# Patient Record
Sex: Female | Born: 1960
Health system: Southern US, Community
[De-identification: ages and names within clinical notes are randomized; demographics above are authoritative.]

## PROBLEM LIST (undated history)

## (undated) ENCOUNTER — Emergency Department (HOSPITAL_COMMUNITY): Admission: EM | Payer: No Typology Code available for payment source | Source: Home / Self Care

## (undated) DIAGNOSIS — I499 Cardiac arrhythmia, unspecified: Secondary | ICD-10-CM

## (undated) DIAGNOSIS — I480 Paroxysmal atrial fibrillation: Secondary | ICD-10-CM

## (undated) DIAGNOSIS — G4733 Obstructive sleep apnea (adult) (pediatric): Secondary | ICD-10-CM

## (undated) DIAGNOSIS — I4819 Other persistent atrial fibrillation: Secondary | ICD-10-CM

## (undated) DIAGNOSIS — Z720 Tobacco use: Secondary | ICD-10-CM

## (undated) DIAGNOSIS — K5792 Diverticulitis of intestine, part unspecified, without perforation or abscess without bleeding: Secondary | ICD-10-CM

## (undated) DIAGNOSIS — Z8616 Personal history of COVID-19: Secondary | ICD-10-CM

## (undated) HISTORY — DX: Other persistent atrial fibrillation: I48.19

## (undated) HISTORY — PX: ABDOMINAL HYSTERECTOMY: SHX81

## (undated) HISTORY — DX: Obstructive sleep apnea (adult) (pediatric): G47.33

## (undated) HISTORY — DX: Diverticulitis of intestine, part unspecified, without perforation or abscess without bleeding: K57.92

---

## 1999-06-07 ENCOUNTER — Emergency Department (HOSPITAL_COMMUNITY): Admission: EM | Admit: 1999-06-07 | Discharge: 1999-06-07 | Payer: Self-pay | Admitting: Emergency Medicine

## 2001-03-28 ENCOUNTER — Encounter: Payer: Self-pay | Admitting: Family Medicine

## 2001-03-28 ENCOUNTER — Ambulatory Visit (HOSPITAL_COMMUNITY): Admission: RE | Admit: 2001-03-28 | Discharge: 2001-03-28 | Payer: Self-pay | Admitting: Family Medicine

## 2004-01-15 ENCOUNTER — Emergency Department (HOSPITAL_COMMUNITY): Admission: EM | Admit: 2004-01-15 | Discharge: 2004-01-15 | Payer: Self-pay | Admitting: Emergency Medicine

## 2004-12-05 ENCOUNTER — Emergency Department (HOSPITAL_COMMUNITY): Admission: EM | Admit: 2004-12-05 | Discharge: 2004-12-05 | Payer: Self-pay | Admitting: Emergency Medicine

## 2007-10-22 ENCOUNTER — Emergency Department (HOSPITAL_COMMUNITY): Admission: EM | Admit: 2007-10-22 | Discharge: 2007-10-22 | Payer: Self-pay | Admitting: Emergency Medicine

## 2008-02-12 ENCOUNTER — Emergency Department (HOSPITAL_COMMUNITY): Admission: EM | Admit: 2008-02-12 | Discharge: 2008-02-12 | Payer: Self-pay | Admitting: Family Medicine

## 2011-02-16 LAB — CULTURE, ROUTINE-ABSCESS: Gram Stain: NONE SEEN

## 2011-06-12 ENCOUNTER — Encounter (HOSPITAL_COMMUNITY): Payer: Self-pay

## 2011-06-12 ENCOUNTER — Emergency Department (HOSPITAL_COMMUNITY)
Admission: EM | Admit: 2011-06-12 | Discharge: 2011-06-12 | Disposition: A | Discharge status: Home / Self Care | Payer: Self-pay | Attending: Emergency Medicine | Admitting: Emergency Medicine

## 2011-06-12 DIAGNOSIS — H9209 Otalgia, unspecified ear: Secondary | ICD-10-CM | POA: Insufficient documentation

## 2011-06-12 DIAGNOSIS — R599 Enlarged lymph nodes, unspecified: Secondary | ICD-10-CM | POA: Insufficient documentation

## 2011-06-12 DIAGNOSIS — H9190 Unspecified hearing loss, unspecified ear: Secondary | ICD-10-CM | POA: Insufficient documentation

## 2011-06-12 NOTE — ED Notes (Signed)
Pt reports right ear is stopped up after using a q-tip last night. Reports used OTC rinse to try to unclog ear, but had no relief. Reports headache and pain in right ear.

## 2011-06-12 NOTE — ED Provider Notes (Signed)
History     CSN: [id number]  Arrival date & time 06/12/11  1237   First MD Initiated Contact with Patient 06/12/11 1303      Chief Complaint  Patient presents with  . Ear Fullness    (Consider location/radiation/quality/duration/timing/severity/associated sxs/prior treatment) Patient is a 51 y.o. female presenting with plugged ear sensation. The history is provided by the patient. No language interpreter was used.  Ear Fullness This is a new problem. The current episode started yesterday. Associated symptoms include swollen glands. Pertinent negatives include no nausea, vertigo or vomiting.   S. report she was cleaning her ear with Q-tip last night and all of a sudden the right ear became clogged in her hearing is decreased. Denies pain fever or drainage or vertigo.  History reviewed. No pertinent past medical history.  Past Surgical History  Procedure Date  . Abdominal hysterectomy     Family History  Problem Relation Age of Onset  . Cancer Mother   . Heart failure Mother   . Cancer Father   . Heart failure Father     History  Substance Use Topics  . Smoking status: Current Everyday Smoker  . Smokeless tobacco: Not on file  . Alcohol Use: No    OB History    Grav Para Term Preterm Abortions TAB SAB Ect Mult Living                  Review of Systems  Gastrointestinal: Negative for nausea and vomiting.  Neurological: Negative for vertigo.  All other systems reviewed and are negative.    Allergies  Review of patient's allergies indicates no known allergies.  Home Medications  No current outpatient prescriptions on file.  BP 123/69  Pulse 69  Temp(Src) 97.8 F (36.6 C) (Oral)  Resp 14  SpO2 98%  Physical Exam  Nursing note and vitals reviewed. Constitutional: She is oriented to person, place, and time. She appears well-developed and well-nourished.  HENT:  Head: Normocephalic and atraumatic.  Right Ear: No drainage, swelling or tenderness. No  foreign bodies. No mastoid tenderness. Tympanic membrane is not perforated. No middle ear effusion. Decreased hearing is noted.  Left Ear: Tympanic membrane normal.  Eyes: Conjunctivae and EOM are normal. Pupils are equal, round, and reactive to light.  Neck: Normal range of motion. Neck supple.  Cardiovascular: Normal rate, regular rhythm, normal heart sounds and intact distal pulses.  Exam reveals no gallop and no friction rub.   No murmur heard. Pulmonary/Chest: Effort normal and breath sounds normal.  Abdominal: Soft. Bowel sounds are normal.  Musculoskeletal: Normal range of motion. She exhibits no edema and no tenderness.  Neurological: She is alert and oriented to person, place, and time. She has normal reflexes.  Skin: Skin is warm and dry.  Psychiatric: She has a normal mood and affect.    ED Course  Procedures (including critical care time)  Labs Reviewed - No data to display No results found.   No diagnosis found.    MDM  Cleaning her ear with q-tip last night and lost her hearing. NO drainage, fever, or tenderness.  Wax was removed from the ear in the ER and her hearing was restored.  Did not fully visualize the R TM but the fact that her hearing is restored I doubt there is a punctured TM.         Jethro Bastos, NP 06/13/11 [phone]

## 2011-06-12 NOTE — ED Notes (Signed)
Patient presents with right ear "clogged" since last night after inserting a q-tip.  Patient also reporting mild right ear pain.

## 2011-06-13 NOTE — ED Provider Notes (Signed)
Medical screening examination/treatment/procedure(s) were performed by non-physician practitioner and as supervising physician I was immediately available for consultation/collaboration.  Doug Sou, MD 06/13/11 [phone]

## 2012-03-21 ENCOUNTER — Emergency Department (HOSPITAL_COMMUNITY): Payer: PRIVATE HEALTH INSURANCE

## 2012-03-21 ENCOUNTER — Emergency Department (HOSPITAL_COMMUNITY)
Admission: EM | Admit: 2012-03-21 | Discharge: 2012-03-21 | Disposition: A | Discharge status: Home / Self Care | Payer: PRIVATE HEALTH INSURANCE | Attending: Emergency Medicine | Admitting: Emergency Medicine

## 2012-03-21 ENCOUNTER — Encounter (HOSPITAL_COMMUNITY): Payer: Self-pay | Admitting: Emergency Medicine

## 2012-03-21 DIAGNOSIS — F17200 Nicotine dependence, unspecified, uncomplicated: Secondary | ICD-10-CM | POA: Insufficient documentation

## 2012-03-21 DIAGNOSIS — M25559 Pain in unspecified hip: Secondary | ICD-10-CM | POA: Insufficient documentation

## 2012-03-21 MED ORDER — OXYCODONE-ACETAMINOPHEN 5-325 MG PO TABS: 2.0000 | ORAL_TABLET | ORAL | Status: DC | PRN

## 2012-03-21 MED ORDER — OXYCODONE-ACETAMINOPHEN 5-325 MG PO TABS
1.0000 | ORAL_TABLET | Freq: Once | ORAL | Status: AC
  Administered 2012-03-21: 1 via ORAL
  Filled 2012-03-21: qty 1

## 2012-03-21 MED ORDER — MELOXICAM 15 MG PO TABS: 15.0000 mg | ORAL_TABLET | Freq: Every day | ORAL | Status: DC

## 2012-03-21 NOTE — ED Provider Notes (Signed)
History     CSN: [id number]  Arrival date & time 03/21/12  1200   None     Chief Complaint  Patient presents with  . Hip Pain    (Consider location/radiation/quality/duration/timing/severity/associated sxs/prior treatment) Patient is a 51 y.o. female presenting with hip pain. The history is provided by the patient. No language interpreter was used.  Hip Pain This is a chronic problem. The current episode started more than 1 month ago. The problem occurs daily. The problem has been gradually worsening. Pertinent negatives include no nausea or vomiting. The symptoms are aggravated by walking. She has tried NSAIDs and acetaminophen for the symptoms. The treatment provided mild relief.   51 year old female coming in with right hip pain that radiates to her right thigh. Patient states that she has had this pain for over a month. States the pain is worse at the end of this day and she's been working and standing on all day. Face is sitting down makes the pain better. States she gets mild relief from Tylenol and Motrin. No acute distress noted. Patient walking without a limp. Assessment medical history listed below. States she has insurance now so she needs a PCP provider list.   History reviewed. No pertinent past medical history.  Past Surgical History  Procedure Date  . Abdominal hysterectomy     Family History  Problem Relation Age of Onset  . Cancer Mother   . Heart failure Mother   . Cancer Father   . Heart failure Father     History  Substance Use Topics  . Smoking status: Current Every Day Smoker    Types: Cigarettes  . Smokeless tobacco: Not on file  . Alcohol Use: No    OB History    Grav Para Term Preterm Abortions TAB SAB Ect Mult Living                  Review of Systems  Constitutional: Negative.   HENT: Negative.   Eyes: Negative.   Respiratory: Negative.   Cardiovascular: Negative.   Gastrointestinal: Negative.  Negative for nausea and vomiting.    Musculoskeletal:       R posterior hip pain that radiates to R thigh intermittant  Neurological: Negative.   Psychiatric/Behavioral: Negative.   All other systems reviewed and are negative.    Allergies  Review of patient's allergies indicates no known allergies.  Home Medications   Current Outpatient Rx  Name  Route  Sig  Dispense  Refill  . IBUPROFEN 800 MG PO TABS   Oral   Take 800 mg by mouth every 8 (eight) hours as needed. pain           BP 132/81  Pulse 65  Temp 98.5 F (36.9 C) (Oral)  Resp 16  Ht 5\' 8"  (1.727 m)  Wt 240 lb (108.863 kg)  BMI 36.49 kg/m2  SpO2 99%  Physical Exam  Nursing note and vitals reviewed. Constitutional: She is oriented to person, place, and time. She appears well-developed and well-nourished.  HENT:  Head: Normocephalic and atraumatic.  Eyes: Conjunctivae normal and EOM are normal. Pupils are equal, round, and reactive to light.  Neck: Normal range of motion. Neck supple.  Cardiovascular: Normal rate, regular rhythm, normal heart sounds and intact distal pulses.  Exam reveals no gallop and no friction rub.   No murmur heard. Pulmonary/Chest: Effort normal and breath sounds normal.  Abdominal: Soft. Bowel sounds are normal.  Musculoskeletal: Normal range of motion. She exhibits no edema  and no tenderness.       Pain with palpitation to the right posterior hip and right lateral hip. Causes CMS below the pain patient walking without a Limp  Neurological: She is alert and oriented to person, place, and time. She has normal reflexes.  Skin: Skin is warm and dry.  Psychiatric: She has a normal mood and affect.    ED Course  Procedures (including critical care time)  Labs Reviewed - No data to display Dg Hip Complete Right  03/21/2012  *RADIOLOGY REPORT*  Clinical Data: Right hip pain radiating to knee  RIGHT HIP - COMPLETE 2+ VIEW  Comparison: None  Findings: Osseous demineralization. Hip and SI joints symmetric and preserved. No  acute fracture, dislocation, or bone destruction.  IMPRESSION: Osseous demineralization. No acute osseous abnormalities.   Original Report Authenticated By: Ulyses Southward, M.D.    Dg Femur Right  03/21/2012  *RADIOLOGY REPORT*  Clinical Data: 51 year old female right hip pain radiating distally.  RIGHT FEMUR - 2 VIEW  Comparison: None.  Findings: Right femoral head normally located.  Right hip joint space preserved.  Right proximal femur intact.  Visualized right hemi pelvis intact.  Distal right femur intact.  Grossly normal alignment of the right knee.  IMPRESSION: No acute fracture or dislocation identified about the right femur.   Original Report Authenticated By: Erskine Speed, M.D.      No diagnosis found.    MDM  51 yo female with R posterior hip pain with palpatation and fatigue.  Will treat with mobic today and percocet.  Patient will get a pcp to follow up with.  No acute process on hip and femur film reviewed by myself.  Suspect osteoarthritis.           Remi Haggard, NP 03/21/12 1536

## 2012-03-21 NOTE — ED Notes (Signed)
Per [employer], pt was not c/o of RLE pain but is c/o R thigh pain.  Xray order changed.

## 2012-03-21 NOTE — ED Notes (Signed)
Pt presenting to ed with c/o right hip pain x 1 month pt denies injury at this time. Pt states she is having tingling and some numbness and burning in her right hip and leg

## 2012-03-21 NOTE — ED Provider Notes (Signed)
Medical screening examination/treatment/procedure(s) were performed by non-physician practitioner and as supervising physician I was immediately available for consultation/collaboration.   Blanchie Dessert, MD 03/21/12 2303

## 2012-03-21 NOTE — ED Notes (Signed)
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 Pt get radiating pain from right upper buttocks around to front of hip then down right thigh and becomes numb in a circular area.  Pain has been occuring for over a month but has become very severe when standing for more than 15 minutes since this past weekend. Pt has been taking OTC meds for pain and inflammation with no relief.

## 2015-03-31 ENCOUNTER — Emergency Department (HOSPITAL_COMMUNITY): Payer: Self-pay

## 2015-03-31 ENCOUNTER — Emergency Department (HOSPITAL_COMMUNITY)
Admission: EM | Admit: 2015-03-31 | Discharge: 2015-03-31 | Disposition: A | Discharge status: Home / Self Care | Payer: Self-pay | Attending: Physician Assistant | Admitting: Physician Assistant

## 2015-03-31 ENCOUNTER — Encounter (HOSPITAL_COMMUNITY): Payer: Self-pay | Admitting: Emergency Medicine

## 2015-03-31 DIAGNOSIS — J40 Bronchitis, not specified as acute or chronic: Secondary | ICD-10-CM

## 2015-03-31 DIAGNOSIS — F17210 Nicotine dependence, cigarettes, uncomplicated: Secondary | ICD-10-CM | POA: Insufficient documentation

## 2015-03-31 DIAGNOSIS — R5383 Other fatigue: Secondary | ICD-10-CM | POA: Insufficient documentation

## 2015-03-31 DIAGNOSIS — R079 Chest pain, unspecified: Secondary | ICD-10-CM | POA: Insufficient documentation

## 2015-03-31 DIAGNOSIS — J209 Acute bronchitis, unspecified: Secondary | ICD-10-CM | POA: Insufficient documentation

## 2015-03-31 MED ORDER — ALBUTEROL SULFATE HFA 108 (90 BASE) MCG/ACT IN AERS
2.0000 | INHALATION_SPRAY | Freq: Once | RESPIRATORY_TRACT | Status: AC
  Administered 2015-03-31: 2 via RESPIRATORY_TRACT
  Filled 2015-03-31: qty 6.7

## 2015-03-31 MED ORDER — AZITHROMYCIN 250 MG PO TABS: 250.0000 mg | ORAL_TABLET | Freq: Once | ORAL | Status: DC

## 2015-03-31 MED ORDER — AZITHROMYCIN 250 MG PO TABS
500.0000 mg | ORAL_TABLET | Freq: Once | ORAL | Status: AC
  Administered 2015-03-31: 500 mg via ORAL
  Filled 2015-03-31: qty 2

## 2015-03-31 MED ORDER — IPRATROPIUM-ALBUTEROL 0.5-2.5 (3) MG/3ML IN SOLN
3.0000 mL | Freq: Once | RESPIRATORY_TRACT | Status: AC
  Administered 2015-03-31: 3 mL via RESPIRATORY_TRACT
  Filled 2015-03-31: qty 3

## 2015-03-31 NOTE — ED Notes (Signed)
Pt to CXR.

## 2015-03-31 NOTE — ED Provider Notes (Signed)
CSN: [id number]     Arrival date & time 03/31/15  1005 History   First MD Initiated Contact with Patient 03/31/15 1103     Chief Complaint  Patient presents with  . Cough  . Shortness of Breath     (Consider location/radiation/quality/duration/timing/severity/associated sxs/prior Treatment) HPI   Patient is a 54 year old female who is in her usual state of health until 9 days ago when she started with cough. Patient reports no fever. Patient reports she's had cough, congestion. The cough has kept her up at night some. Patient denies any nausea vomiting or diarrhea.  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 She came here to get checked out to make sure that she does not require any antibiotics.  History reviewed. No pertinent past medical history. Past Surgical History  Procedure Laterality Date  . Abdominal hysterectomy     Family History  Problem Relation Age of Onset  . Cancer Mother   . Heart failure Mother   . Cancer Father   . Heart failure Father    Social History  Substance Use Topics  . Smoking status: Current Every Day Smoker    Types: Cigarettes  . Smokeless tobacco: None  . Alcohol Use: No   OB History    No data available     Review of Systems  Constitutional: Positive for fatigue. Negative for activity change.  HENT: Positive for congestion.   Eyes: Negative for discharge.  Respiratory: Positive for cough and chest tightness.   Cardiovascular: Negative for chest pain and palpitations.  Gastrointestinal: Negative for abdominal distention.  Genitourinary: Negative for dysuria.  Musculoskeletal: Negative for joint swelling.  Skin: Negative for rash.  Allergic/Immunologic: Negative for immunocompromised state.  Neurological: Negative for dizziness.  Psychiatric/Behavioral: Negative for agitation.      Allergies  Review of patient's allergies indicates no known allergies.  Home Medications   Prior to  Admission medications   Medication Sig Start Date End Date Taking? Authorizing Provider  acetaminophen (TYLENOL) 500 MG tablet Take 500 mg by mouth every 6 (six) hours as needed for mild pain.   Yes Historical Provider, MD  guaiFENesin (Q-TUSSIN) 100 MG/5ML liquid Take 200 mg by mouth 3 (three) times daily as needed for cough.   Yes Historical Provider, MD  ibuprofen (ADVIL,MOTRIN) 800 MG tablet Take 800 mg by mouth every 8 (eight) hours as needed. pain   Yes Historical Provider, MD  azithromycin (ZITHROMAX Z-PAK) 250 MG tablet Take 1 tablet (250 mg total) by mouth once. 03/31/15   Zeena Starkel Lyn Leah Skora, MD   BP 120/74 mmHg  Pulse 65  Temp(Src) 98.4 F (36.9 C) (Oral)  Resp 16  SpO2 96% Physical Exam  Constitutional: She is oriented to person, place, and time. She appears well-developed and well-nourished.  HENT:  Head: Normocephalic and atraumatic.  Eyes: Conjunctivae are normal. Right eye exhibits no discharge.  Neck: Neck supple.  Cardiovascular: Normal rate, regular rhythm and normal heart sounds.   No murmur heard. Pulmonary/Chest: Effort normal. She has wheezes. She has no rales.  Wheezing and rhonchi on exam.  Abdominal: Soft. She exhibits no distension. There is no tenderness.  Musculoskeletal: Normal range of motion. She exhibits no edema.  Neurological: She is oriented to person, place, and time. No cranial nerve deficit.  Skin: Skin is warm and dry. No rash noted. She is not diaphoretic.  Psychiatric: She has a normal mood and affect. Her behavior is normal.  Nursing note and vitals  reviewed.   ED Course  Procedures (including critical care time) Labs Review Labs Reviewed - No data to display  Imaging Review Dg Chest 2 View  03/31/2015  CLINICAL DATA:  Cough for 9 days with intermittent fever. Initial encounter. EXAM: CHEST  2 VIEW COMPARISON:  PA and lateral chest 01/15/2004. FINDINGS: There is some peribronchial thickening. No focal airspace disease, pneumothorax  or effusion is identified. Heart size is normal. No focal bony abnormality. IMPRESSION: Bronchitic change without focal process. Electronically Signed   By: Inge Rise M.D.   On: 03/31/2015 13:00   I have personally reviewed and evaluated these images and lab results as part of my medical decision-making.   EKG Interpretation None      MDM   Final diagnoses:  Bronchitis    Patient is a 54 year old female presenting with cough for the last 9 days. Patient has diffuse wheezing rhonchi. No shortness of breath, or tachypnea. Patient does not have history of asthma. Given the length of time that she has had this, I would treat bronchitis with antibiotics at this time. We will give her a nebulized treatment here. We'll give her albuterol go home every 4 hours until his acute illness is resolved. Additionally will get chest x-ray to make sure she doesn't have a consolidating pneumonia. Plan to treat with Azithro, albuterol, follow-up with primary care physician.    Marria Mathison Julio Alm, MD 03/31/15 1630

## 2015-03-31 NOTE — Discharge Instructions (Signed)
Upper Respiratory Infection, Adult Most upper respiratory infections (URIs) are a viral infection of the air passages leading to the lungs. A URI affects the nose, throat, and upper air passages. The most common type of URI is nasopharyngitis and is typically referred to as "the common cold." URIs run their course and usually go away on their own. Most of the time, a URI does not require medical attention, but sometimes a bacterial infection in the upper airways can follow a viral infection. This is called a secondary infection. Sinus and middle ear infections are common types of secondary upper respiratory infections. Bacterial pneumonia can also complicate a URI. A URI can worsen asthma and chronic obstructive pulmonary disease (COPD). Sometimes, these complications can require emergency medical care and may be life threatening.  CAUSES Almost all URIs are caused by viruses. A virus is a type of germ and can spread from one person to another.  RISKS FACTORS You may be at risk for a URI if:  1. You smoke.  2. You have chronic heart or lung disease. 3. You have a weakened defense (immune) system.  4. You are very young or very old.  5. You have nasal allergies or asthma. 6. You work in crowded or poorly ventilated areas. 7. You work in health care facilities or schools. SIGNS AND SYMPTOMS  Symptoms typically develop 2-3 days after you come in contact with a cold virus. Most viral URIs last 7-10 days. However, viral URIs from the influenza virus (flu virus) can last 14-18 days and are typically more severe. Symptoms may include:   Runny or stuffy (congested) nose.   Sneezing.   Cough.   Sore throat.   Headache.   Fatigue.   Fever.   Loss of appetite.   Pain in your forehead, behind your eyes, and over your cheekbones (sinus pain).  Muscle aches.  DIAGNOSIS  Your health care provider may diagnose a URI by:  Physical exam.  Tests to check that your symptoms are not  due to another condition such as:  Strep throat.  Sinusitis.  Pneumonia.  Asthma. TREATMENT  A URI goes away on its own with time. It cannot be cured with medicines, but medicines may be prescribed or recommended to relieve symptoms. Medicines may help:  Reduce your fever.  Reduce your cough.  Relieve nasal congestion. HOME CARE INSTRUCTIONS   Take medicines only as directed by your health care provider.   Gargle warm saltwater or take cough drops to comfort your throat as directed by your health care provider.  Use a warm mist humidifier or inhale steam from a shower to increase air moisture. This may make it easier to breathe.  Drink enough fluid to keep your urine clear or pale yellow.   Eat soups and other clear broths and maintain good nutrition.   Rest as needed.   Return to work when your temperature has returned to normal or as your health care provider advises. You may need to stay home longer to avoid infecting others. You can also use a face mask and careful hand washing to prevent spread of the virus.  Increase the usage of your inhaler if you have asthma.   Do not use any tobacco products, including cigarettes, chewing tobacco, or electronic cigarettes. If you need help quitting, ask your health care provider. PREVENTION  The best way to protect yourself from getting a cold is to practice good hygiene.   Avoid oral or hand contact with people with cold  symptoms.   Wash your hands often if contact occurs.  There is no clear evidence that vitamin C, vitamin E, echinacea, or exercise reduces the chance of developing a cold. However, it is always recommended to get plenty of rest, exercise, and practice good nutrition.  SEEK MEDICAL CARE IF:   You are getting worse rather than better.   Your symptoms are not controlled by medicine.   You have chills.  You have worsening shortness of breath.  You have brown or red mucus.  You have yellow or  brown nasal discharge.  You have pain in your face, especially when you bend forward.  You have a fever.  You have swollen neck glands.  You have pain while swallowing.  You have white areas in the back of your throat. SEEK IMMEDIATE MEDICAL CARE IF:   You have severe or persistent:  Headache.  Ear pain.  Sinus pain.  Chest pain.  You have chronic lung disease and any of the following:  Wheezing.  Prolonged cough.  Coughing up blood.  A change in your usual mucus.  You have a stiff neck.  You have changes in your:  Vision.  Hearing.  Thinking.  Mood. MAKE SURE YOU:   Understand these instructions.  Will watch your condition.  Will get help right away if you are not doing well or get worse.   This information is not intended to replace advice given to you by your health care provider. Make sure you discuss any questions you have with your health care provider.   Document Released: 10/28/2000 Document Revised: 09/18/2014 Document Reviewed: 08/09/2013 Elsevier Interactive Patient Education 2016 [place] Dose Inhaler With Spacer Inhaled medicines are the basis of treatment of asthma and other breathing problems. Inhaled medicine can only be effective if used properly. Good technique assures that the medicine reaches the lungs. Your health care provider has asked you to use a spacer with your inhaler to help you take the medicine more effectively. A spacer is a plastic tube with a mouthpiece on one end and an opening that connects to the inhaler on the other end. Metered dose inhalers (MDIs) are used to deliver a variety of inhaled medicines. These include quick relief or rescue medicines (such as bronchodilators) and controller medicines (such as corticosteroids). The medicine is delivered by pushing down on a metal canister to release a set amount of spray. If you are using different kinds of inhalers, use your quick relief medicine to open the  airways 10-15 minutes before using a steroid if instructed to do so by your health care provider. If you are unsure which inhalers to use and the order of using them, ask your health care provider, nurse, or respiratory therapist. HOW TO USE THE INHALER WITH A SPACER 8. Remove cap from inhaler. 9. If you are using the inhaler for the first time, you will need to prime it. Shake the inhaler for 5 seconds and release four puffs into the air, away from your face. Ask your health care provider or pharmacist if you have questions about priming your inhaler. 10. Shake inhaler for 5 seconds before each breath in (inhalation). 11. Place the open end of the spacer onto the mouthpiece of the inhaler. 12. Position the inhaler so that the top of the canister faces up and the spacer mouthpiece faces you. 13. Put your index finger on the top of the medicine canister. Your thumb supports the bottom of the inhaler and the spacer.  14. Breathe out (exhale) normally and as completely as possible. 15. Immediately after exhaling, place the spacer between your teeth and into your mouth. Close your mouth tightly around the spacer. 16. Press the canister down with the index finger to release the medicine. 17. At the same time as the canister is pressed, inhale deeply and slowly until the lungs are completely filled. This should take 4-6 seconds. Keep your tongue down and out of the way. 18. Hold the medicine in your lungs for 5-10 seconds (10 seconds is best). This helps the medicine get into the small airways of your lungs. Exhale. 19. Repeat inhaling deeply through the spacer mouthpiece. Again hold that breath for up to 10 seconds (10 seconds is best). Exhale slowly. If it is difficult to take this second deep breath through the spacer, breathe normally several times through the spacer. Remove the spacer from your mouth. 20. Wait at least 15-30 seconds between puffs. Continue with the above steps until you have taken the  number of puffs your health care provider has ordered. Do not use the inhaler more than your health care provider directs you to. 21. Remove spacer from the inhaler and place cap on inhaler. 22. Follow the directions from your health care provider or the inhaler insert for cleaning the inhaler and spacer. If you are using a steroid inhaler, rinse your mouth with water after your last puff, gargle, and spit out the water. Do not swallow the water. AVOID:  Inhaling before or after starting the spray of medicine. It takes practice to coordinate your breathing with triggering the spray.  Inhaling through the nose (rather than the mouth) when triggering the spray. HOW TO DETERMINE IF YOUR INHALER IS FULL OR NEARLY EMPTY You cannot know when an inhaler is empty by shaking it. A few inhalers are now being made with dose counters. Ask your health care provider for a prescription that has a dose counter if you feel you need that extra help. If your inhaler does not have a counter, ask your health care provider to help you determine the date you need to refill your inhaler. Write the refill date on a calendar or your inhaler canister. Refill your inhaler 7-10 days before it runs out. Be sure to keep an adequate supply of medicine. This includes making sure it is not expired, and you have a spare inhaler.  SEEK MEDICAL CARE IF:   Symptoms are only partially relieved with your inhaler.  You are having trouble using your inhaler.  You experience some increase in phlegm. SEEK IMMEDIATE MEDICAL CARE IF:   You feel little or no relief with your inhalers. You are still wheezing and are feeling shortness of breath or tightness in your chest or both.  You have dizziness, headaches, or fast heart rate.  You have chills, fever, or night sweats.  There is a noticeable increase in phlegm production, or there is blood in the phlegm.   This information is not intended to replace advice given to you by your health  care provider. Make sure you discuss any questions you have with your health care provider.   Document Released: 05/04/2005 Document Revised: 09/18/2014 Document Reviewed: 10/20/2012 Elsevier Interactive Patient Education [employer].

## 2015-03-31 NOTE — ED Notes (Signed)
Pt in from home reporting congestion/cough/SOB for past 9 days.

## 2015-03-31 NOTE — ED Notes (Signed)
EDP at bedside  

## 2016-08-15 ENCOUNTER — Inpatient Hospital Stay (HOSPITAL_COMMUNITY)
Admission: EM | Admit: 2016-08-15 | Discharge: 2016-08-20 | DRG: 417 | Disposition: A | Discharge status: Home / Self Care | Payer: Self-pay | Attending: Internal Medicine | Admitting: Internal Medicine

## 2016-08-15 ENCOUNTER — Emergency Department (HOSPITAL_COMMUNITY): Payer: Self-pay

## 2016-08-15 ENCOUNTER — Encounter (HOSPITAL_COMMUNITY): Payer: Self-pay | Admitting: Emergency Medicine

## 2016-08-15 ENCOUNTER — Inpatient Hospital Stay (HOSPITAL_COMMUNITY): Payer: Self-pay

## 2016-08-15 DIAGNOSIS — R160 Hepatomegaly, not elsewhere classified: Secondary | ICD-10-CM | POA: Diagnosis present

## 2016-08-15 DIAGNOSIS — Z9049 Acquired absence of other specified parts of digestive tract: Secondary | ICD-10-CM

## 2016-08-15 DIAGNOSIS — F17210 Nicotine dependence, cigarettes, uncomplicated: Secondary | ICD-10-CM | POA: Diagnosis present

## 2016-08-15 DIAGNOSIS — Z6835 Body mass index (BMI) 35.0-35.9, adult: Secondary | ICD-10-CM

## 2016-08-15 DIAGNOSIS — Z720 Tobacco use: Secondary | ICD-10-CM

## 2016-08-15 DIAGNOSIS — R52 Pain, unspecified: Secondary | ICD-10-CM

## 2016-08-15 DIAGNOSIS — I480 Paroxysmal atrial fibrillation: Secondary | ICD-10-CM

## 2016-08-15 DIAGNOSIS — K8010 Calculus of gallbladder with chronic cholecystitis without obstruction: Principal | ICD-10-CM | POA: Diagnosis present

## 2016-08-15 DIAGNOSIS — E669 Obesity, unspecified: Secondary | ICD-10-CM | POA: Diagnosis present

## 2016-08-15 DIAGNOSIS — K859 Acute pancreatitis without necrosis or infection, unspecified: Secondary | ICD-10-CM | POA: Diagnosis present

## 2016-08-15 DIAGNOSIS — Z90710 Acquired absence of both cervix and uterus: Secondary | ICD-10-CM

## 2016-08-15 DIAGNOSIS — K851 Biliary acute pancreatitis without necrosis or infection: Secondary | ICD-10-CM | POA: Diagnosis present

## 2016-08-15 LAB — CBC WITH DIFFERENTIAL/PLATELET
BASOS ABS: 0 10*3/uL (ref 0.0–0.1)
Basophils Relative: 0 %
EOS PCT: 1 %
Eosinophils Absolute: 0.1 10*3/uL (ref 0.0–0.7)
HCT: 44.9 % (ref 36.0–46.0)
HEMOGLOBIN: 14.5 g/dL (ref 12.0–15.0)
LYMPHS ABS: 1 10*3/uL (ref 0.7–4.0)
LYMPHS PCT: 9 %
MCH: 28.7 pg (ref 26.0–34.0)
MCHC: 32.3 g/dL (ref 30.0–36.0)
MCV: 88.9 fL (ref 78.0–100.0)
Monocytes Absolute: 0.4 10*3/uL (ref 0.1–1.0)
Monocytes Relative: 4 %
NEUTROS ABS: 8.7 10*3/uL — AB (ref 1.7–7.7)
NEUTROS PCT: 86 %
Platelets: 192 10*3/uL (ref 150–400)
RBC: 5.05 MIL/uL (ref 3.87–5.11)
RDW: 14.6 % (ref 11.5–15.5)
WBC: 10.1 10*3/uL (ref 4.0–10.5)

## 2016-08-15 LAB — COMPREHENSIVE METABOLIC PANEL
ALT: 71 U/L — ABNORMAL HIGH (ref 14–54)
ANION GAP: 7 (ref 5–15)
AST: 134 U/L — ABNORMAL HIGH (ref 15–41)
Albumin: 3.9 g/dL (ref 3.5–5.0)
Alkaline Phosphatase: 139 U/L — ABNORMAL HIGH (ref 38–126)
BILIRUBIN TOTAL: 0.9 mg/dL (ref 0.3–1.2)
BUN: 15 mg/dL (ref 6–20)
CHLORIDE: 109 mmol/L (ref 101–111)
CO2: 23 mmol/L (ref 22–32)
Calcium: 9.2 mg/dL (ref 8.9–10.3)
Creatinine, Ser: 0.78 mg/dL (ref 0.44–1.00)
Glucose, Bld: 115 mg/dL — ABNORMAL HIGH (ref 65–99)
POTASSIUM: 3.9 mmol/L (ref 3.5–5.1)
Sodium: 139 mmol/L (ref 135–145)
TOTAL PROTEIN: 7 g/dL (ref 6.5–8.1)

## 2016-08-15 LAB — URINALYSIS, ROUTINE W REFLEX MICROSCOPIC
BACTERIA UA: NONE SEEN
BILIRUBIN URINE: NEGATIVE
Glucose, UA: NEGATIVE mg/dL
KETONES UR: NEGATIVE mg/dL
LEUKOCYTES UA: NEGATIVE
NITRITE: NEGATIVE
PH: 5 (ref 5.0–8.0)
Protein, ur: NEGATIVE mg/dL
Specific Gravity, Urine: 1.021 (ref 1.005–1.030)

## 2016-08-15 LAB — TROPONIN I: Troponin I: <0.03 ng/mL (ref <0.03)

## 2016-08-15 LAB — MRSA PCR SCREENING: MRSA by PCR: NEGATIVE

## 2016-08-15 LAB — LIPASE, BLOOD: Lipase: 1012 U/L — ABNORMAL HIGH (ref 11–51)

## 2016-08-15 MED ORDER — HYDROMORPHONE HCL 1 MG/ML IJ SOLN
INTRAMUSCULAR | Status: AC
  Filled 2016-08-15: qty 1

## 2016-08-15 MED ORDER — ONDANSETRON HCL 4 MG/2ML IJ SOLN
4.0000 mg | Freq: Four times a day (QID) | INTRAMUSCULAR | Status: DC | PRN
  Administered 2016-08-16: 4 mg via INTRAVENOUS
  Filled 2016-08-15: qty 2

## 2016-08-15 MED ORDER — NICOTINE 14 MG/24HR TD PT24
14.0000 mg | MEDICATED_PATCH | Freq: Every day | TRANSDERMAL | Status: DC
  Administered 2016-08-15 – 2016-08-18 (×4): 14 mg via TRANSDERMAL
  Filled 2016-08-15 (×4): qty 1

## 2016-08-15 MED ORDER — LORAZEPAM 1 MG PO TABS: 1.0000 mg | ORAL_TABLET | Freq: Once | ORAL | Status: DC | PRN

## 2016-08-15 MED ORDER — SODIUM CHLORIDE 0.9 % IV SOLN
INTRAVENOUS | Status: DC
  Administered 2016-08-15: 05:00:00 via INTRAVENOUS

## 2016-08-15 MED ORDER — ONDANSETRON HCL 4 MG/2ML IJ SOLN
4.0000 mg | Freq: Once | INTRAMUSCULAR | Status: AC
  Administered 2016-08-15: 4 mg via INTRAVENOUS
  Filled 2016-08-15: qty 2

## 2016-08-15 MED ORDER — HYDROMORPHONE HCL 4 MG/ML IJ SOLN
1.0000 mg | INTRAMUSCULAR | Status: DC | PRN
  Administered 2016-08-15: 1 mg via INTRAVENOUS
  Filled 2016-08-15: qty 1

## 2016-08-15 MED ORDER — HYDROMORPHONE HCL 1 MG/ML IJ SOLN: 1.0000 mg | INTRAMUSCULAR | Status: DC | PRN

## 2016-08-15 MED ORDER — SODIUM CHLORIDE 0.9 % IV BOLUS (SEPSIS)
1000.0000 mL | Freq: Once | INTRAVENOUS | Status: AC
  Administered 2016-08-15: 1000 mL via INTRAVENOUS

## 2016-08-15 MED ORDER — MUPIROCIN 2 % EX OINT: 1.0000 "application " | TOPICAL_OINTMENT | Freq: Two times a day (BID) | CUTANEOUS | Status: DC

## 2016-08-15 MED ORDER — GADOBENATE DIMEGLUMINE 529 MG/ML IV SOLN
20.0000 mL | Freq: Once | INTRAVENOUS | Status: AC | PRN
  Administered 2016-08-15: 20 mL via INTRAVENOUS

## 2016-08-15 MED ORDER — HYDROMORPHONE HCL 1 MG/ML IJ SOLN
1.0000 mg | INTRAMUSCULAR | Status: DC | PRN
  Administered 2016-08-15 – 2016-08-18 (×7): 1 mg via INTRAVENOUS
  Filled 2016-08-15 (×7): qty 1

## 2016-08-15 MED ORDER — HYDROMORPHONE HCL 1 MG/ML IJ SOLN
1.0000 mg | INTRAMUSCULAR | Status: DC | PRN
  Administered 2016-08-15 (×2): 1 mg via INTRAVENOUS
  Filled 2016-08-15 (×2): qty 1

## 2016-08-15 MED ORDER — ENOXAPARIN SODIUM 40 MG/0.4ML ~~LOC~~ SOLN
40.0000 mg | SUBCUTANEOUS | Status: DC
  Administered 2016-08-15 – 2016-08-16 (×2): 40 mg via SUBCUTANEOUS
  Filled 2016-08-15 (×2): qty 0.4

## 2016-08-15 MED ORDER — FENTANYL CITRATE (PF) 100 MCG/2ML IJ SOLN
100.0000 ug | Freq: Once | INTRAMUSCULAR | Status: AC
  Administered 2016-08-15: 100 ug via INTRAVENOUS
  Filled 2016-08-15: qty 2

## 2016-08-15 MED ORDER — CHLORHEXIDINE GLUCONATE CLOTH 2 % EX PADS
6.0000 | MEDICATED_PAD | Freq: Every day | CUTANEOUS | Status: DC
Start: 2016-08-15 — End: 2016-08-15

## 2016-08-15 MED ORDER — FENTANYL CITRATE (PF) 100 MCG/2ML IJ SOLN
50.0000 ug | Freq: Once | INTRAMUSCULAR | Status: AC
  Administered 2016-08-15: 50 ug via INTRAVENOUS
  Filled 2016-08-15: qty 2

## 2016-08-15 MED ORDER — ONDANSETRON HCL 4 MG PO TABS: 4.0000 mg | ORAL_TABLET | Freq: Four times a day (QID) | ORAL | Status: DC | PRN

## 2016-08-15 MED ORDER — SODIUM CHLORIDE 0.9 % IV SOLN
INTRAVENOUS | Status: DC
  Administered 2016-08-15 – 2016-08-16 (×4): via INTRAVENOUS

## 2016-08-15 NOTE — ED Provider Notes (Signed)
[facility] DEPT Provider Note   CSN: [id number] Arrival date & time: 08/15/16  0014  By signing my name below, I, Tammy Davies, attest that this documentation has been prepared under the direction and in the presence of Tammy Fraise, MD . Electronically Signed: Dolores Davies, Scribe. 08/15/2016. 12:19 AM.  History   Chief Complaint Chief Complaint  Patient presents with  . Abdominal Pain  . Emesis   The history is provided by the patient. No language interpreter was used.  Abdominal Pain   This is a new problem. The current episode started 1 to 2 hours ago. The problem occurs constantly. The problem has not changed since onset.The pain is located in the LUQ and RUQ. The pain is moderate. Associated symptoms include vomiting. Pertinent negatives include fever and dysuria. Nothing aggravates the symptoms. Nothing relieves the symptoms.   HPI Comments:  Tammy Davies is an otherwise healthy 56 y.o. female who presents to the Emergency Department by ambulance complaining of sudden-onset, waxing/waning, upper abdominal pain onset 1-2 hours ago. She states that she was driving to work when her pain began. Pt reports associated diaphoresis and one episode of vomiting. She has tried Psychologist, prison and probation services with no relief. Pt denies any fever, dysuria, CP or back pain. Pshx of partial hysterectomy. Pt notes that her blood pressure has been high today.    PMH - none Soc hx - smoker Past Surgical History:  Procedure Laterality Date  . ABDOMINAL HYSTERECTOMY      OB History    No data available       Home Medications    Prior to Admission medications   Medication Sig Start Date End Date Taking? Authorizing Provider  acetaminophen (TYLENOL) 500 MG tablet Take 500 mg by mouth every 6 (six) hours as needed for mild pain.    Historical Provider, MD  azithromycin (ZITHROMAX Z-PAK) 250 MG tablet Take 1 tablet (250 mg total) by mouth once. 03/31/15   Tammy Lyn Mackuen, MD    guaiFENesin (Q-TUSSIN) 100 MG/5ML liquid Take 200 mg by mouth 3 (three) times daily as needed for cough.    Historical Provider, MD  ibuprofen (ADVIL,MOTRIN) 800 MG tablet Take 800 mg by mouth every 8 (eight) hours as needed. pain    Historical Provider, MD    Family History Family History  Problem Relation Age of Onset  . Cancer Mother   . Heart failure Mother   . Cancer Father   . Heart failure Father     Social History Social History  Substance Use Topics  . Smoking status: Current Every Day Smoker    Types: Cigarettes  . Smokeless tobacco: Not on file  . Alcohol use No     Allergies   Patient has no known allergies.   Review of Systems Review of Systems  Constitutional: Positive for diaphoresis. Negative for fever.  Cardiovascular: Negative for chest pain.  Gastrointestinal: Positive for abdominal pain and vomiting.  Genitourinary: Negative for dysuria.  Musculoskeletal: Negative for back pain.  All other systems reviewed and are negative.    Physical Exam Updated Vital Signs BP (!) 143/73 (BP Location: Left Arm)   Pulse 63   Temp 97.8 F (36.6 C) (Oral)   Resp 18   SpO2 95%   Physical Exam CONSTITUTIONAL: Well developed/well nourished, uncomfortable appearing HEAD: Normocephalic/atraumatic EYES: EOMI/PERRL, no icterus  ENMT: Mucous membranes moist, poor dentition NECK: supple no meningeal signs SPINE/BACK:entire spine nontender CV: S1/S2 noted, no murmurs/rubs/gallops noted LUNGS: Lungs are clear to  auscultation bilaterally, no apparent distress ABDOMEN: soft, moderate RUQ, epigastric and LUQ tenderness, no rebound or guarding, bowel sounds noted throughout abdomen GU:no cva tenderness NEURO: Pt is awake/alert/appropriate, moves all extremitiesx4.  No facial droop.   EXTREMITIES: pulses normal/equal, full ROM SKIN: warm, color normal PSYCH: no abnormalities of mood noted, alert and oriented to situation   ED Treatments / Results  DIAGNOSTIC  STUDIES:  Oxygen Saturation is 95% on RA, normal by my interpretation.    COORDINATION OF CARE:  12:26 AM Discussed treatment plan with pt at bedside which includes imaging and a blood test and pt agreed to plan.  Labs (all labs ordered are listed, but only abnormal results are displayed) Labs Reviewed  COMPREHENSIVE METABOLIC PANEL - Abnormal; Notable for the following:       Result Value   Glucose, Bld 115 (*)    AST 134 (*)    ALT 71 (*)    Alkaline Phosphatase 139 (*)    All other components within normal limits  CBC WITH DIFFERENTIAL/PLATELET - Abnormal; Notable for the following:    Neutro Abs 8.7 (*)    All other components within normal limits  URINALYSIS, ROUTINE W REFLEX MICROSCOPIC - Abnormal; Notable for the following:    Hgb urine dipstick SMALL (*)    Squamous Epithelial / LPF 0-5 (*)    All other components within normal limits  LIPASE, BLOOD - Abnormal; Notable for the following:    Lipase 1,012 (*)    All other components within normal limits  TROPONIN I    EKG  EKG Interpretation  Date/Time:  Saturday August 15 2016 00:38:51 EDT Ventricular Rate:  61 PR Interval:    QRS Duration: 114 QT Interval:  425 QTC Calculation: 429 R Axis:   15 Text Interpretation:  Sinus rhythm Borderline intraventricular conduction delay No previous ECGs available Confirmed by Christy Gentles  MD, Juandiego Kolenovic ([id number]) on 08/15/2016 12:47:17 AM       Radiology US Abdomen Limited Ruq  Result Date: 08/15/2016 CLINICAL DATA:  Acute onset of right upper quadrant abdominal pain and vomiting. Initial encounter. EXAM: US ABDOMEN LIMITED - RIGHT UPPER QUADRANT COMPARISON:  None. FINDINGS: Gallbladder: The gallbladder is filled with stones, with a wall echo shadow sign. No definite gallbladder wall thickening or pericholecystic fluid is seen. No ultrasonographic Murphy's sign is elicited. Common bile duct: Diameter: 0.3 cm, within normal limits in caliber. Liver: A large echogenic lesion is noted at  the right hepatic lobe, measuring 4.6 x 4.2 x 3.8 cm. Though this could reflect an hemangioma or adenoma, malignancy cannot be entirely excluded. Otherwise within normal limits in parenchymal echogenicity. IMPRESSION: 1. Gallbladder filled with stones, with a wall echo shadow sign. No evidence for obstruction or cholecystitis. 2. Large 4.6 cm echogenic lesion at the right hepatic lobe. Though this could reflect an hemangioma or adenoma, malignancy cannot be entirely excluded. Dynamic liver protocol MRI or CT would be helpful for further evaluation. Electronically Signed   By: Garald Balding M.D.   On: 08/15/2016 02:37    Procedures Procedures   Medications Ordered in ED Medications  0.9 %  sodium chloride infusion (not administered)  ondansetron (ZOFRAN) injection 4 mg (4 mg Intravenous Given 08/15/16 0058)  fentaNYL (SUBLIMAZE) injection 50 mcg (50 mcg Intravenous Given 08/15/16 0058)  sodium chloride 0.9 % bolus 1,000 mL (0 mLs Intravenous Stopped 08/15/16 0335)  fentaNYL (SUBLIMAZE) injection 100 mcg (100 mcg Intravenous Given 08/15/16 0226)  fentaNYL (SUBLIMAZE) injection 50 mcg (50 mcg Intravenous Given  08/15/16 0335)  ondansetron (ZOFRAN) injection 4 mg (4 mg Intravenous Given 08/15/16 0335)     Initial Impression / Assessment and Plan / ED Course  I have reviewed the triage vital signs and the nursing notes.  Pertinent labs & imaging results that were available during my care of the patient were reviewed by me and considered in my medical decision making (see chart for details).     Pt monitored in ED for several hrs Noted to have cholelithiasis Also noted to have pancreatitis, potentially gallstone pancreatitis D/w dr Marcello Moores with surgery - she recommends admit to medicine D/w dr danford for admission   Final Clinical Impressions(s) / ED Diagnoses   Final diagnoses:  Pain  Gallstone pancreatitis    New Prescriptions New Prescriptions   No medications on file  I personally  performed the services described in this documentation, which was scribed in my presence. The recorded information has been reviewed and is accurate.        Tammy Fraise, MD 08/15/16 [phone]

## 2016-08-15 NOTE — Progress Notes (Signed)
Patient seen and evaluated, chart reviewed, please see EMR for updated orders. Please see full H&P dictated by admitting physician for same date of service.    1. Pancreatitis: - Presumed gallstone., IV fluids, antiemetics, bowel rest ,  surgical consult noted, Hydromorphone for pain, ondansetron for nausea, GI consult requested, MRCP pending, abdominal ultrasound without acute cholecystitis, follow lipase and LFTs Discussed with Dr. Hassell Done on the floor   2. Liver mass: -Liver MRI/MRCP ordered, per tech MRI  can be completed at same time as MRCP       DVT prophylaxis: Lovenox  Code Status: FULL  Disposition Plan: Anticipate IV fluids, conservative mgmt of gallstone pancreatitis.  Further disposition pending imaging and GI consult.  Admission status: INPATIENT   Patient seen and evaluated, chart reviewed, please see EMR for updated orders. Please see full H&P dictated by admitting physician for same date of service.

## 2016-08-15 NOTE — H&P (Signed)
History and Physical  Patient Name: Tammy Davies     DVV:[id number]    DOB: May 19, 1960    DOA: 08/15/2016 PCP: No PCP Per Patient   Patient coming from: Home  Chief Complaint: Epigastric pain  HPI: Tammy Davies is a 56 y.o. female with no significant past medical history who presents with acute epigastric pain.  The patient was completely in her normal state of health until tonight around 11 PM, as she was driving to work, she developed sudden onset severe epigastric pain "all on the top of my abdomen" along with nausea. She took some Maalox without relief, vomited once, and came to the emergency room.  She does not use alcohol to excess.  She has had no jaundice, weightloss.  She has never had gallstones before.  ED course: -Afebrile, heart rate 63, respirations and pulse is normal, blood pressure 143/73 -Na 139, K 3.9, Cr 0.78, WBC 10.1K, Hgb 14.5 -AST/ALT 134/71 and alkphos 139, TBili normal -[place] RUQ showed gallstone, normal CBD, no evidence of cholecystitis but also a 4cm liver mass -She was given fentanyl with good relief and TRH were asked to evaluate for suspected gallstone pancreatitis     ROS: Review of Systems  Constitutional: Negative for chills and fever.  Gastrointestinal: Positive for abdominal pain, nausea and vomiting.  All other systems reviewed and are negative.         History reviewed. No pertinent past medical history.  Past Surgical History:  Procedure Laterality Date  . ABDOMINAL HYSTERECTOMY      Social History: Patient lives with her son.  The patient walks unassisted.  She is from [place].  She smokes.  She does not use alcohol.  She uses THC occasionally. She works as a Web designer at [employer].    No Known Allergies  Family history: family history includes Breast cancer in her mother; Diabetes in her father; Heart failure in her father and mother.  Prior to Admission medications   Not on File       Physical Exam: BP (!) 113/59    Pulse 74   Temp 97.8 F (36.6 C) (Oral)   Resp 16   SpO2 100%  General appearance: Well-developed, adult female, alert and in no acute distress.   Eyes: Anicteric, conjunctiva pink, lids and lashes normal. PERRL.    ENT: No nasal deformity, discharge, epistaxis.  Hearing normal. OP moist without lesions.  Mostly edentulous. Neck: No neck masses.  Trachea midline.  No thyromegaly/tenderness. Lymph: No cervical or supraclavicular lymphadenopathy. Skin: Warm and dry.  No jaundice.  No suspicious rashes or lesions. Cardiac: RRR, nl S1-S2, no murmurs appreciated.  Capillary refill is brisk.  JVP not visible.  No LE edema.  Radial and DP pulses 2+ and symmetric. Respiratory: Normal respiratory rate and rhythm.  CTAB without rales or wheezes. Abdomen: Abdomen soft.  Marked TTP in upper abdomen, with guarding. No ascites, distension, hepatosplenomegaly appreciated given tenderness guarding.   MSK: No deformities or effusions.  No cyanosis or clubbing. Neuro: Cranial nerves normal.  Sensation intact to light touch. Speech is fluent.  Muscle strength normal.    Psych: Sensorium intact and responding to questions, attention normal.  Behavior appropriate.  Affect normal.  Judgment and insight appear normal.     Labs on Admission:  I have personally reviewed following labs and imaging studies: CBC:  Recent Labs Lab 08/15/16 0101  WBC 10.1  NEUTROABS 8.7*  HGB 14.5  HCT 44.9  MCV 88.9  PLT  416   Basic Metabolic Panel:  Recent Labs Lab 08/15/16 0101  NA 139  K 3.9  CL 109  CO2 23  GLUCOSE 115*  BUN 15  CREATININE 0.78  CALCIUM 9.2   GFR: CrCl cannot be calculated (Unknown ideal weight.).  Liver Function Tests:  Recent Labs Lab 08/15/16 0101  AST 134*  ALT 71*  ALKPHOS 139*  BILITOT 0.9  PROT 7.0  ALBUMIN 3.9    Recent Labs Lab 08/15/16 0307  LIPASE 1,012*   No results for input(s): AMMONIA in the last 168 hours. Coagulation Profile: No results for input(s):  INR, PROTIME in the last 168 hours. Cardiac Enzymes:  Recent Labs Lab 08/15/16 0101  TROPONINI <0.03   BNP (last 3 results) No results for input(s): PROBNP in the last 8760 hours. HbA1C: No results for input(s): HGBA1C in the last 72 hours. CBG: No results for input(s): GLUCAP in the last 168 hours. Lipid Profile: No results for input(s): CHOL, HDL, LDLCALC, TRIG, CHOLHDL, LDLDIRECT in the last 72 hours. Thyroid Function Tests: No results for input(s): TSH, T4TOTAL, FREET4, T3FREE, THYROIDAB in the last 72 hours. Anemia Panel: No results for input(s): VITAMINB12, FOLATE, FERRITIN, TIBC, IRON, RETICCTPCT in the last 72 hours. Sepsis Labs: Invalid input(s): PROCALCITONIN, LACTICIDVEN No results found for this or any previous visit (from the past 240 hour(s)).       Radiological Exams on Admission: Personally reviewed [place] report: US Abdomen Limited Ruq  Result Date: 08/15/2016 CLINICAL DATA:  Acute onset of right upper quadrant abdominal pain and vomiting. Initial encounter. EXAM: US ABDOMEN LIMITED - RIGHT UPPER QUADRANT COMPARISON:  None. FINDINGS: Gallbladder: The gallbladder is filled with stones, with a wall echo shadow sign. No definite gallbladder wall thickening or pericholecystic fluid is seen. No ultrasonographic Murphy's sign is elicited. Common bile duct: Diameter: 0.3 cm, within normal limits in caliber. Liver: A large echogenic lesion is noted at the right hepatic lobe, measuring 4.6 x 4.2 x 3.8 cm. Though this could reflect an hemangioma or adenoma, malignancy cannot be entirely excluded. Otherwise within normal limits in parenchymal echogenicity. IMPRESSION: 1. Gallbladder filled with stones, with a wall echo shadow sign. No evidence for obstruction or cholecystitis. 2. Large 4.6 cm echogenic lesion at the right hepatic lobe. Though this could reflect an hemangioma or adenoma, malignancy cannot be entirely excluded. Dynamic liver protocol MRI or CT would be helpful for  further evaluation. Electronically Signed   By: Garald Balding M.D.   On: 08/15/2016 02:37    EKG: Independently reviewed. Rate 61, QTc 429, no ST changes.    Assessment/Plan  1. Pancreatitis:  Presumed gallstone.  No alcohol involved.   -NPO -MIVF -Hydromorphone for pain, ondansetron for nausea -Consult to GI -MRCP ordered   2. Liver mass:  -Liver MRI ordered, per tech, this can be completed at same time as MRCP       DVT prophylaxis: Lovenox  Code Status: FULL  Family Communication: None presetn  Disposition Plan: Anticipate IV fluids, conservative mgmt of gallstone pancreatitis.  Further disposition pending imaging and GI consult. Consults called: None overnight Admission status: INPATIENT        Medical decision making: Patient seen at 5:24 AM on 08/15/2016.  The patient was discussed with Dr. Christy Gentles.  What exists of the patient's chart was reviewed in depth and summarized above.  Clinical condition: stable.        Edwin Dada [facility] Pager [phone]

## 2016-08-15 NOTE — ED Notes (Signed)
Bed: GO77 Expected date:  Expected time:  Means of arrival:  Comments: 56 yr old abd pain

## 2016-08-15 NOTE — Consult Note (Signed)
Reason for Consult: Gallstone pancreatitis Referring Physician: Dr Lonia Blood is an 56 y.o. female.  HPI: 56 y.o. F with sudden onset of abdominal pain yesterday evening.  She has never had any pain like this before.  It was associated with nausea.  She denies any fevers.    History reviewed. No pertinent past medical history.  Past Surgical History:  Procedure Laterality Date  . ABDOMINAL HYSTERECTOMY      Family History  Problem Relation Age of Onset  . Heart failure Mother   . Breast cancer Mother   . Heart failure Father   . Diabetes Father     Social History:  reports that she has been smoking Cigarettes.  She has never used smokeless tobacco. She reports that she uses drugs, including Marijuana. She reports that she does not drink alcohol.  Allergies: No Known Allergies  Medications: I have reviewed the patient's current medications.  Results for orders placed or performed during the hospital encounter of 08/15/16 (from the past 48 hour(s))  Urinalysis, Routine w reflex microscopic     Status: Abnormal   Collection Time: 08/15/16 12:40 AM  Result Value Ref Range   Color, Urine YELLOW YELLOW   APPearance CLEAR CLEAR   Specific Gravity, Urine 1.021 1.005 - 1.030   pH 5.0 5.0 - 8.0   Glucose, UA NEGATIVE NEGATIVE mg/dL   Hgb urine dipstick SMALL (A) NEGATIVE   Bilirubin Urine NEGATIVE NEGATIVE   Ketones, ur NEGATIVE NEGATIVE mg/dL   Protein, ur NEGATIVE NEGATIVE mg/dL   Nitrite NEGATIVE NEGATIVE   Leukocytes, UA NEGATIVE NEGATIVE   RBC / HPF 0-5 0 - 5 RBC/hpf   WBC, UA 0-5 0 - 5 WBC/hpf   Bacteria, UA NONE SEEN NONE SEEN   Squamous Epithelial / LPF 0-5 (A) NONE SEEN   Mucous PRESENT   Comprehensive metabolic panel     Status: Abnormal   Collection Time: 08/15/16  1:01 AM  Result Value Ref Range   Sodium 139 135 - 145 mmol/L   Potassium 3.9 3.5 - 5.1 mmol/L   Chloride 109 101 - 111 mmol/L   CO2 23 22 - 32 mmol/L   Glucose, Bld 115 (H) 65 - 99  mg/dL   BUN 15 6 - 20 mg/dL   Creatinine, Ser 0.78 0.44 - 1.00 mg/dL   Calcium 9.2 8.9 - 10.3 mg/dL   Total Protein 7.0 6.5 - 8.1 g/dL   Albumin 3.9 3.5 - 5.0 g/dL   AST 134 (H) 15 - 41 U/L   ALT 71 (H) 14 - 54 U/L   Alkaline Phosphatase 139 (H) 38 - 126 U/L   Total Bilirubin 0.9 0.3 - 1.2 mg/dL   GFR calc non Af Amer >60 >60 mL/min   GFR calc Af Amer >60 >60 mL/min    Comment: (NOTE) The eGFR has been calculated using the CKD EPI equation. This calculation has not been validated in all clinical situations. eGFR's persistently <60 mL/min signify possible Chronic Kidney Disease.    Anion gap 7 5 - 15  CBC with Differential/Platelet     Status: Abnormal   Collection Time: 08/15/16  1:01 AM  Result Value Ref Range   WBC 10.1 4.0 - 10.5 K/uL   RBC 5.05 3.87 - 5.11 MIL/uL   Hemoglobin 14.5 12.0 - 15.0 g/dL   HCT 44.9 36.0 - 46.0 %   MCV 88.9 78.0 - 100.0 fL   MCH 28.7 26.0 - 34.0 pg   MCHC 32.3 30.0 -  36.0 g/dL   RDW 14.6 11.5 - 15.5 %   Platelets 192 150 - 400 K/uL   Neutrophils Relative % 86 %   Neutro Abs 8.7 (H) 1.7 - 7.7 K/uL   Lymphocytes Relative 9 %   Lymphs Abs 1.0 0.7 - 4.0 K/uL   Monocytes Relative 4 %   Monocytes Absolute 0.4 0.1 - 1.0 K/uL   Eosinophils Relative 1 %   Eosinophils Absolute 0.1 0.0 - 0.7 K/uL   Basophils Relative 0 %   Basophils Absolute 0.0 0.0 - 0.1 K/uL  Troponin I     Status: None   Collection Time: 08/15/16  1:01 AM  Result Value Ref Range   Troponin I <0.03 <0.03 ng/mL  Lipase, blood     Status: Abnormal   Collection Time: 08/15/16  3:07 AM  Result Value Ref Range   Lipase 1,012 (H) 11 - 51 U/L    Comment: RESULTS CONFIRMED BY MANUAL DILUTION    Mr 3d Recon At Scanner  Result Date: 08/15/2016 CLINICAL DATA:  4.6 cm echogenic mass in the liver on a recent limited abdomen ultrasound. EXAM: MRI ABDOMEN WITHOUT AND WITH CONTRAST (INCLUDING MRCP) TECHNIQUE: Multiplanar multisequence MR imaging of the abdomen was performed both before and  after the administration of intravenous contrast. Heavily T2-weighted images of the biliary and pancreatic ducts were obtained, and three-dimensional MRCP images were rendered by post processing. CONTRAST:  54m MULTIHANCE GADOBENATE DIMEGLUMINE 529 MG/ML IV SOLN COMPARISON:  Limited abdomen ultrasound dated 08/15/2016. FINDINGS: Lower chest: Minimal right basilar atelectasis. Hepatobiliary: Diffuse low signal intensity of the liver relative to the spleen on fat suppression images. Triangular-shaped mass in the right lobe of the liver measuring 7.6 x 6.8 x 6.5 cm in maximum dimensions. This has homogeneous increased signal intensity on T2 and weighted images and homogeneously decreased signal intensity on precontrast T1 weighted images. This demonstrates peripheral contrast puddling on the postcontrast images. This corresponds to the mass seen at ultrasound. Multiple large stones are filling the gallbladder. The largest stone measures 2.3 cm in maximum diameter. No gallbladder wall thickening or pericholecystic fluid. Pancreas:  Normal.  No mass demonstrated. Spleen:  Normal in size, shape and signal intensity. Adrenals/Urinary Tract: Normal appearing adrenal glands, kidneys and included portions of the ureters. Stomach/Bowel: The stomach and included portions of the small bowel and colon appear normal. Vascular/Lymphatic: Normal appearing aorta and its branches. No enlarged lymph nodes. Other:  None. Musculoskeletal: Unremarkable. IMPRESSION: 1. 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 2. Diffuse hepatic steatosis. 3. Multiple large gallstones filling the gallbladder without evidence of cholecystitis. The largest stone measures 2.3 cm. Electronically Signed   By: SClaudie ReveringM.D.   On: 08/15/2016 08:46   Mr Abdomen Mrcp WMoise BoringContast  Result Date: 08/15/2016 CLINICAL DATA:  4.6 cm echogenic mass in the liver on a recent limited abdomen ultrasound. EXAM: MRI ABDOMEN WITHOUT AND WITH CONTRAST (INCLUDING MRCP)  TECHNIQUE: Multiplanar multisequence MR imaging of the abdomen was performed both before and after the administration of intravenous contrast. Heavily T2-weighted images of the biliary and pancreatic ducts were obtained, and three-dimensional MRCP images were rendered by post processing. CONTRAST:  279mMULTIHANCE GADOBENATE DIMEGLUMINE 529 MG/ML IV SOLN COMPARISON:  Limited abdomen ultrasound dated 08/15/2016. FINDINGS: Lower chest: Minimal right basilar atelectasis. Hepatobiliary: Diffuse low signal intensity of the liver relative to the spleen on fat suppression images. Triangular-shaped mass in the right lobe of the liver measuring 7.6 x 6.8 x 6.5 cm in maximum dimensions. This  has homogeneous increased signal intensity on T2 and weighted images and homogeneously decreased signal intensity on precontrast T1 weighted images. This demonstrates peripheral contrast puddling on the postcontrast images. This corresponds to the mass seen at ultrasound. Multiple large stones are filling the gallbladder. The largest stone measures 2.3 cm in maximum diameter. No gallbladder wall thickening or pericholecystic fluid. Pancreas:  Normal.  No mass demonstrated. Spleen:  Normal in size, shape and signal intensity. Adrenals/Urinary Tract: Normal appearing adrenal glands, kidneys and included portions of the ureters. Stomach/Bowel: The stomach and included portions of the small bowel and colon appear normal. Vascular/Lymphatic: Normal appearing aorta and its branches. No enlarged lymph nodes. Other:  None. Musculoskeletal: Unremarkable. IMPRESSION: 1. 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 2. Diffuse hepatic steatosis. 3. Multiple large gallstones filling the gallbladder without evidence of cholecystitis. The largest stone measures 2.3 cm. Electronically Signed   By: Claudie Revering M.D.   On: 08/15/2016 08:46   US Abdomen Limited Ruq  Result Date: 08/15/2016 CLINICAL DATA:  Acute onset of right upper quadrant abdominal pain and  vomiting. Initial encounter. EXAM: US ABDOMEN LIMITED - RIGHT UPPER QUADRANT COMPARISON:  None. FINDINGS: Gallbladder: The gallbladder is filled with stones, with a wall echo shadow sign. No definite gallbladder wall thickening or pericholecystic fluid is seen. No ultrasonographic Murphy's sign is elicited. Common bile duct: Diameter: 0.3 cm, within normal limits in caliber. Liver: A large echogenic lesion is noted at the right hepatic lobe, measuring 4.6 x 4.2 x 3.8 cm. Though this could reflect an hemangioma or adenoma, malignancy cannot be entirely excluded. Otherwise within normal limits in parenchymal echogenicity. IMPRESSION: 1. Gallbladder filled with stones, with a wall echo shadow sign. No evidence for obstruction or cholecystitis. 2. Large 4.6 cm echogenic lesion at the right hepatic lobe. Though this could reflect an hemangioma or adenoma, malignancy cannot be entirely excluded. Dynamic liver protocol MRI or CT would be helpful for further evaluation. Electronically Signed   By: Garald Balding M.D.   On: 08/15/2016 02:37    Review of Systems  Constitutional: Negative for chills and fever.  Eyes: Negative for blurred vision and double vision.  Respiratory: Negative for cough and shortness of breath.   Cardiovascular: Negative for chest pain and palpitations.  Gastrointestinal: Positive for abdominal pain and nausea. Negative for blood in stool, constipation, diarrhea and vomiting.  Genitourinary: Negative for dysuria, frequency and urgency.  Musculoskeletal: Negative for joint pain and myalgias.  Skin: Negative for itching and rash.  Neurological: Negative for dizziness and headaches.   Blood pressure 132/72, pulse 63, temperature 98.1 F (36.7 C), temperature source Oral, resp. rate 17, height 5' 8"  (1.727 m), weight 106.6 kg (235 lb), SpO2 96 %. Physical Exam  Constitutional: She is oriented to person, place, and time. She appears well-developed and well-nourished. No distress.  HENT:   Head: Normocephalic and atraumatic.  Eyes: Conjunctivae and EOM are normal. Pupils are equal, round, and reactive to light.  Neck: Normal range of motion.  Cardiovascular: Normal rate and regular rhythm.   Respiratory: Effort normal. No respiratory distress.  GI: She exhibits no distension. There is tenderness (mid-epigastric).  Musculoskeletal: Normal range of motion.  Neurological: She is alert and oriented to person, place, and time.  Skin: Skin is warm and dry. She is not diaphoretic.    Assessment/Plan: Gallstone pancreatitis.  No sign of biliary obstruction currently.  US shows no signs of cholecystitis.  Cont NPO today.  Recheck lipase in AM.  Marche Hottenstein C.  08/15/2016, 9:09 AM

## 2016-08-15 NOTE — ED Triage Notes (Signed)
Pt from home via EMS complaining of sudden abd pain starting about 45 min 2330.  Tenderness to LLQ, hx hysterectomy.  Last ate 4 pm.  Dull & deep pain, 8/10, nausea w/vomiting x1.  Denies diarrhea or urinary changes. 4 mg zofran and 100 mcg fentanyl given EMS VS: EKG NSR, 116/89, 60 bpm, 94% RA, 16 RR

## 2016-08-16 LAB — CBC
HCT: 38.4 % (ref 36.0–46.0)
Hemoglobin: 12.3 g/dL (ref 12.0–15.0)
MCH: 29.1 pg (ref 26.0–34.0)
MCHC: 32 g/dL (ref 30.0–36.0)
MCV: 90.8 fL (ref 78.0–100.0)
PLATELETS: 173 10*3/uL (ref 150–400)
RBC: 4.23 MIL/uL (ref 3.87–5.11)
RDW: 14.8 % (ref 11.5–15.5)
WBC: 6.6 10*3/uL (ref 4.0–10.5)

## 2016-08-16 LAB — COMPREHENSIVE METABOLIC PANEL
ALT: 256 U/L — AB (ref 14–54)
AST: 161 U/L — AB (ref 15–41)
Albumin: 3.4 g/dL — ABNORMAL LOW (ref 3.5–5.0)
Alkaline Phosphatase: 144 U/L — ABNORMAL HIGH (ref 38–126)
Anion gap: 3 — ABNORMAL LOW (ref 5–15)
BUN: 10 mg/dL (ref 6–20)
CHLORIDE: 108 mmol/L (ref 101–111)
CO2: 26 mmol/L (ref 22–32)
CREATININE: 0.77 mg/dL (ref 0.44–1.00)
Calcium: 8.4 mg/dL — ABNORMAL LOW (ref 8.9–10.3)
GFR calc Af Amer: >60 mL/min (ref >60)
GLUCOSE: 85 mg/dL (ref 65–99)
Potassium: 4.8 mmol/L (ref 3.5–5.1)
SODIUM: 137 mmol/L (ref 135–145)
Total Bilirubin: 0.8 mg/dL (ref 0.3–1.2)
Total Protein: 6.3 g/dL — ABNORMAL LOW (ref 6.5–8.1)

## 2016-08-16 LAB — LIPASE, BLOOD: Lipase: 66 U/L — ABNORMAL HIGH (ref 11–51)

## 2016-08-16 LAB — HIV ANTIBODY (ROUTINE TESTING W REFLEX): HIV Screen 4th Generation wRfx: NONREACTIVE

## 2016-08-16 MED ORDER — DEXTROSE-NACL 5-0.45 % IV SOLN
INTRAVENOUS | Status: DC
  Administered 2016-08-16: 18:00:00 via INTRAVENOUS
  Administered 2016-08-17: 1000 mL via INTRAVENOUS
  Administered 2016-08-18: 09:00:00 via INTRAVENOUS

## 2016-08-16 MED ORDER — ACETAMINOPHEN 325 MG PO TABS: 325.0000 mg | ORAL_TABLET | Freq: Four times a day (QID) | ORAL | Status: DC | PRN

## 2016-08-16 NOTE — Progress Notes (Signed)
Patient Demographics:    Tammy Davies, is a 56 y.o. female, DOB - 1960/06/26, PVV:[id number]  Admit date - 08/15/2016   Admitting Physician Edwin Dada, MD  Outpatient Primary MD for the patient is No PCP Per Patient  LOS - 1   Chief Complaint  Patient presents with  . Abdominal Pain  . Emesis        Subjective:    Tammy Davies today has no fevers, no emesis,  No chest pain,  Tolerating clear liquid diet   Assessment  & Plan :    Principal Problem:   Pancreatitis Active Problems:   Liver mass, right lobe   1. Pancreatitis:- Presumed gallstone, Improved abdominal pain, no further emesis, c/n IV fluids, antiemetics,  surgical consult noted, Hydromorphone for pain, ondansetron for nausea, GI consult requested,   abdominal ultrasound without acute cholecystitis Discussed with Dr. Hassell Done, for lap chole on 08/17/16    2. 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 enoxaparin (LOVENOX) injection  40 mg Subcutaneous Q24H  . nicotine  14 mg Transdermal Daily   Continuous Infusions: . sodium chloride 75 mL/hr at 08/16/16 0844   PRN Meds:.acetaminophen, HYDROmorphone (DILAUDID) injection, LORazepam, ondansetron **OR** ondansetron (ZOFRAN) IV    Anti-infectives    None        Objective:   Vitals:   08/15/16 1438 08/15/16 2226 08/16/16 0433 08/16/16 1428  BP: (!) 98/48 123/67 140/70 130/65  Pulse: (!) 54 68 (!) 56 60  Resp: 14 16 16 16   Temp: 98.4 F (36.9 C) 98.5 F (36.9 C) 99 F (37.2 C) 98.8 F (37.1 C)  TempSrc: Oral Oral Oral Oral  SpO2: 96% 95% 92% 96%  Weight:      Height:        Wt  Readings from Last 3 Encounters:  08/15/16 106.6 kg (235 lb)  03/21/12 108.9 kg (240 lb)     Intake/Output Summary (Last 24 hours) at 08/16/16 1749 Last data filed at 08/16/16 1428  Gross per 24 hour  Intake          2576.67 ml  Output             1800 ml  Net           776.67 ml     Physical Exam  Gen:- Awake Alert,  In no apparent distress  HEENT:- [place].AT, No sclera icterus Neck-Supple Neck,No JVD,.  Lungs-  CTAB  CV- S1, S2 normal Abd-  +ve B.Sounds, Abd Soft, much less tenderness,    Extremity/Skin:- No  edema,       Data Review:   Micro Results Recent Results (from the past 240 hour(s))  MRSA PCR Screening     Status: None   Collection Time: 08/15/16  6:40 AM  Result Value Ref Range Status   MRSA by PCR NEGATIVE NEGATIVE Final    Comment:  The GeneXpert MRSA Assay (FDA approved for NASAL specimens only), is one component of a comprehensive MRSA colonization surveillance program. It is not intended to diagnose MRSA infection nor to guide or monitor treatment for MRSA infections.     Radiology Reports Mr 3d Recon At Scanner  Result Date: 08/15/2016 CLINICAL DATA:  4.6 cm echogenic mass in the liver on a recent limited abdomen ultrasound. EXAM: MRI ABDOMEN WITHOUT AND WITH CONTRAST (INCLUDING MRCP) TECHNIQUE: Multiplanar multisequence MR imaging of the abdomen was performed both before and after the administration of intravenous contrast. Heavily T2-weighted images of the biliary and pancreatic ducts were obtained, and three-dimensional MRCP images were rendered by post processing. CONTRAST:  62m MULTIHANCE GADOBENATE DIMEGLUMINE 529 MG/ML IV SOLN COMPARISON:  Limited abdomen ultrasound dated 08/15/2016. FINDINGS: Lower chest: Minimal right basilar atelectasis. Hepatobiliary: Diffuse low signal intensity of the liver relative to the spleen on fat suppression images. Triangular-shaped mass in the right lobe of the liver measuring 7.6 x 6.8 x 6.5 cm in maximum  dimensions. This has homogeneous increased signal intensity on T2 and weighted images and homogeneously decreased signal intensity on precontrast T1 weighted images. This demonstrates peripheral contrast puddling on the postcontrast images. This corresponds to the mass seen at ultrasound. Multiple large stones are filling the gallbladder. The largest stone measures 2.3 cm in maximum diameter. No gallbladder wall thickening or pericholecystic fluid. Pancreas:  Normal.  No mass demonstrated. Spleen:  Normal in size, shape and signal intensity. Adrenals/Urinary Tract: Normal appearing adrenal glands, kidneys and included portions of the ureters. Stomach/Bowel: The stomach and included portions of the small bowel and colon appear normal. Vascular/Lymphatic: Normal appearing aorta and its branches. No enlarged lymph nodes. Other:  None. Musculoskeletal: Unremarkable. IMPRESSION: 1. 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 2. Diffuse hepatic steatosis. 3. Multiple large gallstones filling the gallbladder without evidence of cholecystitis. The largest stone measures 2.3 cm. Electronically Signed   By: SClaudie ReveringM.D.   On: 08/15/2016 08:46   Mr Abdomen Mrcp WMoise BoringContast  Result Date: 08/15/2016 CLINICAL DATA:  4.6 cm echogenic mass in the liver on a recent limited abdomen ultrasound. EXAM: MRI ABDOMEN WITHOUT AND WITH CONTRAST (INCLUDING MRCP) TECHNIQUE: Multiplanar multisequence MR imaging of the abdomen was performed both before and after the administration of intravenous contrast. Heavily T2-weighted images of the biliary and pancreatic ducts were obtained, and three-dimensional MRCP images were rendered by post processing. CONTRAST:  236mMULTIHANCE GADOBENATE DIMEGLUMINE 529 MG/ML IV SOLN COMPARISON:  Limited abdomen ultrasound dated 08/15/2016. FINDINGS: Lower chest: Minimal right basilar atelectasis. Hepatobiliary: Diffuse low signal intensity of the liver relative to the spleen on fat suppression images.  Triangular-shaped mass in the right lobe of the liver measuring 7.6 x 6.8 x 6.5 cm in maximum dimensions. This has homogeneous increased signal intensity on T2 and weighted images and homogeneously decreased signal intensity on precontrast T1 weighted images. This demonstrates peripheral contrast puddling on the postcontrast images. This corresponds to the mass seen at ultrasound. Multiple large stones are filling the gallbladder. The largest stone measures 2.3 cm in maximum diameter. No gallbladder wall thickening or pericholecystic fluid. Pancreas:  Normal.  No mass demonstrated. Spleen:  Normal in size, shape and signal intensity. Adrenals/Urinary Tract: Normal appearing adrenal glands, kidneys and included portions of the ureters. Stomach/Bowel: The stomach and included portions of the small bowel and colon appear normal. Vascular/Lymphatic: Normal appearing aorta and its branches. No enlarged lymph nodes. Other:  None. Musculoskeletal: Unremarkable. IMPRESSION: 1. 7.6 cm right  lobe liver hemangioma. 2. Diffuse hepatic steatosis. 3. Multiple large gallstones filling the gallbladder without evidence of cholecystitis. The largest stone measures 2.3 cm. Electronically Signed   By: Claudie Revering M.D.   On: 08/15/2016 08:46   US Abdomen Limited Ruq  Result Date: 08/15/2016 CLINICAL DATA:  Acute onset of right upper quadrant abdominal pain and vomiting. Initial encounter. EXAM: US ABDOMEN LIMITED - RIGHT UPPER QUADRANT COMPARISON:  None. FINDINGS: Gallbladder: The gallbladder is filled with stones, with a wall echo shadow sign. No definite gallbladder wall thickening or pericholecystic fluid is seen. No ultrasonographic Murphy's sign is elicited. Common bile duct: Diameter: 0.3 cm, within normal limits in caliber. Liver: A large echogenic lesion is noted at the right hepatic lobe, measuring 4.6 x 4.2 x 3.8 cm. Though this could reflect an hemangioma or adenoma, malignancy cannot be entirely excluded. Otherwise  within normal limits in parenchymal echogenicity. IMPRESSION: 1. Gallbladder filled with stones, with a wall echo shadow sign. No evidence for obstruction or cholecystitis. 2. Large 4.6 cm echogenic lesion at the right hepatic lobe. Though this could reflect an hemangioma or adenoma, malignancy cannot be entirely excluded. Dynamic liver protocol MRI or CT would be helpful for further evaluation. Electronically Signed   By: Garald Balding M.D.   On: 08/15/2016 02:37     CBC  Recent Labs Lab 08/15/16 0101 08/16/16 0523  WBC 10.1 6.6  HGB 14.5 12.3  HCT 44.9 38.4  PLT 192 173  MCV 88.9 90.8  MCH 28.7 29.1  MCHC 32.3 32.0  RDW 14.6 14.8  LYMPHSABS 1.0  --   MONOABS 0.4  --   EOSABS 0.1  --   BASOSABS 0.0  --     Chemistries   Recent Labs Lab 08/15/16 0101 08/16/16 0523  NA 139 137  K 3.9 4.8  CL 109 108  CO2 23 26  GLUCOSE 115* 85  BUN 15 10  CREATININE 0.78 0.77  CALCIUM 9.2 8.4*  AST 134* 161*  ALT 71* 256*  ALKPHOS 139* 144*  BILITOT 0.9 0.8   ------------------------------------------------------------------------------------------------------------------ No results for input(s): CHOL, HDL, LDLCALC, TRIG, CHOLHDL, LDLDIRECT in the last 72 hours.  No results found for: HGBA1C ------------------------------------------------------------------------------------------------------------------ No results for input(s): TSH, T4TOTAL, T3FREE, THYROIDAB in the last 72 hours.  Invalid input(s): FREET3 ------------------------------------------------------------------------------------------------------------------ No results for input(s): VITAMINB12, FOLATE, FERRITIN, TIBC, IRON, RETICCTPCT in the last 72 hours.  Coagulation profile No results for input(s): INR, PROTIME in the last 168 hours.  No results for input(s): DDIMER in the last 72 hours.  Cardiac Enzymes  Recent Labs Lab 08/15/16 0101  TROPONINI <0.03    ------------------------------------------------------------------------------------------------------------------ No results found for: BNP   Addeline Calarco M.D on 08/16/2016 at 5:49 PM  Between 7am to 7pm - Pager - [phone]  After 7pm go to [email or web address] - password [email or web address]  [facility] -  Office  [phone]  Dragon dictation system was used to create this note, attempts have been made to correct errors, however presence of uncorrected errors is not a reflection quality of care provided

## 2016-08-16 NOTE — Progress Notes (Signed)
Pancreatitis  Subjective: Pt without further pain  Objective: Vital signs in last 24 hours: Temp:  [98.4 F (36.9 C)-99 F (37.2 C)] 99 F (37.2 C) (04/01 0433) Pulse Rate:  [54-68] 56 (04/01 0433) Resp:  [14-16] 16 (04/01 0433) BP: (98-140)/(48-70) 140/70 (04/01 0433) SpO2:  [92 %-98 %] 92 % (04/01 0433) Last BM Date: 08/14/16  Intake/Output from previous day: 03/31 0701 - 04/01 0700 In: 2381.3 [I.V.:2381.3] Out: 1200 [Urine:1200] Intake/Output this shift: No intake/output data recorded.  General appearance: alert and cooperative GI: min tenderness to palpation  Lab Results:  Results for orders placed or performed during the hospital encounter of 08/15/16 (from the past 24 hour(s))  CBC     Status: None   Collection Time: 08/16/16  5:23 AM  Result Value Ref Range   WBC 6.6 4.0 - 10.5 K/uL   RBC 4.23 3.87 - 5.11 MIL/uL   Hemoglobin 12.3 12.0 - 15.0 g/dL   HCT 38.4 36.0 - 46.0 %   MCV 90.8 78.0 - 100.0 fL   MCH 29.1 26.0 - 34.0 pg   MCHC 32.0 30.0 - 36.0 g/dL   RDW 14.8 11.5 - 15.5 %   Platelets 173 150 - 400 K/uL  Comprehensive metabolic panel     Status: Abnormal   Collection Time: 08/16/16  5:23 AM  Result Value Ref Range   Sodium 137 135 - 145 mmol/L   Potassium 4.8 3.5 - 5.1 mmol/L   Chloride 108 101 - 111 mmol/L   CO2 26 22 - 32 mmol/L   Glucose, Bld 85 65 - 99 mg/dL   BUN 10 6 - 20 mg/dL   Creatinine, Ser 0.77 0.44 - 1.00 mg/dL   Calcium 8.4 (L) 8.9 - 10.3 mg/dL   Total Protein 6.3 (L) 6.5 - 8.1 g/dL   Albumin 3.4 (L) 3.5 - 5.0 g/dL   AST 161 (H) 15 - 41 U/L   ALT 256 (H) 14 - 54 U/L   Alkaline Phosphatase 144 (H) 38 - 126 U/L   Total Bilirubin 0.8 0.3 - 1.2 mg/dL   GFR calc non Af Amer >60 >60 mL/min   GFR calc Af Amer >60 >60 mL/min   Anion gap 3 (L) 5 - 15  Lipase, blood     Status: Abnormal   Collection Time: 08/16/16  5:23 AM  Result Value Ref Range   Lipase 66 (H) 11 - 51 U/L     Studies/Results Radiology     MEDS, Scheduled .  enoxaparin (LOVENOX) injection  40 mg Subcutaneous Q24H  . nicotine  14 mg Transdermal Daily     Assessment: Pancreatitis Cholelithiasis  Plan:  No sign of biliary obstruction currently.  US shows no signs of cholecystitis.  Clear liquids today. OR tom for lap chole if time available   LOS: 1 day    Rosario Adie, MD [facility], [place] [phone]   08/16/2016 8:30 AM

## 2016-08-17 NOTE — Progress Notes (Signed)
[facility] Progress Note     Subjective: Abdominal pain improved - still with intermittent, mild, aching epigastric/RUQ pain. Denies fever/chills/nausea/vomiting. +BM.  Objective: Vital signs in last 24 hours: Temp:  [98.2 F (36.8 C)-98.8 F (37.1 C)] 98.4 F (36.9 C) (04/02 0448) Pulse Rate:  [53-65] 65 (04/02 0448) Resp:  [16] 16 (04/02 0448) BP: (130-148)/(61-65) 148/61 (04/02 0448) SpO2:  [96 %-98 %] 98 % (04/02 0448) Last BM Date: 08/14/16  Intake/Output from previous day: 04/01 0701 - 04/02 0700 In: 3005.4 [P.O.:600; I.V.:2405.4] Out: 5000 [Urine:5000] Intake/Output this shift: No intake/output data recorded.  PE: Gen:  Alert, NAD, pleasant and cooperative Pulm:  Non-labored, clear to auscultation bilaterally Abd: Soft, obese, bowel sounds present all 4 quadrants, mild tenderness to deep palpation epigastrium without guarding or peritonitis.   Lab Results:   Recent Labs  08/15/16 0101 08/16/16 0523  WBC 10.1 6.6  HGB 14.5 12.3  HCT 44.9 38.4  PLT 192 173   BMET  Recent Labs  08/15/16 0101 08/16/16 0523  NA 139 137  K 3.9 4.8  CL 109 108  CO2 23 26  GLUCOSE 115* 85  BUN 15 10  CREATININE 0.78 0.77  CALCIUM 9.2 8.4*   PT/INR No results for input(s): LABPROT, INR in the last 72 hours. CMP     Component Value Date/Time   NA 137 08/16/2016 0523   K 4.8 08/16/2016 0523   CL 108 08/16/2016 0523   CO2 26 08/16/2016 0523   GLUCOSE 85 08/16/2016 0523   BUN 10 08/16/2016 0523   CREATININE 0.77 08/16/2016 0523   CALCIUM 8.4 (L) 08/16/2016 0523   PROT 6.3 (L) 08/16/2016 0523   ALBUMIN 3.4 (L) 08/16/2016 0523   AST 161 (H) 08/16/2016 0523   ALT 256 (H) 08/16/2016 0523   ALKPHOS 144 (H) 08/16/2016 0523   BILITOT 0.8 08/16/2016 0523   GFRNONAA >60 08/16/2016 0523   GFRAA >60 08/16/2016 0523   Lipase     Component Value Date/Time   LIPASE 66 (H) 08/16/2016 0523   Studies/Results: Mr 3d Recon At Scanner  Result Date:  08/15/2016 CLINICAL DATA:  4.6 cm echogenic mass in the liver on a recent limited abdomen ultrasound. EXAM: MRI ABDOMEN WITHOUT AND WITH CONTRAST (INCLUDING MRCP) TECHNIQUE: Multiplanar multisequence MR imaging of the abdomen was performed both before and after the administration of intravenous contrast. Heavily T2-weighted images of the biliary and pancreatic ducts were obtained, and three-dimensional MRCP images were rendered by post processing. CONTRAST:  85m MULTIHANCE GADOBENATE DIMEGLUMINE 529 MG/ML IV SOLN COMPARISON:  Limited abdomen ultrasound dated 08/15/2016. FINDINGS: Lower chest: Minimal right basilar atelectasis. Hepatobiliary: Diffuse low signal intensity of the liver relative to the spleen on fat suppression images. Triangular-shaped mass in the right lobe of the liver measuring 7.6 x 6.8 x 6.5 cm in maximum dimensions. This has homogeneous increased signal intensity on T2 and weighted images and homogeneously decreased signal intensity on precontrast T1 weighted images. This demonstrates peripheral contrast puddling on the postcontrast images. This corresponds to the mass seen at ultrasound. Multiple large stones are filling the gallbladder. The largest stone measures 2.3 cm in maximum diameter. No gallbladder wall thickening or pericholecystic fluid. Pancreas:  Normal.  No mass demonstrated. Spleen:  Normal in size, shape and signal intensity. Adrenals/Urinary Tract: Normal appearing adrenal glands, kidneys and included portions of the ureters. Stomach/Bowel: The stomach and included portions of the small bowel and colon appear normal. Vascular/Lymphatic: Normal appearing aorta and its branches. No enlarged lymph  nodes. Other:  None. Musculoskeletal: Unremarkable. IMPRESSION: 1. 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 2. Diffuse hepatic steatosis. 3. Multiple large gallstones filling the gallbladder without evidence of cholecystitis. The largest stone measures 2.3 cm. Electronically Signed   By:  Claudie Revering M.D.   On: 08/15/2016 08:46   Mr Abdomen Mrcp Moise Boring Contast  Result Date: 08/15/2016 CLINICAL DATA:  4.6 cm echogenic mass in the liver on a recent limited abdomen ultrasound. EXAM: MRI ABDOMEN WITHOUT AND WITH CONTRAST (INCLUDING MRCP) TECHNIQUE: Multiplanar multisequence MR imaging of the abdomen was performed both before and after the administration of intravenous contrast. Heavily T2-weighted images of the biliary and pancreatic ducts were obtained, and three-dimensional MRCP images were rendered by post processing. CONTRAST:  2m MULTIHANCE GADOBENATE DIMEGLUMINE 529 MG/ML IV SOLN COMPARISON:  Limited abdomen ultrasound dated 08/15/2016. FINDINGS: Lower chest: Minimal right basilar atelectasis. Hepatobiliary: Diffuse low signal intensity of the liver relative to the spleen on fat suppression images. Triangular-shaped mass in the right lobe of the liver measuring 7.6 x 6.8 x 6.5 cm in maximum dimensions. This has homogeneous increased signal intensity on T2 and weighted images and homogeneously decreased signal intensity on precontrast T1 weighted images. This demonstrates peripheral contrast puddling on the postcontrast images. This corresponds to the mass seen at ultrasound. Multiple large stones are filling the gallbladder. The largest stone measures 2.3 cm in maximum diameter. No gallbladder wall thickening or pericholecystic fluid. Pancreas:  Normal.  No mass demonstrated. Spleen:  Normal in size, shape and signal intensity. Adrenals/Urinary Tract: Normal appearing adrenal glands, kidneys and included portions of the ureters. Stomach/Bowel: The stomach and included portions of the small bowel and colon appear normal. Vascular/Lymphatic: Normal appearing aorta and its branches. No enlarged lymph nodes. Other:  None. Musculoskeletal: Unremarkable. IMPRESSION: 1. 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 2. Diffuse hepatic steatosis. 3. Multiple large gallstones filling the gallbladder without  evidence of cholecystitis. The largest stone measures 2.3 cm. Electronically Signed   By: SClaudie ReveringM.D.   On: 08/15/2016 08:46   Anti-infectives: Anti-infectives    None     Assessment/Plan Gallstone pancreatitis - abdominal pain significantly improved - lipase 66 from 1,012  - continue clear liquids - NPO after MN for laparoscopic cholecystectomy tomorrow by Dr. MHassell Done  LOS: 2 days    EJill Alexanders, PTr[facility] HospitalSur[facility] 08/17/2016, 7:48 AM Pager: [phone]: [phone]-Fri 7:00 am-4:30 pm Sat-Sun 7:00 am-11:30 am

## 2016-08-17 NOTE — Progress Notes (Signed)
Patient Demographics:    Tammy Davies, is a 56 y.o. female, DOB - [birth year], JIR:[id number]  Admit date - 08/15/2016   Admitting Physician Edwin Dada, MD  Outpatient Primary MD for the patient is No PCP Per Patient  LOS - 2   Chief Complaint  Patient presents with  . Abdominal Pain  . Emesis        Subjective:    Tammy Davies today has no fevers, no emesis,  No chest pain,  Tolerating liquid diet well  Assessment  & Plan :    Principal Problem:   Pancreatitis Active Problems:   Liver mass, right lobe  1. Pancreatitis:- Presumed gallstone, Improved abdominal pain, no further emesis, c/n IV fluids, antiemetics, surgical consult noted, Hydromorphone for pain, ondansetron for nausea, GI consult requested,   abdominal ultrasound without acute cholecystitis Discussed with Dr. Hassell Done, for lap chole on 08/18/16    2. 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 nicotine  14 mg Transdermal Daily   Continuous Infusions: . dextrose 5 % and 0.45% NaCl 75 mL/hr at 08/16/16 1825   PRN Meds:.acetaminophen, HYDROmorphone (DILAUDID) injection, LORazepam, ondansetron **OR** ondansetron (ZOFRAN) IV    Anti-infectives    None        Objective:   Vitals:   08/16/16 1428 08/16/16 2106 08/17/16 0448 08/17/16 1400  BP: 130/65 (!) 146/64 (!) 148/61 133/74  Pulse: 60 (!) 53 65 (!) 52  Resp: 16 16 16 17   Temp: 98.8 F (37.1 C) 98.2 F (36.8 C) 98.4 F (36.9 C) 98 F (36.7 C)  TempSrc: Oral Oral Oral Oral  SpO2: 96% 97% 98% 96%  Weight:      Height:        Wt Readings from Last 3 Encounters:    08/15/16 106.6 kg (235 lb)  03/21/12 108.9 kg (240 lb)     Intake/Output Summary (Last 24 hours) at 08/17/16 1924 Last data filed at 08/17/16 1800  Gross per 24 hour  Intake          2668.75 ml  Output             4700 ml  Net         -2031.25 ml     Physical Exam  Gen:- Awake Alert,  In no apparent distress  HEENT:- [place].AT, No sclera icterus Neck-Supple Neck,No JVD,.  Lungs-  CTAB  CV- S1, S2 normal Abd-  +ve B.Sounds, Abd Soft, epigastric and  upper quadrant tenderness without rebound or guarding  Extremity/Skin:- No  edema,       Data Review:   Micro Results Recent Results (from the past 240 hour(s))  MRSA PCR Screening     Status: None   Collection Time: 08/15/16  6:40 AM  Result Value Ref Range Status   MRSA by PCR NEGATIVE NEGATIVE Final    Comment:  The GeneXpert MRSA Assay (FDA approved for NASAL specimens only), is one component of a comprehensive MRSA colonization surveillance program. It is not intended to diagnose MRSA infection nor to guide or monitor treatment for MRSA infections.     Radiology Reports Mr 3d Recon At Scanner  Result Date: 08/15/2016 CLINICAL DATA:  4.6 cm echogenic mass in the liver on a recent limited abdomen ultrasound. EXAM: MRI ABDOMEN WITHOUT AND WITH CONTRAST (INCLUDING MRCP) TECHNIQUE: Multiplanar multisequence MR imaging of the abdomen was performed both before and after the administration of intravenous contrast. Heavily T2-weighted images of the biliary and pancreatic ducts were obtained, and three-dimensional MRCP images were rendered by post processing. CONTRAST:  58m MULTIHANCE GADOBENATE DIMEGLUMINE 529 MG/ML IV SOLN COMPARISON:  Limited abdomen ultrasound dated 08/15/2016. FINDINGS: Lower chest: Minimal right basilar atelectasis. Hepatobiliary: Diffuse low signal intensity of the liver relative to the spleen on fat suppression images. Triangular-shaped mass in the right lobe of the liver measuring 7.6 x 6.8 x 6.5  cm in maximum dimensions. This has homogeneous increased signal intensity on T2 and weighted images and homogeneously decreased signal intensity on precontrast T1 weighted images. This demonstrates peripheral contrast puddling on the postcontrast images. This corresponds to the mass seen at ultrasound. Multiple large stones are filling the gallbladder. The largest stone measures 2.3 cm in maximum diameter. No gallbladder wall thickening or pericholecystic fluid. Pancreas:  Normal.  No mass demonstrated. Spleen:  Normal in size, shape and signal intensity. Adrenals/Urinary Tract: Normal appearing adrenal glands, kidneys and included portions of the ureters. Stomach/Bowel: The stomach and included portions of the small bowel and colon appear normal. Vascular/Lymphatic: Normal appearing aorta and its branches. No enlarged lymph nodes. Other:  None. Musculoskeletal: Unremarkable. IMPRESSION: 1. 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 2. Diffuse hepatic steatosis. 3. Multiple large gallstones filling the gallbladder without evidence of cholecystitis. The largest stone measures 2.3 cm. Electronically Signed   By: SClaudie ReveringM.D.   On: 08/15/2016 08:46   Mr Abdomen Mrcp WMoise BoringContast  Result Date: 08/15/2016 CLINICAL DATA:  4.6 cm echogenic mass in the liver on a recent limited abdomen ultrasound. EXAM: MRI ABDOMEN WITHOUT AND WITH CONTRAST (INCLUDING MRCP) TECHNIQUE: Multiplanar multisequence MR imaging of the abdomen was performed both before and after the administration of intravenous contrast. Heavily T2-weighted images of the biliary and pancreatic ducts were obtained, and three-dimensional MRCP images were rendered by post processing. CONTRAST:  265mMULTIHANCE GADOBENATE DIMEGLUMINE 529 MG/ML IV SOLN COMPARISON:  Limited abdomen ultrasound dated 08/15/2016. FINDINGS: Lower chest: Minimal right basilar atelectasis. Hepatobiliary: Diffuse low signal intensity of the liver relative to the spleen on fat suppression  images. Triangular-shaped mass in the right lobe of the liver measuring 7.6 x 6.8 x 6.5 cm in maximum dimensions. This has homogeneous increased signal intensity on T2 and weighted images and homogeneously decreased signal intensity on precontrast T1 weighted images. This demonstrates peripheral contrast puddling on the postcontrast images. This corresponds to the mass seen at ultrasound. Multiple large stones are filling the gallbladder. The largest stone measures 2.3 cm in maximum diameter. No gallbladder wall thickening or pericholecystic fluid. Pancreas:  Normal.  No mass demonstrated. Spleen:  Normal in size, shape and signal intensity. Adrenals/Urinary Tract: Normal appearing adrenal glands, kidneys and included portions of the ureters. Stomach/Bowel: The stomach and included portions of the small bowel and colon appear normal. Vascular/Lymphatic: Normal appearing aorta and its branches. No enlarged lymph nodes. Other:  None. Musculoskeletal: Unremarkable. IMPRESSION: 1. 7.6 cm right  lobe liver hemangioma. 2. Diffuse hepatic steatosis. 3. Multiple large gallstones filling the gallbladder without evidence of cholecystitis. The largest stone measures 2.3 cm. Electronically Signed   By: Claudie Revering M.D.   On: 08/15/2016 08:46   US Abdomen Limited Ruq  Result Date: 08/15/2016 CLINICAL DATA:  Acute onset of right upper quadrant abdominal pain and vomiting. Initial encounter. EXAM: US ABDOMEN LIMITED - RIGHT UPPER QUADRANT COMPARISON:  None. FINDINGS: Gallbladder: The gallbladder is filled with stones, with a wall echo shadow sign. No definite gallbladder wall thickening or pericholecystic fluid is seen. No ultrasonographic Murphy's sign is elicited. Common bile duct: Diameter: 0.3 cm, within normal limits in caliber. Liver: A large echogenic lesion is noted at the right hepatic lobe, measuring 4.6 x 4.2 x 3.8 cm. Though this could reflect an hemangioma or adenoma, malignancy cannot be entirely excluded.  Otherwise within normal limits in parenchymal echogenicity. IMPRESSION: 1. Gallbladder filled with stones, with a wall echo shadow sign. No evidence for obstruction or cholecystitis. 2. Large 4.6 cm echogenic lesion at the right hepatic lobe. Though this could reflect an hemangioma or adenoma, malignancy cannot be entirely excluded. Dynamic liver protocol MRI or CT would be helpful for further evaluation. Electronically Signed   By: Garald Balding M.D.   On: 08/15/2016 02:37     CBC  Recent Labs Lab 08/15/16 0101 08/16/16 0523  WBC 10.1 6.6  HGB 14.5 12.3  HCT 44.9 38.4  PLT 192 173  MCV 88.9 90.8  MCH 28.7 29.1  MCHC 32.3 32.0  RDW 14.6 14.8  LYMPHSABS 1.0  --   MONOABS 0.4  --   EOSABS 0.1  --   BASOSABS 0.0  --     Chemistries   Recent Labs Lab 08/15/16 0101 08/16/16 0523  NA 139 137  K 3.9 4.8  CL 109 108  CO2 23 26  GLUCOSE 115* 85  BUN 15 10  CREATININE 0.78 0.77  CALCIUM 9.2 8.4*  AST 134* 161*  ALT 71* 256*  ALKPHOS 139* 144*  BILITOT 0.9 0.8   ------------------------------------------------------------------------------------------------------------------ No results for input(s): CHOL, HDL, LDLCALC, TRIG, CHOLHDL, LDLDIRECT in the last 72 hours.  No results found for: HGBA1C ------------------------------------------------------------------------------------------------------------------ No results for input(s): TSH, T4TOTAL, T3FREE, THYROIDAB in the last 72 hours.  Invalid input(s): FREET3 ------------------------------------------------------------------------------------------------------------------ No results for input(s): VITAMINB12, FOLATE, FERRITIN, TIBC, IRON, RETICCTPCT in the last 72 hours.  Coagulation profile No results for input(s): INR, PROTIME in the last 168 hours.  No results for input(s): DDIMER in the last 72 hours.  Cardiac Enzymes  Recent Labs Lab 08/15/16 0101  TROPONINI <0.03    ------------------------------------------------------------------------------------------------------------------ No results found for: BNP   Takila Kronberg M.D on 08/17/2016 at 7:24 PM  Between 7am to 7pm - Pager - [phone]  After 7pm go to [email or web address] - password [email or web address]  [facility] -  Office  [phone]  Dragon dictation system was used to create this note, attempts have been made to correct errors, however presence of uncorrected errors is not a reflection quality of care provided

## 2016-08-18 ENCOUNTER — Inpatient Hospital Stay (HOSPITAL_COMMUNITY): Payer: Self-pay | Admitting: Anesthesiology

## 2016-08-18 ENCOUNTER — Encounter (HOSPITAL_COMMUNITY)
Admission: EM | Disposition: A | Discharge status: Home / Self Care | Payer: Self-pay | Source: Home / Self Care | Attending: Family Medicine

## 2016-08-18 ENCOUNTER — Inpatient Hospital Stay (HOSPITAL_COMMUNITY): Payer: Self-pay

## 2016-08-18 ENCOUNTER — Encounter (HOSPITAL_COMMUNITY): Payer: Self-pay

## 2016-08-18 DIAGNOSIS — Z720 Tobacco use: Secondary | ICD-10-CM

## 2016-08-18 DIAGNOSIS — E6609 Other obesity due to excess calories: Secondary | ICD-10-CM

## 2016-08-18 DIAGNOSIS — I480 Paroxysmal atrial fibrillation: Secondary | ICD-10-CM

## 2016-08-18 HISTORY — PX: CHOLECYSTECTOMY: SHX55

## 2016-08-18 LAB — CREATININE, SERUM: Creatinine, Ser: 0.68 mg/dL (ref 0.44–1.00)

## 2016-08-18 LAB — CBC
HEMATOCRIT: 41.6 % (ref 36.0–46.0)
HEMOGLOBIN: 13.6 g/dL (ref 12.0–15.0)
MCH: 27.8 pg (ref 26.0–34.0)
MCHC: 32.7 g/dL (ref 30.0–36.0)
MCV: 84.9 fL (ref 78.0–100.0)
Platelets: 200 10*3/uL (ref 150–400)
RBC: 4.9 MIL/uL (ref 3.87–5.11)
RDW: 13.9 % (ref 11.5–15.5)
WBC: 10.6 10*3/uL — ABNORMAL HIGH (ref 4.0–10.5)

## 2016-08-18 LAB — MRSA PCR SCREENING: MRSA by PCR: NEGATIVE

## 2016-08-18 SURGERY — LAPAROSCOPIC CHOLECYSTECTOMY WITH INTRAOPERATIVE CHOLANGIOGRAM
Anesthesia: General

## 2016-08-18 MED ORDER — ROCURONIUM BROMIDE 10 MG/ML (PF) SYRINGE
PREFILLED_SYRINGE | INTRAVENOUS | Status: DC | PRN
  Administered 2016-08-18: 50 mg via INTRAVENOUS

## 2016-08-18 MED ORDER — PROMETHAZINE HCL 25 MG/ML IJ SOLN: 6.2500 mg | INTRAMUSCULAR | Status: DC | PRN

## 2016-08-18 MED ORDER — MORPHINE SULFATE (PF) 4 MG/ML IV SOLN
1.0000 mg | INTRAVENOUS | Status: DC | PRN
  Administered 2016-08-18 – 2016-08-19 (×3): 1 mg via INTRAVENOUS
  Filled 2016-08-18 (×4): qty 1

## 2016-08-18 MED ORDER — LIDOCAINE 2% (20 MG/ML) 5 ML SYRINGE
INTRAMUSCULAR | Status: AC
  Filled 2016-08-18: qty 5

## 2016-08-18 MED ORDER — FENTANYL CITRATE (PF) 100 MCG/2ML IJ SOLN
INTRAMUSCULAR | Status: AC
  Filled 2016-08-18: qty 2

## 2016-08-18 MED ORDER — FENTANYL CITRATE (PF) 100 MCG/2ML IJ SOLN
INTRAMUSCULAR | Status: DC | PRN
  Administered 2016-08-18 (×3): 50 ug via INTRAVENOUS
  Administered 2016-08-18: 100 ug via INTRAVENOUS
  Administered 2016-08-18 (×2): 50 ug via INTRAVENOUS

## 2016-08-18 MED ORDER — PROPOFOL 10 MG/ML IV BOLUS
INTRAVENOUS | Status: DC | PRN
  Administered 2016-08-18: 200 mg via INTRAVENOUS

## 2016-08-18 MED ORDER — SUCCINYLCHOLINE CHLORIDE 200 MG/10ML IV SOSY
PREFILLED_SYRINGE | INTRAVENOUS | Status: DC | PRN
  Administered 2016-08-18: 120 mg via INTRAVENOUS

## 2016-08-18 MED ORDER — HYDROMORPHONE HCL 1 MG/ML IJ SOLN
0.2500 mg | INTRAMUSCULAR | Status: DC | PRN
  Administered 2016-08-18: 0.5 mg via INTRAVENOUS

## 2016-08-18 MED ORDER — MIDAZOLAM HCL 2 MG/2ML IJ SOLN
INTRAMUSCULAR | Status: AC
  Filled 2016-08-18: qty 2

## 2016-08-18 MED ORDER — BUPIVACAINE LIPOSOME 1.3 % IJ SUSP
20.0000 mL | Freq: Once | INTRAMUSCULAR | Status: AC
  Administered 2016-08-18: 20 mL
  Filled 2016-08-18: qty 20

## 2016-08-18 MED ORDER — SUGAMMADEX SODIUM 200 MG/2ML IV SOLN
INTRAVENOUS | Status: DC | PRN
  Administered 2016-08-18: 200 mg via INTRAVENOUS

## 2016-08-18 MED ORDER — LACTATED RINGERS IV SOLN
INTRAVENOUS | Status: DC
  Administered 2016-08-18: 1000 mL via INTRAVENOUS
  Administered 2016-08-18 (×2): via INTRAVENOUS

## 2016-08-18 MED ORDER — CEFAZOLIN SODIUM-DEXTROSE 2-4 GM/100ML-% IV SOLN
2.0000 g | Freq: Three times a day (TID) | INTRAVENOUS | Status: AC
  Administered 2016-08-18: 2 g via INTRAVENOUS
  Filled 2016-08-18: qty 100

## 2016-08-18 MED ORDER — DILTIAZEM LOAD VIA INFUSION
10.0000 mg | Freq: Once | INTRAVENOUS | Status: DC
  Filled 2016-08-18: qty 10

## 2016-08-18 MED ORDER — ONDANSETRON HCL 4 MG/2ML IJ SOLN
INTRAMUSCULAR | Status: DC | PRN
  Administered 2016-08-18: 4 mg via INTRAVENOUS

## 2016-08-18 MED ORDER — LIDOCAINE 2% (20 MG/ML) 5 ML SYRINGE
INTRAMUSCULAR | Status: DC | PRN
  Administered 2016-08-18: 100 mg via INTRAVENOUS

## 2016-08-18 MED ORDER — DILTIAZEM HCL 25 MG/5ML IV SOLN
INTRAVENOUS | Status: AC
  Administered 2016-08-18: 10 mg
  Filled 2016-08-18: qty 5

## 2016-08-18 MED ORDER — ROCURONIUM BROMIDE 50 MG/5ML IV SOSY
PREFILLED_SYRINGE | INTRAVENOUS | Status: AC
  Filled 2016-08-18: qty 5

## 2016-08-18 MED ORDER — PANTOPRAZOLE SODIUM 40 MG IV SOLR
40.0000 mg | Freq: Every day | INTRAVENOUS | Status: DC
  Administered 2016-08-18 – 2016-08-19 (×2): 40 mg via INTRAVENOUS
  Filled 2016-08-18 (×2): qty 40

## 2016-08-18 MED ORDER — HYDROMORPHONE HCL 1 MG/ML IJ SOLN
INTRAMUSCULAR | Status: AC
  Filled 2016-08-18: qty 1

## 2016-08-18 MED ORDER — ONDANSETRON HCL 4 MG/2ML IJ SOLN
INTRAMUSCULAR | Status: AC
  Filled 2016-08-18: qty 2

## 2016-08-18 MED ORDER — SUGAMMADEX SODIUM 200 MG/2ML IV SOLN
INTRAVENOUS | Status: AC
  Filled 2016-08-18: qty 2

## 2016-08-18 MED ORDER — DEXAMETHASONE SODIUM PHOSPHATE 10 MG/ML IJ SOLN
INTRAMUSCULAR | Status: DC | PRN
  Administered 2016-08-18: 10 mg via INTRAVENOUS

## 2016-08-18 MED ORDER — IOPAMIDOL (ISOVUE-300) INJECTION 61%
INTRAVENOUS | Status: DC | PRN
  Administered 2016-08-18: 15 mL

## 2016-08-18 MED ORDER — SUCCINYLCHOLINE CHLORIDE 200 MG/10ML IV SOSY
PREFILLED_SYRINGE | INTRAVENOUS | Status: AC
  Filled 2016-08-18: qty 10

## 2016-08-18 MED ORDER — HEPARIN SODIUM (PORCINE) 5000 UNIT/ML IJ SOLN
5000.0000 [IU] | Freq: Three times a day (TID) | INTRAMUSCULAR | Status: DC
  Administered 2016-08-19 – 2016-08-20 (×4): 5000 [IU] via SUBCUTANEOUS
  Filled 2016-08-18 (×4): qty 1

## 2016-08-18 MED ORDER — CEFAZOLIN SODIUM-DEXTROSE 2-4 GM/100ML-% IV SOLN
INTRAVENOUS | Status: AC
  Filled 2016-08-18: qty 100

## 2016-08-18 MED ORDER — CEFAZOLIN SODIUM-DEXTROSE 2-3 GM-% IV SOLR
INTRAVENOUS | Status: DC | PRN
  Administered 2016-08-18: 2 g via INTRAVENOUS

## 2016-08-18 MED ORDER — DEXAMETHASONE SODIUM PHOSPHATE 10 MG/ML IJ SOLN
INTRAMUSCULAR | Status: AC
  Filled 2016-08-18: qty 1

## 2016-08-18 MED ORDER — HYDROCODONE-ACETAMINOPHEN 5-325 MG PO TABS
1.0000 | ORAL_TABLET | ORAL | Status: DC | PRN
  Administered 2016-08-18: 1 via ORAL
  Filled 2016-08-18: qty 1

## 2016-08-18 MED ORDER — ONDANSETRON 4 MG PO TBDP: 4.0000 mg | ORAL_TABLET | Freq: Four times a day (QID) | ORAL | Status: DC | PRN

## 2016-08-18 MED ORDER — ONDANSETRON HCL 4 MG/2ML IJ SOLN
4.0000 mg | Freq: Four times a day (QID) | INTRAMUSCULAR | Status: DC | PRN
  Administered 2016-08-18: 4 mg via INTRAVENOUS
  Filled 2016-08-18: qty 2

## 2016-08-18 MED ORDER — MIDAZOLAM HCL 5 MG/5ML IJ SOLN
INTRAMUSCULAR | Status: DC | PRN
  Administered 2016-08-18: 2 mg via INTRAVENOUS

## 2016-08-18 MED ORDER — IOPAMIDOL (ISOVUE-300) INJECTION 61%
INTRAVENOUS | Status: AC
  Filled 2016-08-18: qty 50

## 2016-08-18 MED ORDER — LACTATED RINGERS IR SOLN
Status: DC | PRN
  Administered 2016-08-18: 3000 mL

## 2016-08-18 MED ORDER — ALBUMIN HUMAN 5 % IV SOLN
INTRAVENOUS | Status: DC | PRN
  Administered 2016-08-18: 16:00:00 via INTRAVENOUS

## 2016-08-18 MED ORDER — DILTIAZEM HCL 100 MG IV SOLR
5.0000 mg/h | INTRAVENOUS | Status: DC
  Administered 2016-08-18: 5 mg/h via INTRAVENOUS
  Filled 2016-08-18: qty 100

## 2016-08-18 MED ORDER — PROPOFOL 10 MG/ML IV BOLUS
INTRAVENOUS | Status: AC
  Filled 2016-08-18: qty 20

## 2016-08-18 MED ORDER — KCL IN DEXTROSE-NACL 20-5-0.45 MEQ/L-%-% IV SOLN
INTRAVENOUS | Status: DC
  Administered 2016-08-18 – 2016-08-19 (×3): via INTRAVENOUS
  Filled 2016-08-18 (×4): qty 1000

## 2016-08-18 SURGICAL SUPPLY — 39 items
APPLICATOR COTTON TIP 6IN STRL (MISCELLANEOUS) IMPLANT
APPLIER CLIP ROT 10 11.4 M/L (STAPLE) ×3
BENZOIN TINCTURE PRP APPL 2/3 (GAUZE/BANDAGES/DRESSINGS) IMPLANT
CABLE HIGH FREQUENCY MONO STRZ (ELECTRODE) ×3 IMPLANT
CATH REDDICK CHOLANGI 4FR 50CM (CATHETERS) ×3 IMPLANT
CLIP APPLIE ROT 10 11.4 M/L (STAPLE) ×1 IMPLANT
CLOSURE WOUND 1/2 X4 (GAUZE/BANDAGES/DRESSINGS)
COVER MAYO STAND STRL (DRAPES) ×3 IMPLANT
COVER SURGICAL LIGHT HANDLE (MISCELLANEOUS) ×3 IMPLANT
DECANTER SPIKE VIAL GLASS SM (MISCELLANEOUS) IMPLANT
DERMABOND ADVANCED (GAUZE/BANDAGES/DRESSINGS) ×2
DERMABOND ADVANCED .7 DNX12 (GAUZE/BANDAGES/DRESSINGS) ×1 IMPLANT
DRAPE C-ARM 42X120 X-RAY (DRAPES) ×3 IMPLANT
ELECT PENCIL ROCKER SW 15FT (MISCELLANEOUS) IMPLANT
ELECT REM PT RETURN 15FT ADLT (MISCELLANEOUS) ×3 IMPLANT
GLOVE BIOGEL M 8.0 STRL (GLOVE) ×3 IMPLANT
GOWN STRL REUS W/ TWL LRG LVL3 (GOWN DISPOSABLE) ×1 IMPLANT
GOWN STRL REUS W/TWL LRG LVL3 (GOWN DISPOSABLE) ×5 IMPLANT
GOWN STRL REUS W/TWL XL LVL3 (GOWN DISPOSABLE) ×3 IMPLANT
HEMOSTAT SURGICEL 4X8 (HEMOSTASIS) IMPLANT
IRRIG SUCT STRYKERFLOW 2 WTIP (MISCELLANEOUS) ×3
IRRIGATION SUCT STRKRFLW 2 WTP (MISCELLANEOUS) ×1 IMPLANT
IV CATH 14GX2 1/4 (CATHETERS) ×3 IMPLANT
KIT BASIN OR (CUSTOM PROCEDURE TRAY) ×3 IMPLANT
L-HOOK LAP DISP 36CM (ELECTROSURGICAL)
LHOOK LAP DISP 36CM (ELECTROSURGICAL) IMPLANT
POUCH RETRIEVAL ECOSAC 10 (ENDOMECHANICALS) ×1 IMPLANT
POUCH RETRIEVAL ECOSAC 10MM (ENDOMECHANICALS) ×2
SCISSORS LAP 5X45 EPIX DISP (ENDOMECHANICALS) ×3 IMPLANT
SLEEVE XCEL OPT CAN 5 100 (ENDOMECHANICALS) ×9 IMPLANT
STRIP CLOSURE SKIN 1/2X4 (GAUZE/BANDAGES/DRESSINGS) IMPLANT
SUT VIC AB 4-0 SH 18 (SUTURE) ×3 IMPLANT
SYR 20CC LL (SYRINGE) ×3 IMPLANT
TOWEL OR 17X26 10 PK STRL BLUE (TOWEL DISPOSABLE) ×3 IMPLANT
TRAY LAPAROSCOPIC (CUSTOM PROCEDURE TRAY) ×3 IMPLANT
TROCAR BLADELESS OPT 5 100 (ENDOMECHANICALS) ×3 IMPLANT
TROCAR XCEL BLUNT TIP 100MML (ENDOMECHANICALS) IMPLANT
TROCAR XCEL NON-BLD 11X100MML (ENDOMECHANICALS) ×3 IMPLANT
TUBING INSUF HEATED (TUBING) ×3 IMPLANT

## 2016-08-18 NOTE — Anesthesia Preprocedure Evaluation (Signed)
Anesthesia Evaluation  Patient identified by MRN, date of birth, ID band Patient awake    Reviewed: Allergy & Precautions, NPO status , Patient's Chart, lab work & pertinent test results  Airway Mallampati: II  TM Distance: >3 FB Neck ROM: Full    Dental  (+) Poor Dentition, Dental Advisory Given   Pulmonary Current Smoker,    Pulmonary exam normal        Cardiovascular negative cardio ROS Normal cardiovascular exam     Neuro/Psych negative neurological ROS  negative psych ROS   GI/Hepatic Neg liver ROS,   Endo/Other  Morbid obesity  Renal/GU negative Renal ROS  negative genitourinary   Musculoskeletal negative musculoskeletal ROS (+)   Abdominal   Peds negative pediatric ROS (+)  Hematology negative hematology ROS (+)   Anesthesia Other Findings   Reproductive/Obstetrics negative OB ROS                             Anesthesia Physical Anesthesia Plan  ASA: II  Anesthesia Plan: General   Post-op Pain Management:    Induction: Intravenous  Airway Management Planned: Oral ETT  Additional Equipment:   Intra-op Plan:   Post-operative Plan: Extubation in OR  Informed Consent: I have reviewed the patients History and Physical, chart, labs and discussed the procedure including the risks, benefits and alternatives for the proposed anesthesia with the patient or authorized representative who has indicated his/her understanding and acceptance.   Dental advisory given  Plan Discussed with: CRNA, Anesthesiologist and Surgeon  Anesthesia Plan Comments:         Anesthesia Quick Evaluation

## 2016-08-18 NOTE — H&P (View-Only) (Signed)
Pancreatitis  Subjective: Pt without further pain  Objective: Vital signs in last 24 hours: Temp:  [98.4 F (36.9 C)-99 F (37.2 C)] 99 F (37.2 C) (04/01 0433) Pulse Rate:  [54-68] 56 (04/01 0433) Resp:  [14-16] 16 (04/01 0433) BP: (98-140)/(48-70) 140/70 (04/01 0433) SpO2:  [92 %-98 %] 92 % (04/01 0433) Last BM Date: 08/14/16  Intake/Output from previous day: 03/31 0701 - 04/01 0700 In: 2381.3 [I.V.:2381.3] Out: 1200 [Urine:1200] Intake/Output this shift: No intake/output data recorded.  General appearance: alert and cooperative GI: min tenderness to palpation  Lab Results:  Results for orders placed or performed during the hospital encounter of 08/15/16 (from the past 24 hour(s))  CBC     Status: None   Collection Time: 08/16/16  5:23 AM  Result Value Ref Range   WBC 6.6 4.0 - 10.5 K/uL   RBC 4.23 3.87 - 5.11 MIL/uL   Hemoglobin 12.3 12.0 - 15.0 g/dL   HCT 38.4 36.0 - 46.0 %   MCV 90.8 78.0 - 100.0 fL   MCH 29.1 26.0 - 34.0 pg   MCHC 32.0 30.0 - 36.0 g/dL   RDW 14.8 11.5 - 15.5 %   Platelets 173 150 - 400 K/uL  Comprehensive metabolic panel     Status: Abnormal   Collection Time: 08/16/16  5:23 AM  Result Value Ref Range   Sodium 137 135 - 145 mmol/L   Potassium 4.8 3.5 - 5.1 mmol/L   Chloride 108 101 - 111 mmol/L   CO2 26 22 - 32 mmol/L   Glucose, Bld 85 65 - 99 mg/dL   BUN 10 6 - 20 mg/dL   Creatinine, Ser 0.77 0.44 - 1.00 mg/dL   Calcium 8.4 (L) 8.9 - 10.3 mg/dL   Total Protein 6.3 (L) 6.5 - 8.1 g/dL   Albumin 3.4 (L) 3.5 - 5.0 g/dL   AST 161 (H) 15 - 41 U/L   ALT 256 (H) 14 - 54 U/L   Alkaline Phosphatase 144 (H) 38 - 126 U/L   Total Bilirubin 0.8 0.3 - 1.2 mg/dL   GFR calc non Af Amer >60 >60 mL/min   GFR calc Af Amer >60 >60 mL/min   Anion gap 3 (L) 5 - 15  Lipase, blood     Status: Abnormal   Collection Time: 08/16/16  5:23 AM  Result Value Ref Range   Lipase 66 (H) 11 - 51 U/L     Studies/Results Radiology     MEDS, Scheduled .  enoxaparin (LOVENOX) injection  40 mg Subcutaneous Q24H  . nicotine  14 mg Transdermal Daily     Assessment: Pancreatitis Cholelithiasis  Plan:  No sign of biliary obstruction currently.  US shows no signs of cholecystitis.  Clear liquids today. OR tom for lap chole if time available   LOS: 1 day    Rosario Adie, MD [facility], [place] [phone]   08/16/2016 8:30 AM

## 2016-08-18 NOTE — Anesthesia Postprocedure Evaluation (Addendum)
Anesthesia Post Note  Patient: Tammy Davies  Procedure(s) Performed: Procedure(s) (LRB): LAPAROSCOPIC CHOLECYSTECTOMY WITH  INTRAOPERATIVE CHOLANGIOGRAM (N/A)  Patient location during evaluation: PACU Anesthesia Type: General Level of consciousness: sedated Pain management: pain level controlled Vital Signs Assessment: post-procedure vital signs reviewed and stable Respiratory status: spontaneous breathing and respiratory function stable Cardiovascular status: tachycardic and stable Anesthetic complications: yes Anesthetic complication details: anesthesia complicationsComments: Pt developed atrial fibrillation intraoperative.  Cardizem administered, cardiology consulted.  Cardiologist recommendations followed.         Last Vitals:  Vitals:   08/18/16 1715 08/18/16 1730  BP: 122/83 (!) 134/119  Pulse: (!) 119 (!) 108  Resp: 17 18  Temp:  36.7 C    Last Pain:  Vitals:   08/18/16 1730  TempSrc:   PainSc: 3                  Mozell Hardacre DANIEL

## 2016-08-18 NOTE — Anesthesia Procedure Notes (Signed)
Procedure Name: Intubation Date/Time: 08/18/2016 2:14 PM Performed by: Lind Covert Pre-anesthesia Checklist: Patient identified, Emergency Drugs available, Suction available, Patient being monitored and Timeout performed Patient Re-evaluated:Patient Re-evaluated prior to inductionOxygen Delivery Method: Circle system utilized Preoxygenation: Pre-oxygenation with 100% oxygen Intubation Type: IV induction Laryngoscope Size: Mac and 3 Grade View: Grade I Tube type: Oral Tube size: 7.0 mm Number of attempts: 1 Airway Equipment and Method: Stylet Placement Confirmation: ETT inserted through vocal cords under direct vision,  breath sounds checked- equal and bilateral and positive ETCO2 Secured at: 22 cm Tube secured with: Tape Dental Injury: Teeth and Oropharynx as per pre-operative assessment

## 2016-08-18 NOTE — Addendum Note (Signed)
Addendum  created 08/18/16 1755 by Duane Boston, MD   Sign clinical note

## 2016-08-18 NOTE — Progress Notes (Signed)
PROGRESS NOTE  BEA DUREN NTI:[id number] DOB: 28-Apr-1961 DOA: 08/15/2016 PCP: No PCP Per Patient   LOS: 3 days   Brief Narrative: 56 y.o. female with no significant past medical history who presents with acute epigastric pain, found to have acute pancreatitis  Assessment & Plan: Principal Problem:   Pancreatitis Active Problems:   Liver mass, right lobe   Acute gallstone pancreatitis -Improving, no further abdominal pain/emesis, -General surgery was consulted and plan to do laparoscopic cholecystectomy on 4/3 -MRI/MRCP shows multiple large gallstones within the gallbladder  ? 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   Objective: Vitals:   08/17/16 0448 08/17/16 1400 08/17/16 2045 08/18/16 0548  BP: (!) 148/61 133/74 131/64 (!) 146/90  Pulse: 65 (!) 52 (!) 56 (!) 52  Resp: 16 17 17 20   Temp: 98.4 F (36.9 C) 98 F (36.7 C) 98.8 F (37.1 C) 98 F (36.7 C)  TempSrc: Oral Oral Oral Oral  SpO2: 98% 96% 97% 100%  Weight:      Height:        Intake/Output Summary (Last 24 hours) at 08/18/16 1308 Last data filed at 08/18/16 1013  Gross per 24 hour  Intake             2280 ml  Output             3650 ml  Net            -1370 ml   Filed Weights   08/15/16 0612  Weight: 106.6 kg (235 lb)    Examination: Constitutional: NAD Vitals:   08/17/16 0448 08/17/16 1400 08/17/16 2045 08/18/16 0548  BP: (!) 148/61 133/74 131/64 (!) 146/90  Pulse: 65 (!) 52 (!) 56 (!)  52  Resp: 16 17 17 20   Temp: 98.4 F (36.9 C) 98 F (36.7 C) 98.8 F (37.1 C) 98 F (36.7 C)  TempSrc: Oral Oral Oral Oral  SpO2: 98% 96% 97% 100%  Weight:      Height:       Eyes: PERRL, lids and conjunctivae normal ENMT: Mucous membranes are moist. No oropharyngeal exudates Respiratory: clear to auscultation bilaterally, no wheezing, no crackles.  Cardiovascular: Regular rate and rhythm, no murmurs / rubs / gallops.  Abdomen: no tenderness. Bowel sounds positive.  Neurologic: non focal    Data Reviewed: I have personally reviewed following labs and imaging studies  CBC:  Recent Labs Lab 08/15/16 0101 08/16/16 0523  WBC 10.1 6.6  NEUTROABS 8.7*  --   HGB 14.5 12.3  HCT 44.9 38.4  MCV 88.9 90.8  PLT 192 867   Basic Metabolic Panel:  Recent Labs Lab 08/15/16 0101 08/16/16 0523  NA 139 137  K 3.9 4.8  CL 109 108  CO2 23 26  GLUCOSE 115* 85  BUN 15 10  CREATININE 0.78 0.77  CALCIUM 9.2 8.4*   GFR: Estimated Creatinine Clearance: 101.6 mL/min (by C-G formula based on SCr of 0.77 mg/dL). Liver Function Tests:  Recent Labs Lab 08/15/16 0101 08/16/16 0523  AST 134* 161*  ALT 71* 256*  ALKPHOS 139* 144*  BILITOT 0.9 0.8  PROT 7.0 6.3*  ALBUMIN 3.9 3.4*    Recent Labs Lab 08/15/16 0307 08/16/16 0523  LIPASE 1,012* 66*   No results for input(s): AMMONIA in the last 168 hours. Coagulation Profile: No results for input(s): INR, PROTIME in the last 168 hours. Cardiac Enzymes:  Recent Labs Lab 08/15/16 0101  TROPONINI <0.03   BNP (last 3 results) No results for input(s): PROBNP in the last 8760 hours. HbA1C: No results for input(s): HGBA1C in the last 72 hours. CBG: No results for input(s): GLUCAP in the last 168 hours. Lipid Profile: No results for input(s): CHOL, HDL, LDLCALC, TRIG, CHOLHDL, LDLDIRECT in the last 72 hours. Thyroid Function Tests: No results for input(s): TSH, T4TOTAL, FREET4, T3FREE, THYROIDAB in the last 72  hours. Anemia Panel: No results for input(s): VITAMINB12, FOLATE, FERRITIN, TIBC, IRON, RETICCTPCT in the last 72 hours. Urine analysis:    Component Value Date/Time   COLORURINE YELLOW 08/15/2016 0040   APPEARANCEUR CLEAR 08/15/2016 0040   LABSPEC 1.021 08/15/2016 0040   PHURINE 5.0 08/15/2016 0040   GLUCOSEU NEGATIVE 08/15/2016 0040   HGBUR SMALL (A) 08/15/2016 0040   BILIRUBINUR NEGATIVE 08/15/2016 0040   KETONESUR NEGATIVE 08/15/2016 0040   PROTEINUR NEGATIVE 08/15/2016 0040   NITRITE NEGATIVE 08/15/2016 0040   LEUKOCYTESUR NEGATIVE 08/15/2016 0040   Sepsis Labs: Invalid input(s): PROCALCITONIN, LACTICIDVEN  Recent Results (from the past 240 hour(s))  MRSA PCR Screening     Status: None   Collection Time: 08/15/16  6:40 AM  Result Value Ref Range Status   MRSA by PCR NEGATIVE NEGATIVE Final    Comment:        The GeneXpert MRSA Assay (FDA approved for NASAL specimens only), is one component of a comprehensive MRSA colonization surveillance program. It is not intended to diagnose MRSA infection nor to guide or monitor treatment for MRSA infections.     Radiology Studies: No results found.   Scheduled Meds: . bupivacaine liposome  20 mL Infiltration Once  . [MAR Hold] nicotine  14 mg Transdermal Daily   Continuous Infusions: . dextrose 5 % and 0.45% NaCl Stopped (08/18/16 1130)  . lactated ringers 1,000 mL (08/18/16 1225)    Marzetta Board, MD, PhD [facility] Pager [phone]  If 7PM-7AM, please contact night-coverage [email or web address] Password [email or web address] 08/18/2016, 1:08 PM

## 2016-08-18 NOTE — Consult Note (Signed)
Cardiology Consultation:   Patient ID: Tammy Davies; [id number]; Aug 26, 1960   Admit date: 08/15/2016 Date of Consult: 08/18/2016  Reason for Consultation: Evaluation of atrial fibrillation in the setting of cholecystectomy Referring Provider:  Dr. Hassell Done  Primary Care Provider: No PCP Per Patient Primary Cardiologist: New   Patient Profile:   Tammy Davies is a 56 y.o. female admitted with gallstone pancreatitis who developed perioperative atrial fibrillation post cholecystectomy.  History of Present Illness:   Tammy Davies is a 56 year old female with no significant prior past medical history, no prior heart history who was admitted with gallstone pancreatitis, lipase of 1000 who underwent cholecystectomy on 08/17/16 by Dr. Hassell Done. Toward the end of the procedure, her heart rate was noted to be irregular and atrial fibrillation was diagnosed. She was given IV Cardizem with good overall rate control and is currently between 101 110 bpm. She is on IV drip at 5 increased to 10 per hour.  At home, she states that she occasionally will feel palpitations but they are usually short-lived and not prolonged. She does not describe any exertional anginal symptoms at home. She does admit to smoking and once to quit. She says the patches are helping.  Currently she feels a mild heaviness across her abdomen and lower chest wall with no significant shortness of breath. She is able to complete full sentences. No respiratory distress.  History reviewed. No pertinent past medical history.  Past Surgical History:  Procedure Laterality Date  . ABDOMINAL HYSTERECTOMY       Inpatient Medications: Scheduled Meds: . ceFAZolin      . diltiazem  10 mg Intravenous Once  . [MAR Hold] nicotine  14 mg Transdermal Daily   Continuous Infusions: . dextrose 5 % and 0.45% NaCl Stopped (08/18/16 1130)  . diltiazem (CARDIZEM) infusion 10 mg/hr (08/18/16 1700)  . lactated ringers 1,000 mL (08/18/16  1225)   PRN Meds: [MAR Hold] acetaminophen, HYDROmorphone (DILAUDID) injection, [MAR Hold]  HYDROmorphone (DILAUDID) injection, [MAR Hold] LORazepam, [MAR Hold] ondansetron **OR** [MAR Hold] ondansetron (ZOFRAN) IV, promethazine  Allergies:   No Known Allergies  Social History:   Social History   Social History  . Marital status: Single    Spouse name: N/A  . Number of children: N/A  . Years of education: N/A   Social History Main Topics  . Smoking status: Current Every Day Smoker    Types: Cigarettes  . Smokeless tobacco: Never Used  . Alcohol use No  . Drug use: Yes    Types: Marijuana  . Sexual activity: Not Asked   Other Topics Concern  . None   Social History Narrative  . None    Family History:   Family History  Problem Relation Age of Onset  . Heart failure Mother   . Breast cancer Mother   . Heart failure Father   . Diabetes Father      ROS:  Please see the history of present illness. Denies any fevers, chills, orthopnea, PND, syncope, bleeding. All other ROS reviewed and negative.     Physical Exam/Data:   Vitals:   08/18/16 1615 08/18/16 1630 08/18/16 1645 08/18/16 1700  BP: (!) 143/83 126/87 (!) 124/93 (!) 117/92  Pulse: (!) 116 94 (!) 112 (!) 105  Resp: 15 13 18 16   Temp: 97.6 F (36.4 C)     TempSrc:      SpO2: 97% 99% 97% 96%  Weight:      Height:  Intake/Output Summary (Last 24 hours) at 08/18/16 1712 Last data filed at 08/18/16 1650  Gross per 24 hour  Intake             3870 ml  Output             3575 ml  Net              295 ml   Filed Weights   08/15/16 0612  Weight: 235 lb (106.6 kg)    General:  Well nourished, well developed, in no acute distress, resting comfortably in PACU postop cholecystectomy HEENT: normal Lymph: no adenopathy Neck: no JVD Endocrine:  No thryomegaly Vascular: No carotid bruits; FA pulses 2+ bilaterally without bruits  Cardiac:  normal S1, S2; irregularly irregular, mildly tachycardic; no  murmur  Lungs:  clear to auscultation bilaterally, no wheezing, rhonchi or rales  Abd: soft, nontender, no hepatomegaly , obese Ext: no edema Musculoskeletal:  No deformities, BUE and BLE strength normal and equal Skin: warm and dry  Neuro:  CNs 2-12 intact, no focal abnormalities noted Psych:  Normal affect    EKG:  The EKG was personally reviewed and demonstrates 08/18/16-atrial fibrillation with heart rate up proximally 100 bpm with no ischemic changes.  Relevant CV Studies:  Echocardiogram pending  Laboratory Data:  Chemistry Recent Labs Lab 08/15/16 0101 08/16/16 0523  NA 139 137  K 3.9 4.8  CL 109 108  CO2 23 26  GLUCOSE 115* 85  BUN 15 10  CREATININE 0.78 0.77  CALCIUM 9.2 8.4*  GFRNONAA >60 >60  GFRAA >60 >60  ANIONGAP 7 3*     Recent Labs Lab 08/15/16 0101 08/16/16 0523  PROT 7.0 6.3*  ALBUMIN 3.9 3.4*  AST 134* 161*  ALT 71* 256*  ALKPHOS 139* 144*  BILITOT 0.9 0.8   Hematology Recent Labs Lab 08/15/16 0101 08/16/16 0523  WBC 10.1 6.6  RBC 5.05 4.23  HGB 14.5 12.3  HCT 44.9 38.4  MCV 88.9 90.8  MCH 28.7 29.1  MCHC 32.3 32.0  RDW 14.6 14.8  PLT 192 173   Cardiac Enzymes Recent Labs Lab 08/15/16 0101  TROPONINI <0.03   No results for input(s): TROPIPOC in the last 168 hours.  BNPNo results for input(s): BNP, PROBNP in the last 168 hours.  DDimer No results for input(s): DDIMER in the last 168 hours.  Radiology/Studies:  Dg Cholangiogram Operative  Result Date: 08/18/2016 CLINICAL DATA:  56 year old female with a history of cholelithiasis EXAM: INTRAOPERATIVE CHOLANGIOGRAM TECHNIQUE: Cholangiographic images from the C-arm fluoroscopic device were submitted for interpretation post-operatively. Please see the procedural report for the amount of contrast and the fluoroscopy time utilized. COMPARISON:  MR 08/15/2016 FINDINGS: Surgical instruments project over the upper abdomen. There is cannulation of the cystic duct/gallbladder neck, with  antegrade infusion of contrast. Caliber of the extrahepatic ductal system within normal limits. No large filling defect identified. Contrast does not cross the ampulla, with meniscus at the distal common bile duct. IMPRESSION: Intraoperative cholangiogram demonstrates extrahepatic biliary ducts of unremarkable caliber, with no large filling defect identified. Contrast is not observed to cross the ampulla on the current study, with meniscus at the distal common bile duct. Choledocholithiasis not excluded. Please refer to the dictated operative report for full details of intraoperative findings and procedure Electronically Signed   By: Corrie Mckusick D.O.   On: 08/18/2016 16:49   Mr 3d Recon At Scanner  Result Date: 08/15/2016 CLINICAL DATA:  4.6 cm echogenic mass in the liver on  a recent limited abdomen ultrasound. EXAM: MRI ABDOMEN WITHOUT AND WITH CONTRAST (INCLUDING MRCP) TECHNIQUE: Multiplanar multisequence MR imaging of the abdomen was performed both before and after the administration of intravenous contrast. Heavily T2-weighted images of the biliary and pancreatic ducts were obtained, and three-dimensional MRCP images were rendered by post processing. CONTRAST:  11m MULTIHANCE GADOBENATE DIMEGLUMINE 529 MG/ML IV SOLN COMPARISON:  Limited abdomen ultrasound dated 08/15/2016. FINDINGS: Lower chest: Minimal right basilar atelectasis. Hepatobiliary: Diffuse low signal intensity of the liver relative to the spleen on fat suppression images. Triangular-shaped mass in the right lobe of the liver measuring 7.6 x 6.8 x 6.5 cm in maximum dimensions. This has homogeneous increased signal intensity on T2 and weighted images and homogeneously decreased signal intensity on precontrast T1 weighted images. This demonstrates peripheral contrast puddling on the postcontrast images. This corresponds to the mass seen at ultrasound. Multiple large stones are filling the gallbladder. The largest stone measures 2.3 cm in maximum  diameter. No gallbladder wall thickening or pericholecystic fluid. Pancreas:  Normal.  No mass demonstrated. Spleen:  Normal in size, shape and signal intensity. Adrenals/Urinary Tract: Normal appearing adrenal glands, kidneys and included portions of the ureters. Stomach/Bowel: The stomach and included portions of the small bowel and colon appear normal. Vascular/Lymphatic: Normal appearing aorta and its branches. No enlarged lymph nodes. Other:  None. Musculoskeletal: Unremarkable. IMPRESSION: 1. 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 2. Diffuse hepatic steatosis. 3. Multiple large gallstones filling the gallbladder without evidence of cholecystitis. The largest stone measures 2.3 cm. Electronically Signed   By: SClaudie ReveringM.D.   On: 08/15/2016 08:46   Mr Abdomen Mrcp WMoise BoringContast  Result Date: 08/15/2016 CLINICAL DATA:  4.6 cm echogenic mass in the liver on a recent limited abdomen ultrasound. EXAM: MRI ABDOMEN WITHOUT AND WITH CONTRAST (INCLUDING MRCP) TECHNIQUE: Multiplanar multisequence MR imaging of the abdomen was performed both before and after the administration of intravenous contrast. Heavily T2-weighted images of the biliary and pancreatic ducts were obtained, and three-dimensional MRCP images were rendered by post processing. CONTRAST:  256mMULTIHANCE GADOBENATE DIMEGLUMINE 529 MG/ML IV SOLN COMPARISON:  Limited abdomen ultrasound dated 08/15/2016. FINDINGS: Lower chest: Minimal right basilar atelectasis. Hepatobiliary: Diffuse low signal intensity of the liver relative to the spleen on fat suppression images. Triangular-shaped mass in the right lobe of the liver measuring 7.6 x 6.8 x 6.5 cm in maximum dimensions. This has homogeneous increased signal intensity on T2 and weighted images and homogeneously decreased signal intensity on precontrast T1 weighted images. This demonstrates peripheral contrast puddling on the postcontrast images. This corresponds to the mass seen at ultrasound. Multiple  large stones are filling the gallbladder. The largest stone measures 2.3 cm in maximum diameter. No gallbladder wall thickening or pericholecystic fluid. Pancreas:  Normal.  No mass demonstrated. Spleen:  Normal in size, shape and signal intensity. Adrenals/Urinary Tract: Normal appearing adrenal glands, kidneys and included portions of the ureters. Stomach/Bowel: The stomach and included portions of the small bowel and colon appear normal. Vascular/Lymphatic: Normal appearing aorta and its branches. No enlarged lymph nodes. Other:  None. Musculoskeletal: Unremarkable. IMPRESSION: 1. 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 2. Diffuse hepatic steatosis. 3. Multiple large gallstones filling the gallbladder without evidence of cholecystitis. The largest stone measures 2.3 cm. Electronically Signed   By: StClaudie Revering.D.   On: 08/15/2016 08:46   UsKoreabdomen Limited Ruq  Result Date: 08/15/2016 CLINICAL DATA:  Acute onset of right upper quadrant abdominal pain and vomiting. Initial encounter.  EXAM: US ABDOMEN LIMITED - RIGHT UPPER QUADRANT COMPARISON:  None. FINDINGS: Gallbladder: The gallbladder is filled with stones, with a wall echo shadow sign. No definite gallbladder wall thickening or pericholecystic fluid is seen. No ultrasonographic Murphy's sign is elicited. Common bile duct: Diameter: 0.3 cm, within normal limits in caliber. Liver: A large echogenic lesion is noted at the right hepatic lobe, measuring 4.6 x 4.2 x 3.8 cm. Though this could reflect an hemangioma or adenoma, malignancy cannot be entirely excluded. Otherwise within normal limits in parenchymal echogenicity. IMPRESSION: 1. Gallbladder filled with stones, with a wall echo shadow sign. No evidence for obstruction or cholecystitis. 2. Large 4.6 cm echogenic lesion at the right hepatic lobe. Though this could reflect an hemangioma or adenoma, malignancy cannot be entirely excluded. Dynamic liver protocol MRI or CT would be helpful for further  evaluation. Electronically Signed   By: Garald Balding M.D.   On: 08/15/2016 02:37    Assessment and Plan:   Postop atrial fibrillation  - Agree with IV Cardizem drip. Step down.  - Hopefully in the next 24-48 hours, she will convert  - Commonly seen secondary to the inflammatory process  - She has risk factors for atrial fibrillation such as obesity, age, tobacco use  - She notes occasional palpitations at home. Question if she potentially may be going in and out of atrial fibrillation or perhaps these are just PVCs or PACs.  - If atrial fibrillation continues for greater than 24 hours postoperatively, we will begin anticoagulation, would like to use novel oral anticoagulant such as Eliquis 5 mg twice a day if possible.   - If Cardizem does not adequately control heart rate in the evening, one could consider IV the Toprol, as beta blocker sometimes decrease the adrenergic effects/inflammatory effects.  - I will order an echocardiogram.  Obesity  - Encourage weight loss.  Gallstone pancreatitis  - Postcholecystectomy  - LFTs are decreasing.  Tobacco use  - Encourage cessation.   Signed, Candee Furbish, MD  08/18/2016 5:12 PM

## 2016-08-18 NOTE — Op Note (Signed)
Tammy Davies   08/18/2016  4:16 PM  Procedure: Laparoscopic Cholecystectomy with intraoperative cholangiogram  Surgeon: Catalina Antigua B. Hassell Done, MD, FACS Asst:  none  Anes:  General  Drains:  None  Findings: Subacute and chronic cholecystitis; IOC showed non emptying of the common bile duct  Description of Procedure: The patient was taken to OR 4 and given general anesthesia.  The patient was prepped with PCMX and draped sterilely. A time out was performed.  Access to the abdomen was achieved with a 5 mm Optiview in the left upper quadrant.  Port placement included four 5 mm trocars and one 11 mm trocar.    The gallbladder was visualized and the fundus was grasped and the gallbladder was elevated. Traction on the infundibulum allowed for successful demonstration of the critical view. Inflammatory changes were chronic.  The cystic duct was identified and clipped up on the gallbladder and an incision was made in the cystic duct and the Reddick catheter was inserted after milking the cystic duct of any debris. A dynamic cholangiogram was performed which demonstrated intrahepatic filling but no flow into the duodenum.    The cystic duct was then triple clipped and divided, the cystic artery was double clipped and divided and then the gallbladder was removed from the gallbladder bed. Removal of the gallbladder from the gallbladder bed was tedious because of the thickening and edema.  The gallbladder was then placed in a bag and brought out through one of the trocar sites.  The fascia of that port site was closed with a 0 vicryl.   The gallbladder bed was inspected and no bleeding or bile leaks were seen.   Laparoscopic visualization was used when closing fascial defects for trocar sites.   Incisions were injected with Exparel  and closed with 4-0 Monocryl and Dermabond on the skin.  Sponge and needle count were correct.    The patient was taken to the recovery room in satisfactory condition.

## 2016-08-18 NOTE — Transfer of Care (Signed)
Immediate Anesthesia Transfer of Care Note  Patient: Tammy Davies  Procedure(s) Performed: Procedure(s): LAPAROSCOPIC CHOLECYSTECTOMY WITH  INTRAOPERATIVE CHOLANGIOGRAM (N/A)  Patient Location: PACU  Anesthesia Type:General  Level of Consciousness: sedated and patient cooperative  Airway & Oxygen Therapy: Patient Spontanous Breathing and Patient connected to face mask oxygen  Post-op Assessment: Report given to RN and Post -op Vital signs reviewed and stable  Post vital signs: Reviewed and stable  Last Vitals:  Vitals:   08/17/16 2045 08/18/16 0548  BP: 131/64 (!) 146/90  Pulse: (!) 56 (!) 52  Resp: 17 20  Temp: 37.1 C 36.7 C    Last Pain:  Vitals:   08/18/16 1203  TempSrc:   PainSc: 0-No pain      Patients Stated Pain Goal: 2 (54/65/68 1275)  Complications: No apparent anesthesia complications

## 2016-08-18 NOTE — Interval H&P Note (Signed)
History and Physical Interval Note:  08/18/2016 1:46 PM  Tammy Davies  has presented today for surgery, with the diagnosis of gallstone pancreatitis  The various methods of treatment have been discussed with the patient and family. After consideration of risks, benefits and other options for treatment, the patient has consented to  Procedure(s): LAPAROSCOPIC CHOLECYSTECTOMY WITH POSSIBLE INTRAOPERATIVE CHOLANGIOGRAM (N/A) as a surgical intervention .  The patient's history has been reviewed, patient examined, no change in status, stable for surgery.  I have reviewed the patient's chart and labs.  Questions were answered to the patient's satisfaction.     Shoji Pertuit B

## 2016-08-19 ENCOUNTER — Encounter (HOSPITAL_COMMUNITY): Payer: Self-pay | Admitting: Surgery

## 2016-08-19 ENCOUNTER — Inpatient Hospital Stay (HOSPITAL_COMMUNITY): Payer: Self-pay

## 2016-08-19 DIAGNOSIS — I4891 Unspecified atrial fibrillation: Secondary | ICD-10-CM

## 2016-08-19 LAB — COMPREHENSIVE METABOLIC PANEL
ALK PHOS: 150 U/L — AB (ref 38–126)
ALT: 136 U/L — AB (ref 14–54)
ALT: 111 U/L — ABNORMAL HIGH (ref 14–54)
ANION GAP: 6 (ref 5–15)
AST: 111 U/L — AB (ref 15–41)
AST: 55 U/L — ABNORMAL HIGH (ref 15–41)
Albumin: 3.8 g/dL (ref 3.5–5.0)
Albumin: 3.8 g/dL (ref 3.5–5.0)
Alkaline Phosphatase: 141 U/L — ABNORMAL HIGH (ref 38–126)
Anion gap: 6 (ref 5–15)
BUN: 6 mg/dL (ref 6–20)
BUN: 9 mg/dL (ref 6–20)
CHLORIDE: 104 mmol/L (ref 101–111)
CHLORIDE: 105 mmol/L (ref 101–111)
CO2: 26 mmol/L (ref 22–32)
CO2: 27 mmol/L (ref 22–32)
CREATININE: 0.78 mg/dL (ref 0.44–1.00)
Calcium: 9 mg/dL (ref 8.9–10.3)
Calcium: 9.4 mg/dL (ref 8.9–10.3)
Creatinine, Ser: 0.71 mg/dL (ref 0.44–1.00)
Glucose, Bld: 160 mg/dL — ABNORMAL HIGH (ref 65–99)
Glucose, Bld: 105 mg/dL — ABNORMAL HIGH (ref 65–99)
Potassium: 4.3 mmol/L (ref 3.5–5.1)
Potassium: 4.6 mmol/L (ref 3.5–5.1)
SODIUM: 136 mmol/L (ref 135–145)
Sodium: 138 mmol/L (ref 135–145)
Total Bilirubin: 0.7 mg/dL (ref 0.3–1.2)
Total Bilirubin: 0.4 mg/dL (ref 0.3–1.2)
Total Protein: 6.9 g/dL (ref 6.5–8.1)
Total Protein: 6.8 g/dL (ref 6.5–8.1)

## 2016-08-19 LAB — ECHOCARDIOGRAM COMPLETE
Height: 68 in
Weight: 3760 oz

## 2016-08-19 LAB — CBC
HCT: 39.7 % (ref 36.0–46.0)
Hemoglobin: 13.1 g/dL (ref 12.0–15.0)
MCH: 28.7 pg (ref 26.0–34.0)
MCHC: 33 g/dL (ref 30.0–36.0)
MCV: 86.9 fL (ref 78.0–100.0)
PLATELETS: 199 10*3/uL (ref 150–400)
RBC: 4.57 MIL/uL (ref 3.87–5.11)
RDW: 14 % (ref 11.5–15.5)
WBC: 9.4 10*3/uL (ref 4.0–10.5)

## 2016-08-19 MED ORDER — HYDROMORPHONE HCL 1 MG/ML IJ SOLN
1.0000 mg | INTRAMUSCULAR | Status: DC | PRN
  Administered 2016-08-19 – 2016-08-20 (×5): 1 mg via INTRAVENOUS
  Filled 2016-08-19 (×5): qty 1

## 2016-08-19 MED ORDER — IBUPROFEN 200 MG PO TABS
600.0000 mg | ORAL_TABLET | Freq: Four times a day (QID) | ORAL | Status: DC | PRN
  Administered 2016-08-20: 600 mg via ORAL
  Filled 2016-08-19: qty 3

## 2016-08-19 MED ORDER — PERFLUTREN LIPID MICROSPHERE
1.0000 mL | INTRAVENOUS | Status: AC | PRN
  Administered 2016-08-19: 4 mL via INTRAVENOUS
  Filled 2016-08-19: qty 10

## 2016-08-19 MED ORDER — PERFLUTREN LIPID MICROSPHERE
INTRAVENOUS | Status: AC
  Filled 2016-08-19: qty 10

## 2016-08-19 NOTE — Care Management Note (Signed)
Case Management Note  Patient Details  Name: KYLANI WIRES MRN: [id number] Date of Birth: December 17, 1960  Subjective/Objective:         arrhthymias s/p surg           Action/Plan:Date:  August 19, 2016 Chart reviewed for concurrent status and case management needs. Will continue to follow patient progress. Discharge Planning: following for needs Expected discharge date: [id number] Velva Harman, BSN, [place], [place]   Expected Discharge Date:                  Expected Discharge Plan:  Home/Self Care  In-House Referral:     Discharge planning Services     Post Acute Care Choice:    Choice offered to:     DME Arranged:    DME Agency:     HH Arranged:    [place] Agency:     Status of Service:  In process, will continue to follow  If discussed at Long Length of Stay Meetings, dates discussed:    Additional Comments:  Leeroy Cha, RN 08/19/2016, 10:24 AM

## 2016-08-19 NOTE — Progress Notes (Signed)
PROGRESS NOTE  Tammy Davies ZDG:[id number] DOB: [birth year] DOA: 08/15/2016 PCP: No PCP Per Patient   LOS: 4 days   Brief Narrative: 56 y.o. female with no significant past medical history who presents with acute epigastric pain, found to have acute pancreatitis  Assessment & Plan: Principal Problem:   Pancreatitis Active Problems:   Liver mass, right lobe   Acute gallstone pancreatitis -Improving, no further abdominal pain/emesis, -General surgery was consulted and plan to do laparoscopic cholecystectomy on 4/3 -MRI/MRCP shows multiple large gallstones within the gallbladder  New onset A. fib with RVR -Patient was noted postop developed A. fib with RVR, requiring Cardizem infusion.  Cardiology was consulted by the surgical team at that time.  Patient was on diltiazem infusion, and converted back to sinus rhythm within a few hours after the surgery, however required transfer to stepdown -2D echo today with normal EF 66 5%, normal diastolic function -Given short duration and treatment by surgery, cardiology recommends no anticoagulation at this point, and will need to have at home 60 mg of short-acting Cardizem as needed for palpitations.  ? 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   Objective: Vitals:   08/19/16 0400 08/19/16 0500 08/19/16 0700 08/19/16 0900  BP: 124/65 (!) 146/67  (!) 126/56  Pulse: (!) 58 (!) 53  66  Resp: 17 16  19     Temp:   98 F (36.7 C)   TempSrc:   Oral   SpO2: 95% 91%  95%  Weight:      Height:        Intake/Output Summary (Last 24 hours) at 08/19/16 1100 Last data filed at 08/19/16 0800  Gross per 24 hour  Intake          3845.82 ml  Output             2821 ml  Net          1024.82 ml   Filed Weights   08/15/16 0612  Weight: 106.6 kg (235 lb)    Examination: Constitutional: NAD Vitals:   08/19/16 0400 08/19/16 0500 08/19/16 0700 08/19/16 0900  BP: 124/65 (!) 146/67  (!) 126/56  Pulse: (!) 58 (!) 53  66  Resp: 17 16  19   Temp:   98 F (36.7 C)   TempSrc:   Oral   SpO2: 95% 91%  95%  Weight:      Height:       Eyes: PERRL, lids and conjunctivae normal ENMT: Mucous membranes are moist. No oropharyngeal exudates Respiratory: clear to auscultation bilaterally, no wheezing, no crackles.  Cardiovascular: Regular rate and rhythm, no murmurs / rubs / gallops.  Abdomen: no tenderness. Bowel sounds positive.  Neurologic: non focal    Data Reviewed: I have personally reviewed following labs and imaging studies  CBC:  Recent Labs Lab 08/15/16 0101 08/16/16 0523 08/18/16 1830 08/19/16 0345  WBC 10.1 6.6 10.6* 9.4  NEUTROABS 8.7*  --   --   --  HGB 14.5 12.3 13.6 13.1  HCT 44.9 38.4 41.6 39.7  MCV 88.9 90.8 84.9 86.9  PLT 192 173 200 175   Basic Metabolic Panel:  Recent Labs Lab 08/15/16 0101 08/16/16 0523 08/18/16 1830 08/19/16 0345  NA 139 137  --  136  K 3.9 4.8  --  4.3  CL 109 108  --  104  CO2 23 26  --  26  GLUCOSE 115* 85  --  160*  BUN 15 10  --  6  CREATININE 0.78 0.77 0.68 0.78  CALCIUM 9.2 8.4*  --  9.0   GFR: Estimated Creatinine Clearance: 101.6 mL/min (by C-G formula based on SCr of 0.78 mg/dL). Liver Function Tests:  Recent Labs Lab 08/15/16 0101 08/16/16 0523 08/19/16 0345  AST 134* 161* 111*  ALT 71* 256* 136*  ALKPHOS 139* 144* 150*  BILITOT 0.9 0.8 0.7  PROT 7.0 6.3* 6.9  ALBUMIN 3.9 3.4* 3.8    Recent Labs Lab  08/15/16 0307 08/16/16 0523  LIPASE 1,012* 66*   No results for input(s): AMMONIA in the last 168 hours. Coagulation Profile: No results for input(s): INR, PROTIME in the last 168 hours. Cardiac Enzymes:  Recent Labs Lab 08/15/16 0101  TROPONINI <0.03   BNP (last 3 results) No results for input(s): PROBNP in the last 8760 hours. HbA1C: No results for input(s): HGBA1C in the last 72 hours. CBG: No results for input(s): GLUCAP in the last 168 hours. Lipid Profile: No results for input(s): CHOL, HDL, LDLCALC, TRIG, CHOLHDL, LDLDIRECT in the last 72 hours. Thyroid Function Tests: No results for input(s): TSH, T4TOTAL, FREET4, T3FREE, THYROIDAB in the last 72 hours. Anemia Panel: No results for input(s): VITAMINB12, FOLATE, FERRITIN, TIBC, IRON, RETICCTPCT in the last 72 hours. Urine analysis:    Component Value Date/Time   COLORURINE YELLOW 08/15/2016 0040   APPEARANCEUR CLEAR 08/15/2016 0040   LABSPEC 1.021 08/15/2016 0040   PHURINE 5.0 08/15/2016 0040   GLUCOSEU NEGATIVE 08/15/2016 0040   HGBUR SMALL (A) 08/15/2016 0040   BILIRUBINUR NEGATIVE 08/15/2016 0040   KETONESUR NEGATIVE 08/15/2016 0040   PROTEINUR NEGATIVE 08/15/2016 0040   NITRITE NEGATIVE 08/15/2016 0040   LEUKOCYTESUR NEGATIVE 08/15/2016 0040   Sepsis Labs: Invalid input(s): PROCALCITONIN, LACTICIDVEN  Recent Results (from the past 240 hour(s))  MRSA PCR Screening     Status: None   Collection Time: 08/15/16  6:40 AM  Result Value Ref Range Status   MRSA by PCR NEGATIVE NEGATIVE Final    Comment:        The GeneXpert MRSA Assay (FDA approved for NASAL specimens only), is one component of a comprehensive MRSA colonization surveillance program. It is not intended to diagnose MRSA infection nor to guide or monitor treatment for MRSA infections.   MRSA PCR Screening     Status: None   Collection Time: 08/18/16  6:08 PM  Result Value Ref Range Status   MRSA by PCR NEGATIVE NEGATIVE Final     Comment:        The GeneXpert MRSA Assay (FDA approved for NASAL specimens only), is one component of a comprehensive MRSA colonization surveillance program. It is not intended to diagnose MRSA infection nor to guide or monitor treatment for MRSA infections.     Radiology Studies: Dg Cholangiogram Operative  Result Date: 08/18/2016 CLINICAL DATA:  56 year old female with a history of cholelithiasis EXAM: INTRAOPERATIVE CHOLANGIOGRAM TECHNIQUE: Cholangiographic images from the C-arm fluoroscopic device were submitted for interpretation post-operatively. Please see the procedural report for  the amount of contrast and the fluoroscopy time utilized. COMPARISON:  MR 08/15/2016 FINDINGS: Surgical instruments project over the upper abdomen. There is cannulation of the cystic duct/gallbladder neck, with antegrade infusion of contrast. Caliber of the extrahepatic ductal system within normal limits. No large filling defect identified. Contrast does not cross the ampulla, with meniscus at the distal common bile duct. IMPRESSION: Intraoperative cholangiogram demonstrates extrahepatic biliary ducts of unremarkable caliber, with no large filling defect identified. Contrast is not observed to cross the ampulla on the current study, with meniscus at the distal common bile duct. Choledocholithiasis not excluded. Please refer to the dictated operative report for full details of intraoperative findings and procedure Electronically Signed   By: Corrie Mckusick D.O.   On: 08/18/2016 16:49     Scheduled Meds: . heparin subcutaneous  5,000 Units Subcutaneous Q8H  . pantoprazole (PROTONIX) IV  40 mg Intravenous QHS   Continuous Infusions: . dextrose 5 % and 0.45 % NaCl with KCl 20 mEq/L 100 mL/hr at 08/19/16 0800    Marzetta Board, MD, PhD [facility] Pager [phone]  If 7PM-7AM, please contact night-coverage [email or web address] Password [email or web address] 08/19/2016, 11:00 AM

## 2016-08-19 NOTE — Progress Notes (Signed)
Progress Note  Patient Name: Tammy Davies Date of Encounter: 08/19/2016  Primary Cardiologist: New Dr. Marlou Porch  Subjective   Pt feeling well this morning. Getting ready to walk in the hall. No further palpitations, no chest pain or shortness of breath.   Inpatient Medications    Scheduled Meds: . heparin subcutaneous  5,000 Units Subcutaneous Q8H  . pantoprazole (PROTONIX) IV  40 mg Intravenous QHS   Continuous Infusions: . dextrose 5 % and 0.45 % NaCl with KCl 20 mEq/L 100 mL/hr at 08/19/16 0800   PRN Meds: HYDROcodone-acetaminophen, HYDROmorphone (DILAUDID) injection, ondansetron **OR** ondansetron (ZOFRAN) IV, perflutren lipid microspheres (DEFINITY) IV suspension   Vital Signs    Vitals:   08/19/16 0400 08/19/16 0500 08/19/16 0700 08/19/16 0900  BP: 124/65 (!) 146/67  (!) 126/56  Pulse: (!) 58 (!) 53  66  Resp: 17 16  19   Temp:   98 F (36.7 C)   TempSrc:   Oral   SpO2: 95% 91%  95%  Weight:      Height:        Intake/Output Summary (Last 24 hours) at 08/19/16 0956 Last data filed at 08/19/16 0800  Gross per 24 hour  Intake          3845.82 ml  Output             3421 ml  Net           424.82 ml   Filed Weights   08/15/16 0612  Weight: 235 lb (106.6 kg)    Telemetry    Sinus rhythm in the 60's - Personally Reviewed  ECG    No new tracings  Physical Exam   GEN: No acute distress.   Neck: No JVD Cardiac: RRR, no murmurs, rubs, or gallops.  Respiratory: Clear to auscultation bilaterally. GI: Soft, nontender, non-distended  MS: No edema; No deformity. Neuro:  Nonfocal  Psych: Normal affect   Labs    Chemistry Recent Labs Lab 08/15/16 0101 08/16/16 0523 08/18/16 1830 08/19/16 0345  NA 139 137  --  136  K 3.9 4.8  --  4.3  CL 109 108  --  104  CO2 23 26  --  26  GLUCOSE 115* 85  --  160*  BUN 15 10  --  6  CREATININE 0.78 0.77 0.68 0.78  CALCIUM 9.2 8.4*  --  9.0  PROT 7.0 6.3*  --  6.9  ALBUMIN 3.9 3.4*  --  3.8  AST 134*  161*  --  111*  ALT 71* 256*  --  136*  ALKPHOS 139* 144*  --  150*  BILITOT 0.9 0.8  --  0.7  GFRNONAA >60 >60 >60 >60  GFRAA >60 >60 >60 >60  ANIONGAP 7 3*  --  6     Hematology Recent Labs Lab 08/16/16 0523 08/18/16 1830 08/19/16 0345  WBC 6.6 10.6* 9.4  RBC 4.23 4.90 4.57  HGB 12.3 13.6 13.1  HCT 38.4 41.6 39.7  MCV 90.8 84.9 86.9  MCH 29.1 27.8 28.7  MCHC 32.0 32.7 33.0  RDW 14.8 13.9 14.0  PLT 173 200 199    Cardiac Enzymes Recent Labs Lab 08/15/16 0101  TROPONINI <0.03   No results for input(s): TROPIPOC in the last 168 hours.   BNPNo results for input(s): BNP, PROBNP in the last 168 hours.   DDimer No results for input(s): DDIMER in the last 168 hours.   Radiology    Dg Cholangiogram Operative  Result Date: 08/18/2016 CLINICAL DATA:  56 year old female with a history of cholelithiasis EXAM: INTRAOPERATIVE CHOLANGIOGRAM TECHNIQUE: Cholangiographic images from the C-arm fluoroscopic device were submitted for interpretation post-operatively. Please see the procedural report for the amount of contrast and the fluoroscopy time utilized. COMPARISON:  MR 08/15/2016 FINDINGS: Surgical instruments project over the upper abdomen. There is cannulation of the cystic duct/gallbladder neck, with antegrade infusion of contrast. Caliber of the extrahepatic ductal system within normal limits. No large filling defect identified. Contrast does not cross the ampulla, with meniscus at the distal common bile duct. IMPRESSION: Intraoperative cholangiogram demonstrates extrahepatic biliary ducts of unremarkable caliber, with no large filling defect identified. Contrast is not observed to cross the ampulla on the current study, with meniscus at the distal common bile duct. Choledocholithiasis not excluded. Please refer to the dictated operative report for full details of intraoperative findings and procedure Electronically Signed   By: Corrie Mckusick D.O.   On: 08/18/2016 16:49    Cardiac  Studies   Echo pending.  Patient Profile     56 y.o. female with no significant prior past medical history, no prior heart history who was admitted with gallstone pancreatitis, lipase of 1000 who underwent cholecystectomy on 08/17/16 by Dr. Hassell Done. Toward the end of the procedure, her heart rate was noted to be irregular and atrial fibrillation was diagnosed. IV cardizem was initiated with conversion to sinus rhythm and the drip was stopped.   Assessment & Plan    Postop atrial fibrillation -Atrial fib noted post op lap chole. This is commonly seen with inflammatory processes. She converted to NSR with IV cardizem which was then stopped last night with maintenance of sinus rhythm through the night. The afib was likely in response to the stress of surgery and anesthesia. It converted easily and should remain in sinus rhythm without any intervention.  -There is no need for anti-coagulation given the short duration.  -She notes occasional palpitations at home. Question if she potentially may be going in and out of atrial fibrillation or perhaps these are just PVCs or PACs. She can try cardizem 60 mg short acting as needed for palpitaiton along with pushin fluids and rest if she develops irregular heart beat. She is advised to seek medical attention if her heart rate stays irregular.  -Preliminary read of echo shows normal EF. -Advised her to obtain a PCP and follow up.   Obesity -Encourage weight loss  Gallstone pancreatitis -S/P cholecystectomy -LFT's decreasing  Tobacco use -Encourage cessation. She states that she is ready and is liking the nicotine patch.   Signed, Daune Perch, NP  08/19/2016, 9:56 AM    Personally seen and examined. Agree with above.  Feels better. No shortness of breath, no chest pain. She is alert and oriented 3, regular rate and rhythm, actually slightly bradycardic regular, no murmurs, lungs are clear, no edema.  Postoperative atrial fibrillation  -  Converted to normal sinus rhythm several hours after surgery. Diltiazem is off.  - Could consider diltiazem 60 mg by mouth twice a day when necessary tachycardia arrhythmia at home. This is not 100% necessary though.  - Echocardiogram reassuring with normal EF, normal left atrial size.  - Palpitations at home may be PVCs or PACs, she describes them as fleeting.  - Continue to encourage weight loss, exercise and continued tobacco cessation.  We will sign off. Please let us know if we can be of further assistance.  Candee Furbish, MD

## 2016-08-19 NOTE — Progress Notes (Signed)
  Echocardiogram 2D Echocardiogram with definity has been performed.  Tammy Davies M 08/19/2016, 8:57 AM

## 2016-08-19 NOTE — Progress Notes (Signed)
1 Day Post-Op  Subjective: CC:  Gallstone Pancreatitis with Subacute and chronic cholecystitis She looks good and it appears she is going to telemetry.  Back in SR now.  Some pain but not that much.    Objective: Vital signs in last 24 hours: Temp:  [97.5 F (36.4 C)-98.2 F (36.8 C)] 98 F (36.7 C) (04/04 0700) Pulse Rate:  [53-119] 59 (04/04 1200) Resp:  [12-19] 17 (04/04 1200) BP: (100-146)/(53-93) 129/70 (04/04 1200) SpO2:  [91 %-99 %] 94 % (04/04 1200) Last BM Date: 08/18/16 NPO 2500 IV Urine 3395 Afebrile, VSS Labs OK LFT's, Improving IOC:  Intraoperative cholangiogram demonstrates extrahepatic biliary ducts of unremarkable caliber, with no large filling defect identified. Contrast is not observed to cross the ampulla on the current study, with meniscus at the distal common bile duct. Choledocholithiasis not excluded.     Intake/Output from previous day: 04/03 0701 - 04/04 0700 In: 2512.5 [I.V.:2262.5; IV Piggyback:250] Out: 1610 [Urine:3395; Blood:25] Intake/Output this shift: Total I/O In: 1733.3 [I.V.:1733.3] Out: 251 [Urine:251]  General appearance: alert, cooperative and no distress Resp: clear to auscultation bilaterally GI: soft, sore, sites all look fine.  + BS, large abdomen.  Lab Results:   Recent Labs  08/18/16 1830 08/19/16 0345  WBC 10.6* 9.4  HGB 13.6 13.1  HCT 41.6 39.7  PLT 200 199    BMET  Recent Labs  08/18/16 1830 08/19/16 0345  NA  --  136  K  --  4.3  CL  --  104  CO2  --  26  GLUCOSE  --  160*  BUN  --  6  CREATININE 0.68 0.78  CALCIUM  --  9.0   PT/INR No results for input(s): LABPROT, INR in the last 72 hours.   Recent Labs Lab 08/15/16 0101 08/16/16 0523 08/19/16 0345  AST 134* 161* 111*  ALT 71* 256* 136*  ALKPHOS 139* 144* 150*  BILITOT 0.9 0.8 0.7  PROT 7.0 6.3* 6.9  ALBUMIN 3.9 3.4* 3.8     Lipase     Component Value Date/Time   LIPASE 66 (H) 08/16/2016 0523     Studies/Results: Dg  Cholangiogram Operative  Result Date: 08/18/2016 CLINICAL DATA:  56 year old female with a history of cholelithiasis EXAM: INTRAOPERATIVE CHOLANGIOGRAM TECHNIQUE: Cholangiographic images from the C-arm fluoroscopic device were submitted for interpretation post-operatively. Please see the procedural report for the amount of contrast and the fluoroscopy time utilized. COMPARISON:  MR 08/15/2016 FINDINGS: Surgical instruments project over the upper abdomen. There is cannulation of the cystic duct/gallbladder neck, with antegrade infusion of contrast. Caliber of the extrahepatic ductal system within normal limits. No large filling defect identified. Contrast does not cross the ampulla, with meniscus at the distal common bile duct. IMPRESSION: Intraoperative cholangiogram demonstrates extrahepatic biliary ducts of unremarkable caliber, with no large filling defect identified. Contrast is not observed to cross the ampulla on the current study, with meniscus at the distal common bile duct. Choledocholithiasis not excluded. Please refer to the dictated operative report for full details of intraoperative findings and procedure Electronically Signed   By: Corrie Mckusick D.O.   On: 08/18/2016 16:49    Medications: . heparin subcutaneous  5,000 Units Subcutaneous Q8H  . pantoprazole (PROTONIX) IV  40 mg Intravenous QHS   . dextrose 5 % and 0.45 % NaCl with KCl 20 mEq/L 100 mL/hr at 08/19/16 1200   Assessment/Plan Gallstone pancreatitis with Subacute and chronic cholecystitis Laparoscopic cholecystectomy with IOC, 08/18/16, Dr. Johnathan Hausen  POD1 Post op  Atrial Fibrillation Tobacco use Body mass index is 35.7 FEN:  IV fluids/ Full liquids ID:  Preop Cefazolin  DVT:  heparin    Plan:  Advance diet, PO pain meds and see how she does.  Recheck LFT's in AM.   LOS: 4 days    Lambert Jeanty 08/19/2016 [phone]

## 2016-08-20 DIAGNOSIS — Z9049 Acquired absence of other specified parts of digestive tract: Secondary | ICD-10-CM

## 2016-08-20 DIAGNOSIS — Z720 Tobacco use: Secondary | ICD-10-CM

## 2016-08-20 DIAGNOSIS — I480 Paroxysmal atrial fibrillation: Secondary | ICD-10-CM

## 2016-08-20 LAB — COMPREHENSIVE METABOLIC PANEL
ALT: 87 U/L — ABNORMAL HIGH (ref 14–54)
ANION GAP: 7 (ref 5–15)
AST: 35 U/L (ref 15–41)
Albumin: 3.6 g/dL (ref 3.5–5.0)
Alkaline Phosphatase: 121 U/L (ref 38–126)
BUN: 15 mg/dL (ref 6–20)
CO2: 27 mmol/L (ref 22–32)
Calcium: 9 mg/dL (ref 8.9–10.3)
Chloride: 104 mmol/L (ref 101–111)
Creatinine, Ser: 0.78 mg/dL (ref 0.44–1.00)
GFR calc Af Amer: >60 mL/min (ref >60)
GFR calc non Af Amer: >60 mL/min (ref >60)
GLUCOSE: 89 mg/dL (ref 65–99)
POTASSIUM: 4.1 mmol/L (ref 3.5–5.1)
SODIUM: 138 mmol/L (ref 135–145)
TOTAL PROTEIN: 6.4 g/dL — AB (ref 6.5–8.1)
Total Bilirubin: 0.6 mg/dL (ref 0.3–1.2)

## 2016-08-20 LAB — CBC
HEMATOCRIT: 36.8 % (ref 36.0–46.0)
HEMOGLOBIN: 11.8 g/dL — AB (ref 12.0–15.0)
MCH: 28.6 pg (ref 26.0–34.0)
MCHC: 32.1 g/dL (ref 30.0–36.0)
MCV: 89.3 fL (ref 78.0–100.0)
Platelets: 190 10*3/uL (ref 150–400)
RBC: 4.12 MIL/uL (ref 3.87–5.11)
RDW: 14.5 % (ref 11.5–15.5)
WBC: 7.7 10*3/uL (ref 4.0–10.5)

## 2016-08-20 MED ORDER — OXYCODONE HCL 5 MG PO TABS
5.0000 mg | ORAL_TABLET | ORAL | Status: DC | PRN
  Administered 2016-08-20: 5 mg via ORAL
  Filled 2016-08-20: qty 1

## 2016-08-20 MED ORDER — PANTOPRAZOLE SODIUM 40 MG PO TBEC: 40.0000 mg | DELAYED_RELEASE_TABLET | Freq: Every day | ORAL | Status: DC

## 2016-08-20 MED ORDER — NICOTINE 21 MG/24HR TD PT24: 21.0000 mg | MEDICATED_PATCH | Freq: Every day | TRANSDERMAL | 0 refills | Status: DC

## 2016-08-20 MED ORDER — OXYCODONE HCL 5 MG PO TABS: 5.0000 mg | ORAL_TABLET | ORAL | 0 refills | Status: DC | PRN

## 2016-08-20 NOTE — Progress Notes (Signed)
[facility] Progress Note  2 Days Post-Op  Subjective: CC abdominal soreness with movement. Denies nausea/vomiting. Tolerating PO. Ambulating. +flatus. Denies BM.  Objective: Vital signs in last 24 hours: Temp:  [98 F (36.7 C)-98.4 F (36.9 C)] 98.4 F (36.9 C) (04/05 0447) Pulse Rate:  [51-64] 51 (04/05 0447) Resp:  [12-23] 18 (04/05 0447) BP: (99-141)/(53-79) 109/66 (04/05 0447) SpO2:  [91 %-97 %] 96 % (04/05 0447) Last BM Date: 08/18/16  Intake/Output from previous day: 04/04 0701 - 04/05 0700 In: 2522.5 [I.V.:2522.5] Out: 451 [Urine:451] Intake/Output this shift: No intake/output data recorded.  PE: Gen:  Alert, NAD, pleasant Card:  Regular rate and rhythm, pedal pulses palpable  Pulm:  Normal respiratory effort, clear to auscultation bilaterally Abd: Soft, obese, appropriately tender, bowel sounds present in all 4 quadrants, incisions C/D/I w/ mild ecchymosis at superior incision, no hernias.  Ext:  No erythema, edema, or tenderness  Lab Results:   Recent Labs  08/19/16 0345 08/20/16 0452  WBC 9.4 7.7  HGB 13.1 11.8*  HCT 39.7 36.8  PLT 199 190   BMET  Recent Labs  08/19/16 1433 08/20/16 0452  NA 138 138  K 4.6 4.1  CL 105 104  CO2 27 27  GLUCOSE 105* 89  BUN 9 15  CREATININE 0.71 0.78  CALCIUM 9.4 9.0   PT/INR No results for input(s): LABPROT, INR in the last 72 hours. CMP     Component Value Date/Time   NA 138 08/20/2016 0452   K 4.1 08/20/2016 0452   CL 104 08/20/2016 0452   CO2 27 08/20/2016 0452   GLUCOSE 89 08/20/2016 0452   BUN 15 08/20/2016 0452   CREATININE 0.78 08/20/2016 0452   CALCIUM 9.0 08/20/2016 0452   PROT 6.4 (L) 08/20/2016 0452   ALBUMIN 3.6 08/20/2016 0452   AST 35 08/20/2016 0452   ALT 87 (H) 08/20/2016 0452   ALKPHOS 121 08/20/2016 0452   BILITOT 0.6 08/20/2016 0452   GFRNONAA >60 08/20/2016 0452   GFRAA >60 08/20/2016 0452   Lipase     Component Value Date/Time   LIPASE 66 (H) 08/16/2016 0523        Studies/Results: Dg Cholangiogram Operative  Result Date: 08/18/2016 CLINICAL DATA:  56 year old female with a history of cholelithiasis EXAM: INTRAOPERATIVE CHOLANGIOGRAM TECHNIQUE: Cholangiographic images from the C-arm fluoroscopic device were submitted for interpretation post-operatively. Please see the procedural report for the amount of contrast and the fluoroscopy time utilized. COMPARISON:  MR 08/15/2016 FINDINGS: Surgical instruments project over the upper abdomen. There is cannulation of the cystic duct/gallbladder neck, with antegrade infusion of contrast. Caliber of the extrahepatic ductal system within normal limits. No large filling defect identified. Contrast does not cross the ampulla, with meniscus at the distal common bile duct. IMPRESSION: Intraoperative cholangiogram demonstrates extrahepatic biliary ducts of unremarkable caliber, with no large filling defect identified. Contrast is not observed to cross the ampulla on the current study, with meniscus at the distal common bile duct. Choledocholithiasis not excluded. Please refer to the dictated operative report for full details of intraoperative findings and procedure Electronically Signed   By: Corrie Mckusick D.O.   On: 08/18/2016 16:49    Anti-infectives: Anti-infectives    Start     Dose/Rate Route Frequency Ordered Stop   08/18/16 2200  ceFAZolin (ANCEF) IVPB 2g/100 mL premix     2 g 200 mL/hr over 30 Minutes Intravenous Every 8 hours 08/18/16 1807 08/18/16 2314   08/18/16 1400  ceFAZolin (ANCEF) 2-4 GM/100ML-% IVPB  Comments:  Harle Stanford   : cabinet override      08/18/16 1400 08/19/16 0214       Assessment/Plan Gallstone pancreatitis with Subacute and chronic cholecystitis Laparoscopic cholecystectomy with IOC, 08/18/16, Dr. Johnathan Hausen  POD2 - afebrile, VSS, LFT's normalizing, total bilirubin normal, no leukocytosis - pain controlled on PO meds - having signs of bowel function   Post op Atrial  Fibrillation - currently in sinus rhythm Tobacco use Body mass index is 35.7 FEN:  IV fluids/ Regular diet ID:  Preop Cefazolin  DVT:  heparin    Plan: stable for discharge from a surgical perspective  Follow up scheduled. Work note provided.   Of note - patient feels the vicodin does not improve her pain. I would recommend scheduled tylenol and ibuprofen as well as oxycodone 5-10 mg q 6h PRN. Typically we discharge patients home with 15-20 tabs for laparoscopic surgery.    LOS: 5 days    [facility] 08/20/2016, 9:41 AM Pager: [phone] Consults: [phone] Mon-Fri 7:00 am-4:30 pm Sat-Sun 7:00 am-11:30 am

## 2016-08-20 NOTE — Progress Notes (Signed)
Completed D/C teaching with patient. Discussed medications. Gave prescriptions. Gave work note. Patient will be D/C home in stable condition.

## 2016-08-20 NOTE — Discharge Summary (Signed)
Physician Discharge Summary  ARISBEL MAIONE ELT:[id number] DOB: 07-19-1960 DOA: 08/15/2016  PCP: No PCP Per Patient  Admit date: 08/15/2016 Discharge date: 08/20/2016  Admitted From: home Disposition:  home  Recommendations for Outpatient Follow-up:  1. Follow up with PCP in 1-2 weeks as arranges in [facility] 2. Follow up with surgery as scheduled  Discharge Condition: stable CODE STATUS: Full code Diet recommendation: regular  HPI: Per Dr. Loleta Books, CHARMAGNE BUHL is a 56 y.o. female with no significant past medical history who presents with acute epigastric pain. The patient was completely in her normal state of health until tonight around 11 PM, as she was driving to work, she developed sudden onset severe epigastric pain "all on the top of my abdomen" along with nausea. She took some Maalox without relief, vomited once, and came to the emergency room.  She does not use alcohol to excess.  She has had no jaundice, weightloss.  She has never had gallstones before.  Hospital Course: Discharge Diagnoses:  Principal Problem:   Pancreatitis Active Problems:   Liver mass, right lobe   S/P laparoscopic cholecystectomy   Tobacco abuse   AF (paroxysmal atrial fibrillation) (HCC)  Acute gallstone pancreatitis -patient was admitted to the hospital with acute pancreatitis thought to be due to gallstones.  General surgery was consulted.  Patient underwent an MRI/MRCP which showed multiple large gallstones within the gallbladder.  She was eventually taken to the operating room on 4/3 for laparoscopic cholecystectomy.  From surgical standpoint she improved, her pain was well-controlled, she her diet was advanced and she was able to tolerate a regular diet without nausea or vomiting.  She will follow-up with general surgery in couple weeks as an outpatient. New onset A. fib with RVR, resolved -Patient was noted postop developed A. fib with RVR, requiring Cardizem infusion and  transfer to SDU.  Cardiology was consulted by the surgical team at that time.  Patient was on diltiazem infusion, and converted back to sinus rhythm within a few hours after the surgery.  She underwent a 2D echo which showed normal EF of 56-86%, normal diastolic function.  Given very transient A. fib and that she maintain sinus rhythm until discharge, cardiology did not recommend anticoagulation.  To be further monitored as an outpatient.  ? Liver mass -Liver MRI/MRCP report noted , it suggest a 7.6 CM Rt Liver Mass presumed to be a Hemagioma Tobacco abuse -Discussed with patient, she would like to try to quit and would like some nicotine patches on discharge which were prescribed  Discharge Instructions   Allergies as of 08/20/2016   No Known Allergies     Medication List    TAKE these medications   nicotine 21 mg/24hr patch Commonly known as:  NICODERM CQ - dosed in mg/24 hours Place 1 patch (21 mg total) onto the skin daily.   oxyCODONE 5 MG immediate release tablet Commonly known as:  Oxy IR/ROXICODONE Take 1 tablet (5 mg total) by mouth every 4 (four) hours as needed for severe pain.      Follow-up [facility], [place]. Go on 09/01/2016.   Specialty:  General Surgery Why:  at 3:00 PM. please arrive 30 minutes early.  Contact information: [facility] [place] Follow up on 09/02/2016.   Specialty:  Internal Medicine Why:  appointment at 10:00 AM please take with you  discharge papers, and ID Contact information: [place] [phone]         No Known Allergies  Consultations:  Cardiology   General surgery  Procedures/Studies:  2D echo Study Conclusions - Left ventricle: The cavity size was normal. Systolic function was normal. The estimated ejection fraction was in the range of 60% to 65%. Wall motion  was normal; there were no regional wall motion abnormalities. Left ventricular diastolic function parameters were normal. - Left atrium: The atrium was normal in size. - Tricuspid valve: There was trivial regurgitation.  Dg Cholangiogram Operative  Result Date: 08/18/2016 CLINICAL DATA:  56 year old female with a history of cholelithiasis EXAM: INTRAOPERATIVE CHOLANGIOGRAM TECHNIQUE: Cholangiographic images from the C-arm fluoroscopic device were submitted for interpretation post-operatively. Please see the procedural report for the amount of contrast and the fluoroscopy time utilized. COMPARISON:  MR 08/15/2016 FINDINGS: Surgical instruments project over the upper abdomen. There is cannulation of the cystic duct/gallbladder neck, with antegrade infusion of contrast. Caliber of the extrahepatic ductal system within normal limits. No large filling defect identified. Contrast does not cross the ampulla, with meniscus at the distal common bile duct. IMPRESSION: Intraoperative cholangiogram demonstrates extrahepatic biliary ducts of unremarkable caliber, with no large filling defect identified. Contrast is not observed to cross the ampulla on the current study, with meniscus at the distal common bile duct. Choledocholithiasis not excluded. Please refer to the dictated operative report for full details of intraoperative findings and procedure Electronically Signed   By: Corrie Mckusick D.O.   On: 08/18/2016 16:49   Mr 3d Recon At Scanner  Result Date: 08/15/2016 CLINICAL DATA:  4.6 cm echogenic mass in the liver on a recent limited abdomen ultrasound. EXAM: MRI ABDOMEN WITHOUT AND WITH CONTRAST (INCLUDING MRCP) TECHNIQUE: Multiplanar multisequence MR imaging of the abdomen was performed both before and after the administration of intravenous contrast. Heavily T2-weighted images of the biliary and pancreatic ducts were obtained, and three-dimensional MRCP images were rendered by post processing. CONTRAST:  85m  MULTIHANCE GADOBENATE DIMEGLUMINE 529 MG/ML IV SOLN COMPARISON:  Limited abdomen ultrasound dated 08/15/2016. FINDINGS: Lower chest: Minimal right basilar atelectasis. Hepatobiliary: Diffuse low signal intensity of the liver relative to the spleen on fat suppression images. Triangular-shaped mass in the right lobe of the liver measuring 7.6 x 6.8 x 6.5 cm in maximum dimensions. This has homogeneous increased signal intensity on T2 and weighted images and homogeneously decreased signal intensity on precontrast T1 weighted images. This demonstrates peripheral contrast puddling on the postcontrast images. This corresponds to the mass seen at ultrasound. Multiple large stones are filling the gallbladder. The largest stone measures 2.3 cm in maximum diameter. No gallbladder wall thickening or pericholecystic fluid. Pancreas:  Normal.  No mass demonstrated. Spleen:  Normal in size, shape and signal intensity. Adrenals/Urinary Tract: Normal appearing adrenal glands, kidneys and included portions of the ureters. Stomach/Bowel: The stomach and included portions of the small bowel and colon appear normal. Vascular/Lymphatic: Normal appearing aorta and its branches. No enlarged lymph nodes. Other:  None. Musculoskeletal: Unremarkable. IMPRESSION: 1. 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 2. Diffuse hepatic steatosis. 3. Multiple large gallstones filling the gallbladder without evidence of cholecystitis. The largest stone measures 2.3 cm. Electronically Signed   By: SClaudie ReveringM.D.   On: 08/15/2016 08:46   Mr Abdomen Mrcp WMoise BoringContast  Result Date: 08/15/2016 CLINICAL DATA:  4.6 cm echogenic mass in the liver on a recent limited abdomen ultrasound. EXAM: MRI ABDOMEN  WITHOUT AND WITH CONTRAST (INCLUDING MRCP) TECHNIQUE: Multiplanar multisequence MR imaging of the abdomen was performed both before and after the administration of intravenous contrast. Heavily T2-weighted images of the biliary and pancreatic ducts were obtained,  and three-dimensional MRCP images were rendered by post processing. CONTRAST:  7m MULTIHANCE GADOBENATE DIMEGLUMINE 529 MG/ML IV SOLN COMPARISON:  Limited abdomen ultrasound dated 08/15/2016. FINDINGS: Lower chest: Minimal right basilar atelectasis. Hepatobiliary: Diffuse low signal intensity of the liver relative to the spleen on fat suppression images. Triangular-shaped mass in the right lobe of the liver measuring 7.6 x 6.8 x 6.5 cm in maximum dimensions. This has homogeneous increased signal intensity on T2 and weighted images and homogeneously decreased signal intensity on precontrast T1 weighted images. This demonstrates peripheral contrast puddling on the postcontrast images. This corresponds to the mass seen at ultrasound. Multiple large stones are filling the gallbladder. The largest stone measures 2.3 cm in maximum diameter. No gallbladder wall thickening or pericholecystic fluid. Pancreas:  Normal.  No mass demonstrated. Spleen:  Normal in size, shape and signal intensity. Adrenals/Urinary Tract: Normal appearing adrenal glands, kidneys and included portions of the ureters. Stomach/Bowel: The stomach and included portions of the small bowel and colon appear normal. Vascular/Lymphatic: Normal appearing aorta and its branches. No enlarged lymph nodes. Other:  None. Musculoskeletal: Unremarkable. IMPRESSION: 1. 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 2. Diffuse hepatic steatosis. 3. Multiple large gallstones filling the gallbladder without evidence of cholecystitis. The largest stone measures 2.3 cm. Electronically Signed   By: SClaudie ReveringM.D.   On: 08/15/2016 08:46   [place] Limited Ruq  Result Date: 08/15/2016 CLINICAL DATA:  Acute onset of right upper quadrant abdominal pain and vomiting. Initial encounter. EXAM: [place] LIMITED - RIGHT UPPER QUADRANT COMPARISON:  None. FINDINGS: Gallbladder: The gallbladder is filled with stones, with a wall echo shadow sign. No definite gallbladder wall  thickening or pericholecystic fluid is seen. No ultrasonographic Murphy's sign is elicited. Common bile duct: Diameter: 0.3 cm, within normal limits in caliber. Liver: A large echogenic lesion is noted at the right hepatic lobe, measuring 4.6 x 4.2 x 3.8 cm. Though this could reflect an hemangioma or adenoma, malignancy cannot be entirely excluded. Otherwise within normal limits in parenchymal echogenicity. IMPRESSION: 1. Gallbladder filled with stones, with a wall echo shadow sign. No evidence for obstruction or cholecystitis. 2. Large 4.6 cm echogenic lesion at the right hepatic lobe. Though this could reflect an hemangioma or adenoma, malignancy cannot be entirely excluded. Dynamic liver protocol MRI or CT would be helpful for further evaluation. Electronically Signed   By: JGarald BaldingM.D.   On: 08/15/2016 02:37     Subjective: - no chest pain, shortness of breath, no abdominal pain, nausea or vomiting.   Discharge Exam: Vitals:   08/19/16 2207 08/20/16 0447  BP: 104/72 109/66  Pulse: (!) 52 (!) 51  Resp: 18 18  Temp: 98.3 F (36.8 C) 98.4 F (36.9 C)   Vitals:   08/19/16 1700 08/19/16 1751 08/19/16 2207 08/20/16 0447  BP:  118/75 104/72 109/66  Pulse: (!) 51 (!) 53 (!) 52 (!) 51  Resp: 12 16 18 18   Temp:  98.1 F (36.7 C) 98.3 F (36.8 C) 98.4 F (36.9 C)  TempSrc:  Oral Oral Oral  SpO2: 96% 96% 97% 96%  Weight:      Height:        General: Pt is alert, awake, not in acute distress Cardiovascular: RRR, S1/S2 +, no rubs, no gallops  Respiratory: CTA bilaterally, no wheezing, no rhonchi Abdominal: Soft, NT, ND, bowel sounds + Extremities: no edema, no cyanosis    The results of significant diagnostics from this hospitalization (including imaging, microbiology, ancillary and laboratory) are listed below for reference.     Microbiology: Recent Results (from the past 240 hour(s))  MRSA PCR Screening     Status: None   Collection Time: 08/15/16  6:40 AM  Result Value  Ref Range Status   MRSA by PCR NEGATIVE NEGATIVE Final    Comment:        The GeneXpert MRSA Assay (FDA approved for NASAL specimens only), is one component of a comprehensive MRSA colonization surveillance program. It is not intended to diagnose MRSA infection nor to guide or monitor treatment for MRSA infections.   MRSA PCR Screening     Status: None   Collection Time: 08/18/16  6:08 PM  Result Value Ref Range Status   MRSA by PCR NEGATIVE NEGATIVE Final    Comment:        The GeneXpert MRSA Assay (FDA approved for NASAL specimens only), is one component of a comprehensive MRSA colonization surveillance program. It is not intended to diagnose MRSA infection nor to guide or monitor treatment for MRSA infections.      Labs: BNP (last 3 results) No results for input(s): BNP in the last 8760 hours. Basic Metabolic Panel:  Recent Labs Lab 08/15/16 0101 08/16/16 0523 08/18/16 1830 08/19/16 0345 08/19/16 1433 08/20/16 0452  NA 139 137  --  136 138 138  K 3.9 4.8  --  4.3 4.6 4.1  CL 109 108  --  104 105 104  CO2 23 26  --  26 27 27   GLUCOSE 115* 85  --  160* 105* 89  BUN 15 10  --  6 9 15   CREATININE 0.78 0.77 0.68 0.78 0.71 0.78  CALCIUM 9.2 8.4*  --  9.0 9.4 9.0   Liver Function Tests:  Recent Labs Lab 08/15/16 0101 08/16/16 0523 08/19/16 0345 08/19/16 1433 08/20/16 0452  AST 134* 161* 111* 55* 35  ALT 71* 256* 136* 111* 87*  ALKPHOS 139* 144* 150* 141* 121  BILITOT 0.9 0.8 0.7 0.4 0.6  PROT 7.0 6.3* 6.9 6.8 6.4*  ALBUMIN 3.9 3.4* 3.8 3.8 3.6    Recent Labs Lab 08/15/16 0307 08/16/16 0523  LIPASE 1,012* 66*   No results for input(s): AMMONIA in the last 168 hours. CBC:  Recent Labs Lab 08/15/16 0101 08/16/16 0523 08/18/16 1830 08/19/16 0345 08/20/16 0452  WBC 10.1 6.6 10.6* 9.4 7.7  NEUTROABS 8.7*  --   --   --   --   HGB 14.5 12.3 13.6 13.1 11.8*  HCT 44.9 38.4 41.6 39.7 36.8  MCV 88.9 90.8 84.9 86.9 89.3  PLT 192 173 200 199 190    Cardiac Enzymes:  Recent Labs Lab 08/15/16 0101  TROPONINI <0.03   BNP: Invalid input(s): POCBNP CBG: No results for input(s): GLUCAP in the last 168 hours. D-Dimer No results for input(s): DDIMER in the last 72 hours. Hgb A1c No results for input(s): HGBA1C in the last 72 hours. Lipid Profile No results for input(s): CHOL, HDL, LDLCALC, TRIG, CHOLHDL, LDLDIRECT in the last 72 hours. Thyroid function studies No results for input(s): TSH, T4TOTAL, T3FREE, THYROIDAB in the last 72 hours.  Invalid input(s): FREET3 Anemia work up No results for input(s): VITAMINB12, FOLATE, FERRITIN, TIBC, IRON, RETICCTPCT in the last 72 hours. Urinalysis    Component Value Date/Time  COLORURINE YELLOW 08/15/2016 0040   APPEARANCEUR CLEAR 08/15/2016 0040   LABSPEC 1.021 08/15/2016 0040   PHURINE 5.0 08/15/2016 0040   GLUCOSEU NEGATIVE 08/15/2016 0040   HGBUR SMALL (A) 08/15/2016 0040   BILIRUBINUR NEGATIVE 08/15/2016 0040   KETONESUR NEGATIVE 08/15/2016 0040   PROTEINUR NEGATIVE 08/15/2016 0040   NITRITE NEGATIVE 08/15/2016 0040   LEUKOCYTESUR NEGATIVE 08/15/2016 0040   Sepsis Labs Invalid input(s): PROCALCITONIN,  WBC,  LACTICIDVEN Microbiology Recent Results (from the past 240 hour(s))  MRSA PCR Screening     Status: None   Collection Time: 08/15/16  6:40 AM  Result Value Ref Range Status   MRSA by PCR NEGATIVE NEGATIVE Final    Comment:        The GeneXpert MRSA Assay (FDA approved for NASAL specimens only), is one component of a comprehensive MRSA colonization surveillance program. It is not intended to diagnose MRSA infection nor to guide or monitor treatment for MRSA infections.   MRSA PCR Screening     Status: None   Collection Time: 08/18/16  6:08 PM  Result Value Ref Range Status   MRSA by PCR NEGATIVE NEGATIVE Final    Comment:        The GeneXpert MRSA Assay (FDA approved for NASAL specimens only), is one component of a comprehensive MRSA  colonization surveillance program. It is not intended to diagnose MRSA infection nor to guide or monitor treatment for MRSA infections.      Time coordinating discharge: 40 minutes  SIGNED:  Marzetta Board, MD  [facility] 08/20/2016, 12:17 PM Pager [phone]  If 7PM-7AM, please contact night-coverage [email or web address] Password [email or web address]

## 2016-08-20 NOTE — Progress Notes (Signed)
Appointment at [place]. 09/02/16 at 10:00 AM.

## 2016-08-20 NOTE — Discharge Instructions (Signed)
YOUR FOLLOW UP APPOINTMENT IS AT 09/01/16 at 3:00 PM.  please arrive at least 30 min before your appointment to complete your check in paperwork.  If you are unable to arrive 30 min prior to your appointment time we may have to cancel or reschedule you.  LAPAROSCOPIC SURGERY: POST OP INSTRUCTIONS  1. DIET: Follow a light bland diet the first 24 hours after arrival home, such as soup, liquids, crackers, etc. Be sure to include lots of fluids daily. Avoid fast food or heavy meals as your are more likely to get nauseated. Eat a low fat the next few days after surgery.  2. Take your usually prescribed home medications unless otherwise directed. 3. PAIN CONTROL:  1. Pain is best controlled by a usual combination of three different methods TOGETHER:  1. Ice/Heat 2. Over the counter pain medication 3. Prescription pain medication 2. Most patients will experience some swelling and bruising around the incisions. Ice packs or heating pads (30-60 minutes up to 6 times a day) will help. Use ice for the first few days to help decrease swelling and bruising, then switch to heat to help relax tight/sore spots and speed recovery. Some people prefer to use ice alone, heat alone, alternating between ice & heat. Experiment to what works for you. Swelling and bruising can take several weeks to resolve.  3. It is helpful to take an over-the-counter pain medication regularly for the first few weeks. Choose one of the following that works best for you:  1. Naproxen (Aleve, etc) Two 212m tabs twice a day 2. Ibuprofen (Advil, etc) Three 2075mtabs four times a day (every meal & bedtime) 3. Acetaminophen (Tylenol, etc) 500-65039mour times a day (every meal & bedtime) 4. A prescription for pain medication (such as oxycodone, hydrocodone, etc) should be given to you upon discharge. Take your pain medication as prescribed.  1. If you are having problems/concerns with the prescription medicine (does not control pain, nausea,  vomiting, rash, itching, etc), please call us [phone] see if we need to switch you to a different pain medicine that will work better for you and/or control your side effect better. 2. If you need a refill on your pain medication, please contact your pharmacy. They will contact our office to request authorization. Prescriptions will not be filled after 5 pm or on week-ends. 4. Avoid getting constipated. Between the surgery and the pain medications, it is common to experience some constipation. Increasing fluid intake and taking a fiber supplement (such as Metamucil, Citrucel, FiberCon, MiraLax, etc) 1-2 times a day regularly will usually help prevent this problem from occurring. A mild laxative (prune juice, Milk of Magnesia, MiraLax, etc) should be taken according to package directions if there are no bowel movements after 48 hours.  5. Watch out for diarrhea. If you have many loose bowel movements, simplify your diet to bland foods & liquids for a few days. Stop any stool softeners and decrease your fiber supplement. Switching to mild anti-diarrheal medications (Kayopectate, Pepto Bismol) can help. If this worsens or does not improve, please call us.[email or web address]. Wash / shower every day. You may shower over the dressings as they are waterproof. Continue to shower over incision(s) after the dressing is off. 7. Remove your waterproof bandages 5 days after surgery. You may leave the incision open to air. You may replace a dressing/Band-Aid to cover the incision for comfort if you wish.  8. ACTIVITIES as tolerated:  1. You may resume regular (light) daily  activities beginning the next day--such as daily self-care, walking, climbing stairs--gradually increasing activities as tolerated. If you can walk 30 minutes without difficulty, it is safe to try more intense activity such as jogging, treadmill, bicycling, low-impact aerobics, swimming, etc. 2. Save the most intensive and strenuous activity for last such as  sit-ups, heavy lifting, contact sports, etc Refrain from any heavy lifting or straining until you are off narcotics for pain control.  3. DO NOT PUSH THROUGH PAIN. Let pain be your guide: If it hurts to do something, don't do it. Pain is your body warning you to avoid that activity for another week until the pain goes down. 4. You may drive when you are no longer taking prescription pain medication, you can comfortably wear a seatbelt, and you can safely maneuver your car and apply brakes. 5. You may have sexual intercourse when it is comfortable.  9. FOLLOW UP in our office  1. Please call [facility] at [phone] to set up an appointment to see your surgeon in the office for a follow-up appointment approximately 2-3 weeks after your surgery. 2. Make sure that you call for this appointment the day you arrive home to insure a convenient appointment time.      10. IF YOU HAVE DISABILITY OR FAMILY LEAVE FORMS, BRING THEM TO THE               OFFICE FOR PROCESSING.   WHEN TO CALL us [phone]:  1. Poor pain control 2. Reactions / problems with new medications (rash/itching, nausea, etc)  3. Fever over 101.5 F (38.5 C) 4. Inability to urinate 5. Nausea and/or vomiting 6. Worsening swelling or bruising 7. Continued bleeding from incision. 8. Increased pain, redness, or drainage from the incision  The clinic staff is available to answer your questions during regular business hours (8:30am-5pm). Please dont hesitate to call and ask to speak to one of our nurses for clinical concerns.  If you have a medical emergency, go to the nearest emergency room or call 911.  A surgeon from [facility] is always on call at the [facility], [place], [place], [place], [place] [address] ?  MAIN: [phone] ? TOLL FREE: [phone] ?  FAX [phone]  [email or web address]

## 2016-09-02 ENCOUNTER — Ambulatory Visit: Payer: Self-pay | Admitting: Family Medicine

## 2016-09-09 ENCOUNTER — Ambulatory Visit (INDEPENDENT_AMBULATORY_CARE_PROVIDER_SITE_OTHER): Payer: Self-pay | Admitting: Family Medicine

## 2016-09-09 ENCOUNTER — Encounter: Payer: Self-pay | Admitting: Family Medicine

## 2016-09-09 VITALS — BP 146/90 | HR 72 | Temp 98.3°F | Resp 16 | Ht 68.0 in | Wt 232.0 lb

## 2016-09-09 DIAGNOSIS — Z1239 Encounter for other screening for malignant neoplasm of breast: Secondary | ICD-10-CM

## 2016-09-09 DIAGNOSIS — Z0000 Encounter for general adult medical examination without abnormal findings: Secondary | ICD-10-CM

## 2016-09-09 DIAGNOSIS — K851 Biliary acute pancreatitis without necrosis or infection: Secondary | ICD-10-CM

## 2016-09-09 DIAGNOSIS — Z1231 Encounter for screening mammogram for malignant neoplasm of breast: Secondary | ICD-10-CM

## 2016-09-09 DIAGNOSIS — I480 Paroxysmal atrial fibrillation: Secondary | ICD-10-CM

## 2016-09-09 DIAGNOSIS — Z1321 Encounter for screening for nutritional disorder: Secondary | ICD-10-CM

## 2016-09-09 DIAGNOSIS — D1803 Hemangioma of intra-abdominal structures: Secondary | ICD-10-CM

## 2016-09-09 LAB — CBC WITH DIFFERENTIAL/PLATELET
BASOS ABS: 0 {cells}/uL (ref 0–200)
BASOS PCT: 0 %
EOS ABS: 284 {cells}/uL (ref 15–500)
Eosinophils Relative: 4 %
HEMATOCRIT: 41.3 % (ref 35.0–45.0)
HEMOGLOBIN: 13.4 g/dL (ref 11.7–15.5)
LYMPHS ABS: 1988 {cells}/uL (ref 850–3900)
Lymphocytes Relative: 28 %
MCH: 28.4 pg (ref 27.0–33.0)
MCHC: 32.4 g/dL (ref 32.0–36.0)
MCV: 87.5 fL (ref 80.0–100.0)
MPV: 9.5 fL (ref 7.5–12.5)
Monocytes Absolute: 497 cells/uL (ref 200–950)
Monocytes Relative: 7 %
Neutro Abs: 4331 cells/uL (ref 1500–7800)
Neutrophils Relative %: 61 %
Platelets: 299 10*3/uL (ref 140–400)
RBC: 4.72 MIL/uL (ref 3.80–5.10)
RDW: 14.5 % (ref 11.0–15.0)
WBC: 7.1 10*3/uL (ref 3.8–10.8)

## 2016-09-09 LAB — THYROID PANEL WITH TSH
Free Thyroxine Index: 1.7 (ref 1.4–3.8)
T3 Uptake: 26 % (ref 22–35)
T4, Total: 6.4 ug/dL (ref 4.5–12.0)
TSH: 4.4 mIU/L

## 2016-09-09 LAB — COMPLETE METABOLIC PANEL WITH GFR
AG Ratio: 1.7 Ratio (ref 1.0–2.5)
ALBUMIN: 4 g/dL (ref 3.6–5.1)
ALT: 19 U/L (ref 6–29)
AST: 16 U/L (ref 10–35)
Alkaline Phosphatase: 118 U/L (ref 33–130)
BUN / CREAT RATIO: 17 ratio (ref 6–22)
BUN: 15 mg/dL (ref 7–25)
CALCIUM: 9.5 mg/dL (ref 8.6–10.4)
CHLORIDE: 108 mmol/L (ref 98–110)
CO2: 22 mmol/L (ref 20–31)
CREATININE: 0.88 mg/dL (ref 0.50–1.05)
GFR, Est African American: 86 mL/min (ref >=60)
GFR, Est Non African American: 74 mL/min (ref >=60)
GLUCOSE: 91 mg/dL (ref 65–99)
Globulin: 2.4 g/dL (ref 1.9–3.7)
Potassium: 4.5 mmol/L (ref 3.5–5.3)
Sodium: 141 mmol/L (ref 135–146)
TOTAL PROTEIN: 6.4 g/dL (ref 6.1–8.1)
Total Bilirubin: 0.3 mg/dL (ref 0.2–1.2)

## 2016-09-09 LAB — LIPASE: Lipase: 47 U/L (ref 7–60)

## 2016-09-09 LAB — LIPID PANEL
CHOL/HDL RATIO: 4.6 ratio (ref <5.0)
Cholesterol: 196 mg/dL (ref <200)
HDL: 43 mg/dL — AB (ref >50)
LDL CALC: 97 mg/dL (ref <100)
TRIGLYCERIDES: 278 mg/dL — AB (ref <150)
VLDL: 56 mg/dL — AB (ref <30)

## 2016-09-09 LAB — AMYLASE: Amylase: 52 U/L (ref 0–105)

## 2016-09-09 LAB — POCT URINALYSIS DIP (DEVICE)
BILIRUBIN URINE: NEGATIVE
GLUCOSE, UA: NEGATIVE mg/dL
Hgb urine dipstick: NEGATIVE
Ketones, ur: NEGATIVE mg/dL
LEUKOCYTES UA: NEGATIVE
Nitrite: NEGATIVE
Protein, ur: NEGATIVE mg/dL
SPECIFIC GRAVITY, URINE: 1.015 (ref 1.005–1.030)
UROBILINOGEN UA: 0.2 mg/dL (ref 0.0–1.0)
pH: 8.5 — ABNORMAL HIGH (ref 5.0–8.0)

## 2016-09-09 NOTE — Progress Notes (Signed)
Patient ID: Tammy Davies, female    DOB: 10-04-1960, 56 y.o.   MRN: [id number]  PCP: Tammy Barrows, FNP  Chief Complaint  Patient presents with  . Establish Care    Subjective:  HPI Tammy Davies is a 56 y.o. female presents to establish care. Only significant medical history includes: tobacco use,  recent dx pancreatitis resulting from cholecystitis, s/p  laparoscopic cholecystomy, right liver lobe hemangioma, paroxysmal atrial fibrillation post surgery. Tammy Davies reports prior to recent hospitalization for pancreatitis 08/15/2016, she has not been medically evaluated in more than 20 years. Reports a significant family history of cardiovascular disease. Mother, father, brother, all have atrial fibrillation. Her son has a congenital electrical problem with his heart in which requires him to wear a pacemaker. Her father and sister (20 y.o) both died of massive heart attacks. Family history very significant for atrial fibrillation, mother, brother, son, father She donated plasma for years and noticed toward the later part of last year her blood pressure has been increasing. No hx of hypertension to her knowledge. No chest pain, dizziness, headaches, or chest pain.  She has hx of a partial hysterectomy due to a large cysts 30 years ago.  Her mother has been diagnosed with breast cancer twice. She hasn't had a prior mammogram and last PAP was about 20 years ago.  Social History   Social History  . Marital status: Single    Spouse name: N/A  . Number of children: N/A  . Years of education: N/A   Occupational History  . Not on file.   Social History Main Topics  . Smoking status: Current Every Day Smoker    Types: Cigarettes  . Smokeless tobacco: Never Used  . Alcohol use No  . Drug use: Yes    Types: Marijuana  . Sexual activity: Not on file   Other Topics Concern  . Not on file   Social History Narrative  . No narrative on file    Family History  Problem  Relation Age of Onset  . Heart failure Mother   . Breast cancer Mother   . Heart failure Father   . Diabetes Father     Review of Systems See HPI  Patient Active Problem List   Diagnosis Date Noted  . S/P laparoscopic cholecystectomy 08/20/2016  . Tobacco abuse 08/20/2016  . AF (paroxysmal atrial fibrillation) ([place]) 08/20/2016  . Pancreatitis 08/15/2016  . Liver mass, 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    No Known Allergies  Prior to Admission medications   Medication Sig Start Date End Date Taking? Authorizing Provider  nicotine (NICODERM CQ - DOSED IN MG/24 HOURS) 21 mg/24hr patch Place 1 patch (21 mg total) onto the skin daily. Patient not taking: Reported on 09/09/2016 08/20/16   Tammy Griffins, MD  oxyCODONE (OXY IR/ROXICODONE) 5 MG immediate release tablet Take 1 tablet (5 mg total) by mouth every 4 (four) hours as needed for severe pain. Patient not taking: Reported on 09/09/2016 08/20/16   Tammy Griffins, MD    Past Medical, Surgical Family and Social History reviewed and updated.    Objective:   Today's Vitals   09/09/16 1035  BP: (!) 146/90  Pulse: 72  Resp: 16  Temp: 98.3 F (36.8 C)  TempSrc: Oral  SpO2: 97%  Weight: 232 lb (105.2 kg)  Height: 5\' 8"  (1.727 m)    Wt Readings from Last 3 Encounters:  09/09/16 232 lb (105.2 kg)  08/15/16 235 lb (106.6 kg)  03/21/12 240 lb (  108.9 kg)   Physical Exam  Constitutional: She is oriented to person, place, and time. She appears well-developed and well-nourished.  HENT:  Head: Normocephalic and atraumatic.  Eyes: Conjunctivae are normal. Pupils are equal, round, and reactive to light.  Neck: Normal range of motion. Neck supple.  Cardiovascular: Normal rate, regular rhythm, normal heart sounds and intact distal pulses.   EKG -Sinus Rhythm-negative of ischemia   Pulmonary/Chest: Effort normal and breath sounds normal.  Abdominal: Soft. Bowel sounds are normal.  Lap sites completely healed   Musculoskeletal: Normal range  of motion.  Neurological: She is alert and oriented to person, place, and time.  Skin: Skin is warm and dry.  Psychiatric: She has a normal mood and affect. Her behavior is normal. Judgment and thought content normal.     Assessment & Plan:  1. Screening for breast cancer - MM Digital Screening; Future  2. Encounter for vitamin deficiency screening - VITAMIN D 25 Hydroxy (Vit-D Deficiency, Fractures)  3. Paroxysmal A-fib (HCC) - EKG 12-Lead  4. Acute biliary pancreatitis, unspecified complication status - Amylase - Lipase  5. Encounter for wellness examination - COMPLETE METABOLIC PANEL WITH GFR - CBC with Differential - Lipid panel - Thyroid Panel With TSH  6. Hemangioma of liver  Plan: RTC: 6 months for PAP -Once financial assistance is in place, I will refer you for your colonoscopy screening. -Liver mass verified my MRI to be 7.6 cm hemangioma. Will continue to monitor liver enzymes for any    abnormalities. If abnormal enzymes occur, will refer to GI for further evaluation. -Discussed signs and symptoms associated with dysrhythmias.     Tammy Sage. Kenton Kingfisher, MSN, [facility] [address] [place], [place] [address] [phone]

## 2016-09-09 NOTE — Patient Instructions (Signed)
Exercising to Stay Healthy Exercising regularly is important. It has many health benefits, such as:  Improving your overall fitness, flexibility, and endurance.  Increasing your bone density.  Helping with weight control.  Decreasing your body fat.  Increasing your muscle strength.  Reducing stress and tension.  Improving your overall health. In order to become healthy and stay healthy, it is recommended that you do moderate-intensity and vigorous-intensity exercise. You can tell that you are exercising at a moderate intensity if you have a higher heart rate and faster breathing, but you are still able to hold a conversation. You can tell that you are exercising at a vigorous intensity if you are breathing much harder and faster and cannot hold a conversation while exercising. How often should I exercise? Choose an activity that you enjoy and set realistic goals. Your health care provider can help you to make an activity plan that works for you. Exercise regularly as directed by your health care provider. This may include:  Doing resistance training twice each week, such as:  Push-ups.  Sit-ups.  Lifting weights.  Using resistance bands.  Doing a given intensity of exercise for a given amount of time. Choose from these options:  150 minutes of moderate-intensity exercise every week.  75 minutes of vigorous-intensity exercise every week.  A mix of moderate-intensity and vigorous-intensity exercise every week. Children, pregnant women, people who are out of shape, people who are overweight, and older adults may need to consult a health care provider for individual recommendations. If you have any sort of medical condition, be sure to consult your health care provider before starting a new exercise program. What are some exercise ideas? Some moderate-intensity exercise ideas include:  Walking at a rate of 1 mile in 15 minutes.  Biking.  Hiking.  Golfing.  Dancing. Some  vigorous-intensity exercise ideas include:  Walking at a rate of at least 4.5 miles per hour.  Jogging or running at a rate of 5 miles per hour.  Biking at a rate of at least 10 miles per hour.  Lap swimming.  Roller-skating or in-line skating.  Cross-country skiing.  Vigorous competitive sports, such as football, basketball, and soccer.  Jumping rope.  Aerobic dancing. What are some everyday activities that can help me to get exercise?  Yard work, such as:  Psychologist, educational.  Raking and bagging leaves.  Washing and waxing your car.  Pushing a stroller.  Shoveling snow.  Gardening.  Washing windows or floors. How can I be more active in my day-to-day activities?  Use the stairs instead of the elevator.  Take a walk during your lunch break.  If you drive, park your car farther away from work or school.  If you take public transportation, get off one stop early and walk the rest of the way.  Make all of your phone calls while standing up and walking around.  Get up, stretch, and walk around every 30 minutes throughout the day. What guidelines should I follow while exercising?  Do not exercise so much that you hurt yourself, feel dizzy, or get very short of breath.  Consult your health care provider before starting a new exercise program.  Wear comfortable clothes and shoes with good support.  Drink plenty of water while you exercise to prevent dehydration or heat stroke. Body water is lost during exercise and must be replaced.  Work out until you breathe faster and your heart beats faster. This information is not intended to replace advice  given to you by your health care provider. Make sure you discuss any questions you have with your health care provider. Document Released: 06/06/2010 Document Revised: 10/10/2015 Document Reviewed: 10/05/2013 Elsevier Interactive Patient Education  2017 [employer].

## 2016-09-10 LAB — VITAMIN D 25 HYDROXY (VIT D DEFICIENCY, FRACTURES): VIT D 25 HYDROXY: 10 ng/mL — AB (ref 30–100)

## 2016-09-12 MED ORDER — VITAMIN D (ERGOCALCIFEROL) 1.25 MG (50000 UNIT) PO CAPS: 50000.0000 [IU] | ORAL_CAPSULE | ORAL | 0 refills | Status: DC

## 2016-09-12 NOTE — Addendum Note (Signed)
Addended by: Scot Jun on: 09/12/2016 04:59 PM   Modules accepted: Orders

## 2016-10-17 NOTE — Addendum Note (Signed)
Addendum  created 10/17/16 0809 by Duane Boston, MD   Sign clinical note

## 2017-03-11 ENCOUNTER — Ambulatory Visit: Payer: Self-pay | Admitting: Family Medicine

## 2017-06-08 ENCOUNTER — Emergency Department (HOSPITAL_COMMUNITY): Payer: Self-pay

## 2017-06-08 ENCOUNTER — Other Ambulatory Visit: Payer: Self-pay

## 2017-06-08 ENCOUNTER — Observation Stay (HOSPITAL_COMMUNITY)
Admission: EM | Admit: 2017-06-08 | Discharge: 2017-06-09 | Disposition: A | Discharge status: Home / Self Care | Payer: Self-pay | Attending: Internal Medicine | Admitting: Internal Medicine

## 2017-06-08 ENCOUNTER — Encounter (HOSPITAL_COMMUNITY): Payer: Self-pay | Admitting: Emergency Medicine

## 2017-06-08 DIAGNOSIS — I10 Essential (primary) hypertension: Secondary | ICD-10-CM | POA: Insufficient documentation

## 2017-06-08 DIAGNOSIS — Z720 Tobacco use: Secondary | ICD-10-CM | POA: Diagnosis present

## 2017-06-08 DIAGNOSIS — R079 Chest pain, unspecified: Secondary | ICD-10-CM

## 2017-06-08 DIAGNOSIS — I480 Paroxysmal atrial fibrillation: Secondary | ICD-10-CM | POA: Diagnosis present

## 2017-06-08 DIAGNOSIS — I259 Chronic ischemic heart disease, unspecified: Secondary | ICD-10-CM

## 2017-06-08 DIAGNOSIS — R739 Hyperglycemia, unspecified: Secondary | ICD-10-CM

## 2017-06-08 DIAGNOSIS — R7309 Other abnormal glucose: Secondary | ICD-10-CM | POA: Insufficient documentation

## 2017-06-08 DIAGNOSIS — I4891 Unspecified atrial fibrillation: Principal | ICD-10-CM | POA: Diagnosis present

## 2017-06-08 HISTORY — DX: Tobacco use: Z72.0

## 2017-06-08 HISTORY — DX: Paroxysmal atrial fibrillation: I48.0

## 2017-06-08 HISTORY — DX: Essential (primary) hypertension: I10

## 2017-06-08 HISTORY — DX: Cardiac arrhythmia, unspecified: I49.9

## 2017-06-08 LAB — LIPID PANEL
CHOL/HDL RATIO: 4.3 ratio
Cholesterol: 173 mg/dL (ref 0–200)
HDL: 40 mg/dL — AB (ref >40)
LDL Cholesterol: 110 mg/dL — ABNORMAL HIGH (ref 0–99)
Triglycerides: 116 mg/dL (ref <150)
VLDL: 23 mg/dL (ref 0–40)

## 2017-06-08 LAB — CBC
HCT: 46.5 % — ABNORMAL HIGH (ref 36.0–46.0)
Hemoglobin: 15.2 g/dL — ABNORMAL HIGH (ref 12.0–15.0)
MCH: 29.2 pg (ref 26.0–34.0)
MCHC: 32.7 g/dL (ref 30.0–36.0)
MCV: 89.4 fL (ref 78.0–100.0)
PLATELETS: 220 10*3/uL (ref 150–400)
RBC: 5.2 MIL/uL — AB (ref 3.87–5.11)
RDW: 14.4 % (ref 11.5–15.5)
WBC: 8.2 10*3/uL (ref 4.0–10.5)

## 2017-06-08 LAB — T4, FREE: Free T4: 0.63 ng/dL (ref 0.61–1.12)

## 2017-06-08 LAB — TSH: TSH: 5.452 u[IU]/mL — AB (ref 0.350–4.500)

## 2017-06-08 LAB — TROPONIN I
Troponin I: <0.03 ng/mL (ref <0.03)
Troponin I: <0.03 ng/mL (ref <0.03)

## 2017-06-08 LAB — I-STAT BETA HCG BLOOD, ED (MC, WL, AP ONLY)

## 2017-06-08 LAB — BASIC METABOLIC PANEL
Anion gap: 10 (ref 5–15)
BUN: 14 mg/dL (ref 6–20)
CHLORIDE: 107 mmol/L (ref 101–111)
CO2: 23 mmol/L (ref 22–32)
CREATININE: 1 mg/dL (ref 0.44–1.00)
Calcium: 9.6 mg/dL (ref 8.9–10.3)
Glucose, Bld: 134 mg/dL — ABNORMAL HIGH (ref 65–99)
Potassium: 3.6 mmol/L (ref 3.5–5.1)
SODIUM: 140 mmol/L (ref 135–145)

## 2017-06-08 LAB — CBG MONITORING, ED: GLUCOSE-CAPILLARY: 86 mg/dL (ref 65–99)

## 2017-06-08 LAB — HEMOGLOBIN A1C
Hgb A1c MFr Bld: 5.5 % (ref 4.8–5.6)
Mean Plasma Glucose: 111.15 mg/dL

## 2017-06-08 LAB — MAGNESIUM: MAGNESIUM: 1.9 mg/dL (ref 1.7–2.4)

## 2017-06-08 MED ORDER — ACETAMINOPHEN 650 MG RE SUPP
650.0000 mg | Freq: Four times a day (QID) | RECTAL | Status: DC | PRN
Start: 2017-06-08 — End: 2017-06-09

## 2017-06-08 MED ORDER — DILTIAZEM HCL-DEXTROSE 100-5 MG/100ML-% IV SOLN (PREMIX)
5.0000 mg/h | Freq: Once | INTRAVENOUS | Status: AC
  Administered 2017-06-08: 5 mg/h via INTRAVENOUS
  Filled 2017-06-08: qty 100

## 2017-06-08 MED ORDER — BISACODYL 10 MG RE SUPP: 10.0000 mg | Freq: Every day | RECTAL | Status: DC | PRN

## 2017-06-08 MED ORDER — ONDANSETRON HCL 4 MG PO TABS: 4.0000 mg | ORAL_TABLET | Freq: Four times a day (QID) | ORAL | Status: DC | PRN

## 2017-06-08 MED ORDER — HYDROCODONE-ACETAMINOPHEN 5-325 MG PO TABS: 1.0000 | ORAL_TABLET | ORAL | Status: DC | PRN

## 2017-06-08 MED ORDER — NICOTINE 7 MG/24HR TD PT24
7.0000 mg | MEDICATED_PATCH | Freq: Every day | TRANSDERMAL | Status: DC
  Administered 2017-06-08 – 2017-06-09 (×2): 7 mg via TRANSDERMAL
  Filled 2017-06-08 (×2): qty 1

## 2017-06-08 MED ORDER — DILTIAZEM HCL 25 MG/5ML IV SOLN
10.0000 mg | Freq: Once | INTRAVENOUS | Status: AC
  Administered 2017-06-08: 10 mg via INTRAVENOUS
  Filled 2017-06-08: qty 5

## 2017-06-08 MED ORDER — DILTIAZEM HCL ER COATED BEADS 120 MG PO CP24
120.0000 mg | ORAL_CAPSULE | Freq: Every day | ORAL | Status: DC
  Administered 2017-06-08 – 2017-06-09 (×2): 120 mg via ORAL
  Filled 2017-06-08 (×2): qty 1

## 2017-06-08 MED ORDER — ONDANSETRON HCL 4 MG/2ML IJ SOLN
4.0000 mg | Freq: Four times a day (QID) | INTRAMUSCULAR | Status: DC | PRN
Start: 2017-06-08 — End: 2017-06-09

## 2017-06-08 MED ORDER — APIXABAN 5 MG PO TABS
5.0000 mg | ORAL_TABLET | Freq: Two times a day (BID) | ORAL | Status: DC
  Administered 2017-06-08 – 2017-06-09 (×2): 5 mg via ORAL
  Filled 2017-06-08 (×2): qty 1

## 2017-06-08 MED ORDER — DILTIAZEM HCL 30 MG PO TABS
30.0000 mg | ORAL_TABLET | Freq: Once | ORAL | Status: AC
  Administered 2017-06-08: 30 mg via ORAL
  Filled 2017-06-08: qty 1

## 2017-06-08 MED ORDER — ACETAMINOPHEN 325 MG PO TABS
650.0000 mg | ORAL_TABLET | Freq: Four times a day (QID) | ORAL | Status: DC | PRN
Start: 2017-06-08 — End: 2017-06-09

## 2017-06-08 MED ORDER — SODIUM CHLORIDE 0.9 % IV BOLUS (SEPSIS)
1000.0000 mL | Freq: Once | INTRAVENOUS | Status: AC
Start: 2017-06-08 — End: 2017-06-08
  Administered 2017-06-08: 1000 mL via INTRAVENOUS

## 2017-06-08 MED ORDER — SENNOSIDES-DOCUSATE SODIUM 8.6-50 MG PO TABS: 1.0000 | ORAL_TABLET | Freq: Every evening | ORAL | Status: DC | PRN

## 2017-06-08 MED ORDER — NICOTINE POLACRILEX 2 MG MT GUM
2.0000 mg | CHEWING_GUM | OROMUCOSAL | Status: DC | PRN
  Administered 2017-06-08: 2 mg via ORAL
  Filled 2017-06-08: qty 1

## 2017-06-08 NOTE — Consult Note (Addendum)
The patient has been seen in conjunction with [name], [facility]. All aspects of care have been considered and discussed. The patient has been personally interviewed, examined, and all clinical data has been reviewed.   Paroxysmal atrial fibrillation, new diagnosis. CHADS VASC = 3 (female, elevated blood sugar, hypertension).  Recommend indefinite anticoagulation therapy with a DOAC  Start diltiazem to better control rate when she has atrial fibrillation.  48-hour Holter monitor to assess for burden of A. Fib.  2D Doppler echocardiogram should be done to assess structural integrity of the heart.  High likelihood that sleep apnea is a trigger for the patient's atrial fibrillation.  Will need to have an outpatient study performed.  Will need chronic follow-up with cardiology.   Cardiology Consultation:   Patient ID: Tammy Davies; [id number]; [birth year]   Admit date: 06/08/2017 Date of Consult: 06/08/2017  Primary Care Provider: Scot Jun, FNP Primary Cardiologist: Tamala Julian- New  Patient Profile:   Tammy Davies is a 57 y.o. female with a hx of transient intra-operative atrial fibrillation with conversion to NSR, gallstone pancreatitis s/p cholecystectomy, and tobacco abuse with otherwise no other significant medical or cardiac history who is being seen today for the evaluation of atrial fibrillation at the request of Dr. Dina Rich.  History of Present Illness:   Ms. Tammy Davies is a pleasant 57 yo F who presented to the ED for palpitations. She admits that she has been intermittently having these episodes for that last several months, however they are typically brief and transient in duration and resolve on their own. Last night she was at work when she noticed once again that she was having palpitations, but this time the episode lasted for approximately 1.5hours. She continued to work during this time. She took her BP which was elevated at 180/100 and her HR was around 160,  and she proceeded to the ED.   n the ED on 06/08/17, she presented with Afib RVR with a HR in the 140-160 range. She was started on IV diltiazem. Before she was transferred to her hospital room, she converted to NSR. She states that she feels much better now that her HR is better controlled. In speaking further with the pat, she endorses a mother, father, and brother who have Afib. She also reports symptoms similar to sleep apnea, including snoring, fatigue, and trouble staying asleep. She reports no chest pain, shortness of breath or LE swelling.   She was initially dx with PAF post-operatively in 2018 s/p cholecystectomy. She was started in IV Cardizem during that time and converted to NSR on her own. She remained in NSR the entire duration of her hospital stay. Cardiology was consulted and opted to not anticoagulate her due to the transient nature of her episode. She followed with her PCP post discharge where and EKG was performed. She remained in NSR.    Past Medical History:  Diagnosis Date  . Dysrhythmia   . PAF (paroxysmal atrial fibrillation) ([place])   . Tobacco abuse     Past Surgical History:  Procedure Laterality Date  . ABDOMINAL HYSTERECTOMY    . CHOLECYSTECTOMY N/A 08/18/2016   Procedure: LAPAROSCOPIC CHOLECYSTECTOMY WITH  INTRAOPERATIVE CHOLANGIOGRAM;  Surgeon: Johnathan Hausen, MD;  Location: [facility] ORS;  Service: General;  Laterality: N/A;     Prior to Admission medications   Not on File    Inpatient Medications: Scheduled Meds:  Continuous Infusions:  PRN Meds: acetaminophen **OR** acetaminophen, bisacodyl, HYDROcodone-acetaminophen, nicotine polacrilex, ondansetron **OR** ondansetron (ZOFRAN) IV, senna-docusate  Allergies:   No Known Allergies  Social History:   Social History   Socioeconomic History  . Marital status: Single    Spouse name: Not on file  . Number of children: Not on file  . Years of education: Not on file  . Highest education level: Not on file    Social Needs  . Financial resource strain: Not on file  . Food insecurity - worry: Not on file  . Food insecurity - inability: Not on file  . Transportation needs - medical: Not on file  . Transportation needs - non-medical: Not on file  Occupational History  . Not on file  Tobacco Use  . Smoking status: Current Every Day Smoker    Types: Cigarettes  . Smokeless tobacco: Never Used  Substance and Sexual Activity  . Alcohol use: No  . Drug use: Yes    Types: Marijuana  . Sexual activity: No  Other Topics Concern  . Not on file  Social History Narrative  . Not on file    Family History:   Family History  Problem Relation Age of Onset  . Heart failure Mother   . Breast cancer Mother   . Heart failure Father   . Diabetes Father   . Microcephaly Father    Family Status:  Family Status  Relation Name Status  . Mother  Deceased  . Father  Deceased    ROS:  Please see the history of present illness.  All other ROS reviewed and negative.     Physical Exam/Data:   Vitals:   06/08/17 1100 06/08/17 1130 06/08/17 1235 06/08/17 1306  BP: 111/69 (!) 107/58 (!) 102/50 123/72  Pulse: 66 61 81 67  Resp: 17 18 18    Temp:    98.1 F (36.7 C)  TempSrc:    Oral  SpO2: 95% 95% 95% 97%  Weight:    249 lb 9.6 oz (113.2 kg)  Height:    5' 8"  (1.727 m)    Intake/Output Summary (Last 24 hours) at 06/08/2017 1334 Last data filed at 06/08/2017 0332 Gross per 24 hour  Intake 1000 ml  Output -  Net 1000 ml   Filed Weights   06/08/17 1306  Weight: 249 lb 9.6 oz (113.2 kg)   Body mass index is 37.95 kg/m.   General: Well developed, well nourished, NAD Skin: Warm, dry, intact  Head: Normocephalic, atraumatic, clear, moist mucus membranes. Neck: Negative for carotid bruits. No JVD Lungs:Clear to ausculation bilaterally. No wheezes, rales, or rhonchi. Breathing is unlabored. Cardiovascular: RRR with S1 S2. No murmurs, rubs, or gallops Abdomen: Soft, non-tender, non-distended  with normoactive bowel sounds. No obvious abdominal masses. MSK: Strength and tone appear normal for age. 5/5 in all extremities Extremities: No edema. No clubbing or cyanosis. DP/PT pulses 2+ bilaterally Neuro: Alert and oriented. No focal deficits. No facial asymmetry. MAE spontaneously. Psych: Responds to questions appropriately with normal affect.    EKG:  The EKG was personally reviewed and demonstrates: 06/07/17 Atrial fibrillation HR 131 Telemetry:  Telemetry was personally reviewed and demonstrates:  06/08/17 NSR HR 64  Relevant CV Studies:  ECHO:  08/19/16 Study Conclusions  - Left ventricle: The cavity size was normal. Systolic function was   normal. The estimated ejection fraction was in the range of 60%   to 65%. Wall motion was normal; there were no regional wall   motion abnormalities. Left ventricular diastolic function   parameters were normal. - Left atrium: The atrium was normal in size. -  Tricuspid valve: There was trivial regurgitation.  Echo 06/08/16: Pending   CATH: NA  Laboratory Data:  Chemistry Recent Labs  Lab 06/08/17 0213  NA 140  K 3.6  CL 107  CO2 23  GLUCOSE 134*  BUN 14  CREATININE 1.00  CALCIUM 9.6  GFRNONAA >60  GFRAA >60  ANIONGAP 10    Total Protein  Date Value Ref Range Status  09/09/2016 6.4 6.1 - 8.1 g/dL Final   Albumin  Date Value Ref Range Status  09/09/2016 4.0 3.6 - 5.1 g/dL Final   AST  Date Value Ref Range Status  09/09/2016 16 10 - 35 U/L Final   ALT  Date Value Ref Range Status  09/09/2016 19 6 - 29 U/L Final   Alkaline Phosphatase  Date Value Ref Range Status  09/09/2016 118 33 - 130 U/L Final   Total Bilirubin  Date Value Ref Range Status  09/09/2016 0.3 0.2 - 1.2 mg/dL Final   Hematology Recent Labs  Lab 06/08/17 0213  WBC 8.2  RBC 5.20*  HGB 15.2*  HCT 46.5*  MCV 89.4  MCH 29.2  MCHC 32.7  RDW 14.4  PLT 220   Cardiac Enzymes Recent Labs  Lab 06/08/17 0213 06/08/17 0819  06/08/17 1002  TROPONINI <0.03 <0.03 <0.03   No results for input(s): TROPIPOC in the last 168 hours.  BNPNo results for input(s): BNP, PROBNP in the last 168 hours.  DDimer No results for input(s): DDIMER in the last 168 hours. TSH:  Lab Results  Component Value Date   TSH 5.452 (H) 06/08/2017   Lipids: Lab Results  Component Value Date   CHOL 173 06/08/2017   HDL 40 (L) 06/08/2017   LDLCALC 110 (H) 06/08/2017   TRIG 116 06/08/2017   CHOLHDL 4.3 06/08/2017   HgbA1c: Lab Results  Component Value Date   HGBA1C 5.5 06/08/2017    Radiology/Studies:  Dg Chest Port 1 View  Result Date: 06/08/2017 CLINICAL DATA:  Sudden onset palpitations. EXAM: PORTABLE CHEST 1 VIEW COMPARISON:  03/31/2015 FINDINGS: Normal heart size and pulmonary vascularity. No focal airspace disease or consolidation in the lungs. No blunting of costophrenic angles. No pneumothorax. Mediastinal contours appear intact. Degenerative changes in the shoulders. IMPRESSION: No active disease. Electronically Signed   By: Lucienne Capers M.D.   On: 06/08/2017 02:32    Assessment and Plan:   1.Atrial fibrillation with conversion to NSR: -Now in NSR, was on IV diltiazem in the ED>>off now -Due to the recurrence of her Afib with RVR within the last year, will add  low dose PO cardizem and to start on Eliquis -Last echocardiogram 08/19/16, EF 60-65% -Echo this admission, pending -CHA2DS2VASc = 2 -Will schedule an outpatient sleep study for further evaluation -Will plan on a 48H cardiac monitor at discharge to evaluate the frequency of her episodes.  -Will need to monitor her BP given that it is on the softer side, 102/50>111/92>107/58  2. Tobacco abuse: -Encouraged smoking cessation -Pt admits to being ready and willing to consider quitting -Will add nicotine patch to help with this   For questions or updates, please contact [place] Please consult [email or web address] for contact info under Cardiology/STEMI.    SignedKathyrn Drown NP-C [facility] Pager: [phone] 06/08/2017 1:34 PM

## 2017-06-08 NOTE — ED Triage Notes (Signed)
Pt reports sudden onset palpitations while working, states started while she was working and would subside when resting. AFIB 140-160s with RVR.

## 2017-06-08 NOTE — ED Notes (Signed)
Patient requested nicotine patch. Nurse explained doctor ordered nicotine gum. Stated never tried but would. Notified pharmacy to send to [facility] via tube station. Family member arrived at bedside.

## 2017-06-08 NOTE — H&P (Signed)
History and Physical    Tammy Davies XNA:[id number] DOB: 1961-02-14 DOA: 06/08/2017   PCP: Scot Jun, FNP   Patient coming from:  Home    Chief Complaint: Palpitations  HPI: Tammy Davies is avery pleasant 57 y.o. female with medical history significant for paroxysmal atrial fibrillation with RVR in April 2018 postoperatively treated with Cardizem, resenting with palpitations while at work, then at midnight, for which she sought medical attention.  She also reported associated heaviness in her chest but not discrete pain, or radiation. She denies any shortness of breath or cough at this time, but last URI was in Nov 2018 and since then, her palpitations were more pronounced, and more frequent.  No other recent surgeries.  She is not on aspirin or any other anticoagulants.  She has never been seen by cardiology as outpatient, only in the hospital (Dr. Marlou Porch) at the time, consider unclear if the patient did have paroxysmal atrial fibrillation, versus just PVCs or PACs.  At the time, was recommended that the patient should try Cardizem 60 mg short acting as needed for palpitations and to follow-up with PCP, which she failed to do.  She denies a history of coronary artery disease, hypertension, hyperlipidemia or diabetes.  She reports drinking significant amount of caffeine in the evening.  She also has a history of tobacco abuse, and moderate obesity. Other decreased factor includes positive family history of cardiac disease, with both parents with a history of A. fib, pacemaker, and father having MI.   ED Course:  BP (!) 111/92   Pulse 73   Temp 98.7 F (37.1 C) (Oral)   Resp 13   SpO2 96%  CHADSVASC2 score is 2 Glucose 134, creatinine 1, white count 8.2, hemoglobin 15.2, platelets 220. Initial EKG showed A. fib with RVR, right bundle branch block, left anterior fascicular block, and new RBBB and Q waves anteriorly when compared to prior EKG Received Cardizem 10 mg x1, and  then was placed on drip for better control, and with good response He also received 1 L of IV fluids normal saline  Review of Systems:  As per HPI otherwise all other systems reviewed and are negative  Past Medical History:  Diagnosis Date  . Dysrhythmia   . PAF (paroxysmal atrial fibrillation) ([place])   . Tobacco abuse     Past Surgical History:  Procedure Laterality Date  . ABDOMINAL HYSTERECTOMY    . CHOLECYSTECTOMY N/A 08/18/2016   Procedure: LAPAROSCOPIC CHOLECYSTECTOMY WITH  INTRAOPERATIVE CHOLANGIOGRAM;  Surgeon: Johnathan Hausen, MD;  Location: [facility];  Service: General;  Laterality: N/A;    Social History Social History   Socioeconomic History  . Marital status: Single    Spouse name: Not on file  . Number of children: Not on file  . Years of education: Not on file  . Highest education level: Not on file  Social Needs  . Financial resource strain: Not on file  . Food insecurity - worry: Not on file  . Food insecurity - inability: Not on file  . Transportation needs - medical: Not on file  . Transportation needs - non-medical: Not on file  Occupational History  . Not on file  Tobacco Use  . Smoking status: Current Every Day Smoker    Types: Cigarettes  . Smokeless tobacco: Never Used  Substance and Sexual Activity  . Alcohol use: No  . Drug use: Yes    Types: Marijuana  . Sexual activity: No  Other Topics Concern  .  Not on file  Social History Narrative  . Not on file     No Known Allergies  Family History  Problem Relation Age of Onset  . Heart failure Mother   . Breast cancer Mother   . Heart failure Father   . Diabetes Father   . Microcephaly Father       Prior to Admission medications   Not on File    Physical Exam:  Vitals:   06/08/17 0515 06/08/17 0530 06/08/17 0545 06/08/17 0615  BP: (!) 126/96 107/72 107/76 (!) 111/92  Pulse: (!) 124 (!) 121 (!) 117 73  Resp: 17 17 18 13   Temp:      TempSrc:      SpO2: 94% 94% 96% 96%    Constitutional: NAD, calm, comfortable at rest, only developing palpitations when ambulating Eyes: PERRL, lids and conjunctivae normal ENMT: Mucous membranes are moist, without exudate or lesions. Poor dentition with several missing teeth Neck: normal, supple, no masses, no thyromegaly Respiratory: clear to auscultation bilaterally, no wheezing, no crackles. Normal respiratory effort  Cardiovascular:Irregularly irregular rate and rhythm,  murmur, rubs or gallops. No extremity edema. 2+ pedal pulses. No carotid bruits.  Abdomen: Soft, non tender, No hepatosplenomegaly. Bowel sounds positive.  Musculoskeletal: no clubbing / cyanosis. Moves all extremities Skin: no jaundice, No lesions.  Neurologic: Sensation intact  Strength equal in all extremities Psychiatric:   Alert and oriented x 3. Normal mood.     Labs on Admission: I have personally reviewed following labs and imaging studies  CBC: Recent Labs  Lab 06/08/17 0213  WBC 8.2  HGB 15.2*  HCT 46.5*  MCV 89.4  PLT 196    Basic Metabolic Panel: Recent Labs  Lab 06/08/17 0213 06/08/17 0247  NA 140  --   K 3.6  --   CL 107  --   CO2 23  --   GLUCOSE 134*  --   BUN 14  --   CREATININE 1.00  --   CALCIUM 9.6  --   MG  --  1.9    GFR: CrCl cannot be calculated (Unknown ideal weight.).  Liver Function Tests: No results for input(s): AST, ALT, ALKPHOS, BILITOT, PROT, ALBUMIN in the last 168 hours. No results for input(s): LIPASE, AMYLASE in the last 168 hours. No results for input(s): AMMONIA in the last 168 hours.  Coagulation Profile: No results for input(s): INR, PROTIME in the last 168 hours.  Cardiac Enzymes: Recent Labs  Lab 06/08/17 0213  TROPONINI <0.03    BNP (last 3 results) No results for input(s): PROBNP in the last 8760 hours.  HbA1C: No results for input(s): HGBA1C in the last 72 hours.  CBG: No results for input(s): GLUCAP in the last 168 hours.  Lipid Profile: No results for input(s):  CHOL, HDL, LDLCALC, TRIG, CHOLHDL, LDLDIRECT in the last 72 hours.  Thyroid Function Tests: No results for input(s): TSH, T4TOTAL, FREET4, T3FREE, THYROIDAB in the last 72 hours.  Anemia Panel: No results for input(s): VITAMINB12, FOLATE, FERRITIN, TIBC, IRON, RETICCTPCT in the last 72 hours.  Urine analysis:    Component Value Date/Time   COLORURINE YELLOW 08/15/2016 0040   APPEARANCEUR CLEAR 08/15/2016 0040   LABSPEC 1.015 09/09/2016 1206   PHURINE 8.5 (H) 09/09/2016 1206   GLUCOSEU NEGATIVE 09/09/2016 1206   HGBUR NEGATIVE 09/09/2016 1206   BILIRUBINUR NEGATIVE 09/09/2016 1206   KETONESUR NEGATIVE 09/09/2016 1206   PROTEINUR NEGATIVE 09/09/2016 1206   UROBILINOGEN 0.2 09/09/2016 1206   NITRITE NEGATIVE  09/09/2016 1206   LEUKOCYTESUR NEGATIVE 09/09/2016 1206    Sepsis Labs: @LABRCNTIP (procalcitonin:4,lacticidven:4) )No results found for this or any previous visit (from the past 240 hour(s)).   Radiological Exams on Admission: Dg Chest Port 1 View  Result Date: 06/08/2017 CLINICAL DATA:  Sudden onset palpitations. EXAM: PORTABLE CHEST 1 VIEW COMPARISON:  03/31/2015 FINDINGS: Normal heart size and pulmonary vascularity. No focal airspace disease or consolidation in the lungs. No blunting of costophrenic angles. No pneumothorax. Mediastinal contours appear intact. Degenerative changes in the shoulders. IMPRESSION: No active disease. Electronically Signed   By: Lucienne Capers M.D.   On: 06/08/2017 02:32    EKG: Independently reviewed.  Assessment/Plan Principal Problem:   Atrial fibrillation with RVR (HCC) Active Problems:   Tobacco abuse   Elevated glucose   Hypertension   Atrial Fibrillation with RVR, CHADSVASC 2  Initial EKG showed A. fib with RVR, right bundle branch block, left anterior fascicular block, and new RBBB and Q waves anteriorly when compared to prior EKG. Received Cardizem 10 mg x1, and then was placed on Cardizem  drip for better control, and with good  response.troponin less than 0.03 x 1 CXR NAD . Last 2 D echo in April 2018 showed EF 60-65%, normal wall motion, normal left ventricular diastolic function, and no regional wall abnormality.  Normal left atrium, trivial regurgitation of the tricuspid valve.  She was not on any meds prior to admission.  She was not on aspirin or anticoagulant risk factors include obesity, tobacco abuse, prior history of PAF, and strong family history of cardiac disease, unknown if the patient has a history of hyperlipidemia  Admit to stepdown observation Atrial Fibrillation order set Continue Cardizem drip for now 2 D echo Cycle Tn, EKG  Check TSH, lipid panel Cardiology to consult.  We will keep n.p.o., and on SCDs until patient is seen by cardiology, in case that cardiac catheterization is warranted.  Elevated Glucose Patient noted to have elevated Glucose in labs at 134 fasting. No prior documented history of DM Check A1C Check CBG bid    Hypertension, no prior documented history  BP  111/92   Pulse 73   Will defer to Cards on recommendations. Pulse currently controlled with Cardizem, but will consider BB    Tobacco abuse, patient reports smoking much less than prior, about 3 or 4 cigarettes a day, she was offered nicotine patch, but patient prefers gum as needed  Nicotine gum as needed Counseled cessation     DVT prophylaxis SCDs  code Status:    Full  Family Communication:  Discussed with patient Disposition Plan: Expect patient to be discharged to home after condition improves Consults called:    Cardiology  Admission status:  Obs Stepdown    Sharene Butters, PA-C [facility]   Amion text  [phone]   06/08/2017, 7:37 AM

## 2017-06-08 NOTE — ED Notes (Signed)
Spoke with patient continues to have no chest pain or shortness of breath.  States she is hungry. Spoke with admitting provider who will order patient able to eat and change bed assignment from Step Down to Telemetry. Gave [place] sandwich to patient.

## 2017-06-08 NOTE — Plan of Care (Signed)
Pt up ad lib. Ambulates independently with ease. Educated on use of call light system. Verbalizes understanding of need to call for assistance prior to ambulation when necessary. 

## 2017-06-08 NOTE — ED Provider Notes (Signed)
[facility] EMERGENCY DEPARTMENT Provider Note   CSN: [id number] Arrival date & time: 06/08/17  0204     History   Chief Complaint Chief Complaint  Patient presents with  . Atrial Fibrillation    HPI Tammy Davies is a 57 y.o. female.  HPI  This is a 57 year old female who presents with palpitations.  Patient reports that she was at work and at midnight she noted onset of palpitations.  She reports associated heaviness in the chest.  Denies radiation of any pain or heaviness.  Nothing seems to make it better or worse.  Denies any chest pain or shortness of breath.  In triage she was noted to be in atrial fibrillation with RVR.  She states that her only history of atrial fibrillation was during surgery.  This self resolved with diltiazem.  She does not take anticoagulation.  She has not seen cardiology.  She denies any history of coronary artery disease, hypertension, hyperlipidemia, diabetes.  She does report a strong family history.  Patient does report that she drank a new coffee this evening and is unsure of the caffeine content.  However, she does drink coffee daily.  History reviewed. No pertinent past medical history.  Patient Active Problem List   Diagnosis Date Noted  . S/P laparoscopic cholecystectomy 08/20/2016  . Tobacco abuse 08/20/2016  . AF (paroxysmal atrial fibrillation) ([place]) 08/20/2016  . Pancreatitis 08/15/2016  . Liver mass, 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    Past Surgical History:  Procedure Laterality Date  . ABDOMINAL HYSTERECTOMY    . CHOLECYSTECTOMY N/A 08/18/2016   Procedure: LAPAROSCOPIC CHOLECYSTECTOMY WITH  INTRAOPERATIVE CHOLANGIOGRAM;  Surgeon: Johnathan Hausen, MD;  Location: [facility];  Service: General;  Laterality: N/A;    OB History    No data available       Home Medications    Prior to Admission medications   Medication Sig Start Date End Date Taking? Authorizing Provider  nicotine (NICODERM CQ - DOSED IN MG/24 HOURS) 21  mg/24hr patch Place 1 patch (21 mg total) onto the skin daily. Patient not taking: Reported on 09/09/2016 08/20/16   Caren Griffins, MD  oxyCODONE (OXY IR/ROXICODONE) 5 MG immediate release tablet Take 1 tablet (5 mg total) by mouth every 4 (four) hours as needed for severe pain. Patient not taking: Reported on 09/09/2016 08/20/16   Caren Griffins, MD  Vitamin D, Ergocalciferol, (DRISDOL) 50000 units CAPS capsule Take 1 capsule (50,000 Units total) by mouth every 7 (seven) days. 09/12/16   Scot Jun, FNP    Family History Family History  Problem Relation Age of Onset  . Heart failure Mother   . Breast cancer Mother   . Heart failure Father   . Diabetes Father     Social History Social History   Tobacco Use  . Smoking status: Current Every Day Smoker    Types: Cigarettes  . Smokeless tobacco: Never Used  Substance Use Topics  . Alcohol use: No  . Drug use: Yes    Types: Marijuana     Allergies   Patient has no known allergies.   Review of Systems Review of Systems  Constitutional: Negative for fever.  Respiratory: Negative for shortness of breath.   Cardiovascular: Positive for palpitations. Negative for chest pain and leg swelling.  Gastrointestinal: Negative for abdominal pain, nausea and vomiting.  Neurological: Positive for dizziness.  All other systems reviewed and are negative.    Physical Exam Updated Vital Signs BP 106/90   Pulse  86   Temp 98.7 F (37.1 C) (Oral)   Resp (!) 21   SpO2 94%   Physical Exam  Constitutional: She is oriented to person, place, and time.  Overweight, no acute distress  HENT:  Head: Normocephalic and atraumatic.  Poor dentition  Eyes: Pupils are equal, round, and reactive to light.  Cardiovascular: Normal heart sounds.  Irregular irregular rhythm, tachycardia  Pulmonary/Chest: Effort normal. No respiratory distress. She has no wheezes.  Abdominal: Soft. Bowel sounds are normal. There is no tenderness.    Musculoskeletal: She exhibits no edema.  Neurological: She is alert and oriented to person, place, and time.  Skin: Skin is warm and dry.  Psychiatric: She has a normal mood and affect.  Nursing note and vitals reviewed.    ED Treatments / Results  Labs (all labs ordered are listed, but only abnormal results are displayed) Labs Reviewed  BASIC METABOLIC PANEL - Abnormal; Notable for the following components:      Result Value   Glucose, Bld 134 (*)    All other components within normal limits  CBC - Abnormal; Notable for the following components:   RBC 5.20 (*)    Hemoglobin 15.2 (*)    HCT 46.5 (*)    All other components within normal limits  MAGNESIUM  TROPONIN I  I-STAT BETA HCG BLOOD, ED ([facility], [facility], [facility] ONLY)    EKG  EKG Interpretation  Date/Time:  Tuesday June 08 2017 02:10:13 EST Ventricular Rate:  131 PR Interval:    QRS Duration: 114 QT Interval:  328 QTC Calculation: 484 R Axis:   -68 Text Interpretation:  Atrial fibrillation with rapid ventricular response Right bundle branch block Left anterior fascicular block  Bifascicular block  Minimal voltage criteria for LVH, may be normal variant Septal infarct , age undetermined Abnormal ECG RBBB new and and Q waves anteriorly new when compared to prior; may be rate related Confirmed by Thayer Jew ([id number]) on 06/08/2017 2:17:09 AM       Radiology Dg Chest Port 1 View  Result Date: 06/08/2017 CLINICAL DATA:  Sudden onset palpitations. EXAM: PORTABLE CHEST 1 VIEW COMPARISON:  03/31/2015 FINDINGS: Normal heart size and pulmonary vascularity. No focal airspace disease or consolidation in the lungs. No blunting of costophrenic angles. No pneumothorax. Mediastinal contours appear intact. Degenerative changes in the shoulders. IMPRESSION: No active disease. Electronically Signed   By: Lucienne Capers M.D.   On: 06/08/2017 02:32    Procedures Procedures (including critical care time)  CRITICAL CARE Performed by:  Merryl Hacker   Total critical care time: 35 minutes  Critical care time was exclusive of separately billable procedures and treating other patients.  Critical care was necessary to treat or prevent imminent or life-threatening deterioration.  Critical care was time spent personally by me on the following activities: development of treatment plan with patient and/or surrogate as well as nursing, discussions with consultants, evaluation of patient's response to treatment, examination of patient, obtaining history from patient or surrogate, ordering and performing treatments and interventions, ordering and review of laboratory studies, ordering and review of radiographic studies, pulse oximetry and re-evaluation of patient's condition.   Medications Ordered in ED Medications  diltiazem (CARDIZEM) 100 mg in dextrose 5% 191m (1 mg/mL) infusion (not administered)  diltiazem (CARDIZEM) injection 10 mg (10 mg Intravenous Given 06/08/17 0301)  sodium chloride 0.9 % bolus 1,000 mL (1,000 mLs Intravenous New Bag/Given 06/08/17 0302)     Initial Impression / Assessment and Plan / ED Course  I have reviewed the triage vital signs and the nursing notes.  Pertinent labs & imaging results that were available during my care of the patient were reviewed by me and considered in my medical decision making (see chart for details).  Clinical Course as of Jun 08 421  Tue Jun 08, 2017  0420 On recheck following bolus, heart rate in the 110s-120s.  Will start infusion.  Lab workup is largely reassuring.  Patient is resting comfortably.  Plan for admission for further workup and management.  [CH]    Clinical Course User Index [CH] Horton, Barbette Hair, MD   This patients CHA2DS2-VASc Score and unadjusted Ischemic Stroke Rate (% per year) is equal to 0.6 % stroke rate/year from a score of 1  Above score calculated as 1 point each if present [CHF, HTN, DM, Vascular=MI/PAD/Aortic Plaque, Age if 65-74, or  Female] Above score calculated as 2 points each if present [Age > 75, or Stroke/TIA/TE]  Patient presents with palpitations and chest heaviness.  She is in A. fib with RVR.  Only history during surgery.  She has a chads vas 2 score of 1.  Other risk factors include being a smoker.  She has never seen a cardiologist.  Patient was given double bolus and workup was initiated.  See clinical course above.  On multiple rechecks, heart rate is in the 110s-120s.  Do not infusion was initiated.  We will continue to monitor as blood pressure is 161 systolic.  Feel patient will need admission for further management, echocardiogram, and possible cardiology evaluation.  Final Clinical Impressions(s) / ED Diagnoses   Final diagnoses:  Chest pain  Atrial fibrillation with RVR [facility])    ED Discharge Orders        Ordered    Amb referral to AFIB Clinic     06/08/17 0239       Merryl Hacker, MD 06/08/17 [phone]

## 2017-06-09 ENCOUNTER — Other Ambulatory Visit: Payer: Self-pay | Admitting: Physician Assistant

## 2017-06-09 ENCOUNTER — Telehealth: Payer: Self-pay | Admitting: *Deleted

## 2017-06-09 ENCOUNTER — Telehealth: Payer: Self-pay | Admitting: Interventional Cardiology

## 2017-06-09 ENCOUNTER — Observation Stay (HOSPITAL_BASED_OUTPATIENT_CLINIC_OR_DEPARTMENT_OTHER): Payer: Self-pay

## 2017-06-09 DIAGNOSIS — I4891 Unspecified atrial fibrillation: Secondary | ICD-10-CM

## 2017-06-09 DIAGNOSIS — I10 Essential (primary) hypertension: Secondary | ICD-10-CM

## 2017-06-09 LAB — CBC
HCT: 45 % (ref 36.0–46.0)
Hemoglobin: 14.4 g/dL (ref 12.0–15.0)
MCH: 28.9 pg (ref 26.0–34.0)
MCHC: 32 g/dL (ref 30.0–36.0)
MCV: 90.4 fL (ref 78.0–100.0)
Platelets: 223 10*3/uL (ref 150–400)
RBC: 4.98 MIL/uL (ref 3.87–5.11)
RDW: 14.7 % (ref 11.5–15.5)
WBC: 6.2 10*3/uL (ref 4.0–10.5)

## 2017-06-09 LAB — BASIC METABOLIC PANEL
Anion gap: 7 (ref 5–15)
BUN: 12 mg/dL (ref 6–20)
CALCIUM: 9.1 mg/dL (ref 8.9–10.3)
CHLORIDE: 105 mmol/L (ref 101–111)
CO2: 27 mmol/L (ref 22–32)
CREATININE: 0.8 mg/dL (ref 0.44–1.00)
GFR calc non Af Amer: >60 mL/min (ref >60)
GLUCOSE: 88 mg/dL (ref 65–99)
Potassium: 4.3 mmol/L (ref 3.5–5.1)
Sodium: 139 mmol/L (ref 135–145)

## 2017-06-09 LAB — PROTIME-INR
INR: 1.03
Prothrombin Time: 13.4 seconds (ref 11.4–15.2)

## 2017-06-09 LAB — ECHOCARDIOGRAM COMPLETE
HEIGHTINCHES: 68 in
Weight: 3955.2 oz

## 2017-06-09 MED ORDER — NICOTINE 7 MG/24HR TD PT24: 7.0000 mg | MEDICATED_PATCH | Freq: Every day | TRANSDERMAL | 0 refills | Status: DC

## 2017-06-09 MED ORDER — NICOTINE POLACRILEX 2 MG MT GUM: 2.0000 mg | CHEWING_GUM | OROMUCOSAL | 0 refills | Status: DC | PRN

## 2017-06-09 MED ORDER — DILTIAZEM HCL ER COATED BEADS 120 MG PO CP24: 120.0000 mg | ORAL_CAPSULE | Freq: Every day | ORAL | 0 refills | Status: DC

## 2017-06-09 MED ORDER — APIXABAN 5 MG PO TABS: 5.0000 mg | ORAL_TABLET | Freq: Two times a day (BID) | ORAL | 0 refills | Status: DC

## 2017-06-09 NOTE — Care Management Note (Addendum)
Case Management Note  Patient Details  Name: Tammy Davies MRN: [id number] Date of Birth: 1960-10-28  Subjective/Objective:  Pt presented for Atrial Fib. Plan for d/c on Eliquis. Pt's insurance cards faxed to admitting.                    Action/Plan: Benefits Check in Process for Eliquis and CM will make pt aware of cost once completed.   Expected Discharge Date:                  Expected Discharge Plan:  Home/Self Care  In-House Referral:  NA  Discharge planning Services  CM Consult, Medication Assistance  Post Acute Care Choice:  NA Choice offered to:  NA  DME Arranged:  N/A DME Agency:  NA  HH Arranged:  NA HH Agency:  NA  Status of Service:  Completed, signed off  If discussed at [place] of Stay Meetings, dates discussed:    Additional Comments: 1434 06-09-17 Jacqlyn Krauss, RN, BSN [phone] S/W GW[facility] @ [facility] # [phone]    INS POLICY ONLY COVER GENERIC MEDICINE  TIER- 1 DRUG  CO-PAY - $ 5.00    1.ELIQUIS 50 MG BID  COVER- NOT COVER  PRIOR APPROVAL- NO    PREFERRED PHARMACY : [facility]  Bethena Roys, RN 06/09/2017, 10:05 AM

## 2017-06-09 NOTE — Progress Notes (Signed)
Progress Note  Patient Name: Tammy Davies Date of Encounter: 06/09/2017  Primary Cardiologist: Sinclair Grooms, MD   Subjective   Pt feels well, no chest pain or palpitations. Pt asks about her HR in the 40-50s at rest. She denies dizziness or feelings of pre-syncope. She was instructed to take her pressure daily and call our office if new symptoms.  Inpatient Medications    Scheduled Meds: . apixaban  5 mg Oral BID  . diltiazem  120 mg Oral Daily  . nicotine  7 mg Transdermal Daily   Continuous Infusions:  PRN Meds: acetaminophen **OR** acetaminophen, bisacodyl, HYDROcodone-acetaminophen, nicotine polacrilex, ondansetron **OR** ondansetron (ZOFRAN) IV, senna-docusate   Vital Signs    Vitals:   06/08/17 1306 06/08/17 1500 06/08/17 2009 06/09/17 0500  BP: 123/72 128/61 (!) 134/59 118/72  Pulse: 67 63 60 63  Resp:   18 18  Temp: 98.1 F (36.7 C) 98 F (36.7 C) 98.3 F (36.8 C) 98.4 F (36.9 C)  TempSrc: Oral Oral Oral Oral  SpO2: 97% 98% 96% 100%  Weight: 249 lb 9.6 oz (113.2 kg)   247 lb 3.2 oz (112.1 kg)  Height: 5' 8"  (1.727 m)       Intake/Output Summary (Last 24 hours) at 06/09/2017 1027 Last data filed at 06/09/2017 0500 Gross per 24 hour  Intake 1080 ml  Output -  Net 1080 ml   Filed Weights   06/08/17 1306 06/09/17 0500  Weight: 249 lb 9.6 oz (113.2 kg) 247 lb 3.2 oz (112.1 kg)    Telemetry    Sinus - sinus brady - Personally Reviewed  ECG    Sinus rhythm - Personally Reviewed  Physical Exam   GEN: No acute distress.   Neck: No JVD Cardiac: RRR, no murmurs, rubs, or gallops.  Respiratory: Clear to auscultation bilaterally. GI: Soft, nontender, non-distended  MS: No edema; No deformity. Neuro:  Nonfocal  Psych: Normal affect   Labs    Chemistry Recent Labs  Lab 06/08/17 0213 06/09/17 0438  NA 140 139  K 3.6 4.3  CL 107 105  CO2 23 27  GLUCOSE 134* 88  BUN 14 12  CREATININE 1.00 0.80  CALCIUM 9.6 9.1  GFRNONAA >60 >60   GFRAA >60 >60  ANIONGAP 10 7     Hematology Recent Labs  Lab 06/08/17 0213 06/09/17 0438  WBC 8.2 6.2  RBC 5.20* 4.98  HGB 15.2* 14.4  HCT 46.5* 45.0  MCV 89.4 90.4  MCH 29.2 28.9  MCHC 32.7 32.0  RDW 14.4 14.7  PLT 220 223    Cardiac Enzymes Recent Labs  Lab 06/08/17 0213 06/08/17 0819 06/08/17 1002 06/08/17 1355  TROPONINI <0.03 <0.03 <0.03 <0.03   No results for input(s): TROPIPOC in the last 168 hours.   BNPNo results for input(s): BNP, PROBNP in the last 168 hours.   DDimer No results for input(s): DDIMER in the last 168 hours.   Radiology    Dg Chest Port 1 View  Result Date: 06/08/2017 CLINICAL DATA:  Sudden onset palpitations. EXAM: PORTABLE CHEST 1 VIEW COMPARISON:  03/31/2015 FINDINGS: Normal heart size and pulmonary vascularity. No focal airspace disease or consolidation in the lungs. No blunting of costophrenic angles. No pneumothorax. Mediastinal contours appear intact. Degenerative changes in the shoulders. IMPRESSION: No active disease. Electronically Signed   By: Lucienne Capers M.D.   On: 06/08/2017 02:32    Cardiac Studies   Echo pending   Echo 08/19/16: Study Conclusions - Left ventricle:  The cavity size was normal. Systolic function was normal. The estimated ejection fraction was in the range of 60% to 65%. Wall motion was normal; there were no regional wall motion abnormalities. Left ventricular diastolic function parameters were normal. - Left atrium: The atrium was normal in size. - Tricuspid valve: There was trivial regurgitation.  Patient Profile     57 y.o. female with a hx of transient intra-operative atrial fibrillation with conversion to NSR, gallstone pancreatitis s/p cholecystectomy, and tobacco abuse with otherwise no other significant medical or cardiac history who is being seen for atrial fibrillation.  Assessment & Plan    1. Paroxysmal Atrial fibrillation - converted to NSR on diltiazem drip - cardizem transitioned to PO  120 mg - echo pending - this is a second occurrence of Afib in the last year - This patients CHA2DS2-VASc Score and unadjusted Ischemic Stroke Rate (% per year) is equal to 2.2 % stroke rate/year from a score of 2 (female, HTN) - continue eliquis - have ordered 48-hr holter monitor - have ordered sleep study to be done with our clinic for probable sleep apnea - if echo results normal, will likely discharge home today   2. HTN - pt was borderline hypertensive on arrival, on no home medications - pt states pressures run in the 130s normally - Pressures well-controlled now on PO cardizem   For questions or updates, please contact [place] Please consult [email or web address] for contact info under Cardiology/STEMI.      Signed, [place], PA  06/09/2017, 10:27 AM

## 2017-06-09 NOTE — Discharge Summary (Signed)
Physician Discharge Summary  Tammy Davies AVW:[id number] DOB: 03-23-1961 DOA: 06/08/2017  PCP: Scot Jun, FNP  Admit date: 06/08/2017 Discharge date: 06/09/2017   Recommendations for Outpatient Follow-Up:   Outpatient 48 hour event monitor Outpatient sleep study   Discharge Diagnosis:   Principal Problem:   Atrial fibrillation with RVR ([place]) Active Problems:   Tobacco abuse   Elevated glucose   Hypertension   PAF (paroxysmal atrial fibrillation) [facility])   Discharge disposition:  Home.   Discharge Condition: Improved.  Diet recommendation: Low sodium, heart healthy.  Wound care: None.   History of Present Illness:   Tammy Davies is avery pleasant 57 y.o. female with medical history significant for paroxysmal atrial fibrillation with RVR in April 2018 postoperatively treated with Cardizem, resenting with palpitations while at work, then at midnight, for which she sought medical attention.  She also reported associated heaviness in her chest but not discrete pain, or radiation. She denies any shortness of breath or cough at this time, but last URI was in Nov 2018 and since then, her palpitations were more pronounced, and more frequent.  No other recent surgeries.  She is not on aspirin or any other anticoagulants.  She has never been seen by cardiology as outpatient, only in the hospital (Dr. Marlou Porch) at the time, consider unclear if the patient did have paroxysmal atrial fibrillation, versus just PVCs or PACs.  At the time, was recommended that the patient should try Cardizem 60 mg short acting as needed for palpitations and to follow-up with PCP, which she failed to do.  She denies a history of coronary artery disease, hypertension, hyperlipidemia or diabetes.  She reports drinking significant amount of caffeine in the evening.  She also has a history of tobacco abuse, and moderate obesity. Other decreased factor includes positive family history of cardiac  disease, with both parents with a history of A. fib, pacemaker, and father having MI.     Hospital Course by Problem:    Paroxysmal Atrial fibrillation - converted to NSR on diltiazem drip - cardizem transitioned to PO 120 mg - echo pending - this is a second occurrence of Afib in the last year - This patients CHA2DS2-VASc Score and unadjusted Ischemic Stroke Rate (% per year) is equal to 2.2 % stroke rate/year from a score of 2 (female, HTN) - continue eliquis - 48-hr holter monitor -  sleep study to be done with our clinic for probable sleep apnea - echo:- Left ventricle: The cavity size was normal. There was mild   concentric hypertrophy. Systolic function was normal. The   estimated ejection fraction was in the range of 60% to 65%. Wall   motion was normal; there were no regional wall motion   abnormalities. There was no evidence of elevated ventricular   filling pressure by Doppler parameters. - Left atrium: The atrium was mildly dilated.    HTN - Pressures well-controlled now on PO cardizem       Medical Consultants:    cards   Discharge Exam:   Vitals:   06/09/17 0500 06/09/17 1408  BP: 118/72 122/67  Pulse: 63 (!) 57  Resp: 18   Temp: 98.4 F (36.9 C) 98.5 F (36.9 C)  SpO2: 100% 92%   Vitals:   06/08/17 1500 06/08/17 2009 06/09/17 0500 06/09/17 1408  BP: 128/61 (!) 134/59 118/72 122/67  Pulse: 63 60 63 (!) 57  Resp:  18 18   Temp: 98 F (36.7 C) 98.3 F (36.8 C)  98.4 F (36.9 C) 98.5 F (36.9 C)  TempSrc: Oral Oral Oral Oral  SpO2: 98% 96% 100% 92%  Weight:   112.1 kg (247 lb 3.2 oz)   Height:        Gen:  NAD    The results of significant diagnostics from this hospitalization (including imaging, microbiology, ancillary and laboratory) are listed below for reference.     Procedures and Diagnostic Studies:   Dg Chest Port 1 View  Result Date: 06/08/2017 CLINICAL DATA:  Sudden onset palpitations. EXAM: PORTABLE CHEST 1 VIEW  COMPARISON:  03/31/2015 FINDINGS: Normal heart size and pulmonary vascularity. No focal airspace disease or consolidation in the lungs. No blunting of costophrenic angles. No pneumothorax. Mediastinal contours appear intact. Degenerative changes in the shoulders. IMPRESSION: No active disease. Electronically Signed   By: Lucienne Capers M.D.   On: 06/08/2017 02:32     Labs:   Basic Metabolic Panel: Recent Labs  Lab 06/08/17 0213 06/08/17 0247 06/09/17 0438  NA 140  --  139  K 3.6  --  4.3  CL 107  --  105  CO2 23  --  27  GLUCOSE 134*  --  88  BUN 14  --  12  CREATININE 1.00  --  0.80  CALCIUM 9.6  --  9.1  MG  --  1.9  --    GFR Estimated Creatinine Clearance: 103.1 mL/min (by C-G formula based on SCr of 0.8 mg/dL). Liver Function Tests: No results for input(s): AST, ALT, ALKPHOS, BILITOT, PROT, ALBUMIN in the last 168 hours. No results for input(s): LIPASE, AMYLASE in the last 168 hours. No results for input(s): AMMONIA in the last 168 hours. Coagulation profile Recent Labs  Lab 06/09/17 0438  INR 1.03    CBC: Recent Labs  Lab 06/08/17 0213 06/09/17 0438  WBC 8.2 6.2  HGB 15.2* 14.4  HCT 46.5* 45.0  MCV 89.4 90.4  PLT 220 223   Cardiac Enzymes: Recent Labs  Lab 06/08/17 0213 06/08/17 0819 06/08/17 1002 06/08/17 1355  TROPONINI <0.03 <0.03 <0.03 <0.03   BNP: Invalid input(s): POCBNP CBG: Recent Labs  Lab 06/08/17 1044  GLUCAP 86   D-Dimer No results for input(s): DDIMER in the last 72 hours. Hgb A1c Recent Labs    06/08/17 0819  HGBA1C 5.5   Lipid Profile Recent Labs    06/08/17 0819  CHOL 173  HDL 40*  LDLCALC 110*  TRIG 116  CHOLHDL 4.3   Thyroid function studies Recent Labs    06/08/17 0819  TSH 5.452*   Anemia work up No results for input(s): VITAMINB12, FOLATE, FERRITIN, TIBC, IRON, RETICCTPCT in the last 72 hours. Microbiology No results found for this or any previous visit (from the past 240 hour(s)).   Discharge  Instructions:   Discharge Instructions    Amb referral to AFIB Clinic   Complete by:  As directed    Diet - low sodium heart healthy   Complete by:  As directed    Discharge instructions   Complete by:  As directed    Sleep study and 48 hour holter monitor to be arranged by cardiology   Increase activity slowly   Complete by:  As directed      Allergies as of 06/09/2017   No Known Allergies     Medication List    TAKE these medications   apixaban 5 MG Tabs tablet Commonly known as:  ELIQUIS Take 1 tablet (5 mg total) by mouth 2 (two) times  daily.   diltiazem 120 MG 24 hr capsule Commonly known as:  CARDIZEM CD Take 1 capsule (120 mg total) by mouth daily. Start taking on:  06/10/2017   nicotine 7 mg/24hr patch Commonly known as:  NICODERM CQ - dosed in mg/24 hr Place 1 patch (7 mg total) onto the skin daily. Start taking on:  06/10/2017   nicotine polacrilex 2 MG gum Commonly known as:  NICORETTE Take 1 each (2 mg total) by mouth as needed for smoking cessation.      Follow-up Information    Burtis Junes, NP Follow up on 06/21/2017.   Specialties:  Nurse Practitioner, Interventional Cardiology, Cardiology, Radiology Why:  10:00 AM TCM appt hospital follow up Contact information: [place]. [address] [place] [address] [phone]            Time coordinating discharge: 35 min  Signed:  Geradine Girt   [facility] 06/09/2017, 2:11 PM

## 2017-06-09 NOTE — Telephone Encounter (Signed)
Sent to sleep study pool

## 2017-06-09 NOTE — Telephone Encounter (Signed)
TOC  Phone Call . Appt is 06/21/17 w/ Truitt Merle at [facility]

## 2017-06-09 NOTE — Progress Notes (Signed)
  Echocardiogram 2D Echocardiogram has been performed.  Tammy Davies 06/09/2017, 1:41 PM

## 2017-06-09 NOTE — Discharge Instructions (Signed)

## 2017-06-09 NOTE — Telephone Encounter (Deleted)
-----   Message from Ledora Bottcher, [place] sent at 06/09/2017 10:17 AM EST ----- Regarding: sleep study Can you please help me schedule this patient for a sleep study? She was seen in consult in the hospital.   Many thanks Angie

## 2017-06-09 NOTE — Telephone Encounter (Signed)
-----   Message from Ledora Bottcher, [place] sent at 06/09/2017 10:17 AM EST ----- Regarding: sleep study Can you please help me schedule this patient for a sleep study? She was seen in consult in the hospital.   Many thanks Angie

## 2017-06-09 NOTE — Telephone Encounter (Signed)
**Note De-Identified  Obfuscation** The pt has not been released from the hospital yet. Will call later.

## 2017-06-09 NOTE — Progress Notes (Signed)
Await echo and then read before patient can be discharged. Tammy Davies

## 2017-06-14 NOTE — Telephone Encounter (Signed)
**Note De-Identified  Obfuscation** Patient contacted regarding discharge from [facility] on Wednesday 06/09/2017.  Patient understands to follow up with provider Truitt Merle, NP on 06/21/2017 at 10 am at [facility]. Patient understands discharge instructions? Yes Patient understands medications and regiment? Yes Patient understands to bring all medications to this visit? Yes  The pt states that she is doing fine at this time and is without complaints. She does have [facility]'s phone number to call if she has any questions or concerns.

## 2017-06-18 ENCOUNTER — Ambulatory Visit (INDEPENDENT_AMBULATORY_CARE_PROVIDER_SITE_OTHER): Payer: No Typology Code available for payment source

## 2017-06-18 DIAGNOSIS — I4891 Unspecified atrial fibrillation: Secondary | ICD-10-CM

## 2017-06-21 ENCOUNTER — Ambulatory Visit (INDEPENDENT_AMBULATORY_CARE_PROVIDER_SITE_OTHER): Payer: No Typology Code available for payment source | Admitting: Nurse Practitioner

## 2017-06-21 ENCOUNTER — Encounter: Payer: Self-pay | Admitting: Nurse Practitioner

## 2017-06-21 VITALS — BP 120/70 | HR 69 | Ht 68.0 in | Wt 248.1 lb

## 2017-06-21 DIAGNOSIS — Z720 Tobacco use: Secondary | ICD-10-CM

## 2017-06-21 DIAGNOSIS — I480 Paroxysmal atrial fibrillation: Secondary | ICD-10-CM

## 2017-06-21 MED ORDER — APIXABAN 5 MG PO TABS: 5.0000 mg | ORAL_TABLET | Freq: Two times a day (BID) | ORAL | 6 refills | Status: DC

## 2017-06-21 MED ORDER — DILTIAZEM HCL ER COATED BEADS 120 MG PO CP24: 120.0000 mg | ORAL_CAPSULE | Freq: Every day | ORAL | 11 refills | Status: DC

## 2017-06-21 NOTE — Patient Instructions (Addendum)
We will be checking the following labs today - BMET, CBC and TSH   Medication Instructions:    Continue with your current medicines.  I sent in your refill for Eliquis and Cardizem today     Testing/Procedures To Be Arranged:  N/A  Follow-Up:   See Dr. Tamala Julian in about 4 months.    Other Special Instructions:   Will arrange for sleep study  Keep working on stopping smoking.     If you need a refill on your cardiac medications before your next appointment, please call your pharmacy.   Call the [place] office at [phone] if you have any questions, problems or concerns.

## 2017-06-21 NOTE — Progress Notes (Signed)
CARDIOLOGY OFFICE NOTE  Date:  06/21/2017    Tammy Davies Date of Birth: Mar 20, 1961 Medical Record #[id number]  PCP:  Scot Jun, FNP  Cardiologist:  [facility])  Chief Complaint  Patient presents with  . Palpitations  . Atrial Fibrillation    Post hospital visit - seen for Dr. Raelyn Number    History of Present Illness: Tammy Davies is a 57 y.o. female who presents today for a post hospital visit. Seen for Dr. Tamala Julian.   She has a history of PAF with RVR in April 2018 postoperatively treated with Cardizem.  Presented last month with palpitations and associated heaviness in her chest. She had had a URI in Nov 2018and since then, her palpitations were more pronounced,and more frequent.  Unclear if the patient did have paroxysmal atrial fibrillation, versus just PVCs or PACs back in April of 2018.  She denies a history of coronary artery disease, hypertension, hyperlipidemia or diabetes. She reports drinking significant amount of caffeine.  She also has a history of tobacco abuse,and moderate obesity. Family history + for cardiac disease, with both parents with a history of A. fib, pacemaker, and father having MI.  Seen in consultation by Dr. Tamala Julian - he advised indefinite DOAC therapy, 48 hour Monitor to assess AF burdern, sleep study and echo along with chronic cardiology follow up.   Comes in today. Here alone. Monitor results are pending. Says she is doing better - less palpitations. Trying to stop smoking - using the patches. She just turned in her monitor. No sleep study arranged - she works at night and sleeps during the day. No chest pain. She is trying to restrict her caffeine. Overall, she feels like she is doing better.   Past Medical History:  Diagnosis Date  . Dysrhythmia   . PAF (paroxysmal atrial fibrillation) ([place])   . Tobacco abuse     Past Surgical History:  Procedure Laterality Date  . ABDOMINAL HYSTERECTOMY    .  CHOLECYSTECTOMY N/A 08/18/2016   Procedure: LAPAROSCOPIC CHOLECYSTECTOMY WITH  INTRAOPERATIVE CHOLANGIOGRAM;  Surgeon: Johnathan Hausen, MD;  Location: [facility] ORS;  Service: General;  Laterality: N/A;     Medications: Current Meds  Medication Sig  . apixaban (ELIQUIS) 5 MG TABS tablet Take 1 tablet (5 mg total) by mouth 2 (two) times daily.  Marland Kitchen diltiazem (CARDIZEM CD) 120 MG 24 hr capsule Take 1 capsule (120 mg total) by mouth daily.  . nicotine (NICODERM CQ - DOSED IN MG/24 HR) 7 mg/24hr patch Place 1 patch (7 mg total) onto the skin daily.  . nicotine polacrilex (NICORETTE) 2 MG gum Take 1 each (2 mg total) by mouth as needed for smoking cessation.  . [DISCONTINUED] apixaban (ELIQUIS) 5 MG TABS tablet Take 1 tablet (5 mg total) by mouth 2 (two) times daily.  . [DISCONTINUED] diltiazem (CARDIZEM CD) 120 MG 24 hr capsule Take 1 capsule (120 mg total) by mouth daily.    Allergies: No Known Allergies  Social History: The patient  reports that she has been smoking cigarettes.  she has never used smokeless tobacco. She reports that she uses drugs. Drug: Marijuana. She reports that she does not drink alcohol.   Family History: The patient's family history includes Breast cancer in her mother; Diabetes in her father; Heart failure in her father and mother; Microcephaly in her father.   Review of Systems: Please see the history of present illness.   Otherwise, the review of systems is positive for  none.   All other systems are reviewed and negative.   Physical Exam: VS:  BP 120/70 (BP Location: Left Arm, Patient Position: Sitting, Cuff Size: Large)   Pulse 69   Ht 5\' 8"  (1.727 m)   Wt 248 lb 1.9 oz (112.5 kg)   BMI 37.73 kg/m  .  BMI Body mass index is 37.73 kg/m.  Wt Readings from Last 3 Encounters:  06/21/17 248 lb 1.9 oz (112.5 kg)  06/09/17 247 lb 3.2 oz (112.1 kg)  09/09/16 232 lb (105.2 kg)    General: Alert. She is in no acute distress. Smells heavily of tobacco.   HEENT: Very poor  dentition.  Lots of missing teeth.  Neck: Supple, no JVD, carotid bruits, or masses noted.  Cardiac: Regular rate and rhythm. Heart tones are distant. No edema.  Respiratory:  Lungs coarse but with normal work of breathing.  GI: Soft and nontender.  MS: No deformity or atrophy. Gait and ROM intact.  Skin: Warm and dry. Color is normal.  Neuro:  Strength and sensation are intact and no gross focal deficits noted.  Psych: Alert, appropriate and with normal affect.   LABORATORY DATA:  EKG:  EKG is ordered today. This demonstrates sinus rhythm with LAFB.  Lab Results  Component Value Date   WBC 6.2 06/09/2017   HGB 14.4 06/09/2017   HCT 45.0 06/09/2017   PLT 223 06/09/2017   GLUCOSE 88 06/09/2017   CHOL 173 06/08/2017   TRIG 116 06/08/2017   HDL 40 (L) 06/08/2017   LDLCALC 110 (H) 06/08/2017   ALT 19 09/09/2016   AST 16 09/09/2016   NA 139 06/09/2017   K 4.3 06/09/2017   CL 105 06/09/2017   CREATININE 0.80 06/09/2017   BUN 12 06/09/2017   CO2 27 06/09/2017   TSH 5.452 (H) 06/08/2017   INR 1.03 06/09/2017   HGBA1C 5.5 06/08/2017     BNP (last 3 results) No results for input(s): BNP in the last 8760 hours.  ProBNP (last 3 results) No results for input(s): PROBNP in the last 8760 hours.   Other Studies Reviewed Today:  Echo Study Conclusions January 2019  - Left ventricle: The cavity size was normal. There was mild   concentric hypertrophy. Systolic function was normal. The   estimated ejection fraction was in the range of 60% to 65%. Wall   motion was normal; there were no regional wall motion   abnormalities. There was no evidence of elevated ventricular   filling pressure by Doppler parameters. - Left atrium: The atrium was mildly dilated.   Assessment/Plan:  1. PAF - has had 2 spells of AF over that past 8 months - CHADSVASC is 2 - now on DOAC - she is doing ok clinically. Her monitor is pending. Will arrange sleep study. BP is controlled.   2.  Obesity  3. Tobacco abuse - she is trying to stop.   4. Probable OSA - needs sleep study  5. HTN - BP looks good on current regimen.    Current medicines are reviewed with the patient today.  The patient does not have concerns regarding medicines other than what has been noted above.  The following changes have been made:  See above.  Labs/ tests ordered today include:    Orders Placed This Encounter  Procedures  . Basic metabolic panel  . CBC  . TSH  . EKG 12-Lead     Disposition:   FU with Dr. Tamala Julian in 4 months.  Patient is agreeable to this plan and will call if any problems develop in the interim.   SignedTruitt Merle, NP  06/21/2017 10:30 AM  [facility] [address] [place], [place]  [address] Phone: [phone] Fax: [phone]

## 2017-06-22 ENCOUNTER — Telehealth: Payer: Self-pay | Admitting: *Deleted

## 2017-06-22 LAB — CBC
Hematocrit: 42.7 % (ref 34.0–46.6)
Hemoglobin: 14 g/dL (ref 11.1–15.9)
MCH: 28.5 pg (ref 26.6–33.0)
MCHC: 32.8 g/dL (ref 31.5–35.7)
MCV: 87 fL (ref 79–97)
Platelets: 237 10*3/uL (ref 150–379)
RBC: 4.91 x10E6/uL (ref 3.77–5.28)
RDW: 14.9 % (ref 12.3–15.4)
WBC: 8.3 10*3/uL (ref 3.4–10.8)

## 2017-06-22 LAB — BASIC METABOLIC PANEL
BUN/Creatinine Ratio: 20 (ref 9–23)
BUN: 17 mg/dL (ref 6–24)
CO2: 23 mmol/L (ref 20–29)
Calcium: 9.5 mg/dL (ref 8.7–10.2)
Chloride: 106 mmol/L (ref 96–106)
Creatinine, Ser: 0.85 mg/dL (ref 0.57–1.00)
GFR calc Af Amer: 89 mL/min/{1.73_m2} (ref >59)
GFR calc non Af Amer: 77 mL/min/{1.73_m2} (ref >59)
Glucose: 112 mg/dL — ABNORMAL HIGH (ref 65–99)
Potassium: 4.2 mmol/L (ref 3.5–5.2)
Sodium: 143 mmol/L (ref 134–144)

## 2017-06-22 LAB — TSH: TSH: 4.09 u[IU]/mL (ref 0.450–4.500)

## 2017-06-22 NOTE — Telephone Encounter (Signed)
Sent to sleep pool. 

## 2017-06-22 NOTE — Telephone Encounter (Signed)
-----   Message from Burtis Junes, NP sent at 06/21/2017 10:29 AM EST ----- Needs sleep study  Thanks lori

## 2017-07-23 NOTE — Telephone Encounter (Signed)
RE: pre cert  Tammy Davies, CMA        Per pt's insurance, she has a limited plan and it does not cover sleep studies. She would have to self pay. Cost is around $3,000.      ----- Message -----  From: Freada Bergeron, CMA  Sent: 06/22/2017  4:42 PM  To: [facility] Sleep Studies  Subject: pre cert                     Thanks  ----- Message -----  From: Burtis Junes, NP  Sent: 06/21/2017 10:29 AM  To: Freada Bergeron, CMA   Needs sleep study   Thanks  lori

## 2017-08-23 ENCOUNTER — Telehealth: Payer: Self-pay

## 2017-08-23 ENCOUNTER — Other Ambulatory Visit: Payer: Self-pay

## 2017-08-23 MED ORDER — APIXABAN 5 MG PO TABS: 5.0000 mg | ORAL_TABLET | Freq: Two times a day (BID) | ORAL | 3 refills | Status: DC

## 2017-08-23 NOTE — Telephone Encounter (Signed)
**Note De-Identified  Obfuscation** The pt dropped of the provider part of her [employer] pt assistance application for Eliquis. She requests that we complete the form and have Truitt Merle, NP sign it and an Eliquis RX then call her when it is ready as she is going to mail to [employer].  I have completed the form and printed an Eliquis RX. Cecille Rubin has signed both the application and the Eliquis RX and I have left a detailed message on the pts VM stating that she may pick up the provider part of the application and the RX at her convenience between 8am-5pm M-F.

## 2017-09-10 NOTE — Telephone Encounter (Signed)
**Note De-Identified [name] Obfuscation** Eliquis approval received [name] fax from [name] assistance [employer]. Approval good until 05/17/2018. Application Case# [id number]

## 2017-10-11 ENCOUNTER — Encounter (HOSPITAL_COMMUNITY): Payer: Self-pay | Admitting: Emergency Medicine

## 2017-10-11 ENCOUNTER — Emergency Department (HOSPITAL_COMMUNITY): Payer: No Typology Code available for payment source

## 2017-10-11 ENCOUNTER — Emergency Department (HOSPITAL_COMMUNITY)
Admission: EM | Admit: 2017-10-11 | Discharge: 2017-10-11 | Disposition: A | Discharge status: Home / Self Care | Payer: No Typology Code available for payment source | Attending: Emergency Medicine | Admitting: Emergency Medicine

## 2017-10-11 DIAGNOSIS — Z79899 Other long term (current) drug therapy: Secondary | ICD-10-CM | POA: Diagnosis not present

## 2017-10-11 DIAGNOSIS — F17210 Nicotine dependence, cigarettes, uncomplicated: Secondary | ICD-10-CM | POA: Insufficient documentation

## 2017-10-11 DIAGNOSIS — R002 Palpitations: Secondary | ICD-10-CM | POA: Insufficient documentation

## 2017-10-11 DIAGNOSIS — Z7901 Long term (current) use of anticoagulants: Secondary | ICD-10-CM | POA: Diagnosis not present

## 2017-10-11 DIAGNOSIS — R0789 Other chest pain: Secondary | ICD-10-CM | POA: Diagnosis present

## 2017-10-11 DIAGNOSIS — I480 Paroxysmal atrial fibrillation: Secondary | ICD-10-CM | POA: Diagnosis not present

## 2017-10-11 DIAGNOSIS — I10 Essential (primary) hypertension: Secondary | ICD-10-CM | POA: Insufficient documentation

## 2017-10-11 LAB — CBC
HCT: 46.8 % — ABNORMAL HIGH (ref 36.0–46.0)
HEMOGLOBIN: 14.8 g/dL (ref 12.0–15.0)
MCH: 27.8 pg (ref 26.0–34.0)
MCHC: 31.6 g/dL (ref 30.0–36.0)
MCV: 88 fL (ref 78.0–100.0)
Platelets: 256 10*3/uL (ref 150–400)
RBC: 5.32 MIL/uL — AB (ref 3.87–5.11)
RDW: 14.6 % (ref 11.5–15.5)
WBC: 8.1 10*3/uL (ref 4.0–10.5)

## 2017-10-11 LAB — I-STAT TROPONIN, ED: TROPONIN I, POC: 0 ng/mL (ref 0.00–0.08)

## 2017-10-11 LAB — BASIC METABOLIC PANEL
Anion gap: 9 (ref 5–15)
BUN: 16 mg/dL (ref 6–20)
CALCIUM: 9.5 mg/dL (ref 8.9–10.3)
CHLORIDE: 105 mmol/L (ref 101–111)
CO2: 26 mmol/L (ref 22–32)
Creatinine, Ser: 1.04 mg/dL — ABNORMAL HIGH (ref 0.44–1.00)
GFR calc non Af Amer: 59 mL/min — ABNORMAL LOW (ref >60)
Glucose, Bld: 110 mg/dL — ABNORMAL HIGH (ref 65–99)
Potassium: 4 mmol/L (ref 3.5–5.1)
Sodium: 140 mmol/L (ref 135–145)

## 2017-10-11 NOTE — ED Triage Notes (Signed)
Pt c/o chest "heaviness" onset in last few hours while working. Afib dx Jan 19, controlled since that time, pt states pulse was 130 with some shob.  Pt states she did have a new coffee tonight while at work.  A & O, states heaviness not as bad at this time.

## 2017-10-11 NOTE — Discharge Instructions (Addendum)
Please follow up closely with your cardiologist for further care.  Decrease amount of caffeine intake may help with your condition. Continue to decrease tobacco use.

## 2017-10-11 NOTE — ED Provider Notes (Signed)
[facility] EMERGENCY DEPARTMENT Provider Note   CSN: [id number] Arrival date & time: 10/11/17  0516     History   Chief Complaint Chief Complaint  Patient presents with  . Atrial Fibrillation    HPI Tammy Davies is a 57 y.o. female.  HPI   57 year old female with history of atrial fibrillation currently on Cardizem presenting for evaluation of chest heaviness. Patient worked third shift.  She is a heavy coffee drinker.  She report during his shifts, she was experiencing intermittent episodes of chest heaviness, heart racing, and mild lightheadedness.  She had her heart rate checked and it was 130.  She was recommended to come to ER by her nurse for further evaluation.  Since she has been in the ER, she is back to her normal baseline.  Currently denies any active chest pain, heart palpitation, lightheadedness, dizziness, shortness of breath, nausea.  She did report drinking a different kind of coffee last night but states that her symptoms started before that.  Otherwise, she has been cutting down on her cigarette consumption to approximately a pack every 3 to 4 days.  She denies alcohol use.  She denies using energy drinks or any other change in medication.  She does have an appointment with her cardiologist next week.  Past Medical History:  Diagnosis Date  . Dysrhythmia   . PAF (paroxysmal atrial fibrillation) ([place])   . Tobacco abuse     Patient Active Problem List   Diagnosis Date Noted  . Atrial fibrillation with RVR ([place]) 06/08/2017  . Elevated glucose 06/08/2017  . Hypertension 06/08/2017  . PAF (paroxysmal atrial fibrillation) ([place]) 06/08/2017  . S/P laparoscopic cholecystectomy 08/20/2016  . Tobacco abuse 08/20/2016  . AF (paroxysmal atrial fibrillation) ([place]) 08/20/2016  . Pancreatitis 08/15/2016  . Liver mass, 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    Past Surgical History:  Procedure Laterality Date  . ABDOMINAL HYSTERECTOMY    . CHOLECYSTECTOMY N/A  08/18/2016   Procedure: LAPAROSCOPIC CHOLECYSTECTOMY WITH  INTRAOPERATIVE CHOLANGIOGRAM;  Surgeon: Johnathan Hausen, MD;  Location: [facility];  Service: General;  Laterality: N/A;     OB History   None      Home Medications    Prior to Admission medications   Medication Sig Start Date End Date Taking? Authorizing Provider  apixaban (ELIQUIS) 5 MG TABS tablet Take 1 tablet (5 mg total) by mouth 2 (two) times daily. 08/23/17   Burtis Junes, NP  diltiazem (CARDIZEM CD) 120 MG 24 hr capsule Take 1 capsule (120 mg total) by mouth daily. 06/21/17   Burtis Junes, NP  nicotine (NICODERM CQ - DOSED IN MG/24 HR) 7 mg/24hr patch Place 1 patch (7 mg total) onto the skin daily. 06/10/17   Geradine Girt, DO  nicotine polacrilex (NICORETTE) 2 MG gum Take 1 each (2 mg total) by mouth as needed for smoking cessation. 06/09/17   Geradine Girt, DO    Family History Family History  Problem Relation Age of Onset  . Heart failure Mother   . Breast cancer Mother   . Heart failure Father   . Diabetes Father   . Microcephaly Father     Social History Social History   Tobacco Use  . Smoking status: Current Every Day Smoker    Types: Cigarettes  . Smokeless tobacco: Never Used  Substance Use Topics  . Alcohol use: No  . Drug use: Yes    Types: Marijuana     Allergies   Patient has  no known allergies.   Review of Systems Review of Systems  All other systems reviewed and are negative.    Physical Exam Updated Vital Signs BP 114/73   Pulse 78   Temp 97.7 F (36.5 C) (Oral)   Resp (!) 21   Ht 5\' 8"  (1.727 m)   Wt 104.3 kg (230 lb)   SpO2 99%   BMI 34.97 kg/m   Physical Exam  Constitutional: She is oriented to person, place, and time. She appears well-developed and well-nourished. No distress.  Obese female resting in bed comfortably in no acute distress.  HENT:  Head: Atraumatic.  Poor dentition  Eyes: Conjunctivae are normal.  Neck: Neck supple.  Cardiovascular: Normal  rate and regular rhythm.  Pulmonary/Chest: Effort normal and breath sounds normal.  Abdominal: Soft. Bowel sounds are normal. She exhibits no distension. There is no tenderness.  Musculoskeletal: She exhibits no edema.  Neurological: She is alert and oriented to person, place, and time.  Skin: No rash noted.  Psychiatric: She has a normal mood and affect.  Nursing note and vitals reviewed.    ED Treatments / Results  Labs (all labs ordered are listed, but only abnormal results are displayed) Labs Reviewed  BASIC METABOLIC PANEL - Abnormal; Notable for the following components:      Result Value   Glucose, Bld 110 (*)    Creatinine, Ser 1.04 (*)    GFR calc non Af Amer 59 (*)    All other components within normal limits  CBC - Abnormal; Notable for the following components:   RBC 5.32 (*)    HCT 46.8 (*)    All other components within normal limits  I-STAT TROPONIN, ED    EKG EKG Interpretation  Date/Time:  Monday Oct 11 2017 05:23:30 EDT Ventricular Rate:  95 PR Interval:  152 QRS Duration: 106 QT Interval:  362 QTC Calculation: 062 R Axis:   -53 Text Interpretation:  Normal sinus rhythm with sinus arrhythmia Pulmonary disease pattern Incomplete right bundle branch block Left anterior fascicular block Abnormal ECG When compared with ECG of 06/08/2017, No significant change was found Confirmed by Delora Fuel ([id number]) on 10/11/2017 5:59:37 AM   Radiology Dg Chest 2 View  Result Date: 10/11/2017 CLINICAL DATA:  Chest heaviness for a few hours while working. Atrial fibrillation diagnosed January 2019. Shortness of breath. EXAM: CHEST - 2 VIEW COMPARISON:  06/08/2017 FINDINGS: The heart size and mediastinal contours are within normal limits. Both lungs are clear. The visualized skeletal structures are unremarkable. IMPRESSION: No active cardiopulmonary disease. Electronically Signed   By: Lucienne Capers M.D.   On: 10/11/2017 05:55    Procedures Procedures (including  critical care time)  Medications Ordered in ED Medications - No data to display   Initial Impression / Assessment and Plan / ED Course  I have reviewed the triage vital signs and the nursing notes.  Pertinent labs & imaging results that were available during my care of the patient were reviewed by me and considered in my medical decision making (see chart for details).     BP 114/73   Pulse 78   Temp 97.7 F (36.5 C) (Oral)   Resp (!) 21   Ht 5\' 8"  (1.727 m)   Wt 104.3 kg (230 lb)   SpO2 99%   BMI 34.97 kg/m    Final Clinical Impressions(s) / ED Diagnoses   Final diagnoses:  Heart palpitations    ED Discharge Orders    None  7:13 AM Patient here with heart palpitation.  Known history of atrial relations with RVR in the past.  She is currently on Cardizem.  She does have a coffee drinker.  Her symptom has since abated.  She is currently under on DOAC.  States she has been compliant with her medication.  EKG shows sinus rhythm without tachycardia and no ischemic changes.  Labs are reassuring.  Patient stable for discharge.  She will follow-up closely with cardiology for further management.  Return precautions given.   Domenic Moras, PA-C 10/11/17 [place], [place], DO 10/11/17 [phone]

## 2017-10-20 ENCOUNTER — Ambulatory Visit: Payer: No Typology Code available for payment source | Admitting: Interventional Cardiology

## 2018-02-08 NOTE — Progress Notes (Deleted)
Cardiology Office Note:    Date:  02/08/2018   ID:  Tammy Davies, DOB 1960-06-09, MRN [id number]  PCP:  Scot Jun, FNP  Cardiologist:  Sinclair Grooms, MD   Referring MD: Scot Jun, FNP   No chief complaint on file. ***  History of Present Illness:    Tammy Davies is a 57 y.o. female with a hx of paroxysmal atrial fibrillation, hypertension, tobacco abuse, possible sleep apnea and hyperlipidemia.  Past Medical History:  Diagnosis Date  . Dysrhythmia   . PAF (paroxysmal atrial fibrillation) ([place])   . Tobacco abuse     Past Surgical History:  Procedure Laterality Date  . ABDOMINAL HYSTERECTOMY    . CHOLECYSTECTOMY N/A 08/18/2016   Procedure: LAPAROSCOPIC CHOLECYSTECTOMY WITH  INTRAOPERATIVE CHOLANGIOGRAM;  Surgeon: Johnathan Hausen, MD;  Location: [facility];  Service: General;  Laterality: N/A;    Current Medications: No outpatient medications have been marked as taking for the 02/09/18 encounter (Appointment) with Belva Crome, MD.     Allergies:   Patient has no known allergies.   Social History   Socioeconomic History  . Marital status: Single    Spouse name: Not on file  . Number of children: Not on file  . Years of education: Not on file  . Highest education level: Not on file  Occupational History  . Not on file  Social Needs  . Financial resource strain: Not on file  . Food insecurity:    Worry: Not on file    Inability: Not on file  . Transportation needs:    Medical: Not on file    Non-medical: Not on file  Tobacco Use  . Smoking status: Current Every Day Smoker    Types: Cigarettes  . Smokeless tobacco: Never Used  Substance and Sexual Activity  . Alcohol use: No  . Drug use: Yes    Types: Marijuana  . Sexual activity: Never  Lifestyle  . Physical activity:    Days per week: Not on file    Minutes per session: Not on file  . Stress: Not on file  Relationships  . Social connections:    Talks on phone: Not on file     Gets together: Not on file    Attends religious service: Not on file    Active member of club or organization: Not on file    Attends meetings of clubs or organizations: Not on file    Relationship status: Not on file  Other Topics Concern  . Not on file  Social History Narrative  . Not on file     Family History: The patient's ***family history includes Breast cancer in her mother; Diabetes in her father; Heart failure in her father and mother; Microcephaly in her father.  ROS:   Please see the history of present illness.    *** All other systems reviewed and are negative.  EKGs/Labs/Other Studies Reviewed:    The following studies were reviewed today: ***  EKG:  EKG is *** ordered today.  The ekg ordered today demonstrates ***  Recent Labs: 06/08/2017: Magnesium 1.9 06/21/2017: TSH 4.090 10/11/2017: BUN 16; Creatinine, Ser 1.04; Hemoglobin 14.8; Platelets 256; Potassium 4.0; Sodium 140  Recent Lipid Panel    Component Value Date/Time   CHOL 173 06/08/2017 0819   TRIG 116 06/08/2017 0819   HDL 40 (L) 06/08/2017 0819   CHOLHDL 4.3 06/08/2017 0819   VLDL 23 06/08/2017 0819   LDLCALC 110 (H) 06/08/2017 5885  Physical Exam:    VS:  There were no vitals taken for this visit.    Wt Readings from Last 3 Encounters:  10/11/17 230 lb (104.3 kg)  06/21/17 248 lb 1.9 oz (112.5 kg)  06/09/17 247 lb 3.2 oz (112.1 kg)     GEN: *** Well nourished, well developed in no acute distress HEENT: Normal NECK: No JVD. LYMPHATICS: No lymphadenopathy CARDIAC: ***RRR, ***murmur, ***gallop, *** edema. VASCULAR: *** pulses. *** bruits. RESPIRATORY:  Clear to auscultation without rales, wheezing or rhonchi  ABDOMEN: Soft, non-tender, non-distended, No pulsatile mass, MUSCULOSKELETAL: No deformity  SKIN: Warm and dry NEUROLOGIC:  Alert and oriented x 3 PSYCHIATRIC:  Normal affect   ASSESSMENT:    1. AF (paroxysmal atrial fibrillation) ([facility])   2. Essential hypertension   3.  Tobacco abuse   4. Chronic anticoagulation    PLAN:    In order of problems listed above:  1. ***   Medication Adjustments/Labs and Tests Ordered: Current medicines are reviewed at length with the patient today.  Concerns regarding medicines are outlined above.  No orders of the defined types were placed in this encounter.  No orders of the defined types were placed in this encounter.   There are no Patient Instructions on file for this visit.   Signed, Sinclair Grooms, MD  02/08/2018 3:30 PM    [place]

## 2018-02-09 ENCOUNTER — Ambulatory Visit: Payer: No Typology Code available for payment source | Admitting: Interventional Cardiology

## 2018-02-10 ENCOUNTER — Encounter: Payer: Self-pay | Admitting: Interventional Cardiology

## 2018-04-07 IMAGING — US US ABDOMEN LIMITED
1 series · 14 of 25 positions shown · non-contrast
Comparison: None.

CLINICAL DATA: Acute onset of right upper quadrant abdominal pain
and vomiting. Initial encounter.

EXAM:
US ABDOMEN LIMITED - RIGHT UPPER QUADRANT

[Series 1: us abdomen limited · 0.25mm/px · 14 of 57 slices shown]
[im 1/57]
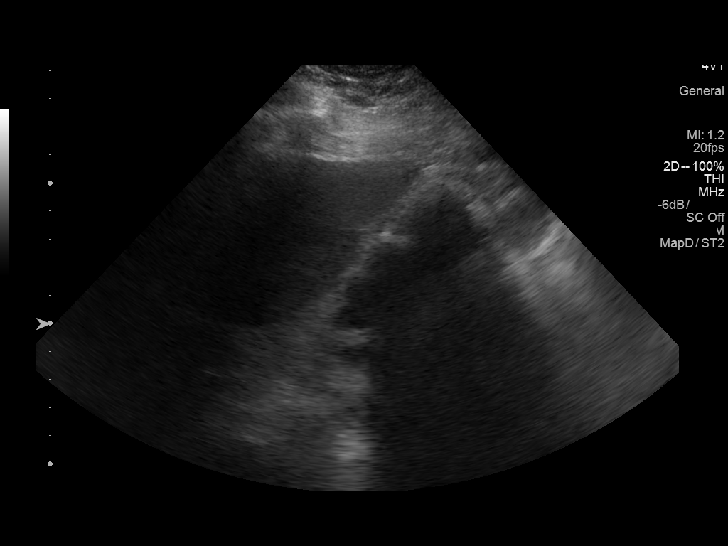
[im 5/57]
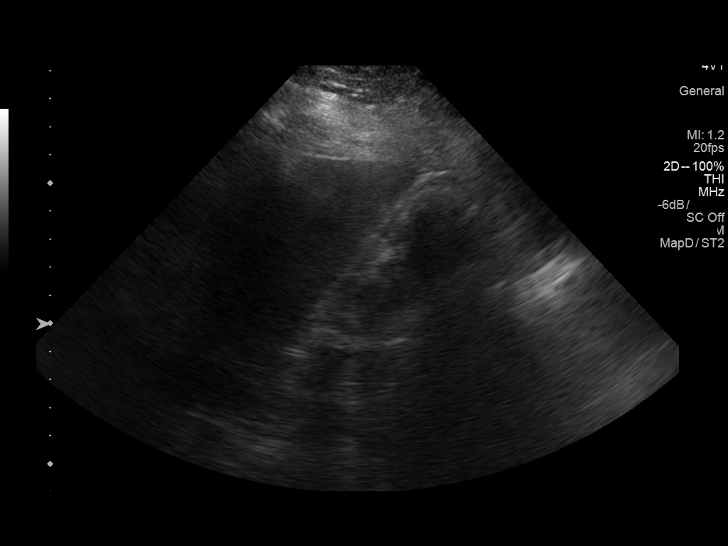
[im 10/57]
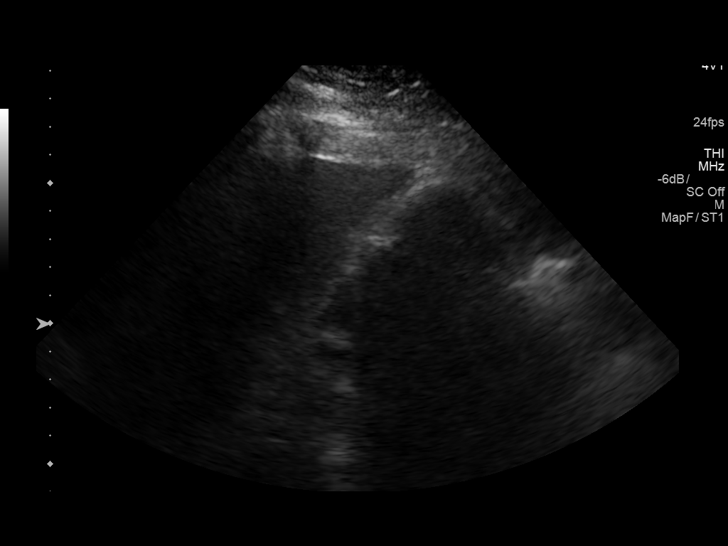
[im 15/57]
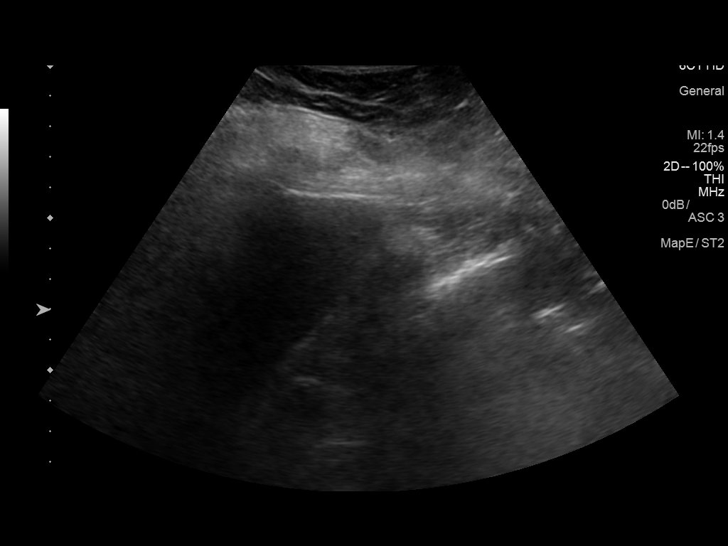
[im 19/57]
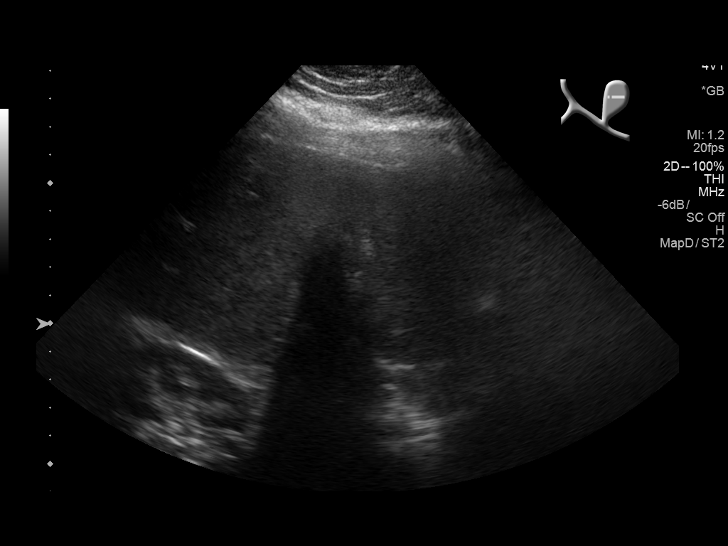
[im 22/57]
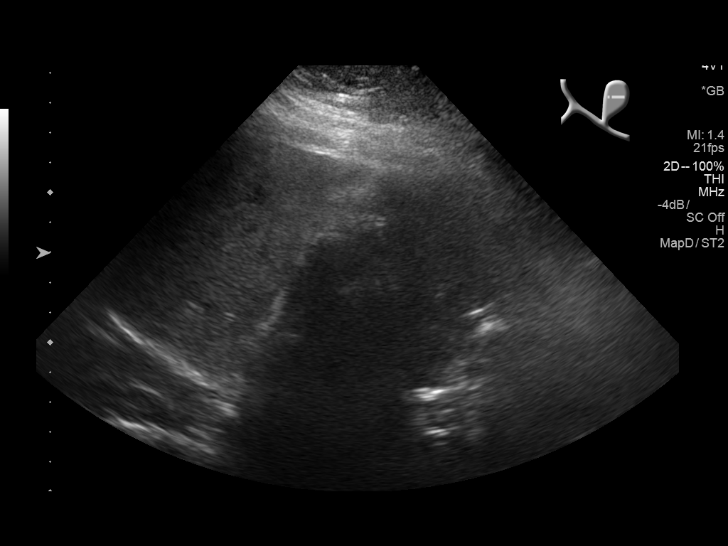
[im 26/57]
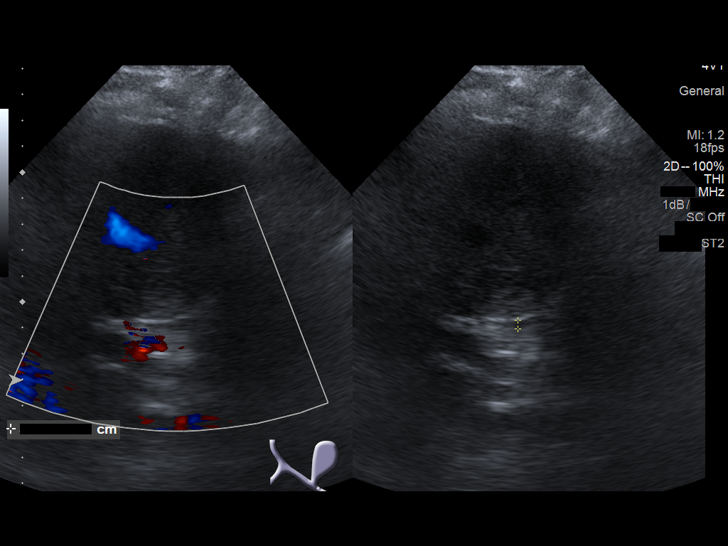
[im 31/57]
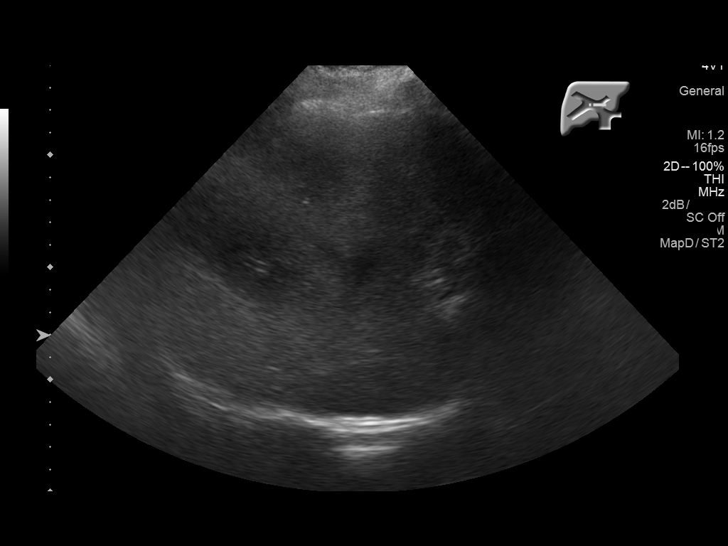
[im 36/57]
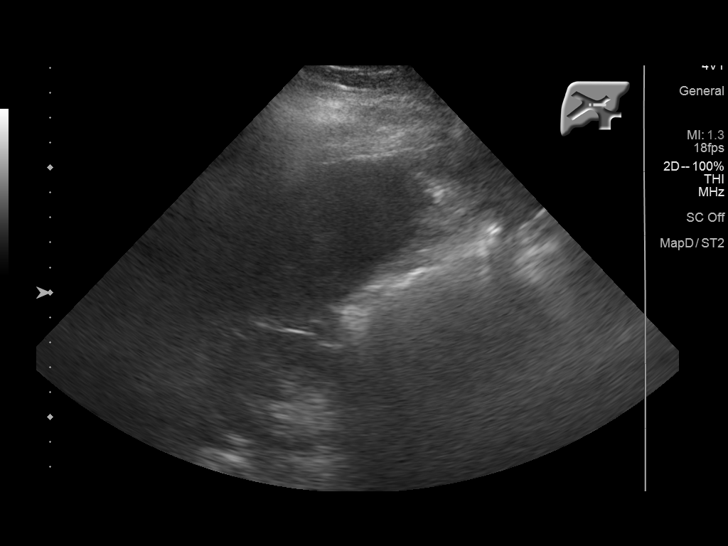
[im 38/57]
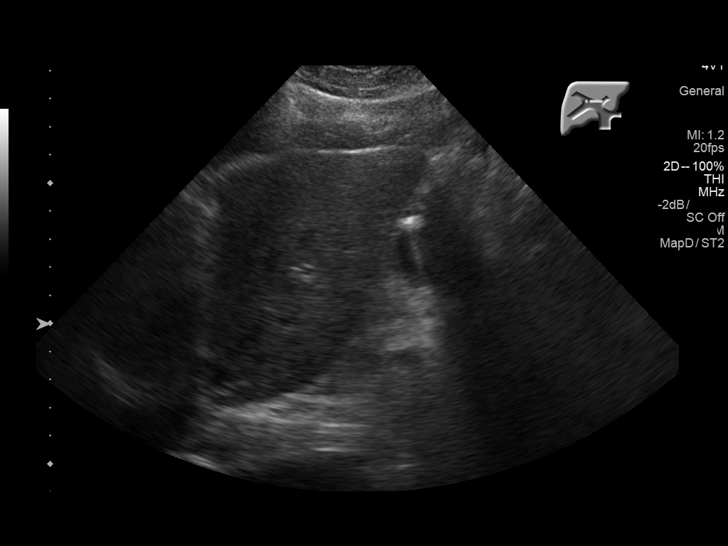
[im 43/57]
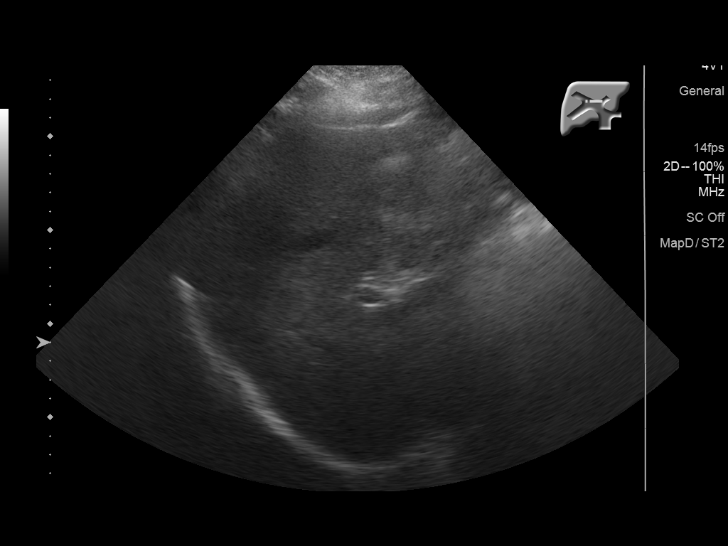
[im 47/57]
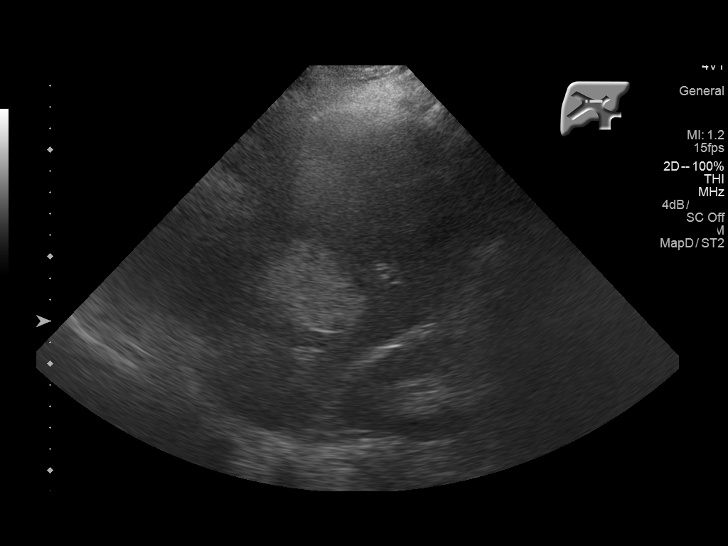
[im 52/57]
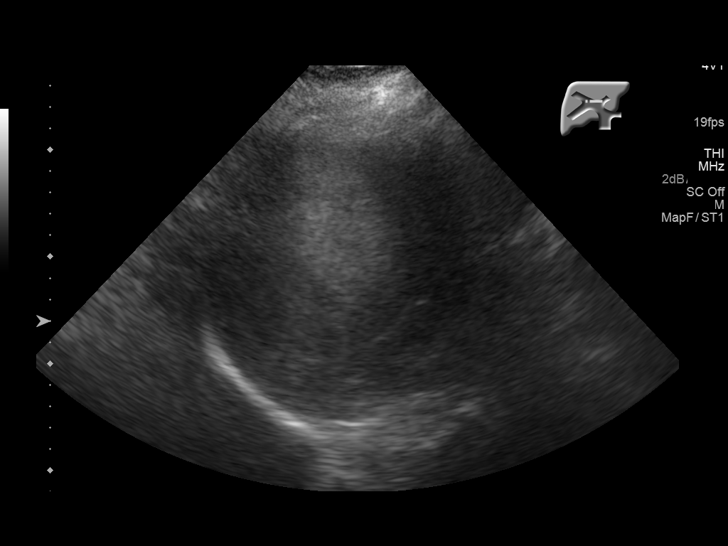
[im 57/57]
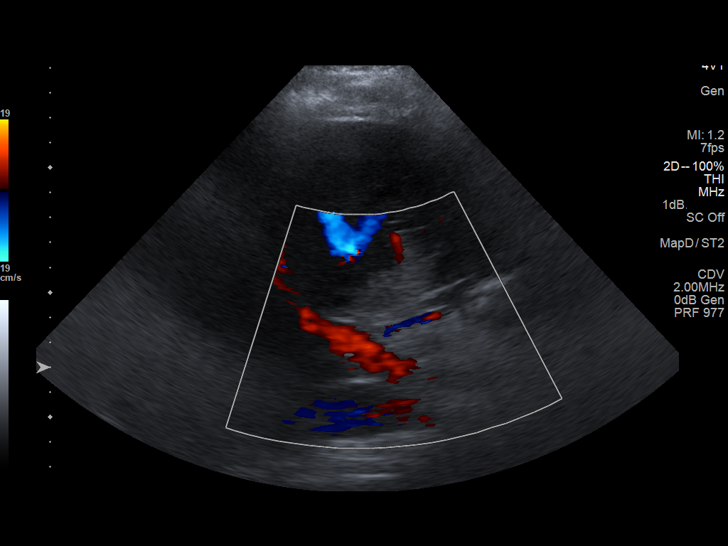

[14 of 25 positions shown; findings below may reference images not displayed]

FINDINGS: Gallbladder:

The gallbladder is filled with stones, with a wall echo shadow sign.
No definite gallbladder wall thickening or pericholecystic fluid is
seen. No ultrasonographic Murphy's sign is elicited.

Common bile duct:

Diameter: 0.3 cm, within normal limits in caliber.

Liver:

A large echogenic lesion is noted at the right hepatic lobe,
measuring 4.6 x 4.2 x 3.8 cm. Though this could reflect an
hemangioma or adenoma, malignancy cannot be entirely excluded.
Otherwise within normal limits in parenchymal echogenicity.
IMPRESSION: 1. Gallbladder filled with stones, with a wall echo shadow sign. No
evidence for obstruction or cholecystitis.
2. Large 4.6 cm echogenic lesion at the right hepatic lobe. Though
this could reflect an hemangioma or adenoma, malignancy cannot be
entirely excluded. Dynamic liver protocol MRI or CT would be helpful
for further evaluation.

## 2018-04-10 NOTE — Progress Notes (Signed)
Cardiology Office Note:    Date:  04/11/2018   ID:  [name], DOB [birth year], MRN [id number]  PCP:  Scot Jun, FNP  Cardiologist:  Sinclair Grooms, MD   Referring MD: Scot Jun, FNP   Chief Complaint  Patient presents with  . Atrial Fibrillation    History of Present Illness:    MARGERT EDSALL is a 57 y.o. female with a hx of  PAF, prediabetes, anticoagulation, CHADS VASC ~ 1,  and hypertension.  She does not have a primary care physician.  She continues to smoke cigarettes.  Her sister is a hospitalist and observed her sleeping.  She was left with concern that Mrs. Forse has sleep apnea.  She denies palpitations, fatigue, and dyspnea which are symptoms that were associated with episode of atrial fibrillation.  No bleeding complications on apixaban.  Past Medical History:  Diagnosis Date  . Dysrhythmia   . PAF (paroxysmal atrial fibrillation) ([place])   . Tobacco abuse     Past Surgical History:  Procedure Laterality Date  . ABDOMINAL HYSTERECTOMY    . CHOLECYSTECTOMY N/A 08/18/2016   Procedure: LAPAROSCOPIC CHOLECYSTECTOMY WITH  INTRAOPERATIVE CHOLANGIOGRAM;  Surgeon: Johnathan Hausen, MD;  Location: [facility];  Service: General;  Laterality: N/A;    Current Medications: Current Meds  Medication Sig  . apixaban (ELIQUIS) 5 MG TABS tablet Take 1 tablet (5 mg total) by mouth 2 (two) times daily.  Marland Kitchen diltiazem (CARDIZEM CD) 120 MG 24 hr capsule Take 1 capsule (120 mg total) by mouth daily.  . [DISCONTINUED] diltiazem (CARDIZEM CD) 120 MG 24 hr capsule Take 1 capsule (120 mg total) by mouth daily.     Allergies:   Patient has no known allergies.   Social History   Socioeconomic History  . Marital status: Single    Spouse name: Not on file  . Number of children: Not on file  . Years of education: Not on file  . Highest education level: Not on file  Occupational History  . Not on file  Social Needs  . Financial resource strain: Not on  file  . Food insecurity:    Worry: Not on file    Inability: Not on file  . Transportation needs:    Medical: Not on file    Non-medical: Not on file  Tobacco Use  . Smoking status: Current Every Day Smoker    Types: Cigarettes  . Smokeless tobacco: Never Used  Substance and Sexual Activity  . Alcohol use: No  . Drug use: Yes    Types: Marijuana  . Sexual activity: Never  Lifestyle  . Physical activity:    Days per week: Not on file    Minutes per session: Not on file  . Stress: Not on file  Relationships  . Social connections:    Talks on phone: Not on file    Gets together: Not on file    Attends religious service: Not on file    Active member of club or organization: Not on file    Attends meetings of clubs or organizations: Not on file    Relationship status: Not on file  Other Topics Concern  . Not on file  Social History Narrative  . Not on file     Family History: The patient's family history includes Breast cancer in her mother; Diabetes in her father; Heart failure in her father and mother; Microcephaly in her father.  ROS:   Please see the history of present  illness.    Snoring, difficulty urinating, cough, back discomfort, easy bruising, and fatigue.  All other systems reviewed and are negative.  EKGs/Labs/Other Studies Reviewed:    The following studies were reviewed today: 48 HR. Holter 06/18/2017: Study Highlights     Normal sinus rhythm  PACs and rare PVCs  Rare supraventricular runs less than 10 beats at or less than 135 bpm  No symptoms reported  No atrial fibrillation  Supraventricular ectopy accounts for less than 1% of cardiac activity.   Normal sinus rhythm Rare PACs Rare nonsustained supraventricular runs No atrial fibrillation   2D Doppler Echocardiogram: Study Conclusions  - Left ventricle: The cavity size was normal. There was mild   concentric hypertrophy. Systolic function was normal. The   estimated ejection fraction  was in the range of 60% to 65%. Wall   motion was normal; there were no regional wall motion   abnormalities. There was no evidence of elevated ventricular   filling pressure by Doppler parameters. - Left atrium: The atrium was mildly dilated.  EKG:  EKG is not performed today.  Recent Labs: 06/08/2017: Magnesium 1.9 06/21/2017: TSH 4.090 10/11/2017: BUN 16; Creatinine, Ser 1.04; Hemoglobin 14.8; Platelets 256; Potassium 4.0; Sodium 140  Recent Lipid Panel    Component Value Date/Time   CHOL 173 06/08/2017 0819   TRIG 116 06/08/2017 0819   HDL 40 (L) 06/08/2017 0819   CHOLHDL 4.3 06/08/2017 0819   VLDL 23 06/08/2017 0819   LDLCALC 110 (H) 06/08/2017 0819    Physical Exam:    VS:  BP 118/76   Pulse 68   Ht 5' 8" (1.727 m)   Wt 238 lb 12.8 oz (108.3 kg)   SpO2 92%   BMI 36.31 kg/m     Wt Readings from Last 3 Encounters:  04/11/18 238 lb 12.8 oz (108.3 kg)  10/11/17 230 lb (104.3 kg)  06/21/17 248 lb 1.9 oz (112.5 kg)     GEN: Severe obesity well developed in no acute distress HEENT: Normal NECK: No JVD. LYMPHATICS: No lymphadenopathy CARDIAC: RRR, no murmur, S4 gallop, no edema. VASCULAR: 2+ bilateral carotid and radial pulses.  No bruits. RESPIRATORY:  Clear to auscultation without rales, wheezing or rhonchi  ABDOMEN: Soft, non-tender, non-distended, No pulsatile mass, MUSCULOSKELETAL: No deformity  SKIN: Warm and dry NEUROLOGIC:  Alert and oriented x 3 PSYCHIATRIC:  Normal affect   ASSESSMENT:    1. AF (paroxysmal atrial fibrillation) ([facility])   2. Tobacco abuse   3. Essential hypertension   4. Sleep apnea, unspecified type   5. Morbid obesity ([place])    PLAN:    In order of problems listed above:  1. No symptomatic episodes of atrial fibrillation since recent hospital visit.  The monitor as noted above revealed frequent episodes of supraventricular ectopy noted episodes of atrial fibrillation.  Her Chads Vasc is 2 (female, hypertension) and there is family  history of atrial fib in both parents and a sibling.  Long discussion concerning management strategies and atrial fibrillation.  Please see below. 2. Strongly encouraged to discontinue smoking.  Unable to commit at this time. 3. Low-salt diet and weight loss are encouraged.  Blood pressures are quite good.  Target less than 130/80 mmHg. 4. Unable to currently perform the sleep study because of insurance issues and out-of-pocket costs.  This is a significant risk factor for redevelopment of atrial fibrillation.  Diagnosis is presumptive currently.  Weight loss will be helpful. 5. 150 minutes of moderate aerobic activity per week  and decrease in caloric intake concentrating more on elimination of carbohydrates and increased complex vegetable components.  CBC, B MET, hemoglobin A1c, and vitamin D level.  The natural history of atrial fibrillation was discussed.  The inability to cure and the possibility of recurrences was clearly stated.  Management strategies including rhythm control (antiarrhythmic therapy), rate control (beta-blocker therapy or AV node blocking calcium channel blocker therapy), and ablation and/or pacemaker therapy were reviewed.  Stroke risk (as determined by CHADS VASC score >1) was discussed relative to the patient's individual profile.  The expected duration/permanence of anti-coagulation therapy was determined based on the individual risk score.  Coumadin versus NOAC therapy was reviewed, highlighting the lower bleeding risk and improved safety with NOAC therapy.    Greater than 50% of the time during this office visit was spent in education, counseling, and coordination of care related to underlying disease process and testing as outlined.   8 to 68-monthfollow-up  Medication Adjustments/Labs and Tests Ordered: Current medicines are reviewed at length with the patient today.  Concerns regarding medicines are outlined above.  Orders Placed This Encounter  Procedures  . CBC    . Basic metabolic panel  . HgB A1c  . Vitamin D (25 hydroxy)  . Basic metabolic panel  . CBC   Meds ordered this encounter  Medications  . diltiazem (CARDIZEM CD) 120 MG 24 hr capsule    Sig: Take 1 capsule (120 mg total) by mouth daily.    Dispense:  30 capsule    Refill:  11    Patient Instructions  Medication Instructions:  Your physician recommends that you continue on your current medications as directed. Please refer to the Current Medication list given to you today.  If you need a refill on your cardiac medications before your next appointment, please call your pharmacy.   Lab work: TODAY: CBC, BMET, A1C, Vit D  Your physician recommends that you return for lab work in: 6 months for CBC, BMET  If you have labs (blood work) drawn today and your tests are completely normal, you will receive your results only by: .Marland KitchenMyChart Message (if you have MyChart) OR . A paper copy in the mail If you have any lab test that is abnormal or we need to change your treatment, we will call you to review the results.  Testing/Procedures: None ordered  Follow-Up: [facility] and [facility] are our [facility].  As part of our continuing mission to provide you with exceptional heart care, we have created designated Provider Care Teams.  These Care Teams include your primary Cardiologist (physician) and Advanced Practice Providers (APPs -  Physician Assistants and Nurse Practitioners) who all work together to provide you with the care you need, when you need it. . You will need a follow up appointment in 8-12 months.  Please call our office 2 months in advance to schedule this appointment.  You may see HSinclair Grooms MD or one of the following Advanced Practice Providers on your designated Care Team:   . LTruitt Merle NP . LCecilie Kicks NP . JKathyrn Drown NP   Any Other Special Instructions Will Be Listed Below (If Applicable).     Signed, HSinclair Grooms MD   04/11/2018 9:03 AM    [place]

## 2018-04-11 ENCOUNTER — Encounter: Payer: Self-pay | Admitting: Interventional Cardiology

## 2018-04-11 ENCOUNTER — Ambulatory Visit (INDEPENDENT_AMBULATORY_CARE_PROVIDER_SITE_OTHER): Payer: No Typology Code available for payment source | Admitting: Interventional Cardiology

## 2018-04-11 ENCOUNTER — Telehealth: Payer: Self-pay

## 2018-04-11 VITALS — BP 118/76 | HR 68 | Ht 68.0 in | Wt 238.8 lb

## 2018-04-11 DIAGNOSIS — I480 Paroxysmal atrial fibrillation: Secondary | ICD-10-CM | POA: Diagnosis not present

## 2018-04-11 DIAGNOSIS — I10 Essential (primary) hypertension: Secondary | ICD-10-CM

## 2018-04-11 DIAGNOSIS — Z720 Tobacco use: Secondary | ICD-10-CM | POA: Diagnosis not present

## 2018-04-11 DIAGNOSIS — Z7901 Long term (current) use of anticoagulants: Secondary | ICD-10-CM

## 2018-04-11 DIAGNOSIS — G4730 Sleep apnea, unspecified: Secondary | ICD-10-CM | POA: Diagnosis not present

## 2018-04-11 MED ORDER — DILTIAZEM HCL ER COATED BEADS 120 MG PO CP24: 120.0000 mg | ORAL_CAPSULE | Freq: Every day | ORAL | 11 refills | Status: DC

## 2018-04-11 NOTE — Telephone Encounter (Signed)
The pt had OV with Dr Tamala Julian this morning and brought in the provider part of her [employer] pt asst application. She states that she has already faxed in her part of the application.  I completed the application and Truitt Merle, NP has signed it. Per the pts request I faxed it to [employer] with a note on cover letter asking them to add to the pt application that was already faxed in.

## 2018-04-11 NOTE — Patient Instructions (Addendum)
Medication Instructions:  Your physician recommends that you continue on your current medications as directed. Please refer to the Current Medication list given to you today.  If you need a refill on your cardiac medications before your next appointment, please call your pharmacy.   Lab work: TODAY: CBC, BMET, A1C, Vit D  Your physician recommends that you return for lab work in: 6 months for CBC, BMET  If you have labs (blood work) drawn today and your tests are completely normal, you will receive your results only by: Marland Kitchen MyChart Message (if you have MyChart) OR . A paper copy in the mail If you have any lab test that is abnormal or we need to change your treatment, we will call you to review the results.  Testing/Procedures: None ordered  Follow-Up: At [facility], you and your health needs are our priority.  As part of our continuing mission to provide you with exceptional heart care, we have created designated Provider Care Teams.  These Care Teams include your primary Cardiologist (physician) and Advanced Practice Providers (APPs -  Physician Assistants and Nurse Practitioners) who all work together to provide you with the care you need, when you need it. . You will need a follow up appointment in 8-12 months.  Please call our office 2 months in advance to schedule this appointment.  You may see Sinclair Grooms, MD or one of the following Advanced Practice Providers on your designated Care Team:   . Truitt Merle, NP . Cecilie Kicks, NP . [name], NP   Any Other Special Instructions Will Be Listed Below (If Applicable).

## 2018-04-12 LAB — BASIC METABOLIC PANEL
BUN / CREAT RATIO: 18 (ref 9–23)
BUN: 17 mg/dL (ref 6–24)
CO2: 23 mmol/L (ref 20–29)
CREATININE: 0.94 mg/dL (ref 0.57–1.00)
Calcium: 9.8 mg/dL (ref 8.7–10.2)
Chloride: 102 mmol/L (ref 96–106)
GFR calc non Af Amer: 68 mL/min/{1.73_m2} (ref >59)
GFR, EST AFRICAN AMERICAN: 78 mL/min/{1.73_m2} (ref >59)
GLUCOSE: 86 mg/dL (ref 65–99)
Potassium: 5 mmol/L (ref 3.5–5.2)
SODIUM: 140 mmol/L (ref 134–144)

## 2018-04-12 LAB — CBC
HEMOGLOBIN: 14.4 g/dL (ref 11.1–15.9)
Hematocrit: 44.7 % (ref 34.0–46.6)
MCH: 28.1 pg (ref 26.6–33.0)
MCHC: 32.2 g/dL (ref 31.5–35.7)
MCV: 87 fL (ref 79–97)
Platelets: 246 10*3/uL (ref 150–450)
RBC: 5.13 x10E6/uL (ref 3.77–5.28)
RDW: 13.9 % (ref 12.3–15.4)
WBC: 8.5 10*3/uL (ref 3.4–10.8)

## 2018-04-12 LAB — VITAMIN D 25 HYDROXY (VIT D DEFICIENCY, FRACTURES): Vit D, 25-Hydroxy: 26.1 ng/mL — ABNORMAL LOW (ref 30.0–100.0)

## 2018-04-12 LAB — HEMOGLOBIN A1C
Est. average glucose Bld gHb Est-mCnc: 114 mg/dL
HEMOGLOBIN A1C: 5.6 % (ref 4.8–5.6)

## 2018-06-21 NOTE — Telephone Encounter (Signed)
**Note De-Identified Tammy Davies Obfuscation** We received a letter Kaede Clendenen fax from [place] stating that they need the pts insurance info. I have printed and faxed the insurance info ([employer]) that we have on file for the pt back to [employer].

## 2018-07-19 NOTE — Telephone Encounter (Signed)
We received a letter via fax from [place] stating that they have approved the pt for pt asst with Eliquis. Approval good from 07/15/2018 until 05/14/2020.  Application case#: [id number]

## 2018-10-11 ENCOUNTER — Other Ambulatory Visit: Payer: No Typology Code available for payment source

## 2019-03-26 NOTE — Progress Notes (Signed)
Cardiology Office Note:    Date:  03/27/2019   ID:  Tammy Davies, DOB 10/23/1960, MRN [id number]  PCP:  Patient, No Pcp Per  Cardiologist:  No primary care provider on file.   Referring MD: No ref. provider found   Chief Complaint  Patient presents with  . Atrial Fibrillation    History of Present Illness:    Tammy Davies is a 58 y.o. female with a hx of  PAF, prediabetes, anticoagulation, CHADS VASC ~ 1,  and hypertension.  She has rare episodes of palpitation.  No prolonged episodes.  No medication side effects.  Specifically no bleeding on Xarelto.  Tolerating diltiazem without difficulty.   Past Medical History:  Diagnosis Date  . Dysrhythmia   . PAF (paroxysmal atrial fibrillation) ([place])   . Tobacco abuse     Past Surgical History:  Procedure Laterality Date  . ABDOMINAL HYSTERECTOMY    . CHOLECYSTECTOMY N/A 08/18/2016   Procedure: LAPAROSCOPIC CHOLECYSTECTOMY WITH  INTRAOPERATIVE CHOLANGIOGRAM;  Surgeon: Johnathan Hausen, MD;  Location: [facility];  Service: General;  Laterality: N/A;    Current Medications: Current Meds  Medication Sig  . apixaban (ELIQUIS) 5 MG TABS tablet Take 1 tablet (5 mg total) by mouth 2 (two) times daily.  Marland Kitchen diltiazem (CARDIZEM CD) 120 MG 24 hr capsule Take 1 capsule (120 mg total) by mouth daily.  . Multiple Vitamin (MULTIVITAMIN) capsule Take 1 capsule by mouth daily.     Allergies:   Patient has no known allergies.   Social History   Socioeconomic History  . Marital status: Single    Spouse name: Not on file  . Number of children: Not on file  . Years of education: Not on file  . Highest education level: Not on file  Occupational History  . Not on file  Social Needs  . Financial resource strain: Not on file  . Food insecurity    Worry: Not on file    Inability: Not on file  . Transportation needs    Medical: Not on file    Non-medical: Not on file  Tobacco Use  . Smoking status: Current Every Day Smoker   Types: Cigarettes  . Smokeless tobacco: Never Used  Substance and Sexual Activity  . Alcohol use: No  . Drug use: Yes    Types: Marijuana  . Sexual activity: Never  Lifestyle  . Physical activity    Days per week: Not on file    Minutes per session: Not on file  . Stress: Not on file  Relationships  . Social Herbalist on phone: Not on file    Gets together: Not on file    Attends religious service: Not on file    Active member of club or organization: Not on file    Attends meetings of clubs or organizations: Not on file    Relationship status: Not on file  Other Topics Concern  . Not on file  Social History Narrative  . Not on file     Family History: The patient's family history includes Breast cancer in her mother; Diabetes in her father; Heart failure in her father and mother; Microcephaly in her father.  ROS:   Please see the history of present illness.    Having dental issues.  Will have multiple extractions soon.  All other systems reviewed and are negative.  EKGs/Labs/Other Studies Reviewed:    The following studies were reviewed today: No new data  EKG:  EKG normal  sinus rhythm with left anterior hemiblock and left ventricular hypertrophy.  Recent Labs: 04/11/2018: BUN 17; Creatinine, Ser 0.94; Hemoglobin 14.4; Platelets 246; Potassium 5.0; Sodium 140  Recent Lipid Panel    Component Value Date/Time   CHOL 173 06/08/2017 0819   TRIG 116 06/08/2017 0819   HDL 40 (L) 06/08/2017 0819   CHOLHDL 4.3 06/08/2017 0819   VLDL 23 06/08/2017 0819   LDLCALC 110 (H) 06/08/2017 0819    Physical Exam:    VS:  BP 122/86   Pulse 84   Ht 5' 8"  (1.727 m)   Wt 246 lb 12.8 oz (111.9 kg)   SpO2 97%   BMI 37.53 kg/m     Wt Readings from Last 3 Encounters:  03/27/19 246 lb 12.8 oz (111.9 kg)  04/11/18 238 lb 12.8 oz (108.3 kg)  10/11/17 230 lb (104.3 kg)     GEN: Moderate obesity.. No acute distress HEENT: Normal NECK: No JVD. LYMPHATICS: No  lymphadenopathy CARDIAC:  RRR without murmur, gallop, or edema. VASCULAR:  Normal Pulses. No bruits. RESPIRATORY:  Clear to auscultation without rales, wheezing or rhonchi  ABDOMEN: Soft, non-tender, non-distended, No pulsatile mass, MUSCULOSKELETAL: No deformity  SKIN: Warm and dry NEUROLOGIC:  Alert and oriented x 3 PSYCHIATRIC:  Normal affect   ASSESSMENT:    1. AF (paroxysmal atrial fibrillation) (H[facility])   2. Tobacco abuse   3. Essential hypertension   4. Sleep apnea, unspecified type   5. Morbid obesity ([place])   6. On continuous oral anticoagulation   7. Educated about COVID-19 virus infection    PLAN:    In order of problems listed above:  1. Maintaining sinus rhythm 2. Encouraged smoking cessation. 3. Excellent blood pressure control 4. Compliant with CPAP 5. Encouraged reduction and dietary carbohydrates 6. Continue Eliquis.  Okay to pause Eliquis for the teeth extractions 7. Social distancing, handwashing, and facemask wearing.  Clinical follow-up in 1 year.   Medication Adjustments/Labs and Tests Ordered: Current medicines are reviewed at length with the patient today.  Concerns regarding medicines are outlined above.  Orders Placed This Encounter  Procedures  . EKG 12-Lead   No orders of the defined types were placed in this encounter.   Patient Instructions  Medication Instructions:  Your physician recommends that you continue on your current medications as directed. Please refer to the Current Medication list given to you today.  *If you need a refill on your cardiac medications before your next appointment, please call your pharmacy*  Lab Work: None If you have labs (blood work) drawn today and your tests are completely normal, you will receive your results only by: Marland Kitchen MyChart Message (if you have MyChart) OR . A paper copy in the mail If you have any lab test that is abnormal or we need to change your treatment, we will call you to review the results.   Testing/Procedures: None  Follow-Up: At [facility], you and your health needs are our priority.  As part of our continuing mission to provide you with exceptional heart care, we have created designated Provider Care Teams.  These Care Teams include your primary Cardiologist (physician) and Advanced Practice Providers (APPs -  Physician Assistants and Nurse Practitioners) who all work together to provide you with the care you need, when you need it.  Your next appointment:   12 months  The format for your next appointment:   In Person  Provider:   You may see Dr. Daneen Schick or one of the following  Advanced Practice Providers on your designated Care Team:    Truitt Merle, NP  Cecilie Kicks, NP  [name], NP   Other Instructions      Signed, Sinclair Grooms, MD  03/27/2019 8:45 AM    [place]

## 2019-03-27 ENCOUNTER — Encounter: Payer: Self-pay | Admitting: Interventional Cardiology

## 2019-03-27 ENCOUNTER — Ambulatory Visit (INDEPENDENT_AMBULATORY_CARE_PROVIDER_SITE_OTHER): Payer: No Typology Code available for payment source | Admitting: Interventional Cardiology

## 2019-03-27 ENCOUNTER — Other Ambulatory Visit: Payer: Self-pay

## 2019-03-27 VITALS — BP 122/86 | HR 84 | Ht 68.0 in | Wt 246.8 lb

## 2019-03-27 DIAGNOSIS — I480 Paroxysmal atrial fibrillation: Secondary | ICD-10-CM

## 2019-03-27 DIAGNOSIS — G4730 Sleep apnea, unspecified: Secondary | ICD-10-CM

## 2019-03-27 DIAGNOSIS — Z7189 Other specified counseling: Secondary | ICD-10-CM

## 2019-03-27 DIAGNOSIS — Z7901 Long term (current) use of anticoagulants: Secondary | ICD-10-CM

## 2019-03-27 DIAGNOSIS — I10 Essential (primary) hypertension: Secondary | ICD-10-CM | POA: Diagnosis not present

## 2019-03-27 DIAGNOSIS — Z720 Tobacco use: Secondary | ICD-10-CM

## 2019-03-27 NOTE — Patient Instructions (Signed)
Medication Instructions:  Your physician recommends that you continue on your current medications as directed. Please refer to the Current Medication list given to you today.  *If you need a refill on your cardiac medications before your next appointment, please call your pharmacy*  Lab Work: None If you have labs (blood work) drawn today and your tests are completely normal, you will receive your results only by: . MyChart Message (if you have MyChart) OR . A paper copy in the mail If you have any lab test that is abnormal or we need to change your treatment, we will call you to review the results.  Testing/Procedures: None  Follow-Up: At [facility], you and your health needs are our priority.  As part of our continuing mission to provide you with exceptional heart care, we have created designated Provider Care Teams.  These Care Teams include your primary Cardiologist (physician) and Advanced Practice Providers (APPs -  Physician Assistants and Nurse Practitioners) who all work together to provide you with the care you need, when you need it.  Your next appointment:   12 month(s)  The format for your next appointment:   In Person  Provider:   You may see Dr. Henry Smith or one of the following Advanced Practice Providers on your designated Care Team:    Lori Gerhardt, NP  Laura Ingold, NP  Jill McDaniel, NP   Other Instructions   

## 2019-06-26 ENCOUNTER — Other Ambulatory Visit: Payer: Self-pay

## 2019-06-26 ENCOUNTER — Other Ambulatory Visit: Payer: Self-pay | Admitting: Interventional Cardiology

## 2019-06-26 MED ORDER — DILTIAZEM HCL ER COATED BEADS 120 MG PO CP24: 120.0000 mg | ORAL_CAPSULE | Freq: Every day | ORAL | 2 refills | Status: DC

## 2019-06-27 ENCOUNTER — Other Ambulatory Visit: Payer: Self-pay | Admitting: Nurse Practitioner

## 2019-06-27 DIAGNOSIS — I480 Paroxysmal atrial fibrillation: Secondary | ICD-10-CM

## 2019-06-27 NOTE — Telephone Encounter (Signed)
Eliquis 44m refill request received, pt is 59yrold, weight-111.9kg, Crea-0.94 on 04/11/2018-NEEDS LABS, Diagnosis-Afib, and last seen by Dr. SmTamala Juliann 03/27/2019. Dose is appropriate based on dosing criteria bu pt needs labs.  Called pt since she is overdue for labs and had to leave a message instructing her to callback regarding a refill request we have received at Dr. SmThompson Caulffice.

## 2019-07-04 ENCOUNTER — Encounter: Payer: Self-pay | Admitting: Pharmacist

## 2019-07-04 NOTE — Progress Notes (Signed)
This encounter was created in error - please disregard.

## 2019-07-07 ENCOUNTER — Telehealth: Payer: Self-pay | Admitting: *Deleted

## 2019-07-07 NOTE — Telephone Encounter (Signed)
Prescription refill request for Eliquis received from [facility].   Last office visit: Tammy Davies 03/27/2019 Scr: 1.04, 04/11/2018 Age: 59 yo Weight: 111.9 kg   Pt is overdue for labs. Called pt and LMOM.

## 2019-07-11 NOTE — Telephone Encounter (Signed)
Called pt as she is requested an Eliquis refill and she needs labwork. She has not had any labwork (CBC & BMET) done since 2019 and we need annually at least. Had to leave another message for the pt to call back to our clinic to schedule a lab appt and left the direct number as well.

## 2019-07-11 NOTE — Telephone Encounter (Signed)
Pt has a refill encounter open in refills. Waiting for pt to call back. Will close this encounter. For further documentation see refill encounter from 06/27/2019.

## 2019-07-18 NOTE — Telephone Encounter (Signed)
Called and spoke to pt and informed her that she would need to come in for blood work to make sure she is on the correct dose of Eliquis. Scheduled pt to get blood work on 07/19/2019. Pt stated she has about a month supply left of Eliquis.

## 2019-07-19 ENCOUNTER — Other Ambulatory Visit: Payer: Self-pay

## 2019-07-19 ENCOUNTER — Other Ambulatory Visit: Payer: No Typology Code available for payment source | Admitting: *Deleted

## 2019-07-19 DIAGNOSIS — I480 Paroxysmal atrial fibrillation: Secondary | ICD-10-CM

## 2019-07-19 LAB — CBC
Hematocrit: 41.1 % (ref 34.0–46.6)
Hemoglobin: 13.6 g/dL (ref 11.1–15.9)
MCH: 28 pg (ref 26.6–33.0)
MCHC: 33.1 g/dL (ref 31.5–35.7)
MCV: 85 fL (ref 79–97)
Platelets: 248 10*3/uL (ref 150–450)
RBC: 4.86 x10E6/uL (ref 3.77–5.28)
RDW: 14.3 % (ref 11.7–15.4)
WBC: 7.6 10*3/uL (ref 3.4–10.8)

## 2019-07-19 LAB — BASIC METABOLIC PANEL
BUN/Creatinine Ratio: 18 (ref 9–23)
BUN: 13 mg/dL (ref 6–24)
CO2: 21 mmol/L (ref 20–29)
Calcium: 9.1 mg/dL (ref 8.7–10.2)
Chloride: 106 mmol/L (ref 96–106)
Creatinine, Ser: 0.73 mg/dL (ref 0.57–1.00)
GFR calc Af Amer: 105 mL/min/{1.73_m2} (ref >59)
GFR calc non Af Amer: 91 mL/min/{1.73_m2} (ref >59)
Glucose: 88 mg/dL (ref 65–99)
Potassium: 4.1 mmol/L (ref 3.5–5.2)
Sodium: 141 mmol/L (ref 134–144)

## 2019-07-20 NOTE — Telephone Encounter (Signed)
Prescription refill request for Eliquis received.  Last office visit: 11/19/2020[name] Scr: 0.73, 07/19/2019 Age: 59 y.o. Weight: 111.9 kg   Prescription refill sent.

## 2019-09-01 ENCOUNTER — Telehealth: Payer: Self-pay | Admitting: Interventional Cardiology

## 2019-09-01 NOTE — Telephone Encounter (Signed)
Pt dropped off paperwork for [employer] patient assistance for Eliquis.  Form completed and signed by Dr. Tamala Julian.  Faxed to [employer].

## 2019-09-12 ENCOUNTER — Other Ambulatory Visit: Payer: Self-pay | Admitting: *Deleted

## 2019-09-12 MED ORDER — APIXABAN 5 MG PO TABS: 5.0000 mg | ORAL_TABLET | Freq: Two times a day (BID) | ORAL | 1 refills | Status: DC

## 2019-09-18 NOTE — Telephone Encounter (Signed)
**Note De-Identified  Obfuscation** Letter received from [employer] stating that they approved the pt for asst with her Eliquis. Approved until [id number] Application Case: [id number]  The letter states that they have notified the pt of this approval as well.

## 2020-02-26 ENCOUNTER — Other Ambulatory Visit: Payer: Self-pay | Admitting: *Deleted

## 2020-02-26 MED ORDER — APIXABAN 5 MG PO TABS: 5.0000 mg | ORAL_TABLET | Freq: Two times a day (BID) | ORAL | 1 refills | Status: DC

## 2020-02-26 NOTE — Telephone Encounter (Signed)
Eliquis 59m paper refill request received. Patient is 59years old, weight-111.9kg, Crea-0.73 on 07/19/2019, Diagnosis-Afib, and last seen by Dr. STamala Julianon 03/27/2019. Dose is appropriate based on dosing criteria. Will send in refill to requested pharmacy.

## 2020-05-09 ENCOUNTER — Encounter (HOSPITAL_COMMUNITY): Payer: Self-pay

## 2020-05-09 ENCOUNTER — Other Ambulatory Visit: Payer: Self-pay

## 2020-05-09 ENCOUNTER — Emergency Department (HOSPITAL_COMMUNITY): Payer: Self-pay

## 2020-05-09 ENCOUNTER — Emergency Department (HOSPITAL_COMMUNITY)
Admission: EM | Admit: 2020-05-09 | Discharge: 2020-05-09 | Disposition: A | Discharge status: Home / Self Care | Payer: Self-pay | Attending: Emergency Medicine | Admitting: Emergency Medicine

## 2020-05-09 DIAGNOSIS — F17210 Nicotine dependence, cigarettes, uncomplicated: Secondary | ICD-10-CM | POA: Insufficient documentation

## 2020-05-09 DIAGNOSIS — Z7901 Long term (current) use of anticoagulants: Secondary | ICD-10-CM | POA: Insufficient documentation

## 2020-05-09 DIAGNOSIS — I4891 Unspecified atrial fibrillation: Secondary | ICD-10-CM | POA: Insufficient documentation

## 2020-05-09 DIAGNOSIS — K5900 Constipation, unspecified: Secondary | ICD-10-CM | POA: Insufficient documentation

## 2020-05-09 MED ORDER — DICYCLOMINE HCL 20 MG PO TABS
20.0000 mg | ORAL_TABLET | Freq: Three times a day (TID) | ORAL | 0 refills | Status: DC | PRN
Start: 2020-05-09 — End: 2020-07-03

## 2020-05-09 MED ORDER — GOLYTELY 236 G PO SOLR: 4000.0000 mL | Freq: Once | ORAL | 0 refills | Status: AC

## 2020-05-09 MED ORDER — ONDANSETRON 4 MG PO TBDP
4.0000 mg | ORAL_TABLET | Freq: Three times a day (TID) | ORAL | 0 refills | Status: DC | PRN
Start: 2020-05-09 — End: 2020-07-03

## 2020-05-09 MED ORDER — DICYCLOMINE HCL 10 MG/ML IM SOLN
20.0000 mg | Freq: Once | INTRAMUSCULAR | Status: AC
  Administered 2020-05-09: 20 mg via INTRAMUSCULAR
  Filled 2020-05-09: qty 2

## 2020-05-09 NOTE — ED Triage Notes (Signed)
Pt sts issues with constipation for 3 weeks. Increased water and fiber. No BM for 3 days. Used mag citrate and got slight relief.

## 2020-05-09 NOTE — Discharge Instructions (Signed)
You may alternate Tylenol 1000 mg every 6 hours as needed for pain, fever and Ibuprofen 800 mg every 8 hours as needed for pain, fever.  Please take Ibuprofen with food.  Do not take more than 4000 mg of Tylenol (acetaminophen) in a 24 hour period.  I recommend that you increase your water and fiber intake. If you are not able to eat foods high in fiber, you may use Benefiber or Metamucil over-the-counter.  We are giving you a medication called GoLYTELY to help with your constipation.  If this is not helping with your bowel movements, I recommend close follow-up with your primary care doctor.  You may use other over-the-counter medications such as Dulcolax, Fleet enemas, magnesium citrate as needed for constipation. Please note that some of these medications may cause you to have abdominal cramping which is normal.   If you develop severe abdominal pain, fever (temperature of 100.4 or higher), persistent vomiting, distention of your abdomen, unable to have a bowel movement for 5 days or are not passing gas, please return to the hospital.

## 2020-05-09 NOTE — ED Provider Notes (Signed)
TIME SEEN: 4:40 AM  CHIEF COMPLAINT: Constipation  HPI: Patient is a 59 year old female who presents to the emergency department with constipation for the past few weeks.  Has not had a bowel movement in 3 days.  Having abdominal cramping.  No fever.  No vomiting.  Passing gas.  No history of bowel obstruction.  No history of previous abdominal surgery.  Has tried some over-the-counter medications intermittently without much relief.  Not on chronic narcotics.  Has noticed a small amount of blood with wiping.  No melena.  ROS: See HPI Constitutional: no fever  Eyes: no drainage  ENT: no runny nose   Cardiovascular:  no chest pain  Resp: no SOB  GI: no vomiting GU: no dysuria Integumentary: no rash  Allergy: no hives  Musculoskeletal: no leg swelling  Neurological: no slurred speech ROS otherwise negative  PAST MEDICAL HISTORY/PAST SURGICAL HISTORY:  Past Medical History:  Diagnosis Date  . Dysrhythmia   . PAF (paroxysmal atrial fibrillation) ([place])   . Tobacco abuse     MEDICATIONS:  Prior to Admission medications   Medication Sig Start Date End Date Taking? Authorizing Provider  apixaban (ELIQUIS) 5 MG TABS tablet Take 1 tablet (5 mg total) by mouth 2 (two) times daily. 02/26/20   Belva Crome, MD  diltiazem (CARDIZEM CD) 120 MG 24 hr capsule Take 1 capsule (120 mg total) by mouth daily. 06/26/19   Belva Crome, MD  Multiple Vitamin (MULTIVITAMIN) capsule Take 1 capsule by mouth daily.    [provider]    ALLERGIES:  No Known Allergies  SOCIAL HISTORY:  Social History   Tobacco Use  . Smoking status: Current Every Day Smoker    Types: Cigarettes  . Smokeless tobacco: Never Used  Substance Use Topics  . Alcohol use: No    FAMILY HISTORY: Family History  Problem Relation Age of Onset  . Heart failure Mother   . Breast cancer Mother   . Heart failure Father   . Diabetes Father   . Microcephaly Father     EXAM: BP (!) 140/91 (BP Location: Left Arm)    Pulse 99   Temp 99 F (37.2 C) (Oral)   Resp 18   Ht 5' 8"  (1.727 m)   Wt 111.1 kg   SpO2 94%   BMI 37.25 kg/m  CONSTITUTIONAL: Alert and oriented and responds appropriately to questions. Well-appearing; well-nourished HEAD: Normocephalic EYES: Conjunctivae clear, pupils appear equal, EOM appear intact ENT: normal nose; moist mucous membranes NECK: Supple, normal ROM CARD: RRR; S1 and S2 appreciated; no murmurs, no clicks, no rubs, no gallops RESP: Normal chest excursion without splinting or tachypnea; breath sounds clear and equal bilaterally; no wheezes, no rhonchi, no rales, no hypoxia or respiratory distress, speaking full sentences ABD/GI: Normal bowel sounds; non-distended; soft, non-tender, no rebound, no guarding, no peritoneal signs, no hepatosplenomegaly RECTAL:  Normal rectal tone, minimal amount of blood on rectal exam without hemorrhage, no melena, large amount of hard stool just above the rectal vault that I am not able to reach for disimpaction BACK:  The back appears normal EXT: Normal ROM in all joints; no deformity noted, no edema; no cyanosis SKIN: Normal color for age and race; warm; no rash on exposed skin NEURO: Moves all extremities equally PSYCH: The patient's mood and manner are appropriate.   MEDICAL DECISION MAKING: Patient here with constipation.  She does not have a fecal impaction on exam but I do feel hard stool just above her rectal  vault.  I am unable to disimpact her here.  Given soapsuds enema with minimal relief.  Recommended GoLYTELY.  Provided with prescription for GoLYTELY.  Recommended increase fluid intake and high-fiber diet.  She has tried some medications intermittently over-the-counter.  Abdominal exam benign.  Doubt bowel obstruction.  X-ray shows no free air or obstructive gas pattern.  Discussed return precautions.  I feel she is safe for discharge home.    She does have a small amount of rectal blood on exam but no hemorrhage.  She is  hemodynamically stable.  I think this is due to her constipation.  The stool that I was able to obtain on rectal exam appears brown without blood mixed in it.  At this time, I do not feel there is any life-threatening condition present. I have reviewed, interpreted and discussed all results (EKG, imaging, lab, urine as appropriate) and exam findings with patient/family. I have reviewed nursing notes and appropriate previous records.  I feel the patient is safe to be discharged home without further emergent workup and can continue workup as an outpatient as needed. Discussed usual and customary return precautions. Patient/family verbalize understanding and are comfortable with this plan.  Outpatient follow-up has been provided as needed. All questions have been answered.   Tammy Davies was evaluated in Emergency Department on 05/09/2020 for the symptoms described in the history of present illness. She was evaluated in the context of the global COVID-19 pandemic, which necessitated consideration that the patient might be at risk for infection with the SARS-CoV-2 virus that causes COVID-19. Institutional protocols and algorithms that pertain to the evaluation of patients at risk for COVID-19 are in a state of rapid change based on information released by regulatory bodies including the CDC and federal and state organizations. These policies and algorithms were followed during the patient's care in the ED.      Tammy Davies, [name], DO 05/09/20 [phone]

## 2020-05-29 ENCOUNTER — Emergency Department (HOSPITAL_COMMUNITY)
Admission: EM | Admit: 2020-05-29 | Discharge: 2020-05-29 | Disposition: A | Discharge status: Home / Self Care | Payer: No Typology Code available for payment source | Attending: Emergency Medicine | Admitting: Emergency Medicine

## 2020-05-29 ENCOUNTER — Other Ambulatory Visit: Payer: Self-pay

## 2020-05-29 ENCOUNTER — Emergency Department (HOSPITAL_COMMUNITY): Payer: No Typology Code available for payment source

## 2020-05-29 ENCOUNTER — Encounter (HOSPITAL_COMMUNITY): Payer: Self-pay

## 2020-05-29 DIAGNOSIS — Y829 Unspecified medical devices associated with adverse incidents: Secondary | ICD-10-CM | POA: Insufficient documentation

## 2020-05-29 DIAGNOSIS — Z7901 Long term (current) use of anticoagulants: Secondary | ICD-10-CM | POA: Diagnosis not present

## 2020-05-29 DIAGNOSIS — F17210 Nicotine dependence, cigarettes, uncomplicated: Secondary | ICD-10-CM | POA: Diagnosis not present

## 2020-05-29 DIAGNOSIS — R1032 Left lower quadrant pain: Secondary | ICD-10-CM | POA: Diagnosis present

## 2020-05-29 DIAGNOSIS — T80818A Extravasation of other vesicant agent, initial encounter: Secondary | ICD-10-CM | POA: Diagnosis not present

## 2020-05-29 DIAGNOSIS — I10 Essential (primary) hypertension: Secondary | ICD-10-CM | POA: Insufficient documentation

## 2020-05-29 DIAGNOSIS — K5732 Diverticulitis of large intestine without perforation or abscess without bleeding: Secondary | ICD-10-CM | POA: Diagnosis not present

## 2020-05-29 LAB — URINALYSIS, ROUTINE W REFLEX MICROSCOPIC
Bilirubin Urine: NEGATIVE
Glucose, UA: NEGATIVE mg/dL
Hgb urine dipstick: NEGATIVE
Ketones, ur: 5 mg/dL — AB
Leukocytes,Ua: NEGATIVE
Nitrite: NEGATIVE
Protein, ur: 30 mg/dL — AB
Specific Gravity, Urine: 1.028 (ref 1.005–1.030)
pH: 5 (ref 5.0–8.0)

## 2020-05-29 LAB — LIPASE, BLOOD: Lipase: 25 U/L (ref 11–51)

## 2020-05-29 LAB — COMPREHENSIVE METABOLIC PANEL
ALT: 17 U/L (ref 0–44)
AST: 21 U/L (ref 15–41)
Albumin: 3.8 g/dL (ref 3.5–5.0)
Alkaline Phosphatase: 117 U/L (ref 38–126)
Anion gap: 12 (ref 5–15)
BUN: 14 mg/dL (ref 6–20)
CO2: 23 mmol/L (ref 22–32)
Calcium: 9.1 mg/dL (ref 8.9–10.3)
Chloride: 103 mmol/L (ref 98–111)
Creatinine, Ser: 1.26 mg/dL — ABNORMAL HIGH (ref 0.44–1.00)
GFR, Estimated: 49 mL/min — ABNORMAL LOW (ref >60)
Glucose, Bld: 112 mg/dL — ABNORMAL HIGH (ref 70–99)
Potassium: 4.6 mmol/L (ref 3.5–5.1)
Sodium: 138 mmol/L (ref 135–145)
Total Bilirubin: 0.4 mg/dL (ref 0.3–1.2)
Total Protein: 7.5 g/dL (ref 6.5–8.1)

## 2020-05-29 LAB — CBC
HCT: 40.4 % (ref 36.0–46.0)
Hemoglobin: 12.8 g/dL (ref 12.0–15.0)
MCH: 27.4 pg (ref 26.0–34.0)
MCHC: 31.7 g/dL (ref 30.0–36.0)
MCV: 86.3 fL (ref 80.0–100.0)
Platelets: 374 10*3/uL (ref 150–400)
RBC: 4.68 MIL/uL (ref 3.87–5.11)
RDW: 15.3 % (ref 11.5–15.5)
WBC: 8.1 10*3/uL (ref 4.0–10.5)
nRBC: 0 % (ref 0.0–0.2)

## 2020-05-29 MED ORDER — IOHEXOL 300 MG/ML  SOLN
100.0000 mL | Freq: Once | INTRAMUSCULAR | Status: AC | PRN
  Administered 2020-05-29: 80 mL via INTRAVENOUS

## 2020-05-29 MED ORDER — FENTANYL CITRATE (PF) 100 MCG/2ML IJ SOLN
50.0000 ug | Freq: Once | INTRAMUSCULAR | Status: AC
  Administered 2020-05-29: 50 ug via INTRAVENOUS
  Filled 2020-05-29: qty 2

## 2020-05-29 MED ORDER — AMOXICILLIN-POT CLAVULANATE 875-125 MG PO TABS: 1.0000 | ORAL_TABLET | Freq: Two times a day (BID) | ORAL | 0 refills | Status: AC

## 2020-05-29 NOTE — ED Notes (Signed)
Pt verbalized dc instructions and follow up care. Alert and ambulatory. No iv. 

## 2020-05-29 NOTE — Discharge Instructions (Signed)
Please follow the form from the radiology department regarding instructions, recommendations and return precautions related to the contrast extravasation.  In short if you have any significant increase in pain, swelling, you should come back for reassessment.  Otherwise recommend ice, elevation of arm.    For your diverticulitis, please take the antibiotic as prescribed.  If you develop worsening pain, fever, vomiting return to ER for reassessment otherwise follow-up with your primary.

## 2020-05-29 NOTE — ED Triage Notes (Signed)
Pt arrived via walk in, LLQ abd pain, states intermittently radiates across to right side as well. States this has been going on, she was told she was constipated, given rx, states it has helped, but pain is still present.

## 2020-05-29 NOTE — ED Provider Notes (Signed)
[facility] DEPT Provider Note   CSN: [id number] Arrival date & time: 05/29/20  3154     History No chief complaint on file.   Tammy Davies is a 60 y.o. female.  Comes to ER with concern for left lower quadrant abdominal pain.  States that this has been going on for the past couple weeks but seem to be worsened more recently.  Has been associated with constipation.  Has had occasional streak of blood in her stool but has had limited stools.  None today.  Passing gas.  No nausea or vomiting.  History of atrial fibrillation on Eliquis  HPI     Past Medical History:  Diagnosis Date  . Dysrhythmia   . PAF (paroxysmal atrial fibrillation) ([place])   . Tobacco abuse     Patient Active Problem List   Diagnosis Date Noted  . Elevated glucose 06/08/2017  . Hypertension 06/08/2017  . S/P laparoscopic cholecystectomy 08/20/2016  . Tobacco abuse 08/20/2016  . AF (paroxysmal atrial fibrillation) ([place]) 08/20/2016  . Pancreatitis 08/15/2016  . Liver mass, 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    Past Surgical History:  Procedure Laterality Date  . ABDOMINAL HYSTERECTOMY    . CHOLECYSTECTOMY N/A 08/18/2016   Procedure: LAPAROSCOPIC CHOLECYSTECTOMY WITH  INTRAOPERATIVE CHOLANGIOGRAM;  Surgeon: Johnathan Hausen, MD;  Location: [facility];  Service: General;  Laterality: N/A;     OB History   No obstetric history on file.     Family History  Problem Relation Age of Onset  . Heart failure Mother   . Breast cancer Mother   . Heart failure Father   . Diabetes Father   . Microcephaly Father     Social History   Tobacco Use  . Smoking status: Current Every Day Smoker    Types: Cigarettes  . Smokeless tobacco: Never Used  Vaping Use  . Vaping Use: Never used  Substance Use Topics  . Alcohol use: No  . Drug use: Yes    Types: Marijuana    Home Medications Prior to Admission medications   Medication Sig Start Date End Date Taking? Authorizing Provider   apixaban (ELIQUIS) 5 MG TABS tablet Take 1 tablet (5 mg total) by mouth 2 (two) times daily. 02/26/20   Belva Crome, MD  dicyclomine (BENTYL) 20 MG tablet Take 1 tablet (20 mg total) by mouth every 8 (eight) hours as needed for spasms (Abdominal cramping). 05/09/20   Ward, [name], DO  diltiazem (CARDIZEM CD) 120 MG 24 hr capsule Take 1 capsule (120 mg total) by mouth daily. 06/26/19   Belva Crome, MD  Multiple Vitamin (MULTIVITAMIN) capsule Take 1 capsule by mouth daily.    [provider]  ondansetron (ZOFRAN ODT) 4 MG disintegrating tablet Take 1 tablet (4 mg total) by mouth every 8 (eight) hours as needed for nausea or vomiting. 05/09/20   Ward, [name], DO    Allergies    Patient has no known allergies.  Review of Systems   Review of Systems  Constitutional: Negative for chills and fever.  HENT: Negative for ear pain and sore throat.   Eyes: Negative for pain and visual disturbance.  Respiratory: Negative for cough and shortness of breath.   Cardiovascular: Negative for chest pain and palpitations.  Gastrointestinal: Positive for abdominal pain and constipation. Negative for vomiting.  Genitourinary: Negative for dysuria and hematuria.  Musculoskeletal: Negative for arthralgias and back pain.  Skin: Negative for color change and rash.  Neurological: Negative for seizures and  syncope.  All other systems reviewed and are negative.   Physical Exam Updated Vital Signs BP 118/70 (BP Location: Right Arm)   Pulse (!) 58   Temp 98.4 F (36.9 C) (Oral)   Resp 20   SpO2 95%   Physical Exam Vitals and nursing note reviewed.  Constitutional:      General: She is not in acute distress.    Appearance: She is well-developed and well-nourished.  HENT:     Head: Normocephalic and atraumatic.  Eyes:     Conjunctiva/sclera: Conjunctivae normal.  Cardiovascular:     Rate and Rhythm: Normal rate and regular rhythm.     Heart sounds: No murmur heard.   Pulmonary:      Effort: Pulmonary effort is normal. No respiratory distress.     Breath sounds: Normal breath sounds.  Abdominal:     Palpations: Abdomen is soft.     Tenderness: There is abdominal tenderness. There is no guarding or rebound.     Comments: Tenderness in left lower quadrant, no rebound or guarding  Musculoskeletal:        General: No edema.     Cervical back: Neck supple.  Skin:    General: Skin is warm and dry.  Neurological:     General: No focal deficit present.     Mental Status: She is alert and oriented to person, place, and time.  Psychiatric:        Mood and Affect: Mood and affect and mood normal.        Behavior: Behavior normal.     ED Results / Procedures / Treatments   Labs (all labs ordered are listed, but only abnormal results are displayed) Labs Reviewed  COMPREHENSIVE METABOLIC PANEL - Abnormal; Notable for the following components:      Result Value   Glucose, Bld 112 (*)    Creatinine, Ser 1.26 (*)    GFR, Estimated 49 (*)    All other components within normal limits  URINALYSIS, ROUTINE W REFLEX MICROSCOPIC - Abnormal; Notable for the following components:   Color, Urine AMBER (*)    APPearance HAZY (*)    Ketones, ur 5 (*)    Protein, ur 30 (*)    Bacteria, UA RARE (*)    All other components within normal limits  LIPASE, BLOOD  CBC    EKG None  Radiology CT ABDOMEN PELVIS W CONTRAST  Result Date: 05/29/2020 CLINICAL DATA:  Constipation for 1 month with left lower quadrant abdominal pain. Diverticulitis suspected. EXAM: CT ABDOMEN AND PELVIS WITH CONTRAST TECHNIQUE: Multidetector CT imaging of the abdomen and pelvis was performed using the standard protocol following bolus administration of intravenous contrast. CONTRAST:  41m OMNIPAQUE IOHEXOL 300 MG/ML  SOLN COMPARISON:  Abdominal MRI 08/15/2016. FINDINGS: Lower chest: Clear lung bases. No significant pleural or pericardial effusion. Hepatobiliary: Mass involving segments 6 and 7 previously  characterized as a hemangioma on MRI is unchanged in size, measuring 8.3 x 6.6 cm on image 28/3. There is focal fat superior to the cholecystectomy bed. No new or enlarging hepatic lesions. No significant biliary dilatation post cholecystectomy. Pancreas: Unremarkable. No pancreatic ductal dilatation or surrounding inflammatory changes. Spleen: Normal in size without focal abnormality. Adrenals/Urinary Tract: Both adrenal glands appear normal. The kidneys appear normal without evidence of urinary tract calculus, suspicious lesion or hydronephrosis. No bladder abnormalities are seen. Stomach/Bowel: No enteric contrast was administered. The stomach and small bowel appear normal aside from a small proximal duodenal diverticulum. The appendix and proximal  colon appear normal. There is long segment wall thickening of the sigmoid colon associated with diverticulosis and surrounding inflammatory changes, consistent with acute diverticulitis. There is a small amount of pericolonic fluid on the left measuring 3.5 x 1.5 cm on image 74/3, but no well-defined extraluminal fluid collection to suggest an abscess. No evidence of bowel obstruction or free air. Vascular/Lymphatic: There are no enlarged abdominal or pelvic lymph nodes. Minimal aortoiliac atherosclerosis without acute vascular findings. The portal, superior mesenteric and splenic veins are patent. Reproductive: The uterus and ovaries appear normal. No adnexal mass. Other: As above, inflammatory changes in the pelvis with a small extraluminal fluid collection lateral to the sigmoid colon. No ascites or free air. Musculoskeletal: No acute or significant osseous findings. Lumbar spondylosis noted. IMPRESSION: 1. Findings are consistent with acute sigmoid diverticulitis. There is a small amount of pericolonic fluid on the left, but no well-defined extraluminal fluid collection to suggest an abscess. No evidence of bowel obstruction or free air. 2. Stable hepatic  hemangioma. 3. Aortic Atherosclerosis (ICD10-I70.0). Electronically Signed   By: Richardean Sale M.D.   On: 05/29/2020 12:06    Procedures Procedures (including critical care time)  Medications Ordered in ED Medications  fentaNYL (SUBLIMAZE) injection 50 mcg (50 mcg Intravenous Given 05/29/20 0952)  iohexol (OMNIPAQUE) 300 MG/ML solution 100 mL (80 mLs Intravenous Contrast Given 05/29/20 1138)    ED Course  I have reviewed the triage vital signs and the nursing notes.  Pertinent labs & imaging results that were available during my care of the patient were reviewed by me and considered in my medical decision making (see chart for details).    MDM Rules/Calculators/A&P                         60 year old lady presenting to ER with concern for abdominal pain, constipation.  On review of systems also endorsed occasional streak of blood in stool when she does have stool.  On physical exam patient overall well-appearing with normal vital signs.  Noted some tenderness in left lower quadrant and proceeded with CT scan to further evaluate.  CT demonstrating acute sigmoid diverticulitis.  No evidence for perforation or abscess.  No leukocytosis.  Regarding the question of blood in stool, this is relatively mild per history and her hemoglobin is normal.  No episodes today.  At present believe patient is appropriate for outpatient management.  Recommend course of Augmentin.  Notified by RN and CT tech, patient had a small amount of contrast extravasation in her arm.  The tech estimated 10 to 15 cc.  I assessed arm, there is a very mild amount of swelling and redness at site. Compartments of arm were all soft. No associated pain. Reviewed with radiologist [facility] and CT tech.  Given the very small amount of contrast and the minimal symptoms at present, okay for observation at home.  Patient was instructed to use ice, elevation and given return precautions regarding the contrast extravasation and her  diverticulitis.   Final Clinical Impression(s) / ED Diagnoses Final diagnoses:  Sigmoid diverticulitis  Extravasation of intravenous contrast medium    Rx / DC Orders ED Discharge Orders    None       Lucrezia Starch, MD 05/29/20 1248

## 2020-06-05 ENCOUNTER — Encounter (HOSPITAL_COMMUNITY): Payer: Self-pay

## 2020-06-05 ENCOUNTER — Inpatient Hospital Stay (HOSPITAL_COMMUNITY)
Admission: EM | Admit: 2020-06-05 | Discharge: 2020-06-28 | DRG: 329 | Disposition: A | Discharge status: Home / Self Care | Payer: No Typology Code available for payment source | Attending: Internal Medicine | Admitting: Internal Medicine

## 2020-06-05 ENCOUNTER — Other Ambulatory Visit: Payer: Self-pay

## 2020-06-05 DIAGNOSIS — K5720 Diverticulitis of large intestine with perforation and abscess without bleeding: Principal | ICD-10-CM | POA: Diagnosis present

## 2020-06-05 DIAGNOSIS — I480 Paroxysmal atrial fibrillation: Secondary | ICD-10-CM | POA: Diagnosis present

## 2020-06-05 DIAGNOSIS — I10 Essential (primary) hypertension: Secondary | ICD-10-CM | POA: Diagnosis present

## 2020-06-05 DIAGNOSIS — Z833 Family history of diabetes mellitus: Secondary | ICD-10-CM

## 2020-06-05 DIAGNOSIS — N3091 Cystitis, unspecified with hematuria: Secondary | ICD-10-CM | POA: Diagnosis present

## 2020-06-05 DIAGNOSIS — Z933 Colostomy status: Secondary | ICD-10-CM

## 2020-06-05 DIAGNOSIS — U071 COVID-19: Secondary | ICD-10-CM | POA: Diagnosis present

## 2020-06-05 DIAGNOSIS — R198 Other specified symptoms and signs involving the digestive system and abdomen: Secondary | ICD-10-CM

## 2020-06-05 DIAGNOSIS — R0902 Hypoxemia: Secondary | ICD-10-CM | POA: Diagnosis not present

## 2020-06-05 DIAGNOSIS — Z9049 Acquired absence of other specified parts of digestive tract: Secondary | ICD-10-CM

## 2020-06-05 DIAGNOSIS — Z803 Family history of malignant neoplasm of breast: Secondary | ICD-10-CM

## 2020-06-05 DIAGNOSIS — Z90710 Acquired absence of both cervix and uterus: Secondary | ICD-10-CM

## 2020-06-05 DIAGNOSIS — R112 Nausea with vomiting, unspecified: Secondary | ICD-10-CM

## 2020-06-05 DIAGNOSIS — R0600 Dyspnea, unspecified: Secondary | ICD-10-CM

## 2020-06-05 DIAGNOSIS — I4892 Unspecified atrial flutter: Secondary | ICD-10-CM | POA: Diagnosis not present

## 2020-06-05 DIAGNOSIS — R1110 Vomiting, unspecified: Secondary | ICD-10-CM

## 2020-06-05 DIAGNOSIS — K567 Ileus, unspecified: Secondary | ICD-10-CM | POA: Diagnosis not present

## 2020-06-05 DIAGNOSIS — K5792 Diverticulitis of intestine, part unspecified, without perforation or abscess without bleeding: Secondary | ICD-10-CM | POA: Diagnosis not present

## 2020-06-05 DIAGNOSIS — D6489 Other specified anemias: Secondary | ICD-10-CM | POA: Diagnosis present

## 2020-06-05 DIAGNOSIS — Z95828 Presence of other vascular implants and grafts: Secondary | ICD-10-CM

## 2020-06-05 DIAGNOSIS — Z79899 Other long term (current) drug therapy: Secondary | ICD-10-CM

## 2020-06-05 DIAGNOSIS — D638 Anemia in other chronic diseases classified elsewhere: Secondary | ICD-10-CM | POA: Diagnosis present

## 2020-06-05 DIAGNOSIS — D7582 Heparin induced thrombocytopenia (HIT): Secondary | ICD-10-CM | POA: Diagnosis not present

## 2020-06-05 DIAGNOSIS — Z7901 Long term (current) use of anticoagulants: Secondary | ICD-10-CM

## 2020-06-05 DIAGNOSIS — G4733 Obstructive sleep apnea (adult) (pediatric): Secondary | ICD-10-CM | POA: Diagnosis present

## 2020-06-05 DIAGNOSIS — R000 Tachycardia, unspecified: Secondary | ICD-10-CM | POA: Diagnosis not present

## 2020-06-05 DIAGNOSIS — Z9889 Other specified postprocedural states: Secondary | ICD-10-CM

## 2020-06-05 DIAGNOSIS — Z8616 Personal history of COVID-19: Secondary | ICD-10-CM | POA: Diagnosis present

## 2020-06-05 DIAGNOSIS — F17210 Nicotine dependence, cigarettes, uncomplicated: Secondary | ICD-10-CM | POA: Diagnosis present

## 2020-06-05 DIAGNOSIS — E440 Moderate protein-calorie malnutrition: Secondary | ICD-10-CM | POA: Diagnosis present

## 2020-06-05 DIAGNOSIS — R739 Hyperglycemia, unspecified: Secondary | ICD-10-CM | POA: Diagnosis not present

## 2020-06-05 DIAGNOSIS — Z960 Presence of urogenital implants: Secondary | ICD-10-CM

## 2020-06-05 DIAGNOSIS — Z6837 Body mass index (BMI) 37.0-37.9, adult: Secondary | ICD-10-CM

## 2020-06-05 DIAGNOSIS — E669 Obesity, unspecified: Secondary | ICD-10-CM | POA: Diagnosis present

## 2020-06-05 DIAGNOSIS — Z8249 Family history of ischemic heart disease and other diseases of the circulatory system: Secondary | ICD-10-CM

## 2020-06-05 HISTORY — DX: Personal history of COVID-19: Z86.16

## 2020-06-05 LAB — COMPREHENSIVE METABOLIC PANEL
ALT: 18 U/L (ref 0–44)
AST: 17 U/L (ref 15–41)
Albumin: 3.3 g/dL — ABNORMAL LOW (ref 3.5–5.0)
Alkaline Phosphatase: 115 U/L (ref 38–126)
Anion gap: 13 (ref 5–15)
BUN: 11 mg/dL (ref 6–20)
CO2: 22 mmol/L (ref 22–32)
Calcium: 9.3 mg/dL (ref 8.9–10.3)
Chloride: 102 mmol/L (ref 98–111)
Creatinine, Ser: 0.95 mg/dL (ref 0.44–1.00)
GFR, Estimated: >60 mL/min (ref >60)
Glucose, Bld: 136 mg/dL — ABNORMAL HIGH (ref 70–99)
Potassium: 4.2 mmol/L (ref 3.5–5.1)
Sodium: 137 mmol/L (ref 135–145)
Total Bilirubin: 0.9 mg/dL (ref 0.3–1.2)
Total Protein: 6.7 g/dL (ref 6.5–8.1)

## 2020-06-05 LAB — CBC
HCT: 39 % (ref 36.0–46.0)
Hemoglobin: 11.8 g/dL — ABNORMAL LOW (ref 12.0–15.0)
MCH: 26.1 pg (ref 26.0–34.0)
MCHC: 30.3 g/dL (ref 30.0–36.0)
MCV: 86.3 fL (ref 80.0–100.0)
Platelets: 326 10*3/uL (ref 150–400)
RBC: 4.52 MIL/uL (ref 3.87–5.11)
RDW: 15.2 % (ref 11.5–15.5)
WBC: 9.8 10*3/uL (ref 4.0–10.5)
nRBC: 0 % (ref 0.0–0.2)

## 2020-06-05 LAB — TYPE AND SCREEN
ABO/RH(D): O POS
Antibody Screen: NEGATIVE

## 2020-06-05 LAB — I-STAT BETA HCG BLOOD, ED (MC, WL, AP ONLY): I-stat hCG, quantitative: <5 m[IU]/mL (ref <5)

## 2020-06-05 NOTE — ED Triage Notes (Signed)
Patient with abdominal pain x a few weeks, was seen at [facility] for same, dx with diverticulitis, given amoxicillin and finished it today, reports abdominal pain, nausea, vomiting, blood in her stool, fever.

## 2020-06-06 ENCOUNTER — Emergency Department (HOSPITAL_COMMUNITY): Payer: No Typology Code available for payment source

## 2020-06-06 DIAGNOSIS — E440 Moderate protein-calorie malnutrition: Secondary | ICD-10-CM | POA: Diagnosis present

## 2020-06-06 DIAGNOSIS — I482 Chronic atrial fibrillation, unspecified: Secondary | ICD-10-CM | POA: Diagnosis not present

## 2020-06-06 DIAGNOSIS — Z8249 Family history of ischemic heart disease and other diseases of the circulatory system: Secondary | ICD-10-CM | POA: Diagnosis not present

## 2020-06-06 DIAGNOSIS — E669 Obesity, unspecified: Secondary | ICD-10-CM | POA: Diagnosis present

## 2020-06-06 DIAGNOSIS — K567 Ileus, unspecified: Secondary | ICD-10-CM | POA: Diagnosis not present

## 2020-06-06 DIAGNOSIS — D638 Anemia in other chronic diseases classified elsewhere: Secondary | ICD-10-CM | POA: Diagnosis present

## 2020-06-06 DIAGNOSIS — G4733 Obstructive sleep apnea (adult) (pediatric): Secondary | ICD-10-CM | POA: Diagnosis present

## 2020-06-06 DIAGNOSIS — Z7901 Long term (current) use of anticoagulants: Secondary | ICD-10-CM | POA: Diagnosis not present

## 2020-06-06 DIAGNOSIS — Z833 Family history of diabetes mellitus: Secondary | ICD-10-CM | POA: Diagnosis not present

## 2020-06-06 DIAGNOSIS — Z79899 Other long term (current) drug therapy: Secondary | ICD-10-CM | POA: Diagnosis not present

## 2020-06-06 DIAGNOSIS — F17210 Nicotine dependence, cigarettes, uncomplicated: Secondary | ICD-10-CM | POA: Diagnosis present

## 2020-06-06 DIAGNOSIS — D6489 Other specified anemias: Secondary | ICD-10-CM | POA: Diagnosis present

## 2020-06-06 DIAGNOSIS — K5720 Diverticulitis of large intestine with perforation and abscess without bleeding: Secondary | ICD-10-CM | POA: Diagnosis present

## 2020-06-06 DIAGNOSIS — K5792 Diverticulitis of intestine, part unspecified, without perforation or abscess without bleeding: Secondary | ICD-10-CM | POA: Diagnosis present

## 2020-06-06 DIAGNOSIS — Z6837 Body mass index (BMI) 37.0-37.9, adult: Secondary | ICD-10-CM | POA: Diagnosis not present

## 2020-06-06 DIAGNOSIS — Z8616 Personal history of COVID-19: Secondary | ICD-10-CM | POA: Diagnosis not present

## 2020-06-06 DIAGNOSIS — Z803 Family history of malignant neoplasm of breast: Secondary | ICD-10-CM | POA: Diagnosis not present

## 2020-06-06 DIAGNOSIS — Z933 Colostomy status: Secondary | ICD-10-CM | POA: Diagnosis not present

## 2020-06-06 DIAGNOSIS — N3091 Cystitis, unspecified with hematuria: Secondary | ICD-10-CM | POA: Diagnosis present

## 2020-06-06 DIAGNOSIS — I4892 Unspecified atrial flutter: Secondary | ICD-10-CM | POA: Diagnosis not present

## 2020-06-06 DIAGNOSIS — R000 Tachycardia, unspecified: Secondary | ICD-10-CM | POA: Diagnosis not present

## 2020-06-06 DIAGNOSIS — Z9049 Acquired absence of other specified parts of digestive tract: Secondary | ICD-10-CM | POA: Diagnosis not present

## 2020-06-06 DIAGNOSIS — U071 COVID-19: Secondary | ICD-10-CM | POA: Diagnosis not present

## 2020-06-06 DIAGNOSIS — I4891 Unspecified atrial fibrillation: Secondary | ICD-10-CM | POA: Diagnosis not present

## 2020-06-06 DIAGNOSIS — R739 Hyperglycemia, unspecified: Secondary | ICD-10-CM | POA: Diagnosis not present

## 2020-06-06 DIAGNOSIS — R0902 Hypoxemia: Secondary | ICD-10-CM | POA: Diagnosis not present

## 2020-06-06 DIAGNOSIS — I480 Paroxysmal atrial fibrillation: Secondary | ICD-10-CM | POA: Diagnosis not present

## 2020-06-06 DIAGNOSIS — I10 Essential (primary) hypertension: Secondary | ICD-10-CM | POA: Diagnosis present

## 2020-06-06 DIAGNOSIS — Z90710 Acquired absence of both cervix and uterus: Secondary | ICD-10-CM | POA: Diagnosis not present

## 2020-06-06 LAB — SARS CORONAVIRUS 2 (TAT 6-24 HRS): SARS Coronavirus 2: POSITIVE — AB

## 2020-06-06 MED ORDER — PIPERACILLIN-TAZOBACTAM 3.375 G IVPB 30 MIN
3.3750 g | Freq: Once | INTRAVENOUS | Status: AC
  Administered 2020-06-06: 3.375 g via INTRAVENOUS
  Filled 2020-06-06: qty 50

## 2020-06-06 MED ORDER — ONDANSETRON HCL 4 MG/2ML IJ SOLN
4.0000 mg | Freq: Once | INTRAMUSCULAR | Status: AC
  Administered 2020-06-06: 4 mg via INTRAVENOUS
  Filled 2020-06-06: qty 2

## 2020-06-06 MED ORDER — IOHEXOL 300 MG/ML  SOLN
100.0000 mL | Freq: Once | INTRAMUSCULAR | Status: AC | PRN
  Administered 2020-06-06: 100 mL via INTRAVENOUS

## 2020-06-06 MED ORDER — OXYCODONE-ACETAMINOPHEN 5-325 MG PO TABS
1.0000 | ORAL_TABLET | Freq: Once | ORAL | Status: AC
  Administered 2020-06-06: 1 via ORAL
  Filled 2020-06-06: qty 1

## 2020-06-06 MED ORDER — HYDROMORPHONE HCL 1 MG/ML IJ SOLN
1.0000 mg | INTRAMUSCULAR | Status: DC | PRN
  Administered 2020-06-06 – 2020-06-13 (×20): 1 mg via INTRAVENOUS
  Filled 2020-06-06 (×20): qty 1

## 2020-06-06 MED ORDER — PIPERACILLIN-TAZOBACTAM 3.375 G IVPB
3.3750 g | Freq: Three times a day (TID) | INTRAVENOUS | Status: DC
  Administered 2020-06-06 – 2020-06-11 (×14): 3.375 g via INTRAVENOUS
  Filled 2020-06-06 (×17): qty 50

## 2020-06-06 MED ORDER — DILTIAZEM HCL ER COATED BEADS 120 MG PO CP24
120.0000 mg | ORAL_CAPSULE | Freq: Every day | ORAL | Status: DC
  Administered 2020-06-06 – 2020-06-17 (×12): 120 mg via ORAL
  Filled 2020-06-06 (×14): qty 1

## 2020-06-06 MED ORDER — ONDANSETRON HCL 4 MG/2ML IJ SOLN
4.0000 mg | Freq: Four times a day (QID) | INTRAMUSCULAR | Status: DC | PRN
  Administered 2020-06-13 – 2020-06-26 (×16): 4 mg via INTRAVENOUS
  Filled 2020-06-06 (×21): qty 2

## 2020-06-06 MED ORDER — MORPHINE SULFATE (PF) 4 MG/ML IV SOLN
4.0000 mg | Freq: Once | INTRAVENOUS | Status: AC
  Administered 2020-06-06: 4 mg via INTRAVENOUS
  Filled 2020-06-06: qty 1

## 2020-06-06 MED ORDER — POTASSIUM CHLORIDE IN NACL 20-0.9 MEQ/L-% IV SOLN
INTRAVENOUS | Status: DC
  Filled 2020-06-06 (×2): qty 1000

## 2020-06-06 NOTE — ED Notes (Signed)
Pt ambulated well around the waiting room. Pt eating snacks and drinking soda. Looks to tolerate well

## 2020-06-06 NOTE — H&P (Signed)
History and Physical    Tammy Davies:[id number] DOB: 05-18-1961 DOA: 06/05/2020  PCP: Patient, No Pcp Per  Patient coming from: home   Chief Complaint: abdominal pain  HPI: Tammy Davies is a 60 y.o. female with medical history significant for paroxysmal a fib on noac, smoker, osa not on cpap, htn, history pancreatitis x1, who presents with the above.  Symptoms began a month ago. Began as constipation. Then developed right lower quadrant pain. At first came and went but has worsened. With some associated nausea and decreased appetite. Stools now abnormal, are watery, sometimes with small amounts of red blood. Seen in this ED 1 week ago and diagnosed w/ acute uncomplicated diverticulitis, prescribed augmentin. Took that medicine but LLQ abdominal pain has worsened. Denies fever, no vomiting. No chest pain or cough or sob. No dysuria. No history colonoscopy. No hx of diverticulitis  ED Course:   CT shows sigmoid diverticulitis with small abscess/phlegmon. Gen surg consulted, advises med mgmt for now  Review of Systems: As per HPI otherwise 10 point review of systems negative.    Past Medical History:  Diagnosis Date  . Dysrhythmia   . PAF (paroxysmal atrial fibrillation) ([place])   . Tobacco abuse     Past Surgical History:  Procedure Laterality Date  . ABDOMINAL HYSTERECTOMY    . CHOLECYSTECTOMY N/A 08/18/2016   Procedure: LAPAROSCOPIC CHOLECYSTECTOMY WITH  INTRAOPERATIVE CHOLANGIOGRAM;  Surgeon: Johnathan Hausen, MD;  Location: [facility];  Service: General;  Laterality: N/A;     reports that she has been smoking cigarettes. She has never used smokeless tobacco. She reports current drug use. Drug: Marijuana. She reports that she does not drink alcohol.  No Known Allergies  Family History  Problem Relation Age of Onset  . Heart failure Mother   . Breast cancer Mother   . Heart failure Father   . Diabetes Father   . Microcephaly Father     Prior to Admission  medications   Medication Sig Start Date End Date Taking? Authorizing Provider  apixaban (ELIQUIS) 5 MG TABS tablet Take 1 tablet (5 mg total) by mouth 2 (two) times daily. 02/26/20   Belva Crome, MD  dicyclomine (BENTYL) 20 MG tablet Take 1 tablet (20 mg total) by mouth every 8 (eight) hours as needed for spasms (Abdominal cramping). 05/09/20   Ward, [name], DO  diltiazem (CARDIZEM CD) 120 MG 24 hr capsule Take 1 capsule (120 mg total) by mouth daily. 06/26/19   Belva Crome, MD  Multiple Vitamin (MULTIVITAMIN) capsule Take 1 capsule by mouth daily.    [provider]  ondansetron (ZOFRAN ODT) 4 MG disintegrating tablet Take 1 tablet (4 mg total) by mouth every 8 (eight) hours as needed for nausea or vomiting. 05/09/20   Ward, [name], DO    Physical Exam: Vitals:   06/06/20 0838 06/06/20 1243 06/06/20 1524 06/06/20 1831  BP: 116/80 (!) 132/106 111/85 (!) 122/104  Pulse: 62 69 66 (!) 121  Resp: 18 20 18 20   Temp: 99.3 F (37.4 C) 98.7 F (37.1 C) 98.6 F (37 C) 99 F (37.2 C)  TempSrc: Oral Oral Oral   SpO2: 98% 96% 99% 95%  Weight:      Height:        Constitutional: No acute distress Head: Atraumatic Eyes: Conjunctiva clear ENM: Moist mucous membranes. Normal dentition.  Neck: Supple Respiratory: Clear to auscultation bilaterally, no wheezing/rales/rhonchi. Normal respiratory effort. No accessory muscle use. . Cardiovascular: Regular rate and rhythm.  No murmurs/rubs/gallops. Abdomen: obese, soft, ttp LLQ, no guarding  Musculoskeletal: No joint deformity upper and lower extremities. Normal ROM, no contractures. Normal muscle tone.  Skin: No rashes, lesions, or ulcers.  Extremities: No peripheral edema. Palpable peripheral pulses. Neurologic: Alert, moving all 4 extremities. Psychiatric: Normal insight and judgement.   Labs on Admission: I have personally reviewed following labs and imaging studies  CBC: Recent Labs  Lab 06/05/20 2232  WBC 9.8  HGB  11.8*  HCT 39.0  MCV 86.3  PLT 709   Basic Metabolic Panel: Recent Labs  Lab 06/05/20 2232  NA 137  K 4.2  CL 102  CO2 22  GLUCOSE 136*  BUN 11  CREATININE 0.95  CALCIUM 9.3   GFR: Estimated Creatinine Clearance: 83.3 mL/min (by C-G formula based on SCr of 0.95 mg/dL). Liver Function Tests: Recent Labs  Lab 06/05/20 2232  AST 17  ALT 18  ALKPHOS 115  BILITOT 0.9  PROT 6.7  ALBUMIN 3.3*   No results for input(s): LIPASE, AMYLASE in the last 168 hours. No results for input(s): AMMONIA in the last 168 hours. Coagulation Profile: No results for input(s): INR, PROTIME in the last 168 hours. Cardiac Enzymes: No results for input(s): CKTOTAL, CKMB, CKMBINDEX, TROPONINI in the last 168 hours. BNP (last 3 results) No results for input(s): PROBNP in the last 8760 hours. HbA1C: No results for input(s): HGBA1C in the last 72 hours. CBG: No results for input(s): GLUCAP in the last 168 hours. Lipid Profile: No results for input(s): CHOL, HDL, LDLCALC, TRIG, CHOLHDL, LDLDIRECT in the last 72 hours. Thyroid Function Tests: No results for input(s): TSH, T4TOTAL, FREET4, T3FREE, THYROIDAB in the last 72 hours. Anemia Panel: No results for input(s): VITAMINB12, FOLATE, FERRITIN, TIBC, IRON, RETICCTPCT in the last 72 hours. Urine analysis:    Component Value Date/Time   COLORURINE AMBER (A) 05/29/2020 0951   APPEARANCEUR HAZY (A) 05/29/2020 0951   LABSPEC 1.028 05/29/2020 0951   PHURINE 5.0 05/29/2020 0951   GLUCOSEU NEGATIVE 05/29/2020 0951   HGBUR NEGATIVE 05/29/2020 0951   BILIRUBINUR NEGATIVE 05/29/2020 0951   KETONESUR 5 (A) 05/29/2020 0951   PROTEINUR 30 (A) 05/29/2020 0951   UROBILINOGEN 0.2 09/09/2016 1206   NITRITE NEGATIVE 05/29/2020 0951   LEUKOCYTESUR NEGATIVE 05/29/2020 0951    Radiological Exams on Admission: CT ABDOMEN PELVIS W CONTRAST  Result Date: 06/06/2020 CLINICAL DATA:  Diverticulitis, constipation, worsening pain EXAM: CT ABDOMEN AND PELVIS WITH  CONTRAST TECHNIQUE: Multidetector CT imaging of the abdomen and pelvis was performed using the standard protocol following bolus administration of intravenous contrast. CONTRAST:  120m OMNIPAQUE IOHEXOL 300 MG/ML  SOLN COMPARISON:  CT abdomen pelvis, 05/29/2020, abdominal MR, 08/15/2016 FINDINGS: Lower chest: No acute abnormality. 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 Status post cholecystectomy. No biliary ductal dilatation. Pancreas: Unremarkable. No pancreatic ductal dilatation or surrounding inflammatory changes. Spleen: Normal in size without significant abnormality. Adrenals/Urinary Tract: Adrenal glands are unremarkable. Kidneys are normal, without renal calculi, solid lesion, or hydronephrosis. Bladder is unremarkable. Stomach/Bowel: Stomach is within normal limits. Incidental diverticula of the descending portion of the duodenum. Appendix appears normal. Sigmoid diverticulosis with redemonstrated long segment wall thickening and adjacent fat stranding about the mid to distal sigmoid colon. Suspect a small phlegmon or abscess just inferior to the distal sigmoid colon, closely abutting the posterior aspect of the lower uterine  segment, measuring approximately 3.4 x 3.3 x 2.7 cm (series 3, image 74, series 7, image 76). Unchanged small volume of fluid posterior to the midportion of the sigmoid measuring approximately 3.4 x 1.7 cm (series 3, image 70). Vascular/Lymphatic: No significant vascular findings are present. No enlarged abdominal or pelvic lymph nodes. Reproductive: No mass or other significant abnormality. Other: No abdominal wall hernia or abnormality. No abdominopelvic ascites. Musculoskeletal: No acute or significant osseous findings. IMPRESSION: 1. Sigmoid diverticulosis with redemonstrated long segment  wall thickening and adjacent fat stranding about the mid to distal sigmoid colon, consistent with acute diverticulitis. 2. Suspect a small phlegmon or abscess just inferior to the distal sigmoid colon, closely abutting the posterior aspect of the lower uterine segment, measuring approximately 3.4 x 3.3 x 2.7 cm, which is more defined appearing than on prior examination. 3. Unchanged small volume of fluid posterior to the midportion of the sigmoid, which is not clearly loculated. 4. 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 Electronically Signed   By: Eddie Candle M.D.   On: 06/06/2020 15:01    EKG: Independently reviewed. nsr  Assessment/Plan Active Problems:   Acute diverticulitis    # Acute complicated diverticulitis CT shows sigmoid diverticulitis with small (less than 4 cm) phlegmon/abscess which is consistent with patient's symptoms. Hemodynamically stable. Seen by gen surg, advises med mgmt for now, abscess too small for IR drainage - continue zosyn - npo - pain control w/ dilaudid; zofran prn - monitor  - outpt colonoscopy  # Paroxysmal a fib chads2vasc of 2, is on eliquis. In sinus and rate controlled currently - cont home dilt - hold home eliquis given possibility of surgery  # Anemia H 11.8 from normal previously. Is having small amount of lower GI bleeding presumably from diverticulitis - monitor for now  # Tobacco abuse - declines nicotine patch  # OSA Says this has not been formally diagnosed, not on cpap  # HTN Here bp wnl - cont home dilt  # healthcare maintenance - f/u hiv, hep c, a1c  DVT prophylaxis: SCDs for now, would start pharmacologic ppx if pt shows signs clinical improvement Code Status: full  Family Communication: son updated @ bedside  Consults called: gen surg    Status is: Inpatient  Remains inpatient appropriate  because:Inpatient level of care appropriate due to severity of illness   Dispo: The patient is from: Home              Anticipated d/c is to: Home              Anticipated d/c date is: 3 days              Patient currently is not medically stable to d/c.        Desma Maxim MD [facility] Pager [phone]  If 7PM-7AM, please contact night-coverage [email or web address] Password [email or web address]  06/06/2020, 6:41 PM

## 2020-06-06 NOTE — ED Provider Notes (Signed)
[facility] EMERGENCY DEPARTMENT Provider Note   CSN: [id number] Arrival date & time: 06/05/20  2216     History Chief Complaint  Patient presents with  . Abdominal Pain    Tammy Davies is a 60 y.o. female.  The history is provided by the patient and medical records. No language interpreter was used.  Abdominal Pain    60 year old female significant history of paroxysmal atrial fibrillation currently on Eliquis, hypertension, tobacco use, liver mass, presenting for evaluation of abdominal pain.  Patient has had intermittent abdominal pain for the past several weeks with associated constipation and occasional streaks of blood in her stool.  She was seen in the ED 8 days ago for her complaint.  An abdominal pelvis CT scan obtained at that time demonstrated acute sigmoid diverticulitis and patient subsequently discharged home with antibiotic.  Patient states she was treated with Augmentin and she just finished the antibiotic yesterday.  She still endorse intermittent left lower quadrant abdominal pain however pain became more intense since yesterday.  Pain is sharp achy with associated bloody stool.  She denies nausea.  She does not complain of any fever chills but does endorse approximately 15 pounds of weight loss within the past several weeks.  She is a smoker.  She has been vaccinated for COVID-19.  She denies any dysuria.  She report pain was initially 9 out of 10 when she came in here today but after receiving Percocet while waiting her pain is 2 out of 10.  She believes the pain will return.  Past Medical History:  Diagnosis Date  . Dysrhythmia   . PAF (paroxysmal atrial fibrillation) ([place])   . Tobacco abuse     Patient Active Problem List   Diagnosis Date Noted  . Elevated glucose 06/08/2017  . Hypertension 06/08/2017  . S/P laparoscopic cholecystectomy 08/20/2016  . Tobacco abuse 08/20/2016  . AF (paroxysmal atrial fibrillation) ([place]) 08/20/2016  .  Pancreatitis 08/15/2016  . Liver mass, 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    Past Surgical History:  Procedure Laterality Date  . ABDOMINAL HYSTERECTOMY    . CHOLECYSTECTOMY N/A 08/18/2016   Procedure: LAPAROSCOPIC CHOLECYSTECTOMY WITH  INTRAOPERATIVE CHOLANGIOGRAM;  Surgeon: Johnathan Hausen, MD;  Location: [facility] ORS;  Service: General;  Laterality: N/A;     OB History   No obstetric history on file.     Family History  Problem Relation Age of Onset  . Heart failure Mother   . Breast cancer Mother   . Heart failure Father   . Diabetes Father   . Microcephaly Father     Social History   Tobacco Use  . Smoking status: Current Every Day Smoker    Types: Cigarettes  . Smokeless tobacco: Never Used  Vaping Use  . Vaping Use: Never used  Substance Use Topics  . Alcohol use: No  . Drug use: Yes    Types: Marijuana    Home Medications Prior to Admission medications   Medication Sig Start Date End Date Taking? Authorizing Provider  apixaban (ELIQUIS) 5 MG TABS tablet Take 1 tablet (5 mg total) by mouth 2 (two) times daily. 02/26/20   Belva Crome, MD  dicyclomine (BENTYL) 20 MG tablet Take 1 tablet (20 mg total) by mouth every 8 (eight) hours as needed for spasms (Abdominal cramping). 05/09/20   Ward, [name], DO  diltiazem (CARDIZEM CD) 120 MG 24 hr capsule Take 1 capsule (120 mg total) by mouth daily. 06/26/19   Belva Crome, MD  Multiple Vitamin (MULTIVITAMIN) capsule Take 1 capsule by mouth daily.    [provider]  ondansetron (ZOFRAN ODT) 4 MG disintegrating tablet Take 1 tablet (4 mg total) by mouth every 8 (eight) hours as needed for nausea or vomiting. 05/09/20   Ward, [name], DO    Allergies    Patient has no known allergies.  Review of Systems   Review of Systems  Gastrointestinal: Positive for abdominal pain.  All other systems reviewed and are negative.   Physical Exam Updated Vital Signs BP (!) 132/106   Pulse 69   Temp 98.7 F (37.1 C) (Oral)   Resp  20   Ht 5' 8"  (1.727 m)   Wt 111.1 kg   SpO2 96%   BMI 37.24 kg/m   Physical Exam Vitals and nursing note reviewed.  Constitutional:      General: She is not in acute distress.    Appearance: She is well-developed and well-nourished.  HENT:     Head: Atraumatic.  Eyes:     Conjunctiva/sclera: Conjunctivae normal.  Cardiovascular:     Rate and Rhythm: Rhythm irregular.  Pulmonary:     Effort: Pulmonary effort is normal.     Breath sounds: Normal breath sounds. No wheezing, rhonchi or rales.  Abdominal:     General: Abdomen is flat.     Palpations: Abdomen is soft.     Tenderness: There is abdominal tenderness in the left lower quadrant. There is no right CVA tenderness, left CVA tenderness, guarding or rebound. Negative signs include Murphy's sign, Rovsing's sign and McBurney's sign.     Hernia: No hernia is present.  Musculoskeletal:     Cervical back: Neck supple.  Skin:    Findings: No rash.  Neurological:     Mental Status: She is alert.  Psychiatric:        Mood and Affect: Mood and affect and mood normal.     ED Results / Procedures / Treatments   Labs (all labs ordered are listed, but only abnormal results are displayed) Labs Reviewed  COMPREHENSIVE METABOLIC PANEL - Abnormal; Notable for the following components:      Result Value   Glucose, Bld 136 (*)    Albumin 3.3 (*)    All other components within normal limits  CBC - Abnormal; Notable for the following components:   Hemoglobin 11.8 (*)    All other components within normal limits  SARS CORONAVIRUS 2 (TAT 6-24 HRS)  I-STAT BETA HCG BLOOD, ED ([facility], [facility], [facility] ONLY)  TYPE AND SCREEN  ABO/RH    EKG None  Radiology CT ABDOMEN PELVIS W CONTRAST  Result Date: 06/06/2020 CLINICAL DATA:  Diverticulitis, constipation, worsening pain EXAM: CT ABDOMEN AND PELVIS WITH CONTRAST TECHNIQUE: Multidetector CT imaging of the abdomen and pelvis was performed using the standard protocol following bolus administration  of intravenous contrast. CONTRAST:  171m OMNIPAQUE IOHEXOL 300 MG/ML  SOLN COMPARISON:  CT abdomen pelvis, 05/29/2020, abdominal MR, 08/15/2016 FINDINGS: Lower chest: No acute abnormality. 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 Status post cholecystectomy. No biliary ductal dilatation. Pancreas: Unremarkable. No pancreatic ductal dilatation or surrounding inflammatory changes. Spleen: Normal in size without significant abnormality. Adrenals/Urinary Tract: Adrenal glands are unremarkable. Kidneys are normal, without renal calculi, solid lesion, or hydronephrosis. Bladder is unremarkable. Stomach/Bowel: Stomach is within normal limits. Incidental diverticula of the descending  portion of the duodenum. Appendix appears normal. Sigmoid diverticulosis with redemonstrated long segment wall thickening and adjacent fat stranding about the mid to distal sigmoid colon. Suspect a small phlegmon or abscess just inferior to the distal sigmoid colon, closely abutting the posterior aspect of the lower uterine segment, measuring approximately 3.4 x 3.3 x 2.7 cm (series 3, image 74, series 7, image 76). Unchanged small volume of fluid posterior to the midportion of the sigmoid measuring approximately 3.4 x 1.7 cm (series 3, image 70). Vascular/Lymphatic: No significant vascular findings are present. No enlarged abdominal or pelvic lymph nodes. Reproductive: No mass or other significant abnormality. Other: No abdominal wall hernia or abnormality. No abdominopelvic ascites. Musculoskeletal: No acute or significant osseous findings. IMPRESSION: 1. Sigmoid diverticulosis with redemonstrated long segment wall thickening and adjacent fat stranding about the mid to distal sigmoid colon, consistent with acute diverticulitis. 2. Suspect a small  phlegmon or abscess just inferior to the distal sigmoid colon, closely abutting the posterior aspect of the lower uterine segment, measuring approximately 3.4 x 3.3 x 2.7 cm, which is more defined appearing than on prior examination. 3. Unchanged small volume of fluid posterior to the midportion of the sigmoid, which is not clearly loculated. 4. 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 Electronically Signed   By: Eddie Candle M.D.   On: 06/06/2020 15:01    Procedures Procedures (including critical care time)  Medications Ordered in ED Medications  oxyCODONE-acetaminophen (PERCOCET/ROXICET) 5-325 MG per tablet 1 tablet (1 tablet Oral Given 06/06/20 1053)  morphine 4 MG/ML injection 4 mg (4 mg Intravenous Given 06/06/20 1351)  ondansetron (ZOFRAN) injection 4 mg (4 mg Intravenous Given 06/06/20 1350)  iohexol (OMNIPAQUE) 300 MG/ML solution 100 mL (100 mLs Intravenous Contrast Given 06/06/20 1428)    ED Course  I have reviewed the triage vital signs and the nursing notes.  Pertinent labs & imaging results that were available during my care of the patient were reviewed by me and considered in my medical decision making (see chart for details).    MDM Rules/Calculators/A&P                          BP (!) 132/106   Pulse 69   Temp 98.7 F (37.1 C) (Oral)   Resp 20   Ht 5' 8"  (1.727 m)   Wt 111.1 kg   SpO2 96%   BMI 37.24 kg/m   Final Clinical Impression(s) / ED Diagnoses Final diagnoses:  Acute diverticulitis  Abscess of sigmoid colon due to diverticulitis    Rx / DC Orders ED Discharge Orders    None     1:44 PM Patient here with recurrent abdominal discomfort primary to the left lower quadrant which has been ongoing issue for the past several weeks, recently diagnosed with sigmoid diverticulitis on his CT scan at approximately 8 days ago when she was  seen in the ED.  She was given Augmentin in which she had finished with the course of treatment but now report having worsening left lower quadrant abdominal pain.  On exam she does have some mild tenderness to her left lower quadrant but no guarding or rebound tenderness.  However due to potential diverticular complication such as perforation or abscess, will repeat abdominal pelvis CT scan.  3:12 PM Unfortunately repeat abdominal pelvis CT scan did still demonstrate sigmoid diverticulitis with suspected small  phlegmon or abscess just inferior to the distal sigmoid colon which is more defined appearing compared to prior examination.  Since patient failed outpatient treatment with Augmentin, she will need to be admitted to the hospital for further management and likely IV antibiotic.  General surgery will also need to be involved.  Patient signed out to oncoming provider who will consult surgery and will consult medicine for admission.  We will inquire the appropriate antibiotic choice for this patient.  Fortunately labs are reassuring.  Screening COVID test ordered.  Patient made aware of findings and agrees with plan.   Domenic Moras, PA-C 06/06/20 [place], MD 06/07/20 [phone]

## 2020-06-06 NOTE — ED Notes (Signed)
Pt ambulatory to [facility]

## 2020-06-06 NOTE — Progress Notes (Signed)
Pharmacy Antibiotic Note  Tammy Davies is a 60 y.o. female admitted on 06/05/2020 with diverticulitis w/ abscess.  Pharmacy has been consulted for Zosyn dosing.  Plan: Zosyn 3.375g IV every 8 hours (extended infusion) Monitor renal function, clinical progression and surgery plans for LOT  Height: 5' 8"  (172.7 cm) Weight: 111.1 kg (244 lb 14.9 oz) IBW/kg (Calculated) : 63.9  Temp (24hrs), Avg:99 F (37.2 C), Min:98.6 F (37 C), Max:99.3 F (37.4 C)  Recent Labs  Lab 06/05/20 2232  WBC 9.8  CREATININE 0.95    Estimated Creatinine Clearance: 83.3 mL/min (by C-G formula based on SCr of 0.95 mg/dL).    No Known Allergies  Bertis Ruddy, PharmD Clinical Pharmacist ED Pharmacist Phone # [phone] 06/06/2020 6:49 PM

## 2020-06-06 NOTE — ED Provider Notes (Signed)
Accepted handoff at shift change from [facility]. Please see prior provider note for more detail.   Briefly: Patient is 60 y.o.   DDX: concern for abscess/phlegmon complication from diverticulitis  Plan: follow up on CT scan   Physical Exam  BP 111/85 (BP Location: Right Arm)   Pulse 66   Temp 98.6 F (37 C) (Oral)   Resp 18   Ht 5\' 8"  (1.727 m)   Wt 111.1 kg   SpO2 99%   BMI 37.24 kg/m   Physical Exam  ED Course/Procedures     Procedures Results for orders placed or performed during the hospital encounter of 06/05/20  Comprehensive metabolic panel  Result Value Ref Range   Sodium 137 135 - 145 mmol/L   Potassium 4.2 3.5 - 5.1 mmol/L   Chloride 102 98 - 111 mmol/L   CO2 22 22 - 32 mmol/L   Glucose, Bld 136 (H) 70 - 99 mg/dL   BUN 11 6 - 20 mg/dL   Creatinine, Ser 0.95 0.44 - 1.00 mg/dL   Calcium 9.3 8.9 - 10.3 mg/dL   Total Protein 6.7 6.5 - 8.1 g/dL   Albumin 3.3 (L) 3.5 - 5.0 g/dL   AST 17 15 - 41 U/L   ALT 18 0 - 44 U/L   Alkaline Phosphatase 115 38 - 126 U/L   Total Bilirubin 0.9 0.3 - 1.2 mg/dL   GFR, Estimated >60 >60 mL/min   Anion gap 13 5 - 15  CBC  Result Value Ref Range   WBC 9.8 4.0 - 10.5 K/uL   RBC 4.52 3.87 - 5.11 MIL/uL   Hemoglobin 11.8 (L) 12.0 - 15.0 g/dL   HCT 39.0 36.0 - 46.0 %   MCV 86.3 80.0 - 100.0 fL   MCH 26.1 26.0 - 34.0 pg   MCHC 30.3 30.0 - 36.0 g/dL   RDW 15.2 11.5 - 15.5 %   Platelets 326 150 - 400 K/uL   nRBC 0.0 0.0 - 0.2 %  I-Stat beta hCG blood, ED  Result Value Ref Range   I-stat hCG, quantitative <5.0 <5 mIU/mL   Comment 3          Type and screen [place]  Result Value Ref Range   ABO/RH(D) O POS    Antibody Screen NEG    Sample Expiration      06/08/2020,2359 Performed at [facility], [address] [place], [place] [address]    DG Abd 1 View  Result Date: 05/09/2020 CLINICAL DATA:  Constipation, less likely bowel obstruction, lower abdominal pain EXAM: ABDOMEN - 1 VIEW  COMPARISON:  MR abdomen 08/15/2016 FINDINGS: Evaluation is limited by body habitus. No high-grade obstructive bowel gas pattern is seen. Moderate colonic stool burden. Cholecystectomy clips in the right upper quadrant. No suspicious abdominal calcifications. Degenerative changes in the spine, hips and pelvis. Lung bases and included cardiomediastinal contours are unremarkable. IMPRESSION: Moderate colonic stool burden. No high-grade obstructive bowel gas pattern. Electronically Signed   By: Lovena Le M.D.   On: 05/09/2020 05:29   CT ABDOMEN PELVIS W CONTRAST  Result Date: 06/06/2020 CLINICAL DATA:  Diverticulitis, constipation, worsening pain EXAM: CT ABDOMEN AND PELVIS WITH CONTRAST TECHNIQUE: Multidetector CT imaging of the abdomen and pelvis was performed using the standard protocol following bolus administration of intravenous contrast. CONTRAST:  124mL OMNIPAQUE IOHEXOL 300 MG/ML  SOLN COMPARISON:  CT abdomen pelvis, 05/29/2020, abdominal MR, 08/15/2016 FINDINGS: Lower chest: No acute abnormality. Hepatobiliary: No solid liver  abnormality is seen.Redemonstrated large subcapsular hemangiomata, most conspicuously in the inferior right lobe of the liver, hepatic segment VI, better characterized by prior MR (series 3, image 27). Status post cholecystectomy. No biliary ductal dilatation. Pancreas: Unremarkable. No pancreatic ductal dilatation or surrounding inflammatory changes. Spleen: Normal in size without significant abnormality. Adrenals/Urinary Tract: Adrenal glands are unremarkable. Kidneys are normal, without renal calculi, solid lesion, or hydronephrosis. Bladder is unremarkable. Stomach/Bowel: Stomach is within normal limits. Incidental diverticula of the descending portion of the duodenum. Appendix appears normal. Sigmoid diverticulosis with redemonstrated long segment wall thickening and adjacent fat stranding about the mid to distal sigmoid colon. Suspect a small phlegmon or abscess just inferior  to the distal sigmoid colon, closely abutting the posterior aspect of the lower uterine segment, measuring approximately 3.4 x 3.3 x 2.7 cm (series 3, image 74, series 7, image 76). Unchanged small volume of fluid posterior to the midportion of the sigmoid measuring approximately 3.4 x 1.7 cm (series 3, image 70). Vascular/Lymphatic: No significant vascular findings are present. No enlarged abdominal or pelvic lymph nodes. Reproductive: No mass or other significant abnormality. Other: No abdominal wall hernia or abnormality. No abdominopelvic ascites. Musculoskeletal: No acute or significant osseous findings. IMPRESSION: 1. Sigmoid diverticulosis with redemonstrated long segment wall thickening and adjacent fat stranding about the mid to distal sigmoid colon, consistent with acute diverticulitis. 2. Suspect a small phlegmon or abscess just inferior to the distal sigmoid colon, closely abutting the posterior aspect of the lower uterine segment, measuring approximately 3.4 x 3.3 x 2.7 cm, which is more defined appearing than on prior examination. 3. Unchanged small volume of fluid posterior to the midportion of the sigmoid, which is not clearly loculated. 4. 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 Electronically Signed   By: Eddie Candle M.D.   On: 06/06/2020 15:01   CT ABDOMEN PELVIS W CONTRAST  Addendum Date: 05/29/2020   ADDENDUM REPORT: 05/29/2020 12:53 ADDENDUM: This case was discussed with Dr. Roslynn Amble following its interpretation. He reports that there was small volume contrast extravasation at the IV site during the injection for the CT which had not been reported in the technologist notes. Technologist indicates this was approximately 10 cc of contrast. Small extravasation volumes of contrast are typically without consequence. Recommend elevation of the extremity with  intermittent cold and hot compresses. Electronically Signed   By: Richardean Sale M.D.   On: 05/29/2020 12:53   Result Date: 05/29/2020 CLINICAL DATA:  Constipation for 1 month with left lower quadrant abdominal pain. Diverticulitis suspected. EXAM: CT ABDOMEN AND PELVIS WITH CONTRAST TECHNIQUE: Multidetector CT imaging of the abdomen and pelvis was performed using the standard protocol following bolus administration of intravenous contrast. CONTRAST:  61mL OMNIPAQUE IOHEXOL 300 MG/ML  SOLN COMPARISON:  Abdominal MRI 08/15/2016. FINDINGS: Lower chest: Clear lung bases. No significant pleural or pericardial effusion. Hepatobiliary: Mass involving segments 6 and 7 previously characterized as a hemangioma on MRI is unchanged in size, measuring 8.3 x 6.6 cm on image 28/3. There is focal fat superior to the cholecystectomy bed. No new or enlarging hepatic lesions. No significant biliary dilatation post cholecystectomy. Pancreas: Unremarkable. No pancreatic ductal dilatation or surrounding inflammatory changes. Spleen: Normal in size without focal abnormality. Adrenals/Urinary Tract: Both adrenal glands appear normal. The kidneys appear normal without evidence of urinary tract calculus, suspicious lesion or hydronephrosis. No bladder abnormalities are seen. Stomach/Bowel: No enteric contrast was administered.  The stomach and small bowel appear normal aside from a small proximal duodenal diverticulum. The appendix and proximal colon appear normal. There is long segment wall thickening of the sigmoid colon associated with diverticulosis and surrounding inflammatory changes, consistent with acute diverticulitis. There is a small amount of pericolonic fluid on the left measuring 3.5 x 1.5 cm on image 74/3, but no well-defined extraluminal fluid collection to suggest an abscess. No evidence of bowel obstruction or free air. Vascular/Lymphatic: There are no enlarged abdominal or pelvic lymph nodes. Minimal aortoiliac  atherosclerosis without acute vascular findings. The portal, superior mesenteric and splenic veins are patent. Reproductive: The uterus and ovaries appear normal. No adnexal mass. Other: As above, inflammatory changes in the pelvis with a small extraluminal fluid collection lateral to the sigmoid colon. No ascites or free air. Musculoskeletal: No acute or significant osseous findings. Lumbar spondylosis noted. IMPRESSION: 1. Findings are consistent with acute sigmoid diverticulitis. There is a small amount of pericolonic fluid on the left, but no well-defined extraluminal fluid collection to suggest an abscess. No evidence of bowel obstruction or free air. 2. Stable hepatic hemangioma. 3. Aortic Atherosclerosis (ICD10-I70.0). Electronically Signed: By: Richardean Sale M.D. On: 05/29/2020 12:06    MDM   CT scan shows phlegmon/abscess of the sigmoid colon appears overall more inflammation compared to prior.  Pain is well controlled at this time.  Last Eliquis was yesterday morning per patient.  4:59 PM with Dr. Redmond Pulling of general surgery who will see patient at bedside and is requesting hospitalist admission given the patient's paroxysmal A. fib history and that she is on blood thinner.   Consult placed to unassigned as patient does not have a primary care doctor on file and states that she is not currently following of any PCP.  She states that she has plans to reach out to [facility] to make an appointment but does not currently have an appointment scheduled.  Patient's pain is well controlled at this time.  She is understanding of plan. 5:33 PM     Tedd Sias, PA 06/06/20 1733    Truddie Hidden, MD 06/06/20 2103

## 2020-06-06 NOTE — Consult Note (Signed)
Reason for Consult:Diverticulitis with possible abscess Referring Physician: Karle Starch, MD  Tammy Davies is an 60 y.o. female.  HPI: This is a 60 year old female with atrial fibrillation, hypertension, tobacco abuse, chronic anticoagulation on Eliquis presents with worsening lower abdominal pain.  She was seen 8 days ago with similar complaints.  CT scan showed acute sigmoid diverticulitis and the patient was discharged on Augmentin.  Her pain became more intense today so she returned to the ED for evaluation.  Afebrile.  WBC normal.  CT scan shows a small area that could be an abscess vs. Phlegmon.  Past Medical History:  Diagnosis Date  . Dysrhythmia   . PAF (paroxysmal atrial fibrillation) ([place])   . Tobacco abuse     Past Surgical History:  Procedure Laterality Date  . ABDOMINAL HYSTERECTOMY    . CHOLECYSTECTOMY N/A 08/18/2016   Procedure: LAPAROSCOPIC CHOLECYSTECTOMY WITH  INTRAOPERATIVE CHOLANGIOGRAM;  Surgeon: Johnathan Hausen, MD;  Location: [facility];  Service: General;  Laterality: N/A;    Family History  Problem Relation Age of Onset  . Heart failure Mother   . Breast cancer Mother   . Heart failure Father   . Diabetes Father   . Microcephaly Father     Social History:  reports that she has been smoking cigarettes. She has never used smokeless tobacco. She reports current drug use. Drug: Marijuana. She reports that she does not drink alcohol.  Allergies: No Known Allergies  Medications:  Prior to Admission medications   Medication Sig Start Date End Date Taking? Authorizing Provider  apixaban (ELIQUIS) 5 MG TABS tablet Take 1 tablet (5 mg total) by mouth 2 (two) times daily. 02/26/20   Belva Crome, MD  dicyclomine (BENTYL) 20 MG tablet Take 1 tablet (20 mg total) by mouth every 8 (eight) hours as needed for spasms (Abdominal cramping). 05/09/20   Ward, [name], DO  diltiazem (CARDIZEM CD) 120 MG 24 hr capsule Take 1 capsule (120 mg total) by mouth daily. 06/26/19    Belva Crome, MD  Multiple Vitamin (MULTIVITAMIN) capsule Take 1 capsule by mouth daily.    [provider]  ondansetron (ZOFRAN ODT) 4 MG disintegrating tablet Take 1 tablet (4 mg total) by mouth every 8 (eight) hours as needed for nausea or vomiting. 05/09/20   Ward, [name], DO     Results for orders placed or performed during the hospital encounter of 06/05/20 (from the past 48 hour(s))  Type and screen [place]     Status: None   Collection Time: 06/05/20 10:30 PM  Result Value Ref Range   ABO/RH(D) O POS    Antibody Screen NEG    Sample Expiration      06/08/2020,2359 Performed at [facility], [place] [address] [place], [place] [address]   Comprehensive metabolic panel     Status: Abnormal   Collection Time: 06/05/20 10:32 PM  Result Value Ref Range   Sodium 137 135 - 145 mmol/L   Potassium 4.2 3.5 - 5.1 mmol/L   Chloride 102 98 - 111 mmol/L   CO2 22 22 - 32 mmol/L   Glucose, Bld 136 (H) 70 - 99 mg/dL    Comment: Glucose reference range applies only to samples taken after fasting for at least 8 hours.   BUN 11 6 - 20 mg/dL   Creatinine, Ser 0.95 0.44 - 1.00 mg/dL   Calcium 9.3 8.9 - 10.3 mg/dL   Total Protein 6.7 6.5 - 8.1 g/dL  Albumin 3.3 (L) 3.5 - 5.0 g/dL   AST 17 15 - 41 U/L   ALT 18 0 - 44 U/L   Alkaline Phosphatase 115 38 - 126 U/L   Total Bilirubin 0.9 0.3 - 1.2 mg/dL   GFR, Estimated >60 >60 mL/min    Comment: (NOTE) Calculated using the CKD-EPI Creatinine Equation (2021)    Anion gap 13 5 - 15    Comment: Performed at [facility] [address], [place], [place] [address]  CBC     Status: Abnormal   Collection Time: 06/05/20 10:32 PM  Result Value Ref Range   WBC 9.8 4.0 - 10.5 K/uL   RBC 4.52 3.87 - 5.11 MIL/uL   Hemoglobin 11.8 (L) 12.0 - 15.0 g/dL   HCT 39.0 36.0 - 46.0 %   MCV 86.3 80.0 - 100.0 fL   MCH 26.1 26.0 - 34.0 pg   MCHC 30.3 30.0 - 36.0 g/dL   RDW 15.2 11.5 - 15.5 %   Platelets 326 150 -  400 K/uL   nRBC 0.0 0.0 - 0.2 %    Comment: Performed at [facility], [place] [address], [place], [place] [address]  I-Stat beta hCG blood, ED     Status: None   Collection Time: 06/05/20 11:12 PM  Result Value Ref Range   I-stat hCG, quantitative <5.0 <5 mIU/mL   Comment 3            Comment:   GEST. AGE      CONC.  (mIU/mL)   <=1 WEEK        5 - 50     2 WEEKS       50 - 500     3 WEEKS       100 - 10,000     4 WEEKS     1,000 - 30,000        FEMALE AND NON-PREGNANT FEMALE:     LESS THAN 5 mIU/mL     CT ABDOMEN PELVIS W CONTRAST  Result Date: 06/06/2020 CLINICAL DATA:  Diverticulitis, constipation, worsening pain EXAM: CT ABDOMEN AND PELVIS WITH CONTRAST TECHNIQUE: Multidetector CT imaging of the abdomen and pelvis was performed using the standard protocol following bolus administration of intravenous contrast. CONTRAST:  163m OMNIPAQUE IOHEXOL 300 MG/ML  SOLN COMPARISON:  CT abdomen pelvis, 05/29/2020, abdominal MR, 08/15/2016 FINDINGS: Lower chest: No acute abnormality. 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 Status post cholecystectomy. No biliary ductal dilatation. Pancreas: Unremarkable. No pancreatic ductal dilatation or surrounding inflammatory changes. Spleen: Normal in size without significant abnormality. Adrenals/Urinary Tract: Adrenal glands are unremarkable. Kidneys are normal, without renal calculi, solid lesion, or hydronephrosis. Bladder is unremarkable. Stomach/Bowel: Stomach is within normal limits. Incidental diverticula of the descending portion of the duodenum. Appendix appears normal. Sigmoid diverticulosis with redemonstrated long segment wall thickening and adjacent fat stranding about the mid to distal sigmoid colon. Suspect a small phlegmon or abscess just inferior to the  distal sigmoid colon, closely abutting the posterior aspect of the lower uterine segment, measuring approximately 3.4 x 3.3 x 2.7 cm (series 3, image 74, series 7, image 76). Unchanged small volume of fluid posterior to the midportion of the sigmoid measuring approximately 3.4 x 1.7 cm (series 3, image 70). Vascular/Lymphatic: No significant vascular findings are present. No enlarged abdominal or pelvic lymph  nodes. Reproductive: No mass or other significant abnormality. Other: No abdominal wall hernia or abnormality. No abdominopelvic ascites. Musculoskeletal: No acute or significant osseous findings. IMPRESSION: 1. Sigmoid diverticulosis with redemonstrated long segment wall thickening and adjacent fat stranding about the mid to distal sigmoid colon, consistent with acute diverticulitis. 2. Suspect a small phlegmon or abscess just inferior to the distal sigmoid colon, closely abutting the posterior aspect of the lower uterine segment, measuring approximately 3.4 x 3.3 x 2.7 cm, which is more defined appearing than on prior examination. 3. Unchanged small volume of fluid posterior to the midportion of the sigmoid, which is not clearly loculated. 4. 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 Electronically Signed   By: Eddie Candle M.D.   On: 06/06/2020 15:01    Review of Systems  Gastrointestinal: Positive for abdominal pain.  All other systems reviewed and are negative.  Blood pressure 111/85, pulse 66, temperature 98.6 F (37 C), temperature source Oral, resp. rate 18, height 5' 8"  (1.727 m), weight 111.1 kg, SpO2 99 %. Physical Exam Constitutional:  WDWN in NAD, conversant, no obvious deformities; lying in bed comfortably Eyes:  Pupils equal, round; sclera anicteric; moist conjunctiva; no lid lag HENT:  Oral mucosa moist; good dentition  Neck:  No masses palpated, trachea  midline; no thyromegaly Lungs:  CTA bilaterally; normal respiratory effort CV:  Regular rate and rhythm; no murmurs; extremities well-perfused with no edema Abd:  +bowel sounds, soft, LLQ tenderness, no palpable organomegaly; no palpable hernias Musc:  Unable to assess gait; no apparent clubbing or cyanosis in extremities Lymphatic:  No palpable cervical or axillary lymphadenopathy Skin:  Warm, dry; no sign of jaundice Psychiatric - alert and oriented x 4; calm mood and affect  Assessment/Plan: Acute sigmoid diverticulitis with small phlegmon/ abscess in distal sigmoid colon  No acute surgical indications Abscess appears too small for IR drain Would hold anticoagulation in case she needs surgery IV antibiotics (Zosyn) Bowel rest - ice chips  We will follow along with you.  If no improvement, she might need urgent colectomy/ colostomy.  Imogene Burn Tabbatha Bordelon 06/06/2020, 5:34 PM

## 2020-06-07 DIAGNOSIS — I480 Paroxysmal atrial fibrillation: Secondary | ICD-10-CM

## 2020-06-07 LAB — TSH: TSH: 5.348 u[IU]/mL — ABNORMAL HIGH (ref 0.350–4.500)

## 2020-06-07 LAB — COMPREHENSIVE METABOLIC PANEL
ALT: 20 U/L (ref 0–44)
AST: 23 U/L (ref 15–41)
Albumin: 2.8 g/dL — ABNORMAL LOW (ref 3.5–5.0)
Alkaline Phosphatase: 94 U/L (ref 38–126)
Anion gap: 12 (ref 5–15)
BUN: 14 mg/dL (ref 6–20)
CO2: 22 mmol/L (ref 22–32)
Calcium: 8.8 mg/dL — ABNORMAL LOW (ref 8.9–10.3)
Chloride: 103 mmol/L (ref 98–111)
Creatinine, Ser: 0.88 mg/dL (ref 0.44–1.00)
GFR, Estimated: >60 mL/min (ref >60)
Glucose, Bld: 102 mg/dL — ABNORMAL HIGH (ref 70–99)
Potassium: 4.4 mmol/L (ref 3.5–5.1)
Sodium: 137 mmol/L (ref 135–145)
Total Bilirubin: 1 mg/dL (ref 0.3–1.2)
Total Protein: 5.8 g/dL — ABNORMAL LOW (ref 6.5–8.1)

## 2020-06-07 LAB — CBC
HCT: 34.2 % — ABNORMAL LOW (ref 36.0–46.0)
Hemoglobin: 10.2 g/dL — ABNORMAL LOW (ref 12.0–15.0)
MCH: 26.2 pg (ref 26.0–34.0)
MCHC: 29.8 g/dL — ABNORMAL LOW (ref 30.0–36.0)
MCV: 87.9 fL (ref 80.0–100.0)
Platelets: 242 10*3/uL (ref 150–400)
RBC: 3.89 MIL/uL (ref 3.87–5.11)
RDW: 15.5 % (ref 11.5–15.5)
WBC: 5.7 10*3/uL (ref 4.0–10.5)
nRBC: 0 % (ref 0.0–0.2)

## 2020-06-07 LAB — APTT
aPTT: 34 seconds (ref 24–36)
aPTT: 38 seconds — ABNORMAL HIGH (ref 24–36)

## 2020-06-07 LAB — ABO/RH: ABO/RH(D): O POS

## 2020-06-07 LAB — HIV ANTIBODY (ROUTINE TESTING W REFLEX): HIV Screen 4th Generation wRfx: NONREACTIVE

## 2020-06-07 LAB — HEPATITIS C ANTIBODY: HCV Ab: NONREACTIVE

## 2020-06-07 LAB — HEMOGLOBIN A1C
Hgb A1c MFr Bld: 5.6 % (ref 4.8–5.6)
Mean Plasma Glucose: 114.02 mg/dL

## 2020-06-07 LAB — HEPARIN LEVEL (UNFRACTIONATED)
Heparin Unfractionated: 0.31 IU/mL (ref 0.30–0.70)
Heparin Unfractionated: 0.28 IU/mL — ABNORMAL LOW (ref 0.30–0.70)

## 2020-06-07 MED ORDER — ACETAMINOPHEN 325 MG PO TABS: 650.0000 mg | ORAL_TABLET | Freq: Four times a day (QID) | ORAL | Status: DC | PRN

## 2020-06-07 MED ORDER — SENNOSIDES-DOCUSATE SODIUM 8.6-50 MG PO TABS
1.0000 | ORAL_TABLET | Freq: Every evening | ORAL | Status: DC | PRN
  Administered 2020-06-09: 1 via ORAL
  Filled 2020-06-07: qty 1

## 2020-06-07 MED ORDER — SODIUM CHLORIDE 0.9 % IV BOLUS
500.0000 mL | Freq: Once | INTRAVENOUS | Status: AC
  Administered 2020-06-07: 500 mL via INTRAVENOUS

## 2020-06-07 MED ORDER — CLOTRIMAZOLE 2 % VA CREA
1.0000 | TOPICAL_CREAM | Freq: Every day | VAGINAL | Status: AC
  Administered 2020-06-07 – 2020-06-08 (×3): 1 via VAGINAL
  Filled 2020-06-07: qty 21

## 2020-06-07 MED ORDER — HEPARIN (PORCINE) 25000 UT/250ML-% IV SOLN
2350.0000 [IU]/h | INTRAVENOUS | Status: AC
  Administered 2020-06-07: 13:00:00 1300 [IU]/h via INTRAVENOUS
  Administered 2020-06-08 – 2020-06-09 (×2): 1800 [IU]/h via INTRAVENOUS
  Administered 2020-06-09: 1850 [IU]/h via INTRAVENOUS
  Administered 2020-06-10 – 2020-06-14 (×9): 2250 [IU]/h via INTRAVENOUS
  Administered 2020-06-15: 2400 [IU]/h via INTRAVENOUS
  Administered 2020-06-15 – 2020-06-17 (×4): 2350 [IU]/h via INTRAVENOUS
  Filled 2020-06-07 (×25): qty 250

## 2020-06-07 MED ORDER — HEPARIN BOLUS VIA INFUSION
4000.0000 [IU] | Freq: Once | INTRAVENOUS | Status: AC
  Administered 2020-06-07: 4000 [IU] via INTRAVENOUS
  Filled 2020-06-07: qty 4000

## 2020-06-07 MED ORDER — IPRATROPIUM-ALBUTEROL 20-100 MCG/ACT IN AERS
1.0000 | INHALATION_SPRAY | Freq: Four times a day (QID) | RESPIRATORY_TRACT | Status: DC
  Administered 2020-06-08: 1 via RESPIRATORY_TRACT
  Filled 2020-06-07: qty 4

## 2020-06-07 MED ORDER — DM-GUAIFENESIN ER 30-600 MG PO TB12: 1.0000 | ORAL_TABLET | Freq: Two times a day (BID) | ORAL | Status: DC | PRN

## 2020-06-07 MED ORDER — SODIUM CHLORIDE 0.9 % IV SOLN: INTRAVENOUS | Status: AC

## 2020-06-07 MED ORDER — HEPARIN BOLUS VIA INFUSION
2250.0000 [IU] | Freq: Once | INTRAVENOUS | Status: AC
  Administered 2020-06-07: 2250 [IU] via INTRAVENOUS
  Filled 2020-06-07: qty 2250

## 2020-06-07 NOTE — ED Notes (Signed)
This RN notified MD due to pt being sinus tach and hypotension for intervention

## 2020-06-07 NOTE — Progress Notes (Signed)
ANTICOAGULATION CONSULT NOTE - Initial Consult  Pharmacy Consult for heparin Indication: atrial fibrillation  No Known Allergies  Patient Measurements: Height: 5' 8"  (172.7 cm) Weight: 111.1 kg (244 lb 14.9 oz) IBW/kg (Calculated) : 63.9 Heparin Dosing Weight: 89kg  Vital Signs: Temp: 98.3 F (36.8 C) (01/21 0647) Temp Source: Oral (01/21 0647) BP: 99/72 (01/21 0647) Pulse Rate: 109 (01/21 0315)  Labs: Recent Labs    06/05/20 2232 06/07/20 0603  HGB 11.8* 10.2*  HCT 39.0 34.2*  PLT 326 242  CREATININE 0.95 0.88    Estimated Creatinine Clearance: 90 mL/min (by C-G formula based on SCr of 0.88 mg/dL).   Medical History: Past Medical History:  Diagnosis Date  . Dysrhythmia   . PAF (paroxysmal atrial fibrillation) ([place])   . Tobacco abuse     Medications:  Medications Prior to Admission  Medication Sig Dispense Refill Last Dose  . apixaban (ELIQUIS) 5 MG TABS tablet Take 1 tablet (5 mg total) by mouth 2 (two) times daily. 180 tablet 1 06/05/2020 at 0900  . dicyclomine (BENTYL) 20 MG tablet Take 1 tablet (20 mg total) by mouth every 8 (eight) hours as needed for spasms (Abdominal cramping). 15 tablet 0 unk  . diltiazem (CARDIZEM CD) 120 MG 24 hr capsule Take 1 capsule (120 mg total) by mouth daily. 90 capsule 2 06/05/2020 at Unknown time  . ibuprofen (ADVIL) 200 MG tablet Take 600 mg by mouth every 12 (twelve) hours as needed for headache or moderate pain.   06/05/2020 at Unknown time  . Multiple Vitamin (MULTIVITAMIN) capsule Take 1 capsule by mouth daily.   06/05/2020 at Unknown time  . ondansetron (ZOFRAN ODT) 4 MG disintegrating tablet Take 1 tablet (4 mg total) by mouth every 8 (eight) hours as needed for nausea or vomiting. 20 tablet 0 unk   Scheduled:  . clotrimazole  1 Applicatorful Vaginal QHS  . diltiazem  120 mg Oral Daily  . Ipratropium-Albuterol  1 puff Inhalation Q6H   Infusions:  . sodium chloride    . piperacillin-tazobactam (ZOSYN)  IV 3.375 g  (06/07/20 0544)    Assessment: Pt was recently discharged from here for diverticulitis with possible abscess. She has been on chronic apixaban for her afib. She presented with worsening abd pain. Plan is to hold apixaban and bridge with IV heparin for possible intervention if needed. Her last dose of apixaban was on 1/19. We will get baseline labs then start IV heparin.   Goal of Therapy:  Heparin level 0.3-0.7 units/ml Monitor platelets by anticoagulation protocol: Yes   Plan:  Baseline PTT and heparin level Heparin bolus 4000 units x1 Heparin infusion at 1300 units/hr F/u with 6 hr HL/PTT  Onnie Boer, PharmD, [place], AAHIVP, CPP Infectious Disease Pharmacist 06/07/2020 9:01 AM

## 2020-06-07 NOTE — Progress Notes (Signed)
   06/07/20 0445  Assess: MEWS Score  Temp 97.7 F (36.5 C)  BP 104/67  ECG Heart Rate (!) 125  Resp 17  Level of Consciousness Alert  SpO2 98 %  O2 Device Room Air  Assess: MEWS Score  MEWS Temp 0  MEWS Systolic 0  MEWS Pulse 2  MEWS RR 0  MEWS LOC 0  MEWS Score 2  MEWS Score Color Yellow  Assess: if the MEWS score is Yellow or Red  Were vital signs taken at a resting state? Yes  Focused Assessment No change from prior assessment  Early Detection of Sepsis Score *See Row Information* Medium  MEWS guidelines implemented *See Row Information* Yes  Treat  MEWS Interventions Administered scheduled meds/treatments  Pain Scale 0-10  Pain Score 6  Pain Type Acute pain  Pain Location Abdomen  Take Vital Signs  Increase Vital Sign Frequency  Yellow: Q 2hr X 2 then Q 4hr X 2, if remains yellow, continue Q 4hrs  Escalate  MEWS: Escalate Yellow: discuss with charge nurse/RN and consider discussing with provider and RRT  Notify: Charge Nurse/RN  Name of Charge Nurse/RN Notified Nicole, RN  Date Charge Nurse/RN Notified 06/07/20  Time Charge Nurse/RN Notified 0530  Notify: Provider  Provider Name/Title Dr.Zierle-Ghosh  Date Provider Notified 06/07/20  Time Provider Notified [phone]  Notification Type Page  Notification Reason Other (Comment) (elevated heart rate)  Response See new orders  Date of Provider Response 06/07/20  Time of Provider Response 0530  Document  Patient Outcome Other (Comment) (remain on unit)  Progress note created (see row info) Yes

## 2020-06-07 NOTE — Progress Notes (Addendum)
ANTICOAGULATION CONSULT NOTE - Follow Up Consult  Pharmacy Consult for IV Heparin Indication: atrial fibrillation  No Known Allergies  Patient Measurements: Height: 5\' 8"  (172.7 cm) Weight: 111.1 kg (244 lb 14.9 oz) IBW/kg (Calculated) : 63.9 Heparin Dosing Weight: 89 kg  Vital Signs: Temp: 98.2 F (36.8 C) (01/21 1427) Temp Source: Oral (01/21 1427) BP: 96/81 (01/21 1427) Pulse Rate: 80 (01/21 1427)  Labs: Recent Labs    06/05/20 2232 06/07/20 0603 06/07/20 0951 06/07/20 1958  HGB 11.8* 10.2*  --   --   HCT 39.0 34.2*  --   --   PLT 326 242  --   --   APTT  --   --  34  --   HEPARINUNFRC  --   --  0.31 0.28*  CREATININE 0.95 0.88  --   --     Estimated Creatinine Clearance: 90 mL/min (by C-G formula based on SCr of 0.88 mg/dL).   Medical History: Past Medical History:  Diagnosis Date   Dysrhythmia    PAF (paroxysmal atrial fibrillation) ([place])    Tobacco abuse     Assessment: Pt was recently [facility] for diverticulitis with possible abscess. She is on chronic apixaban for afib (last dose on 1/19). She presented with worsening abd pain. Plan is to hold apixaban and bridge with IV heparin for possible intervention if needed. Pharmacy was consulted to dose IV heparin. Baseline aPTT and heparin level were 34 sec and 0.31 units/ml, respectively, indicating that apixaban is influencing heparin level. Given recent apixaban exposure, will monitor anticoagulation using aPTT until aPTT and heparin levels correlate.  aPTT and heparin level ~6.5 hrs after heparin 4000 units IV bolus X 1, followed by heparin infusion at 1300 units/hr, were 38 sec and 0.28 units/ml, respectively. H/H 10.2/34.2, platelets 242. Per RN, no issues with IV or bleeding observed.  Goal of Therapy:  Heparin level 0.3-0.7 units/ml Monitor platelets by anticoagulation protocol: Yes   Plan:  Heparin 2250 units IV bolus X 1 Increase heparin infusion to 1550 units/hr Check  6-hr heparin level, aPTT Monitor daily heparin level, CBC Monitor for bleeding  Gillermina Hu, PharmD, BCPS, [facility] Clinical Pharmacist 06/07/2020 8:46 PM

## 2020-06-07 NOTE — Progress Notes (Signed)
Patient arrived to unit [place] bed 30 from emergency department.Patient alert and oriented x 4. Oriented patient to nursing unit and call bell system.No acute distress noted at present time.

## 2020-06-07 NOTE — Progress Notes (Signed)
PROGRESS NOTE    Tammy Davies  HFS:[id number] DOB: Jun 21, 1960 DOA: 06/05/2020 PCP: Patient, No Pcp Per   Brief Narrative:   60 year old with history of paroxysmal A. fib on anticoagulation, tobacco use, obstructive sleep apnea on CPAP, HTN admitted for abdominal pain which has been ongoing for the past month which previously was treated as constipation and then uncomplicated diverticulitis about a week ago with Augmentin.  She presents with worsening of her symptoms.  CT abdomen pelvis showed sigmoid diverticulitis with small abscess.  General surgery consulted and started on Zosyn.  Assessment & Plan:   Active Problems:   Acute diverticulitis  Acute complicated diverticulitis with small abscess - Clear liquid diet, pain control, conservative management - General surgery following - No role for general surgery or IR drain at the moment but this may change - IV Zosyn   history of paroxysmal atrial fibrillation - On Cardizem.  Eliquis on hold.  We will place her on heparin drip anemia of chronic disease - Monitor hemoglobin closely.  Essential hypertension -continue home Cardizem     DVT prophylaxis: Heparin drip Code Status: Full code Family Communication:    Status is: Inpatient  Remains inpatient appropriate because:Inpatient level of care appropriate due to severity of illness   Dispo: The patient is from: Home              Anticipated d/c is to: Home              Anticipated d/c date is: 2 days              Patient currently is not medically stable to d/c.  Complicated diverticulitis on IV antibiotics and heparin drip.       Body mass index is 37.24 kg/m.       Subjective: Patient feels better this morning no complaints.  Has been dealing with constipation and then uncomplicated diverticulitis outpatient for the past month which progressed.  Review of Systems Otherwise negative except as per HPI, including: General: Denies fever, chills, night  sweats or unintended weight loss. Resp: Denies cough, wheezing, shortness of breath. Cardiac: Denies chest pain, palpitations, orthopnea, paroxysmal nocturnal dyspnea. GI: Denies abdominal pain, nausea, vomiting, diarrhea or constipation GU: Denies dysuria, frequency, hesitancy or incontinence MS: Denies muscle aches, joint pain or swelling Neuro: Denies headache, neurologic deficits (focal weakness, numbness, tingling), abnormal gait Psych: Denies anxiety, depression, SI/HI/AVH Skin: Denies new rashes or lesions ID: Denies sick contacts, exotic exposures, travel  Examination:  General exam: Appears calm and comfortable  Respiratory system: Clear to auscultation. Respiratory effort normal. Cardiovascular system: S1 & S2 heard, RRR. No JVD, murmurs, rubs, gallops or clicks. No pedal edema. Gastrointestinal system: Abdomen is nondistended, soft and nontender. No organomegaly or masses felt. Normal bowel sounds heard. Central nervous system: Alert and oriented. No focal neurological deficits. Extremities: Symmetric 5 x 5 power. Skin: No rashes, lesions or ulcers Psychiatry: Judgement and insight appear normal. Mood & affect appropriate.     Objective: Vitals:   06/07/20 0445 06/07/20 0647 06/07/20 0940 06/07/20 1149  BP: 104/67 99/72 110/74 103/71  Pulse:    95  Resp: 17 18  18   Temp: 97.7 F (36.5 C) 98.3 F (36.8 C)  98 F (36.7 C)  TempSrc: Oral Oral  Oral  SpO2: 98% 96%  97%  Weight:      Height:        Intake/Output Summary (Last 24 hours) at 06/07/2020 1206 Last data filed at 06/07/2020 2334 Gross  per 24 hour  Intake 600 ml  Output --  Net 600 ml   Filed Weights   06/05/20 2219  Weight: 111.1 kg     Data Reviewed:   CBC: Recent Labs  Lab 06/05/20 2232 06/07/20 0603  WBC 9.8 5.7  HGB 11.8* 10.2*  HCT 39.0 34.2*  MCV 86.3 87.9  PLT 326 962   Basic Metabolic Panel: Recent Labs  Lab 06/05/20 2232 06/07/20 0603  NA 137 137  K 4.2 4.4  CL 102 103   CO2 22 22  GLUCOSE 136* 102*  BUN 11 14  CREATININE 0.95 0.88  CALCIUM 9.3 8.8*   GFR: Estimated Creatinine Clearance: 90 mL/min (by C-G formula based on SCr of 0.88 mg/dL). Liver Function Tests: Recent Labs  Lab 06/05/20 2232 06/07/20 0603  AST 17 23  ALT 18 20  ALKPHOS 115 94  BILITOT 0.9 1.0  PROT 6.7 5.8*  ALBUMIN 3.3* 2.8*   No results for input(s): LIPASE, AMYLASE in the last 168 hours. No results for input(s): AMMONIA in the last 168 hours. Coagulation Profile: No results for input(s): INR, PROTIME in the last 168 hours. Cardiac Enzymes: No results for input(s): CKTOTAL, CKMB, CKMBINDEX, TROPONINI in the last 168 hours. BNP (last 3 results) No results for input(s): PROBNP in the last 8760 hours. HbA1C: Recent Labs    06/07/20 0603  HGBA1C 5.6   CBG: No results for input(s): GLUCAP in the last 168 hours. Lipid Profile: No results for input(s): CHOL, HDL, LDLCALC, TRIG, CHOLHDL, LDLDIRECT in the last 72 hours. Thyroid Function Tests: Recent Labs    06/07/20 0951  TSH 5.348*   Anemia Panel: No results for input(s): VITAMINB12, FOLATE, FERRITIN, TIBC, IRON, RETICCTPCT in the last 72 hours. Sepsis Labs: No results for input(s): PROCALCITON, LATICACIDVEN in the last 168 hours.  Recent Results (from the past 240 hour(s))  SARS CORONAVIRUS 2 (TAT 6-24 HRS) Nasopharyngeal Nasopharyngeal Swab     Status: Abnormal   Collection Time: 06/06/20  3:08 PM   Specimen: Nasopharyngeal Swab  Result Value Ref Range Status   SARS Coronavirus 2 POSITIVE (A) NEGATIVE Final    Comment: (NOTE) SARS-CoV-2 target nucleic acids are DETECTED.  The SARS-CoV-2 RNA is generally detectable in upper and lower respiratory specimens during the acute phase of infection. Positive results are indicative of the presence of SARS-CoV-2 RNA. Clinical correlation with patient history and other diagnostic information is  necessary to determine patient infection status. Positive results  do not rule out bacterial infection or co-infection with other viruses.  The expected result is Negative.  Fact Sheet for Patients: SugarRoll.be  Fact Sheet for Healthcare Providers: https://www.woods-mathews.com/  This test is not yet approved or cleared by the [place] FDA and  has been authorized for detection and/or diagnosis of SARS-CoV-2 by FDA under an Emergency Use Authorization (EUA). This EUA will remain  in effect (meaning this test can be used) for the duration of the COVID-19 declaration under Section 564(b)(1) of the Act, 21 U. S.C. section 360bbb-3(b)(1), unless the authorization is terminated or revoked sooner.   Performed at [facility], [place] [address] [place], [place] [address]          Radiology Studies: CT ABDOMEN PELVIS W CONTRAST  Result Date: 06/06/2020 CLINICAL DATA:  Diverticulitis, constipation, worsening pain EXAM: CT ABDOMEN AND PELVIS WITH CONTRAST TECHNIQUE: Multidetector CT imaging of the abdomen and pelvis was performed using the standard protocol following bolus administration of intravenous contrast. CONTRAST:  118m OMNIPAQUE  IOHEXOL 300 MG/ML  SOLN COMPARISON:  CT abdomen pelvis, 05/29/2020, abdominal MR, 08/15/2016 FINDINGS: Lower chest: No acute abnormality. 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 Status post cholecystectomy. No biliary ductal dilatation. Pancreas: Unremarkable. No pancreatic ductal dilatation or surrounding inflammatory changes. Spleen: Normal in size without significant abnormality. Adrenals/Urinary Tract: Adrenal glands are unremarkable. Kidneys are normal, without renal calculi, solid lesion, or hydronephrosis. Bladder is unremarkable. Stomach/Bowel: Stomach is within normal limits.  Incidental diverticula of the descending portion of the duodenum. Appendix appears normal. Sigmoid diverticulosis with redemonstrated long segment wall thickening and adjacent fat stranding about the mid to distal sigmoid colon. Suspect a small phlegmon or abscess just inferior to the distal sigmoid colon, closely abutting the posterior aspect of the lower uterine segment, measuring approximately 3.4 x 3.3 x 2.7 cm (series 3, image 74, series 7, image 76). Unchanged small volume of fluid posterior to the midportion of the sigmoid measuring approximately 3.4 x 1.7 cm (series 3, image 70). Vascular/Lymphatic: No significant vascular findings are present. No enlarged abdominal or pelvic lymph nodes. Reproductive: No mass or other significant abnormality. Other: No abdominal wall hernia or abnormality. No abdominopelvic ascites. Musculoskeletal: No acute or significant osseous findings. IMPRESSION: 1. Sigmoid diverticulosis with redemonstrated long segment wall thickening and adjacent fat stranding about the mid to distal sigmoid colon, consistent with acute diverticulitis. 2. Suspect a small phlegmon or abscess just inferior to the distal sigmoid colon, closely abutting the posterior aspect of the lower uterine segment, measuring approximately 3.4 x 3.3 x 2.7 cm, which is more defined appearing than on prior examination. 3. Unchanged small volume of fluid posterior to the midportion of the sigmoid, which is not clearly loculated. 4. 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 Electronically Signed   By: Eddie Candle M.D.   On: 06/06/2020 15:01        Scheduled Meds: . clotrimazole  1 Applicatorful Vaginal QHS  . diltiazem  120 mg Oral Daily  . heparin  4,000 Units Intravenous Once  . Ipratropium-Albuterol  1 puff Inhalation Q6H   Continuous Infusions: . sodium chloride 100 mL/hr at  06/07/20 0936  . heparin    . piperacillin-tazobactam (ZOSYN)  IV 3.375 g (06/07/20 0544)     LOS: 1 day   Time spent= 35 mins    Maribeth Jiles Arsenio Loader, MD [facility]  If 7PM-7AM, please contact night-coverage  06/07/2020, 12:06 PM

## 2020-06-07 NOTE — Progress Notes (Signed)
Subjective: CC: Patient reports that her pain has greatly improved this morning and after pain medication around 5am it has resolved. She notes some nausea but no emesis. She is no passing flatus. No BM this am. No prior hx of diverticulitis per patient and she has never had a colonoscopy.   Objective: Vital signs in last 24 hours: Temp:  [97.7 F (36.5 C)-99 F (37.2 C)] 98.3 F (36.8 C) (01/21 0647) Pulse Rate:  [66-145] 109 (01/21 0315) Resp:  [12-21] 18 (01/21 0647) BP: (92-132)/(62-106) 99/72 (01/21 0647) SpO2:  [95 %-99 %] 96 % (01/21 0647) Last BM Date: 06/06/20  Intake/Output from previous day: 01/20 0701 - 01/21 0700 In: 600 [IV Piggyback:600] Out: -  Intake/Output this shift: No intake/output data recorded.  PE: Gen:  Alert, NAD, pleasant Card:  Tachycardic in the 110's Pulm:  Rate and effort normal Abd: Soft, ND, mild tenderness of the suprapubic and LLQ without peritonitis, +BS, prior abdominal scars are well healed.  Ext:  No LE edema  Psych: A&Ox3  Skin: no rashes noted, warm and dry   Lab Results:  Recent Labs    06/05/20 2232 06/07/20 0603  WBC 9.8 5.7  HGB 11.8* 10.2*  HCT 39.0 34.2*  PLT 326 242   BMET Recent Labs    06/05/20 2232 06/07/20 0603  NA 137 137  K 4.2 4.4  CL 102 103  CO2 22 22  GLUCOSE 136* 102*  BUN 11 14  CREATININE 0.95 0.88  CALCIUM 9.3 8.8*   PT/INR No results for input(s): LABPROT, INR in the last 72 hours. CMP     Component Value Date/Time   NA 137 06/07/2020 0603   NA 141 07/19/2019 0840   K 4.4 06/07/2020 0603   CL 103 06/07/2020 0603   CO2 22 06/07/2020 0603   GLUCOSE 102 (H) 06/07/2020 0603   BUN 14 06/07/2020 0603   BUN 13 07/19/2019 0840   CREATININE 0.88 06/07/2020 0603   CREATININE 0.88 09/09/2016 1146   CALCIUM 8.8 (L) 06/07/2020 0603   PROT 5.8 (L) 06/07/2020 0603   ALBUMIN 2.8 (L) 06/07/2020 0603   AST 23 06/07/2020 0603   ALT 20 06/07/2020 0603   ALKPHOS 94 06/07/2020 0603    BILITOT 1.0 06/07/2020 0603   GFRNONAA >60 06/07/2020 0603   GFRNONAA 74 09/09/2016 1146   GFRAA 105 07/19/2019 0840   GFRAA 86 09/09/2016 1146   Lipase     Component Value Date/Time   LIPASE 25 05/29/2020 0951       Studies/Results: CT ABDOMEN PELVIS W CONTRAST  Result Date: 06/06/2020 CLINICAL DATA:  Diverticulitis, constipation, worsening pain EXAM: CT ABDOMEN AND PELVIS WITH CONTRAST TECHNIQUE: Multidetector CT imaging of the abdomen and pelvis was performed using the standard protocol following bolus administration of intravenous contrast. CONTRAST:  175mL OMNIPAQUE IOHEXOL 300 MG/ML  SOLN COMPARISON:  CT abdomen pelvis, 05/29/2020, abdominal MR, 08/15/2016 FINDINGS: Lower chest: No acute abnormality. 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 Status post cholecystectomy. No biliary ductal dilatation. Pancreas: Unremarkable. No pancreatic ductal dilatation or surrounding inflammatory changes. Spleen: Normal in size without significant abnormality. Adrenals/Urinary Tract: Adrenal glands are unremarkable. Kidneys are normal, without renal calculi, solid lesion, or hydronephrosis. Bladder is unremarkable. Stomach/Bowel: Stomach is within normal limits. Incidental diverticula of the descending portion of the duodenum. Appendix appears  normal. Sigmoid diverticulosis with redemonstrated long segment wall thickening and adjacent fat stranding about the mid to distal sigmoid colon. Suspect a small phlegmon or abscess just inferior to the distal sigmoid colon, closely abutting the posterior aspect of the lower uterine segment, measuring approximately 3.4 x 3.3 x 2.7 cm (series 3, image 74, series 7, image 76). Unchanged small volume of fluid posterior to the midportion of the sigmoid measuring approximately 3.4 x 1.7  cm (series 3, image 70). Vascular/Lymphatic: No significant vascular findings are present. No enlarged abdominal or pelvic lymph nodes. Reproductive: No mass or other significant abnormality. Other: No abdominal wall hernia or abnormality. No abdominopelvic ascites. Musculoskeletal: No acute or significant osseous findings. IMPRESSION: 1. Sigmoid diverticulosis with redemonstrated long segment wall thickening and adjacent fat stranding about the mid to distal sigmoid colon, consistent with acute diverticulitis. 2. Suspect a small phlegmon or abscess just inferior to the distal sigmoid colon, closely abutting the posterior aspect of the lower uterine segment, measuring approximately 3.4 x 3.3 x 2.7 cm, which is more defined appearing than on prior examination. 3. Unchanged small volume of fluid posterior to the midportion of the sigmoid, which is not clearly loculated. 4. 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 Electronically Signed   By: Eddie Candle M.D.   On: 06/06/2020 15:01    Anti-infectives: Anti-infectives (From admission, onward)   Start     Dose/Rate Route Frequency Ordered Stop   06/06/20 1850  piperacillin-tazobactam (ZOSYN) IVPB 3.375 g        3.375 g 12.5 mL/hr over 240 Minutes Intravenous Every 8 hours 06/06/20 1850     06/06/20 1830  piperacillin-tazobactam (ZOSYN) IVPB 3.375 g        3.375 g 100 mL/hr over 30 Minutes Intravenous  Once 06/06/20 1818 06/06/20 2039       Assessment/Plan Paroxysmal A. Fib on Eliquis at home  OSA HTN COVID +  Acute sigmoid diverticulitis with small phlegmon/ abscess in distal sigmoid colon - Initially dx w/ diverticulitis on 1/12 and d/c with Augmentin. Failed outpatient therapy. CT 1/20 showed sigmoid diverticulitis w/ small phlegmon vs abscess just inferior to distal sigmoid colon measuring 3.4 x 3.3 x 2.7 cm. Our team  felt this was to small for IR drainage.  - She is afebrile and WBC normal at 5.7. Symptomatically her pain is improving and she only has mild tenderness on exam. - Cont IV abx - Allows sips of clears and ice chips - Repeat labs in the AM. If she was to worsen (increased abdominal pain, leukocytosis, fever etc) she may need a repeat CT scan in a few days to determine if fluid collection is developing into a more formal abscess that would require IR drainage.  - Hopefully patient will improve with conservative tx. We discussed if she was to worsen and require surgery during admission, this would likely result in a colostomy.  - If patient improves with conservative tx and aviods surgery, would recommend a colonoscopy in 4-6 weeks. She denies hx of colonoscopy in the past - We will follow with you  FEN - Sips/Chips VTE - SCDs, heparin gtt (Eliquis on hold) ID - Zosyn    LOS: 1 day    Jillyn Ledger , [facility] 06/07/2020, 9:23 AM Please see Amion for pager number during day hours 7:00am-4:30pm

## 2020-06-07 NOTE — Progress Notes (Signed)
Initial Nutrition Assessment  DOCUMENTATION CODES:   Obesity unspecified  INTERVENTION:   RD to order nutritional supplements once diet advances as appropriate.   NUTRITION DIAGNOSIS:   Inadequate oral intake related to inability to eat as evidenced by NPO status.  GOAL:   Patient will meet greater than or equal to 90% of their needs  MONITOR:   Skin,Weight trends,Labs,I & O's,Diet advancement  REASON FOR ASSESSMENT:   Malnutrition Screening Tool    ASSESSMENT:   60 year old with history of paroxysmal A. fib on anticoagulation, tobacco use, obstructive sleep apnea on CPAP, HTN admitted for abdominal pain. CT abdomen pelvis showed sigmoid diverticulitis with small abscess. Pt COVID positive.  Pt is currently NPO. Per MD possible plans for diet advancement to a clear liquid diet tomorrow. Abdominal pain improving. Pt with no weight loss per weight records. Unable to complete Nutrition-Focused physical exam at this time. Labs and medications reviewed.   Diet Order:   Diet Order            Diet NPO time specified Except for: Sips with Meds, Ice Chips, Other (See Comments)  Diet effective now                 EDUCATION NEEDS:   Not appropriate for education at this time  Skin:  Skin Assessment: Reviewed RN Assessment  Last BM:  1/21  Height:   Ht Readings from Last 1 Encounters:  06/05/20 5\' 8"  (1.727 m)    Weight:   Wt Readings from Last 1 Encounters:  06/05/20 111.1 kg    BMI:  Body mass index is 37.24 kg/m.  Estimated Nutritional Needs:   Kcal:  2000-2200  Protein:  100-110 grams  Fluid:  >/= 2 L/day  Tammy Parker, MS, RD, LDN RD pager number/after hours weekend pager number on Amion.

## 2020-06-08 LAB — CBC
HCT: 31.1 % — ABNORMAL LOW (ref 36.0–46.0)
Hemoglobin: 9.4 g/dL — ABNORMAL LOW (ref 12.0–15.0)
MCH: 26.8 pg (ref 26.0–34.0)
MCHC: 30.2 g/dL (ref 30.0–36.0)
MCV: 88.6 fL (ref 80.0–100.0)
Platelets: 242 10*3/uL (ref 150–400)
RBC: 3.51 MIL/uL — ABNORMAL LOW (ref 3.87–5.11)
RDW: 15.2 % (ref 11.5–15.5)
WBC: 6.5 10*3/uL (ref 4.0–10.5)
nRBC: 0 % (ref 0.0–0.2)

## 2020-06-08 LAB — GLUCOSE, CAPILLARY
Glucose-Capillary: 79 mg/dL (ref 70–99)
Glucose-Capillary: 81 mg/dL (ref 70–99)
Glucose-Capillary: 82 mg/dL (ref 70–99)
Glucose-Capillary: 86 mg/dL (ref 70–99)
Glucose-Capillary: 88 mg/dL (ref 70–99)

## 2020-06-08 LAB — BASIC METABOLIC PANEL
Anion gap: 11 (ref 5–15)
BUN: 14 mg/dL (ref 6–20)
CO2: 20 mmol/L — ABNORMAL LOW (ref 22–32)
Calcium: 8.3 mg/dL — ABNORMAL LOW (ref 8.9–10.3)
Chloride: 106 mmol/L (ref 98–111)
Creatinine, Ser: 0.8 mg/dL (ref 0.44–1.00)
GFR, Estimated: >60 mL/min (ref >60)
Glucose, Bld: 77 mg/dL (ref 70–99)
Potassium: 3.8 mmol/L (ref 3.5–5.1)
Sodium: 137 mmol/L (ref 135–145)

## 2020-06-08 LAB — APTT
aPTT: 46 seconds — ABNORMAL HIGH (ref 24–36)
aPTT: 66 seconds — ABNORMAL HIGH (ref 24–36)

## 2020-06-08 LAB — HEPARIN LEVEL (UNFRACTIONATED)
Heparin Unfractionated: 0.26 IU/mL — ABNORMAL LOW (ref 0.30–0.70)
Heparin Unfractionated: 0.37 IU/mL (ref 0.30–0.70)

## 2020-06-08 LAB — FERRITIN: Ferritin: 202 ng/mL (ref 11–307)

## 2020-06-08 LAB — LACTATE DEHYDROGENASE: LDH: 127 U/L (ref 98–192)

## 2020-06-08 LAB — MAGNESIUM: Magnesium: 1.8 mg/dL (ref 1.7–2.4)

## 2020-06-08 LAB — BRAIN NATRIURETIC PEPTIDE: B Natriuretic Peptide: 113.1 pg/mL — ABNORMAL HIGH (ref 0.0–100.0)

## 2020-06-08 LAB — C-REACTIVE PROTEIN: CRP: 4.6 mg/dL — ABNORMAL HIGH (ref <1.0)

## 2020-06-08 MED ORDER — MAGNESIUM SULFATE 2 GM/50ML IV SOLN
2.0000 g | Freq: Once | INTRAVENOUS | Status: AC
  Administered 2020-06-08: 2 g via INTRAVENOUS
  Filled 2020-06-08: qty 50

## 2020-06-08 MED ORDER — POTASSIUM CHLORIDE 10 MEQ/100ML IV SOLN
10.0000 meq | INTRAVENOUS | Status: AC
Start: 2020-06-08 — End: 2020-06-08
  Administered 2020-06-08 (×2): 10 meq via INTRAVENOUS
  Filled 2020-06-08 (×2): qty 100

## 2020-06-08 MED ORDER — BOOST / RESOURCE BREEZE PO LIQD CUSTOM
1.0000 | Freq: Three times a day (TID) | ORAL | Status: DC
  Administered 2020-06-08: 1 via ORAL

## 2020-06-08 MED ORDER — OXYCODONE HCL 5 MG PO TABS
5.0000 mg | ORAL_TABLET | ORAL | Status: DC | PRN
  Administered 2020-06-08: 10 mg via ORAL
  Administered 2020-06-08: 5 mg via ORAL
  Administered 2020-06-09 – 2020-06-17 (×5): 10 mg via ORAL
  Filled 2020-06-08 (×5): qty 2
  Filled 2020-06-08: qty 1
  Filled 2020-06-08 (×4): qty 2

## 2020-06-08 NOTE — Progress Notes (Addendum)
ANTICOAGULATION CONSULT NOTE - Follow Up Consult  Pharmacy Consult for IV Heparin Indication: atrial fibrillation  No Known Allergies  Patient Measurements: Height: 5\' 8"  (172.7 cm) Weight: 111.1 kg (244 lb 14.9 oz) IBW/kg (Calculated) : 63.9 Heparin Dosing Weight: 89 kg  Vital Signs: Temp: 98.2 F (36.8 C) (01/22 0430) Temp Source: Oral (01/22 0430) BP: 113/74 (01/22 0430) Pulse Rate: 94 (01/22 0430)  Labs: Recent Labs    06/05/20 2232 06/07/20 0603 06/07/20 0951 06/07/20 1958 06/07/20 2059 06/08/20 0400  HGB 11.8* 10.2*  --   --   --  9.4*  HCT 39.0 34.2*  --   --   --  31.1*  PLT 326 242  --   --   --  242  APTT  --   --  34  --  38* 46*  HEPARINUNFRC  --   --  0.31 0.28*  --  0.26*  CREATININE 0.95 0.88  --   --   --  0.80    Estimated Creatinine Clearance: 99 mL/min (by C-G formula based on SCr of 0.8 mg/dL).   Medical History: Past Medical History:  Diagnosis Date  . Dysrhythmia   . PAF (paroxysmal atrial fibrillation) (Christine)   . Tobacco abuse     Assessment: Pt was recently discharged from [facility] for diverticulitis with possible abscess. She is on chronic apixaban for afib (last dose on 1/19). She presented with worsening abd pain. Plan is to hold apixaban and bridge with IV heparin for possible intervention if needed. Pharmacy was consulted to dose IV heparin. Baseline aPTT and heparin level were 34 sec and 0.31 units/ml, respectively, indicating that apixaban is influencing heparin level. Given recent apixaban exposure, will monitor anticoagulation using aPTT until aPTT and heparin levels correlate.Heparin level this AM post 2250 unit bolus and dose titration is subtherapeutic at 0.26 and aPTT at 46. Overnight, nurse indicated patient did have some vaginal bleeding, but no note of continued bleeding at this time. Will not bolus at this time to avoid unwanted bleeding.   Goal of Therapy:  Heparin level 0.3-0.7 units/ml Monitor platelets by  anticoagulation protocol: Yes   Plan:  Increase heparin infusion to 1800 units/hr Check 6-hr heparin level, aPTT at 1430  Monitor daily heparin level, CBC Monitor for bleeding  Addendum: 6-hour HL is therapeutic at 0.37 and aPTT is 66 which are both at goal. Continue current regimen. No signs of bleeding.    Cephus Slater, PharmD, MBA Pharmacy Resident [phone] 06/08/2020 8:02 AM

## 2020-06-08 NOTE — Progress Notes (Signed)
PROGRESS NOTE    Tammy Davies  SWH:[id number] DOB: 20-Mar-1961 DOA: 06/05/2020 PCP: Patient, No Pcp Per   Brief Narrative:   60 year old with history of paroxysmal A. fib on anticoagulation, tobacco use, obstructive sleep apnea on CPAP, HTN admitted for abdominal pain which has been ongoing for the past month which previously was treated as constipation and then uncomplicated diverticulitis about a week ago with Augmentin.  She presents with worsening of her symptoms.  CT abdomen pelvis showed sigmoid diverticulitis with small abscess.  General surgery consulted and started on Zosyn.  Assessment & Plan:   Active Problems:   Acute diverticulitis  Acute complicated diverticulitis with small abscess - Pain pain control, conservative management - Diet per general surgery - General surgery following - No role for general surgery or IR drain at the moment but this may change - IV Zosyn D3   history of paroxysmal atrial fibrillation - On Cardizem.  Eliquis on hold. Currently on heparin drip - Monitor hemoglobin closely.  Essential hypertension -continue home Cardizem   DVT prophylaxis: Heparin drip Code Status: Full code Family Communication:    Status is: Inpatient  Remains inpatient appropriate because:Inpatient level of care appropriate due to severity of illness   Dispo: The patient is from: Home              Anticipated d/c is to: Home              Anticipated d/c date is: 2 days              Patient currently is not medically stable to d/c.  Complicated diverticulitis on IV antibiotics and heparin drip.    Body mass index is 37.24 kg/m.    Subjective: Feels little better. Denies abd pain and diarrhea.   Review of Systems Otherwise negative except as per HPI, including: General = no fevers, chills, dizziness,  fatigue HEENT/EYES = negative for loss of vision, double vision, blurred vision,  sore throa Cardiovascular= negative for chest pain,  palpitation Respiratory/lungs= negative for shortness of breath, cough, wheezing; hemoptysis,  Gastrointestinal= negative for nausea, vomiting, abdominal pain Genitourinary= negative for Dysuria MSK = Negative for arthralgia, myalgias Neurology= Negative for headache, numbness, tingling  Psychiatry= Negative for suicidal and homocidal ideation Skin= Negative for Rash   Examination: Constitutional: Not in acute distress Respiratory: Clear to auscultation bilaterally Cardiovascular: Normal sinus rhythm, no rubs Abdomen: Nontender nondistended good bowel sounds Musculoskeletal: No edema noted Skin: No rashes seen Neurologic: CN 2-12 grossly intact.  And nonfocal Psychiatric: Normal judgment and insight. Alert and oriented x 3. Normal mood.   Objective: Vitals:   06/07/20 1149 06/07/20 1427 06/07/20 2015 06/08/20 0430  BP: 103/71 96/81 106/67 113/74  Pulse: 95 80 95 94  Resp: 18 17 15 12   Temp: 98 F (36.7 C) 98.2 F (36.8 C) 98.2 F (36.8 C) 98.2 F (36.8 C)  TempSrc: Oral Oral Oral Oral  SpO2: 97% 98% 98% 98%  Weight:      Height:        Intake/Output Summary (Last 24 hours) at 06/08/2020 0825 Last data filed at 06/08/2020 0700 Gross per 24 hour  Intake 2731.61 ml  Output --  Net 2731.61 ml   Filed Weights   06/05/20 2219  Weight: 111.1 kg     Data Reviewed:   CBC: Recent Labs  Lab 06/05/20 2232 06/07/20 0603 06/08/20 0400  WBC 9.8 5.7 6.5  HGB 11.8* 10.2* 9.4*  HCT 39.0 34.2* 31.1*  MCV 86.3  87.9 88.6  PLT 326 242 371   Basic Metabolic Panel: Recent Labs  Lab 06/05/20 2232 06/07/20 0603 06/08/20 0400  NA 137 137 137  K 4.2 4.4 3.8  CL 102 103 106  CO2 22 22 20*  GLUCOSE 136* 102* 77  BUN 11 14 14   CREATININE 0.95 0.88 0.80  CALCIUM 9.3 8.8* 8.3*  MG  --   --  1.8   GFR: Estimated Creatinine Clearance: 99 mL/min (by C-G formula based on SCr of 0.8 mg/dL). Liver Function Tests: Recent Labs  Lab 06/05/20 2232 06/07/20 0603  AST 17 23   ALT 18 20  ALKPHOS 115 94  BILITOT 0.9 1.0  PROT 6.7 5.8*  ALBUMIN 3.3* 2.8*   No results for input(s): LIPASE, AMYLASE in the last 168 hours. No results for input(s): AMMONIA in the last 168 hours. Coagulation Profile: No results for input(s): INR, PROTIME in the last 168 hours. Cardiac Enzymes: No results for input(s): CKTOTAL, CKMB, CKMBINDEX, TROPONINI in the last 168 hours. BNP (last 3 results) No results for input(s): PROBNP in the last 8760 hours. HbA1C: Recent Labs    06/07/20 0603  HGBA1C 5.6   CBG: Recent Labs  Lab 06/08/20 0031 06/08/20 0620 06/08/20 0803  GLUCAP 82 81 79   Lipid Profile: No results for input(s): CHOL, HDL, LDLCALC, TRIG, CHOLHDL, LDLDIRECT in the last 72 hours. Thyroid Function Tests: Recent Labs    06/07/20 0951  TSH 5.348*   Anemia Panel: Recent Labs    06/08/20 0400  FERRITIN 202   Sepsis Labs: No results for input(s): PROCALCITON, LATICACIDVEN in the last 168 hours.  Recent Results (from the past 240 hour(s))  SARS CORONAVIRUS 2 (TAT 6-24 HRS) Nasopharyngeal Nasopharyngeal Swab     Status: Abnormal   Collection Time: 06/06/20  3:08 PM   Specimen: Nasopharyngeal Swab  Result Value Ref Range Status   SARS Coronavirus 2 POSITIVE (A) NEGATIVE Final    Comment: (NOTE) SARS-CoV-2 target nucleic acids are DETECTED.  The SARS-CoV-2 RNA is generally detectable in upper and lower respiratory specimens during the acute phase of infection. Positive results are indicative of the presence of SARS-CoV-2 RNA. Clinical correlation with patient history and other diagnostic information is  necessary to determine patient infection status. Positive results do not rule out bacterial infection or co-infection with other viruses.  The expected result is Negative.  Fact Sheet for Patients: SugarRoll.be  Fact Sheet for Healthcare Providers: https://www.woods-mathews.com/  This test is not yet approved  or cleared by the [place] FDA and  has been authorized for detection and/or diagnosis of SARS-CoV-2 by FDA under an Emergency Use Authorization (EUA). This EUA will remain  in effect (meaning this test can be used) for the duration of the COVID-19 declaration under Section 564(b)(1) of the Act, 21 U. S.C. section 360bbb-3(b)(1), unless the authorization is terminated or revoked sooner.   Performed at [facility], [place] [address], [place], [place] [address]          Radiology Studies: CT ABDOMEN PELVIS W CONTRAST  Result Date: 06/06/2020 CLINICAL DATA:  Diverticulitis, constipation, worsening pain EXAM: CT ABDOMEN AND PELVIS WITH CONTRAST TECHNIQUE: Multidetector CT imaging of the abdomen and pelvis was performed using the standard protocol following bolus administration of intravenous contrast. CONTRAST:  171m OMNIPAQUE IOHEXOL 300 MG/ML  SOLN COMPARISON:  CT abdomen pelvis, 05/29/2020, abdominal MR, 08/15/2016 FINDINGS: Lower chest: No acute abnormality. Hepatobiliary: No solid liver abnormality is seen.Redemonstrated large subcapsular hemangiomata, most conspicuously in  the inferior right lobe of the liver, hepatic segment VI, better characterized by prior MR (series 3, image 27). Status post cholecystectomy. No biliary ductal dilatation. Pancreas: Unremarkable. No pancreatic ductal dilatation or surrounding inflammatory changes. Spleen: Normal in size without significant abnormality. Adrenals/Urinary Tract: Adrenal glands are unremarkable. Kidneys are normal, without renal calculi, solid lesion, or hydronephrosis. Bladder is unremarkable. Stomach/Bowel: Stomach is within normal limits. Incidental diverticula of the descending portion of the duodenum. Appendix appears normal. Sigmoid diverticulosis with redemonstrated long segment wall thickening and adjacent fat stranding about the mid to distal sigmoid colon. Suspect a small phlegmon or abscess just inferior to the distal  sigmoid colon, closely abutting the posterior aspect of the lower uterine segment, measuring approximately 3.4 x 3.3 x 2.7 cm (series 3, image 74, series 7, image 76). Unchanged small volume of fluid posterior to the midportion of the sigmoid measuring approximately 3.4 x 1.7 cm (series 3, image 70). Vascular/Lymphatic: No significant vascular findings are present. No enlarged abdominal or pelvic lymph nodes. Reproductive: No mass or other significant abnormality. Other: No abdominal wall hernia or abnormality. No abdominopelvic ascites. Musculoskeletal: No acute or significant osseous findings. IMPRESSION: 1. Sigmoid diverticulosis with redemonstrated long segment wall thickening and adjacent fat stranding about the mid to distal sigmoid colon, consistent with acute diverticulitis. 2. Suspect a small phlegmon or abscess just inferior to the distal sigmoid colon, closely abutting the posterior aspect of the lower uterine segment, measuring approximately 3.4 x 3.3 x 2.7 cm, which is more defined appearing than on prior examination. 3. Unchanged small volume of fluid posterior to the midportion of the sigmoid, which is not clearly loculated. 4. 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 Electronically Signed   By: Eddie Candle M.D.   On: 06/06/2020 15:01        Scheduled Meds: . clotrimazole  1 Applicatorful Vaginal QHS  . diltiazem  120 mg Oral Daily  . Ipratropium-Albuterol  1 puff Inhalation Q6H   Continuous Infusions: . sodium chloride 100 mL/hr at 06/07/20 2204  . heparin 1,800 Units/hr (06/08/20 0807)  . piperacillin-tazobactam (ZOSYN)  IV 3.375 g (06/08/20 0615)     LOS: 2 days   Time spent= 35 mins    [place] Oborn Arsenio Loader, MD [facility]  If 7PM-7AM, please contact night-coverage  06/08/2020, 8:25 AM

## 2020-06-08 NOTE — Progress Notes (Signed)
[facility] Progress Note     Subjective: CC-  Continues to have some LLQ pain, but states that each day it is a little less. Tolerating sips of water. Denies n/v. No BM. WBC 6.5, afebrile  Objective: Vital signs in last 24 hours: Temp:  [98 F (36.7 C)-98.2 F (36.8 C)] 98.2 F (36.8 C) (01/22 0430) Pulse Rate:  [80-95] 94 (01/22 0430) Resp:  [12-18] 12 (01/22 0430) BP: (96-113)/(67-81) 113/74 (01/22 0430) SpO2:  [97 %-98 %] 98 % (01/22 0430) Last BM Date: 06/06/20  Intake/Output from previous day: 01/21 0701 - 01/22 0700 In: 2731.6 [P.O.:240; I.V.:2390.3; IV Piggyback:101.3] Out: -  Intake/Output this shift: No intake/output data recorded.  PE: n:  Alert, NAD, pleasant Card:  RRR Pulm:  Rate and effort normal Abd: Soft, ND, mild tenderness of the suprapubic and LLQ without peritonitis, +BS Ext:  No LE edema  Psych: A&Ox3  Skin: no rashes noted, warm and dry   Lab Results:  Recent Labs    06/07/20 0603 06/08/20 0400  WBC 5.7 6.5  HGB 10.2* 9.4*  HCT 34.2* 31.1*  PLT 242 242   BMET Recent Labs    06/07/20 0603 06/08/20 0400  NA 137 137  K 4.4 3.8  CL 103 106  CO2 22 20*  GLUCOSE 102* 77  BUN 14 14  CREATININE 0.88 0.80  CALCIUM 8.8* 8.3*   PT/INR No results for input(s): LABPROT, INR in the last 72 hours. CMP     Component Value Date/Time   NA 137 06/08/2020 0400   NA 141 07/19/2019 0840   K 3.8 06/08/2020 0400   CL 106 06/08/2020 0400   CO2 20 (L) 06/08/2020 0400   GLUCOSE 77 06/08/2020 0400   BUN 14 06/08/2020 0400   BUN 13 07/19/2019 0840   CREATININE 0.80 06/08/2020 0400   CREATININE 0.88 09/09/2016 1146   CALCIUM 8.3 (L) 06/08/2020 0400   PROT 5.8 (L) 06/07/2020 0603   ALBUMIN 2.8 (L) 06/07/2020 0603   AST 23 06/07/2020 0603   ALT 20 06/07/2020 0603   ALKPHOS 94 06/07/2020 0603   BILITOT 1.0 06/07/2020 0603   GFRNONAA >60 06/08/2020 0400   GFRNONAA 74 09/09/2016 1146   GFRAA 105 07/19/2019 0840   GFRAA 86  09/09/2016 1146   Lipase     Component Value Date/Time   LIPASE 25 05/29/2020 0951       Studies/Results: CT ABDOMEN PELVIS W CONTRAST  Result Date: 06/06/2020 CLINICAL DATA:  Diverticulitis, constipation, worsening pain EXAM: CT ABDOMEN AND PELVIS WITH CONTRAST TECHNIQUE: Multidetector CT imaging of the abdomen and pelvis was performed using the standard protocol following bolus administration of intravenous contrast. CONTRAST:  120m OMNIPAQUE IOHEXOL 300 MG/ML  SOLN COMPARISON:  CT abdomen pelvis, 05/29/2020, abdominal MR, 08/15/2016 FINDINGS: Lower chest: No acute abnormality. 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 Status post cholecystectomy. No biliary ductal dilatation. Pancreas: Unremarkable. No pancreatic ductal dilatation or surrounding inflammatory changes. Spleen: Normal in size without significant abnormality. Adrenals/Urinary Tract: Adrenal glands are unremarkable. Kidneys are normal, without renal calculi, solid lesion, or hydronephrosis. Bladder is unremarkable. Stomach/Bowel: Stomach is within normal limits. Incidental diverticula of the descending portion of the duodenum. Appendix appears normal. Sigmoid diverticulosis with redemonstrated long segment wall thickening and adjacent fat stranding about the mid to distal sigmoid colon. Suspect a small phlegmon or abscess  just inferior to the distal sigmoid colon, closely abutting the posterior aspect of the lower uterine segment, measuring approximately 3.4 x 3.3 x 2.7 cm (series 3, image 74, series 7, image 76). Unchanged small volume of fluid posterior to the midportion of the sigmoid measuring approximately 3.4 x 1.7 cm (series 3, image 70). Vascular/Lymphatic: No significant vascular findings are present. No enlarged abdominal or pelvic lymph  nodes. Reproductive: No mass or other significant abnormality. Other: No abdominal wall hernia or abnormality. No abdominopelvic ascites. Musculoskeletal: No acute or significant osseous findings. IMPRESSION: 1. Sigmoid diverticulosis with redemonstrated long segment wall thickening and adjacent fat stranding about the mid to distal sigmoid colon, consistent with acute diverticulitis. 2. Suspect a small phlegmon or abscess just inferior to the distal sigmoid colon, closely abutting the posterior aspect of the lower uterine segment, measuring approximately 3.4 x 3.3 x 2.7 cm, which is more defined appearing than on prior examination. 3. Unchanged small volume of fluid posterior to the midportion of the sigmoid, which is not clearly loculated. 4. 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 Electronically Signed   By: Eddie Candle M.D.   On: 06/06/2020 15:01    Anti-infectives: Anti-infectives (From admission, onward)   Start     Dose/Rate Route Frequency Ordered Stop   06/06/20 1850  piperacillin-tazobactam (ZOSYN) IVPB 3.375 g        3.375 g 12.5 mL/hr over 240 Minutes Intravenous Every 8 hours 06/06/20 1850     06/06/20 1830  piperacillin-tazobactam (ZOSYN) IVPB 3.375 g        3.375 g 100 mL/hr over 30 Minutes Intravenous  Once 06/06/20 1818 06/06/20 2039       Assessment/Plan Paroxysmal A. Fib on Eliquis at home  OSA HTN COVID +  Acute sigmoid diverticulitis with small phlegmon/ abscess in distal sigmoid colon - Initially dx w/ diverticulitis on 1/12 and d/c with Augmentin. Failed outpatient therapy. CT 1/20 showed sigmoid diverticulitis w/ small phlegmon vs abscess just inferior to distal sigmoid colon measuring 3.4 x 3.3 x 2.7 cm. Our team felt this was to small for IR drainage.  - If she was to worsen (increased abdominal pain, leukocytosis, fever etc) she may need  a repeat CT scan in a few days to determine if fluid collection is developing into a more formal abscess that would require IR drainage.  - Hopefully patient will improve with conservative tx. We discussed if she was to worsen and require surgery during admission, this would likely result in a colostomy.  - If patient improves with conservative tx and aviods surgery, would recommend a colonoscopy in 4-6 weeks. She denies hx of colonoscopy in the past  FEN - IVF, CLD, Boost breeze VTE - SCDs, heparin gtt (Eliquis on hold) ID - Zosyn 1/20>>  Plan: Pain improving and only mild TTP on exam. WBC WNL, afebrile. Advance to clear liquids. Continue IV zosyn. Repeat labs in AM.    LOS: 2 days    [place], [facility] 06/08/2020, 9:07 AM Please see Amion for pager number during day hours 7:00am-4:30pm

## 2020-06-09 DIAGNOSIS — I482 Chronic atrial fibrillation, unspecified: Secondary | ICD-10-CM

## 2020-06-09 LAB — C-REACTIVE PROTEIN: CRP: 5 mg/dL — ABNORMAL HIGH (ref <1.0)

## 2020-06-09 LAB — CBC
HCT: 30.7 % — ABNORMAL LOW (ref 36.0–46.0)
Hemoglobin: 9.5 g/dL — ABNORMAL LOW (ref 12.0–15.0)
MCH: 26.5 pg (ref 26.0–34.0)
MCHC: 30.9 g/dL (ref 30.0–36.0)
MCV: 85.8 fL (ref 80.0–100.0)
Platelets: 224 10*3/uL (ref 150–400)
RBC: 3.58 MIL/uL — ABNORMAL LOW (ref 3.87–5.11)
RDW: 15 % (ref 11.5–15.5)
WBC: 6.2 10*3/uL (ref 4.0–10.5)
nRBC: 0 % (ref 0.0–0.2)

## 2020-06-09 LAB — BASIC METABOLIC PANEL
Anion gap: 8 (ref 5–15)
BUN: 9 mg/dL (ref 6–20)
CO2: 24 mmol/L (ref 22–32)
Calcium: 8.5 mg/dL — ABNORMAL LOW (ref 8.9–10.3)
Chloride: 104 mmol/L (ref 98–111)
Creatinine, Ser: 0.77 mg/dL (ref 0.44–1.00)
GFR, Estimated: >60 mL/min (ref >60)
Glucose, Bld: 94 mg/dL (ref 70–99)
Potassium: 3.5 mmol/L (ref 3.5–5.1)
Sodium: 136 mmol/L (ref 135–145)

## 2020-06-09 LAB — GLUCOSE, CAPILLARY
Glucose-Capillary: 89 mg/dL (ref 70–99)
Glucose-Capillary: 92 mg/dL (ref 70–99)
Glucose-Capillary: 93 mg/dL (ref 70–99)

## 2020-06-09 LAB — HEPARIN LEVEL (UNFRACTIONATED): Heparin Unfractionated: 0.31 IU/mL (ref 0.30–0.70)

## 2020-06-09 LAB — MAGNESIUM: Magnesium: 1.9 mg/dL (ref 1.7–2.4)

## 2020-06-09 LAB — LACTATE DEHYDROGENASE: LDH: 117 U/L (ref 98–192)

## 2020-06-09 LAB — FERRITIN: Ferritin: 182 ng/mL (ref 11–307)

## 2020-06-09 LAB — APTT: aPTT: 74 seconds — ABNORMAL HIGH (ref 24–36)

## 2020-06-09 MED ORDER — DEXTROSE-NACL 5-0.45 % IV SOLN: INTRAVENOUS | Status: AC

## 2020-06-09 MED ORDER — POTASSIUM CHLORIDE 10 MEQ/100ML IV SOLN
10.0000 meq | INTRAVENOUS | Status: AC
  Administered 2020-06-09 (×4): 10 meq via INTRAVENOUS
  Filled 2020-06-09: qty 100

## 2020-06-09 MED ORDER — METOPROLOL TARTRATE 5 MG/5ML IV SOLN
5.0000 mg | INTRAVENOUS | Status: DC | PRN
  Administered 2020-06-10 – 2020-06-15 (×7): 5 mg via INTRAVENOUS
  Filled 2020-06-09 (×8): qty 5

## 2020-06-09 NOTE — Progress Notes (Signed)
ANTICOAGULATION CONSULT NOTE - Follow Up Consult  Pharmacy Consult for IV Heparin Indication: atrial fibrillation  No Known Allergies  Patient Measurements: Height: 5' 8"  (172.7 cm) Weight: 111.1 kg (244 lb 14.9 oz) IBW/kg (Calculated) : 63.9 Heparin Dosing Weight: 89 kg  Vital Signs: Temp: 98.2 F (36.8 C) (01/23 0626) Temp Source: Oral (01/23 0626) BP: 108/78 (01/23 0626) Pulse Rate: 59 (01/23 0626)  Labs: Recent Labs    06/07/20 0603 06/07/20 0951 06/08/20 0400 06/08/20 1429 06/09/20 0223  HGB 10.2*  --  9.4*  --  9.5*  HCT 34.2*  --  31.1*  --  30.7*  PLT 242  --  242  --  224  APTT  --    < > 46* 66* 74*  HEPARINUNFRC  --    < > 0.26* 0.37 0.31  CREATININE 0.88  --  0.80  --  0.77   < > = values in this interval not displayed.    Estimated Creatinine Clearance: 99 mL/min (by C-G formula based on SCr of 0.77 mg/dL).   Medical History: Past Medical History:  Diagnosis Date  . Dysrhythmia   . PAF (paroxysmal atrial fibrillation) ([place])   . Tobacco abuse     Assessment: Pt was recently discharged from [facility] for diverticulitis with possible abscess. She is on chronic apixaban for afib (last dose on 1/19). She presented with worsening abd pain. Plan is to hold apixaban and bridge with IV heparin for possible intervention if needed. Pharmacy was consulted to dose IV heparin. Patient has had vaginal bleeding during admission, but now bleeding noted at this time. HL this AM is at low end of goal at 0.31 and aPTT 74. Hgb is stable 9.5 and PLTs 224.   Goal of Therapy:  Heparin level 0.3-0.7 units/ml Monitor platelets by anticoagulation protocol: Yes   Plan:  Increase heparin infusion to 1850 units/hr Monitor daily heparin level, CBC Monitor for bleeding  Cephus Slater, PharmD, MBA Pharmacy Resident [phone] 06/09/2020 7:51 AM

## 2020-06-09 NOTE — Progress Notes (Signed)
PROGRESS NOTE    Tammy Davies  JOA:[id number] DOB: 05-May-1961 DOA: 06/05/2020 PCP: Patient, No Pcp Per   Brief Narrative:   60 year old with history of paroxysmal A. fib on anticoagulation, tobacco use, obstructive sleep apnea on CPAP, HTN admitted for abdominal pain which has been ongoing for the past month which previously was treated as constipation and then uncomplicated diverticulitis about a week ago with Augmentin.  She presents with worsening of her symptoms.  CT abdomen pelvis showed sigmoid diverticulitis with small abscess.  General surgery consulted and started on Zosyn.  Assessment & Plan:   Active Problems:   Acute diverticulitis  Acute complicated diverticulitis with small abscess - Pain pain control, conservative management - Advance diet per general surgery - Eventually will need repeat CT abdomen pelvis - No role for general surgery or IR drain at the moment but this may change - IV Zosyn D4  COVID-19 infection, asymptomatic - Asymptomatic.  Supportive care   history of paroxysmal atrial fibrillation - On Cardizem.  Eliquis on hold. Currently on heparin drip - Monitor hemoglobin closely.  Essential hypertension -continue home Cardizem   DVT prophylaxis: Heparin drip Code Status: Full code Family Communication:    Status is: Inpatient  Remains inpatient appropriate because:Inpatient level of care appropriate due to severity of illness   Dispo: The patient is from: Home              Anticipated d/c is to: Home              Anticipated d/c date is: 2 days              Patient currently is not medically stable to d/c.  Complicated diverticulitis on IV antibiotics and heparin drip.  Will discharge when cleared by general surgery    Body mass index is 37.24 kg/m.    Subjective: Limited right lower abdominal pain he upon examination but denies any complaints.  Review of Systems Otherwise negative except as per HPI, including: General: Denies  fever, chills, night sweats or unintended weight loss. Resp: Denies cough, wheezing, shortness of breath. Cardiac: Denies chest pain, palpitations, orthopnea, paroxysmal nocturnal dyspnea. GI: Denies nausea, vomiting, diarrhea or constipation GU: Denies dysuria, frequency, hesitancy or incontinence MS: Denies muscle aches, joint pain or swelling Neuro: Denies headache, neurologic deficits (focal weakness, numbness, tingling), abnormal gait Psych: Denies anxiety, depression, SI/HI/AVH Skin: Denies new rashes or lesions ID: Denies sick contacts, exotic exposures, travel   Examination: Constitutional: Not in acute distress Respiratory: Clear to auscultation bilaterally Cardiovascular: Normal sinus rhythm, no rubs Abdomen: Tenderness to palpation right lower quadrant nondistended good bowel sounds Musculoskeletal: No edema noted Skin: No rashes seen Neurologic: CN 2-12 grossly intact.  And nonfocal Psychiatric: Normal judgment and insight. Alert and oriented x 3. Normal mood.  Objective: Vitals:   06/08/20 0430 06/08/20 1300 06/08/20 2027 06/09/20 0626  BP: 113/74 105/87 117/68 108/78  Pulse: 94 100 (!) 52 (!) 59  Resp: 12 16 18 18   Temp: 98.2 F (36.8 C) 98.5 F (36.9 C) 98.7 F (37.1 C) 98.2 F (36.8 C)  TempSrc: Oral Oral Oral Oral  SpO2: 98% 93% 93% 95%  Weight:      Height:        Intake/Output Summary (Last 24 hours) at 06/09/2020 0818 Last data filed at 06/08/2020 2356 Gross per 24 hour  Intake 1395.87 ml  Output -  Net 1395.87 ml   Filed Weights   06/05/20 2219  Weight: 111.1 kg  Data Reviewed:   CBC: Recent Labs  Lab 06/05/20 2232 06/07/20 0603 06/08/20 0400 06/09/20 0223  WBC 9.8 5.7 6.5 6.2  HGB 11.8* 10.2* 9.4* 9.5*  HCT 39.0 34.2* 31.1* 30.7*  MCV 86.3 87.9 88.6 85.8  PLT 326 242 242 767   Basic Metabolic Panel: Recent Labs  Lab 06/05/20 2232 06/07/20 0603 06/08/20 0400 06/09/20 0223  NA 137 137 137 136  K 4.2 4.4 3.8 3.5  CL 102  103 106 104  CO2 22 22 20* 24  GLUCOSE 136* 102* 77 94  BUN 11 14 14 9   CREATININE 0.95 0.88 0.80 0.77  CALCIUM 9.3 8.8* 8.3* 8.5*  MG  --   --  1.8 1.9   GFR: Estimated Creatinine Clearance: 99 mL/min (by C-G formula based on SCr of 0.77 mg/dL). Liver Function Tests: Recent Labs  Lab 06/05/20 2232 06/07/20 0603  AST 17 23  ALT 18 20  ALKPHOS 115 94  BILITOT 0.9 1.0  PROT 6.7 5.8*  ALBUMIN 3.3* 2.8*   No results for input(s): LIPASE, AMYLASE in the last 168 hours. No results for input(s): AMMONIA in the last 168 hours. Coagulation Profile: No results for input(s): INR, PROTIME in the last 168 hours. Cardiac Enzymes: No results for input(s): CKTOTAL, CKMB, CKMBINDEX, TROPONINI in the last 168 hours. BNP (last 3 results) No results for input(s): PROBNP in the last 8760 hours. HbA1C: Recent Labs    06/07/20 0603  HGBA1C 5.6   CBG: Recent Labs  Lab 06/08/20 0803 06/08/20 1301 06/08/20 1729 06/09/20 0022 06/09/20 0628  GLUCAP 79 86 88 92 89   Lipid Profile: No results for input(s): CHOL, HDL, LDLCALC, TRIG, CHOLHDL, LDLDIRECT in the last 72 hours. Thyroid Function Tests: Recent Labs    06/07/20 0951  TSH 5.348*   Anemia Panel: Recent Labs    06/08/20 0400 06/09/20 0223  FERRITIN 202 182   Sepsis Labs: No results for input(s): PROCALCITON, LATICACIDVEN in the last 168 hours.  Recent Results (from the past 240 hour(s))  SARS CORONAVIRUS 2 (TAT 6-24 HRS) Nasopharyngeal Nasopharyngeal Swab     Status: Abnormal   Collection Time: 06/06/20  3:08 PM   Specimen: Nasopharyngeal Swab  Result Value Ref Range Status   SARS Coronavirus 2 POSITIVE (A) NEGATIVE Final    Comment: (NOTE) SARS-CoV-2 target nucleic acids are DETECTED.  The SARS-CoV-2 RNA is generally detectable in upper and lower respiratory specimens during the acute phase of infection. Positive results are indicative of the presence of SARS-CoV-2 RNA. Clinical correlation with patient history and  other diagnostic information is  necessary to determine patient infection status. Positive results do not rule out bacterial infection or co-infection with other viruses.  The expected result is Negative.  Fact Sheet for Patients: SugarRoll.be  Fact Sheet for Healthcare Providers: https://www.woods-mathews.com/  This test is not yet approved or cleared by the [place] FDA and  has been authorized for detection and/or diagnosis of SARS-CoV-2 by FDA under an Emergency Use Authorization (EUA). This EUA will remain  in effect (meaning this test can be used) for the duration of the COVID-19 declaration under Section 564(b)(1) of the Act, 21 U. S.C. section 360bbb-3(b)(1), unless the authorization is terminated or revoked sooner.   Performed at [facility], [place] [address], [place], [place] [address]          Radiology Studies: No results found.      Scheduled Meds: . diltiazem  120 mg Oral Daily  . feeding supplement  1 Container Oral TID BM  . Ipratropium-Albuterol  1 puff Inhalation Q6H   Continuous Infusions: . heparin 1,800 Units/hr (06/09/20 0332)  . piperacillin-tazobactam (ZOSYN)  IV 3.375 g (06/09/20 0514)     LOS: 3 days   Time spent= 35 mins    Ankit Arsenio Loader, MD [facility]  If 7PM-7AM, please contact night-coverage  06/09/2020, 8:18 AM

## 2020-06-09 NOTE — Progress Notes (Signed)
[facility] Progress Note     Subjective: CC-  Overall feeling better, but still requiring some pain medication for intermittent LLQ abdominal pains. States that the pain is less frequently. Denies n/v. Tolerating clear liquids without increased pain. Loose BM x2 yesterday. WBC 6.2, afebrile  Objective: Vital signs in last 24 hours: Temp:  [98.2 F (36.8 C)-98.7 F (37.1 C)] 98.2 F (36.8 C) (01/23 0626) Pulse Rate:  [52-100] 59 (01/23 0626) Resp:  [16-18] 18 (01/23 0626) BP: (105-117)/(68-87) 108/78 (01/23 0626) SpO2:  [93 %-95 %] 95 % (01/23 0626) Last BM Date: 06/06/20  Intake/Output from previous day: 01/22 0701 - 01/23 0700 In: 1395.9 [P.O.:720; I.V.:297.1; IV Piggyback:378.8] Out: -  Intake/Output this shift: No intake/output data recorded.  PE: Gen: Alert, NAD, pleasant Card: tachy Pulm:Rate and effort normal Abd: Soft,ND, minimal tenderness of the suprapubic and LLQ without peritonitis,+BS Ext: No LE edema Psych: A&Ox3  Skin: no rashes noted, warm and dry   Lab Results:  Recent Labs    06/08/20 0400 06/09/20 0223  WBC 6.5 6.2  HGB 9.4* 9.5*  HCT 31.1* 30.7*  PLT 242 224   BMET Recent Labs    06/08/20 0400 06/09/20 0223  NA 137 136  K 3.8 3.5  CL 106 104  CO2 20* 24  GLUCOSE 77 94  BUN 14 9  CREATININE 0.80 0.77  CALCIUM 8.3* 8.5*   PT/INR No results for input(s): LABPROT, INR in the last 72 hours. CMP     Component Value Date/Time   NA 136 06/09/2020 0223   NA 141 07/19/2019 0840   K 3.5 06/09/2020 0223   CL 104 06/09/2020 0223   CO2 24 06/09/2020 0223   GLUCOSE 94 06/09/2020 0223   BUN 9 06/09/2020 0223   BUN 13 07/19/2019 0840   CREATININE 0.77 06/09/2020 0223   CREATININE 0.88 09/09/2016 1146   CALCIUM 8.5 (L) 06/09/2020 0223   PROT 5.8 (L) 06/07/2020 0603   ALBUMIN 2.8 (L) 06/07/2020 0603   AST 23 06/07/2020 0603   ALT 20 06/07/2020 0603   ALKPHOS 94 06/07/2020 0603   BILITOT 1.0 06/07/2020 0603    GFRNONAA >60 06/09/2020 0223   GFRNONAA 74 09/09/2016 1146   GFRAA 105 07/19/2019 0840   GFRAA 86 09/09/2016 1146   Lipase     Component Value Date/Time   LIPASE 25 05/29/2020 0951       Studies/Results: No results found.  Anti-infectives: Anti-infectives (From admission, onward)   Start     Dose/Rate Route Frequency Ordered Stop   06/06/20 1850  piperacillin-tazobactam (ZOSYN) IVPB 3.375 g        3.375 g 12.5 mL/hr over 240 Minutes Intravenous Every 8 hours 06/06/20 1850     06/06/20 1830  piperacillin-tazobactam (ZOSYN) IVPB 3.375 g        3.375 g 100 mL/hr over 30 Minutes Intravenous  Once 06/06/20 1818 06/06/20 2039       Assessment/Plan Paroxysmal A. Fib on Eliquis at home  OSA HTN COVID +  Acute sigmoid diverticulitis with small phlegmon/ abscess in distal sigmoid colon - Initially dx w/ diverticulitis on 1/12 and d/c with Augmentin. Failed outpatient therapy. CT 1/20 showed sigmoid diverticulitis w/ small phlegmon vs abscess just inferior to distal sigmoid colon measuring3.4 x 3.3 x 2.7 cm. Our team felt this was to small for IR drainage.  - If she was to worsen (increased abdominal pain, leukocytosis, fever etc) she may need a repeat CT scan in a few days to  determine if fluid collection is developing into a more formal abscess that would require IR drainage.  - Hopefully patient will improve with conservative tx. We discussed if she was to worsen and require surgery during admission, this would likely result in a colostomy.  - If patient improves with conservative tx and aviods surgery, would recommend a colonoscopy in 4-6 weeks. She denies hx of colonoscopy in the past  FEN -IVF, FLD, Boost breeze VTE -SCDs, heparin gtt (Eliquis on hold) ID -Zosyn 1/20>>  Plan: Advance to full liquids. Continue IV zosyn. Repeat labs in AM. If she continues to have LLQ pain will likely plan for repeat CT in 1-2 days.   LOS: 3 days    [facility] 06/09/2020, 11:13 AM Please see Amion for pager number during day hours 7:00am-4:30pm

## 2020-06-09 NOTE — Progress Notes (Signed)
Pharmacy Antibiotic Note  Tammy Davies is a 60 y.o. female admitted on 06/05/2020 with diverticulitis w/ abscess.  Pharmacy has been consulted for Zosyn dosing. Surgery is treating with conservative therapy at this time, but patient may require surgery if clinically worsens. Patient remains afebrile and WBC wnl. CT 1/20 showed sigmoid diverticulitis w small abscess, too small for IR drainage per surgery.    Plan: Zosyn 3.375g IV every 8 hours (extended infusion) Monitor renal function, clinical progression and surgery plans for LOT  Height: 5\' 8"  (172.7 cm) Weight: 111.1 kg (244 lb 14.9 oz) IBW/kg (Calculated) : 63.9  Temp (24hrs), Avg:98.5 F (36.9 C), Min:98.2 F (36.8 C), Max:98.7 F (37.1 C)  Recent Labs  Lab 06/05/20 2232 06/07/20 0603 06/08/20 0400 06/09/20 0223  WBC 9.8 5.7 6.5 6.2  CREATININE 0.95 0.88 0.80 0.77    Estimated Creatinine Clearance: 99 mL/min (by C-G formula based on SCr of 0.77 mg/dL).    No Known Allergies  Cephus Slater, PharmD, [facility] Pharmacy Resident [phone] 06/09/2020 8:03 AM

## 2020-06-10 ENCOUNTER — Inpatient Hospital Stay (HOSPITAL_COMMUNITY): Payer: No Typology Code available for payment source

## 2020-06-10 LAB — GLUCOSE, CAPILLARY
Glucose-Capillary: 103 mg/dL — ABNORMAL HIGH (ref 70–99)
Glucose-Capillary: 114 mg/dL — ABNORMAL HIGH (ref 70–99)
Glucose-Capillary: 115 mg/dL — ABNORMAL HIGH (ref 70–99)
Glucose-Capillary: 89 mg/dL (ref 70–99)

## 2020-06-10 LAB — CBC
HCT: 30.7 % — ABNORMAL LOW (ref 36.0–46.0)
Hemoglobin: 9.6 g/dL — ABNORMAL LOW (ref 12.0–15.0)
MCH: 27 pg (ref 26.0–34.0)
MCHC: 31.3 g/dL (ref 30.0–36.0)
MCV: 86.2 fL (ref 80.0–100.0)
Platelets: 230 10*3/uL (ref 150–400)
RBC: 3.56 MIL/uL — ABNORMAL LOW (ref 3.87–5.11)
RDW: 15.1 % (ref 11.5–15.5)
WBC: 5.3 10*3/uL (ref 4.0–10.5)
nRBC: 0 % (ref 0.0–0.2)

## 2020-06-10 LAB — MAGNESIUM: Magnesium: 1.9 mg/dL (ref 1.7–2.4)

## 2020-06-10 LAB — BASIC METABOLIC PANEL
Anion gap: 8 (ref 5–15)
BUN: 7 mg/dL (ref 6–20)
CO2: 23 mmol/L (ref 22–32)
Calcium: 8.6 mg/dL — ABNORMAL LOW (ref 8.9–10.3)
Chloride: 105 mmol/L (ref 98–111)
Creatinine, Ser: 0.78 mg/dL (ref 0.44–1.00)
GFR, Estimated: >60 mL/min (ref >60)
Glucose, Bld: 110 mg/dL — ABNORMAL HIGH (ref 70–99)
Potassium: 3.6 mmol/L (ref 3.5–5.1)
Sodium: 136 mmol/L (ref 135–145)

## 2020-06-10 LAB — HEPARIN LEVEL (UNFRACTIONATED)
Heparin Unfractionated: 0.26 IU/mL — ABNORMAL LOW (ref 0.30–0.70)
Heparin Unfractionated: 0.2 IU/mL — ABNORMAL LOW (ref 0.30–0.70)
Heparin Unfractionated: 0.49 IU/mL (ref 0.30–0.70)

## 2020-06-10 LAB — C-REACTIVE PROTEIN: CRP: 4.7 mg/dL — ABNORMAL HIGH (ref <1.0)

## 2020-06-10 LAB — LACTATE DEHYDROGENASE: LDH: 115 U/L (ref 98–192)

## 2020-06-10 LAB — FERRITIN: Ferritin: 144 ng/mL (ref 11–307)

## 2020-06-10 MED ORDER — IPRATROPIUM-ALBUTEROL 20-100 MCG/ACT IN AERS
1.0000 | INHALATION_SPRAY | Freq: Four times a day (QID) | RESPIRATORY_TRACT | Status: DC | PRN
  Administered 2020-06-10 – 2020-06-12 (×4): 1 via RESPIRATORY_TRACT

## 2020-06-10 MED ORDER — BISACODYL 10 MG RE SUPP
10.0000 mg | Freq: Every day | RECTAL | Status: DC | PRN
  Administered 2020-06-12: 10 mg via RECTAL
  Filled 2020-06-10: qty 1

## 2020-06-10 MED ORDER — IOHEXOL 9 MG/ML PO SOLN
500.0000 mL | ORAL | Status: AC
  Administered 2020-06-10 (×2): 500 mL via ORAL

## 2020-06-10 MED ORDER — POLYETHYLENE GLYCOL 3350 17 G PO PACK
17.0000 g | PACK | Freq: Two times a day (BID) | ORAL | Status: DC
  Administered 2020-06-10 – 2020-06-17 (×9): 17 g via ORAL
  Filled 2020-06-10 (×12): qty 1

## 2020-06-10 MED ORDER — IOHEXOL 300 MG/ML  SOLN
100.0000 mL | Freq: Once | INTRAMUSCULAR | Status: AC | PRN
  Administered 2020-06-10: 100 mL via INTRAVENOUS

## 2020-06-10 MED ORDER — POTASSIUM CHLORIDE 10 MEQ/100ML IV SOLN
10.0000 meq | INTRAVENOUS | Status: AC
  Administered 2020-06-10 (×4): 10 meq via INTRAVENOUS
  Filled 2020-06-10: qty 100

## 2020-06-10 NOTE — Progress Notes (Signed)
ANTICOAGULATION CONSULT NOTE - Follow Up Consult  Pharmacy Consult for IV Heparin Indication: atrial fibrillation  No Known Allergies  Patient Measurements: Height: 5' 8"  (172.7 cm) Weight: 111.1 kg (244 lb 14.9 oz) IBW/kg (Calculated) : 63.9 Heparin Dosing Weight: 89 kg  Vital Signs: Temp: 98.3 F (36.8 C) (01/24 0625) Temp Source: Oral (01/24 0625) BP: 110/82 (01/24 0625) Pulse Rate: 100 (01/24 0625)  Labs: Recent Labs    06/08/20 0400 06/08/20 1429 06/09/20 0223 06/10/20 0026 06/10/20 0937  HGB 9.4*  --  9.5* 9.6*  --   HCT 31.1*  --  30.7* 30.7*  --   PLT 242  --  224 230  --   APTT 46* 66* 74*  --   --   HEPARINUNFRC 0.26* 0.37 0.31 0.26* 0.20*  CREATININE 0.80  --  0.77 0.78  --     Estimated Creatinine Clearance: 99 mL/min (by C-G formula based on SCr of 0.78 mg/dL).   Medical History: Past Medical History:  Diagnosis Date  . Dysrhythmia   . PAF (paroxysmal atrial fibrillation) ([place])   . Tobacco abuse     Assessment: Pt was recently discharged from [facility] for diverticulitis with possible abscess. She is on chronic apixaban for afib (last dose on 1/19). She presented with worsening abd pain. Plan is to hold apixaban and bridge with IV heparin for possible intervention if needed. Pharmacy was consulted to dose IV heparin. Patient has had vaginal bleeding during admission, but now bleeding noted at this time. HL this AM is below goal at 0.20. Hgb is stable 9.6 and PLTs 230.   Goal of Therapy:  Heparin level 0.3-0.7 units/ml Monitor platelets by anticoagulation protocol: Yes   Plan:  Increase heparin infusion to 2250 units/hr HL in 6 hours Monitor daily heparin level, CBC Monitor for bleeding  Mase Dhondt A. Levada Dy, PharmD, BCPS, FNKF Clinical Pharmacist [place] Please utilize Amion for appropriate phone number to reach the unit pharmacist ([place])   06/10/2020 11:38 AM

## 2020-06-10 NOTE — Progress Notes (Signed)
[facility] Progress Note     Subjective: CC-  Continues to have some LLQ pain, but does think it is improved from yesterday. Still requiring pain medication. Denies n/v. Tolerating full liquids. Last BM 2 days ago.  Objective: Vital signs in last 24 hours: Temp:  [97.9 F (36.6 C)-98.3 F (36.8 C)] 98.3 F (36.8 C) (01/24 0625) Pulse Rate:  [97-100] 100 (01/24 0625) Resp:  [16-20] 20 (01/24 0710) BP: (110-116)/(79-82) 110/82 (01/24 0625) SpO2:  [91 %-95 %] 95 % (01/24 0625) Last BM Date: 06/08/20  Intake/Output from previous day: 01/23 0701 - 01/24 0700 In: 1335.4 [P.O.:600; I.V.:535.3; IV Piggyback:200.1] Out: -  Intake/Output this shift: Total I/O In: 360 [P.O.:360] Out: -    PE: Gen: Alert, NAD, pleasant Card: tachy Pulm:Rate and effort normal Abd: Soft,ND, minimal tenderness of the suprapubic and LLQ without peritonitis,+BS Ext: No LE edema Psych: A&Ox3  Skin: no rashes noted, warm and dry  Lab Results:  Recent Labs    06/09/20 0223 06/10/20 0026  WBC 6.2 5.3  HGB 9.5* 9.6*  HCT 30.7* 30.7*  PLT 224 230   BMET Recent Labs    06/09/20 0223 06/10/20 0026  NA 136 136  K 3.5 3.6  CL 104 105  CO2 24 23  GLUCOSE 94 110*  BUN 9 7  CREATININE 0.77 0.78  CALCIUM 8.5* 8.6*   PT/INR No results for input(s): LABPROT, INR in the last 72 hours. CMP     Component Value Date/Time   NA 136 06/10/2020 0026   NA 141 07/19/2019 0840   K 3.6 06/10/2020 0026   CL 105 06/10/2020 0026   CO2 23 06/10/2020 0026   GLUCOSE 110 (H) 06/10/2020 0026   BUN 7 06/10/2020 0026   BUN 13 07/19/2019 0840   CREATININE 0.78 06/10/2020 0026   CREATININE 0.88 09/09/2016 1146   CALCIUM 8.6 (L) 06/10/2020 0026   PROT 5.8 (L) 06/07/2020 0603   ALBUMIN 2.8 (L) 06/07/2020 0603   AST 23 06/07/2020 0603   ALT 20 06/07/2020 0603   ALKPHOS 94 06/07/2020 0603   BILITOT 1.0 06/07/2020 0603   GFRNONAA >60 06/10/2020 0026   GFRNONAA 74 09/09/2016 1146   GFRAA  105 07/19/2019 0840   GFRAA 86 09/09/2016 1146   Lipase     Component Value Date/Time   LIPASE 25 05/29/2020 0951       Studies/Results: No results found.  Anti-infectives: Anti-infectives (From admission, onward)   Start     Dose/Rate Route Frequency Ordered Stop   06/06/20 1850  piperacillin-tazobactam (ZOSYN) IVPB 3.375 g        3.375 g 12.5 mL/hr over 240 Minutes Intravenous Every 8 hours 06/06/20 1850     06/06/20 1830  piperacillin-tazobactam (ZOSYN) IVPB 3.375 g        3.375 g 100 mL/hr over 30 Minutes Intravenous  Once 06/06/20 1818 06/06/20 2039       Assessment/Plan Paroxysmal A. Fib on Eliquis at home  OSA HTN COVID +  Acute sigmoid diverticulitis with small phlegmon/ abscess in distal sigmoid colon - Initially dx w/ diverticulitis on 1/12 and d/c with Augmentin. Failed outpatient therapy. CT 1/20 showed sigmoid diverticulitis w/ small phlegmon vs abscess just inferior to distal sigmoid colon measuring3.4 x 3.3 x 2.7 cm. Our team felt this was to small for IR drainage.  - Hopefully patient will improve with conservative tx. We discussed if she was to worsen and require surgery during admission, this would likely result in a colostomy.  -  If patient improves with conservative tx and aviods surgery, would recommend a colonoscopy in 4-6 weeks. She denies hx of colonoscopy in the past  FEN -IVF, FLD, Boost breeze VTE -SCDs, heparin gtt (Eliquis on hold) ID -Zosyn1/20>>  Plan: Pain improving but not resolved so I will repeat CT scan today to determine if fluid collection is developing into a more formal abscess that would require IR drainage.    LOS: 4 days    [facility] 06/10/2020, 9:15 AM Please see Amion for pager number during day hours 7:00am-4:30pm

## 2020-06-10 NOTE — Progress Notes (Addendum)
   06/10/20 1658  Assess: MEWS Score  Temp 98.1 F (36.7 C)  BP (!) 146/109  Pulse Rate (!) 124  Resp (!) 22  Level of Consciousness Alert  SpO2 (!) 84 %  O2 Device Room Air  Patient Activity (if Appropriate) In bed  Assess: MEWS Score  MEWS Temp 0  MEWS Systolic 0  MEWS Pulse 2  MEWS RR 1  MEWS LOC 0  MEWS Score 3  MEWS Score Color Yellow  Assess: if the MEWS score is Yellow or Red  Were vital signs taken at a resting state? Yes  Focused Assessment Change from prior assessment (see assessment flowsheet)  Early Detection of Sepsis Score *See Row Information* Low  MEWS guidelines implemented *See Row Information* Yes  Treat  MEWS Interventions Escalated (See documentation below)  Pain Scale 0-10  Pain Score 0  Take Vital Signs  Increase Vital Sign Frequency  Yellow: Q 2hr X 2 then Q 4hr X 2, if remains yellow, continue Q 4hrs  Escalate  MEWS: Escalate Yellow: discuss with charge nurse/RN and consider discussing with provider and RRT  Notify: Charge Nurse/RN  Name of Charge Nurse/RN Notified Sarah  Date Charge Nurse/RN Notified 06/10/20  Time Charge Nurse/RN Notified 1704  Notify: Provider  Provider Name/Title Dr. Arsenio Loader  Date Provider Notified 06/10/20  Time Provider Notified 1702  Notification Type Page  Notification Reason Change in status  Response See new orders  Date of Provider Response 06/10/20  Time of Provider Response 1702  Document  Patient Outcome Stabilized after interventions  Progress note created (see row info) Yes   Continuous IVF discontinued d/t new pleural effusions seen on abd CT scan and new basal crackles. Metoprolol Prn given for increased HR and BP. PRN cmbivent given for SOB.

## 2020-06-10 NOTE — Progress Notes (Signed)
ANTICOAGULATION CONSULT NOTE - Follow Up Consult  Pharmacy Consult for heparin Indication: atrial fibrillation  Labs: Recent Labs    06/08/20 0400 06/08/20 1429 06/09/20 0223 06/10/20 0026  HGB 9.4*  --  9.5* 9.6*  HCT 31.1*  --  30.7* 30.7*  PLT 242  --  224 230  APTT 46* 66* 74*  --   HEPARINUNFRC 0.26* 0.37 0.31 0.26*  CREATININE 0.80  --  0.77 0.78    Assessment: 59yo female subtherapeutic on heparin with lower heparin level despite increased rate, likely d/t Eliquis clearing; no gtt issues or signs of bleeding per RN.  Goal of Therapy:  Heparin level 0.3-0.7 units/ml   Plan:  Will increase heparin gtt by 1-2 units/kg/hr to 2000 units/hr and check level in 6 hours.    Wynona Neat, PharmD, BCPS  06/10/2020,2:40 AM

## 2020-06-10 NOTE — Progress Notes (Signed)
PROGRESS NOTE    Tammy Davies  GYK:[id number] DOB: [birth year] DOA: 06/05/2020 PCP: Patient, No Pcp Per   Brief Narrative:   60 year old with history of paroxysmal A. fib on anticoagulation, tobacco use, obstructive sleep apnea on CPAP, HTN admitted for abdominal pain which has been ongoing for the past month which previously was treated as constipation and then uncomplicated diverticulitis about a week ago with Augmentin.  She presents with worsening of her symptoms.  CT abdomen pelvis showed sigmoid diverticulitis with small abscess.  General surgery consulted and started on Zosyn.  Assessment & Plan:   Active Problems:   Acute diverticulitis  Acute complicated diverticulitis with small abscess - Pain pain control, conservative management - Advance diet per general surgery -Repeat CT abdomen pelvis - No role for general surgery or IR drain at the moment but this may change - IV Zosyn D5 -We will need colonoscopy in about 6 weeks  COVID-19 infection, asymptomatic - Asymptomatic.  Supportive care   history of paroxysmal atrial fibrillation - On Cardizem.  Eliquis on hold. Currently on heparin drip - Monitor hemoglobin closely.  Essential hypertension -continue home Cardizem   DVT prophylaxis: Heparin drip Code Status: Full code Family Communication:    Status is: Inpatient  Remains inpatient appropriate because:Inpatient level of care appropriate due to severity of illness   Dispo: The patient is from: Home              Anticipated d/c is to: Home              Anticipated d/c date is: 2 days              Patient currently is not medically stable to d/c.  Maintain hospital stay for complicated diverticulitis on IV antibiotics and heparin drip.  Discharge once cleared by general surgery.   Body mass index is 37.24 kg/m.    Subjective: Feels better no complaints.  Has not passed much gas or had a bowel movement.  Review of Systems Otherwise negative except  as per HPI, including: General: Denies fever, chills, night sweats or unintended weight loss. Resp: Denies cough, wheezing, shortness of breath. Cardiac: Denies chest pain, palpitations, orthopnea, paroxysmal nocturnal dyspnea. GI: Denies abdominal pain, nausea, vomiting, diarrhea or constipation GU: Denies dysuria, frequency, hesitancy or incontinence MS: Denies muscle aches, joint pain or swelling Neuro: Denies headache, neurologic deficits (focal weakness, numbness, tingling), abnormal gait Psych: Denies anxiety, depression, SI/HI/AVH Skin: Denies new rashes or lesions ID: Denies sick contacts, exotic exposures, travel   Examination: Constitutional: Not in acute distress Respiratory: Clear to auscultation bilaterally Cardiovascular: Normal sinus rhythm, no rubs Abdomen: Nontender nondistended good bowel sounds Musculoskeletal: No edema noted Skin: No rashes seen Neurologic: CN 2-12 grossly intact.  And nonfocal Psychiatric: Normal judgment and insight. Alert and oriented x 3. Normal mood.  Objective: Vitals:   06/09/20 1657 06/09/20 1750 06/10/20 0625 06/10/20 0710  BP:   110/82   Pulse:   100   Resp: 20 20 20 20   Temp:   98.3 F (36.8 C)   TempSrc:   Oral   SpO2:   95%   Weight:      Height:        Intake/Output Summary (Last 24 hours) at 06/10/2020 1131 Last data filed at 06/10/2020 0817 Gross per 24 hour  Intake 1095.38 ml  Output -  Net 1095.38 ml   Filed Weights   06/05/20 2219  Weight: 111.1 kg     Data Reviewed:  CBC: Recent Labs  Lab 06/05/20 2232 06/07/20 0603 06/08/20 0400 06/09/20 0223 06/10/20 0026  WBC 9.8 5.7 6.5 6.2 5.3  HGB 11.8* 10.2* 9.4* 9.5* 9.6*  HCT 39.0 34.2* 31.1* 30.7* 30.7*  MCV 86.3 87.9 88.6 85.8 86.2  PLT 326 242 242 224 664   Basic Metabolic Panel: Recent Labs  Lab 06/05/20 2232 06/07/20 0603 06/08/20 0400 06/09/20 0223 06/10/20 0026  NA 137 137 137 136 136  K 4.2 4.4 3.8 3.5 3.6  CL 102 103 106 104 105  CO2  22 22 20* 24 23  GLUCOSE 136* 102* 77 94 110*  BUN 11 14 14 9 7   CREATININE 0.95 0.88 0.80 0.77 0.78  CALCIUM 9.3 8.8* 8.3* 8.5* 8.6*  MG  --   --  1.8 1.9 1.9   GFR: Estimated Creatinine Clearance: 99 mL/min (by C-G formula based on SCr of 0.78 mg/dL). Liver Function Tests: Recent Labs  Lab 06/05/20 2232 06/07/20 0603  AST 17 23  ALT 18 20  ALKPHOS 115 94  BILITOT 0.9 1.0  PROT 6.7 5.8*  ALBUMIN 3.3* 2.8*   No results for input(s): LIPASE, AMYLASE in the last 168 hours. No results for input(s): AMMONIA in the last 168 hours. Coagulation Profile: No results for input(s): INR, PROTIME in the last 168 hours. Cardiac Enzymes: No results for input(s): CKTOTAL, CKMB, CKMBINDEX, TROPONINI in the last 168 hours. BNP (last 3 results) No results for input(s): PROBNP in the last 8760 hours. HbA1C: No results for input(s): HGBA1C in the last 72 hours. CBG: Recent Labs  Lab 06/09/20 0022 06/09/20 0628 06/09/20 1217 06/10/20 0033 06/10/20 0622  GLUCAP 92 89 93 114* 115*   Lipid Profile: No results for input(s): CHOL, HDL, LDLCALC, TRIG, CHOLHDL, LDLDIRECT in the last 72 hours. Thyroid Function Tests: No results for input(s): TSH, T4TOTAL, FREET4, T3FREE, THYROIDAB in the last 72 hours. Anemia Panel: Recent Labs    06/09/20 0223 06/10/20 0026  FERRITIN 182 144   Sepsis Labs: No results for input(s): PROCALCITON, LATICACIDVEN in the last 168 hours.  Recent Results (from the past 240 hour(s))  SARS CORONAVIRUS 2 (TAT 6-24 HRS) Nasopharyngeal Nasopharyngeal Swab     Status: Abnormal   Collection Time: 06/06/20  3:08 PM   Specimen: Nasopharyngeal Swab  Result Value Ref Range Status   SARS Coronavirus 2 POSITIVE (A) NEGATIVE Final    Comment: (NOTE) SARS-CoV-2 target nucleic acids are DETECTED.  The SARS-CoV-2 RNA is generally detectable in upper and lower respiratory specimens during the acute phase of infection. Positive results are indicative of the presence of  SARS-CoV-2 RNA. Clinical correlation with patient history and other diagnostic information is  necessary to determine patient infection status. Positive results do not rule out bacterial infection or co-infection with other viruses.  The expected result is Negative.  Fact Sheet for Patients: SugarRoll.be  Fact Sheet for Healthcare Providers: https://www.woods-mathews.com/  This test is not yet approved or cleared by the [place] FDA and  has been authorized for detection and/or diagnosis of SARS-CoV-2 by FDA under an Emergency Use Authorization (EUA). This EUA will remain  in effect (meaning this test can be used) for the duration of the COVID-19 declaration under Section 564(b)(1) of the Act, 21 U. S.C. section 360bbb-3(b)(1), unless the authorization is terminated or revoked sooner.   Performed at [facility], [place] [address], [place], [place] [address]          Radiology Studies: No results found.  Scheduled Meds: . diltiazem  120 mg Oral Daily  . feeding supplement  1 Container Oral TID BM  . iohexol  500 mL Oral Q1H  . Ipratropium-Albuterol  1 puff Inhalation Q6H   Continuous Infusions: . dextrose 5 % and 0.45% NaCl 75 mL/hr at 06/10/20 0817  . heparin 2,000 Units/hr (06/10/20 0600)  . piperacillin-tazobactam (ZOSYN)  IV 3.375 g (06/10/20 2703)  . potassium chloride 10 mEq (06/10/20 1051)     LOS: 4 days   Time spent= 35 mins    Saanya Zieske Arsenio Loader, MD [facility]  If 7PM-7AM, please contact night-coverage  06/10/2020, 11:31 AM

## 2020-06-10 NOTE — Progress Notes (Signed)
ANTICOAGULATION CONSULT NOTE - Follow Up Consult  Pharmacy Consult for IV Heparin Indication: atrial fibrillation  No Known Allergies  Patient Measurements: Height: 5' 8"  (172.7 cm) Weight: 111.1 kg (244 lb 14.9 oz) IBW/kg (Calculated) : 63.9 Heparin Dosing Weight: 89 kg  Vital Signs: Temp: 98.6 F (37 C) (01/24 1838) Temp Source: Oral (01/24 1838) BP: 112/79 (01/24 1838) Pulse Rate: 96 (01/24 1838)  Labs: Recent Labs    06/08/20 0400 06/08/20 1429 06/09/20 0223 06/10/20 0026 06/10/20 0937 06/10/20 1804  HGB 9.4*  --  9.5* 9.6*  --   --   HCT 31.1*  --  30.7* 30.7*  --   --   PLT 242  --  224 230  --   --   APTT 46* 66* 74*  --   --   --   HEPARINUNFRC 0.26* 0.37 0.31 0.26* 0.20* 0.49  CREATININE 0.80  --  0.77 0.78  --   --     Estimated Creatinine Clearance: 99 mL/min (by C-G formula based on SCr of 0.78 mg/dL).   Medical History: Past Medical History:  Diagnosis Date  . Dysrhythmia   . PAF (paroxysmal atrial fibrillation) ([place])   . Tobacco abuse     Assessment: Pt was recently discharged from [facility] for diverticulitis with possible abscess. She is on chronic apixaban for afib (last dose on 1/19). She presented with worsening abd pain. Plan is to hold apixaban and bridge with IV heparin for possible intervention if needed. Pharmacy was consulted to dose IV heparin. Patient has had vaginal bleeding during admission, but now bleeding noted at this time.   HL this PM is within the goal range at 0.49. Hgb is stable 9.6 and PLTs 230.   Goal of Therapy:  Heparin level 0.3-0.7 units/ml Monitor platelets by anticoagulation protocol: Yes   Plan:  Continue heparin infusion at 2250 units/hr F/u with am HL Monitor daily heparin level, CBC Monitor for bleeding  Alanda Slim, PharmD, [facility] Clinical Pharmacist Please see AMION for all Pharmacists' Contact Phone Numbers 06/10/2020, 7:03 PM     06/10/2020 7:01 PM

## 2020-06-11 ENCOUNTER — Inpatient Hospital Stay (HOSPITAL_COMMUNITY): Payer: No Typology Code available for payment source

## 2020-06-11 DIAGNOSIS — U071 COVID-19: Secondary | ICD-10-CM

## 2020-06-11 LAB — BASIC METABOLIC PANEL
Anion gap: 11 (ref 5–15)
BUN: <5 mg/dL — ABNORMAL LOW (ref 6–20)
CO2: 23 mmol/L (ref 22–32)
Calcium: 9.2 mg/dL (ref 8.9–10.3)
Chloride: 104 mmol/L (ref 98–111)
Creatinine, Ser: 0.8 mg/dL (ref 0.44–1.00)
GFR, Estimated: >60 mL/min (ref >60)
Glucose, Bld: 102 mg/dL — ABNORMAL HIGH (ref 70–99)
Potassium: 3.9 mmol/L (ref 3.5–5.1)
Sodium: 138 mmol/L (ref 135–145)

## 2020-06-11 LAB — CBC
HCT: 32.9 % — ABNORMAL LOW (ref 36.0–46.0)
Hemoglobin: 9.9 g/dL — ABNORMAL LOW (ref 12.0–15.0)
MCH: 26.1 pg (ref 26.0–34.0)
MCHC: 30.1 g/dL (ref 30.0–36.0)
MCV: 86.8 fL (ref 80.0–100.0)
Platelets: 253 10*3/uL (ref 150–400)
RBC: 3.79 MIL/uL — ABNORMAL LOW (ref 3.87–5.11)
RDW: 15.5 % (ref 11.5–15.5)
WBC: 5.7 10*3/uL (ref 4.0–10.5)
nRBC: 0 % (ref 0.0–0.2)

## 2020-06-11 LAB — C-REACTIVE PROTEIN: CRP: 3.4 mg/dL — ABNORMAL HIGH (ref <1.0)

## 2020-06-11 LAB — GLUCOSE, CAPILLARY
Glucose-Capillary: 85 mg/dL (ref 70–99)
Glucose-Capillary: 87 mg/dL (ref 70–99)
Glucose-Capillary: 89 mg/dL (ref 70–99)
Glucose-Capillary: 99 mg/dL (ref 70–99)

## 2020-06-11 LAB — LACTATE DEHYDROGENASE: LDH: 151 U/L (ref 98–192)

## 2020-06-11 LAB — MAGNESIUM: Magnesium: 2 mg/dL (ref 1.7–2.4)

## 2020-06-11 LAB — HEPARIN LEVEL (UNFRACTIONATED): Heparin Unfractionated: 0.52 IU/mL (ref 0.30–0.70)

## 2020-06-11 LAB — FERRITIN: Ferritin: 143 ng/mL (ref 11–307)

## 2020-06-11 LAB — BRAIN NATRIURETIC PEPTIDE: B Natriuretic Peptide: 279.9 pg/mL — ABNORMAL HIGH (ref 0.0–100.0)

## 2020-06-11 LAB — PROCALCITONIN: Procalcitonin: <0.1 ng/mL

## 2020-06-11 MED ORDER — SODIUM CHLORIDE 0.9 % IV SOLN
1.0000 g | INTRAVENOUS | Status: DC
  Administered 2020-06-11 – 2020-06-17 (×7): 1000 mg via INTRAVENOUS
  Filled 2020-06-11 (×8): qty 1

## 2020-06-11 MED ORDER — SACCHAROMYCES BOULARDII 250 MG PO CAPS
250.0000 mg | ORAL_CAPSULE | Freq: Two times a day (BID) | ORAL | Status: DC
  Administered 2020-06-11 – 2020-06-18 (×15): 250 mg via ORAL
  Filled 2020-06-11 (×15): qty 1

## 2020-06-11 MED ORDER — ENSURE ENLIVE PO LIQD
237.0000 mL | Freq: Two times a day (BID) | ORAL | Status: DC
  Administered 2020-06-12 – 2020-06-14 (×5): 237 mL via ORAL

## 2020-06-11 MED ORDER — IBUPROFEN 400 MG PO TABS
400.0000 mg | ORAL_TABLET | Freq: Four times a day (QID) | ORAL | Status: DC | PRN
  Administered 2020-06-11 (×2): 400 mg via ORAL
  Filled 2020-06-11 (×2): qty 1

## 2020-06-11 NOTE — Progress Notes (Signed)
Nutrition Follow-up  DOCUMENTATION CODES:   Obesity unspecified  INTERVENTION:  Provide Ensure Enlive po BID, each supplement provides 350 kcal and 20 grams of protein.  Encourage adequate PO intake.   NUTRITION DIAGNOSIS:   Inadequate oral intake related to inability to eat as evidenced by NPO status; ongoing  GOAL:   Patient will meet greater than or equal to 90% of their needs; progressing  MONITOR:   Skin,Weight trends,Labs,I & O's,Diet advancement  REASON FOR ASSESSMENT:   Malnutrition Screening Tool    ASSESSMENT:   60 year old with history of paroxysmal A. fib on anticoagulation, tobacco use, obstructive sleep apnea on CPAP, HTN admitted for abdominal pain. CT abdomen pelvis showed sigmoid diverticulitis with small abscess. Pt COVID positive. Repeat CT 1/24 shows overall no significant improvement in the inflammatory changes of the sigmoid colon  Pt has been tolerating her liquid diet. Diet has been advanced to a soft diet today. Meal completion has been 50% at breakfast this morning. Pt continues to have intermittent abdominal discomfort/pain. RD to order nutritional supplements to aid in caloric and protein needs.   Labs and medications reviewed.   Diet Order:   Diet Order            DIET SOFT Room service appropriate? Yes; Fluid consistency: Thin  Diet effective now                 EDUCATION NEEDS:   Not appropriate for education at this time  Skin:  Skin Assessment: Reviewed RN Assessment  Last BM:  1/24  Height:   Ht Readings from Last 1 Encounters:  06/05/20 5\' 8"  (1.727 m)    Weight:   Wt Readings from Last 1 Encounters:  06/05/20 111.1 kg   BMI:  Body mass index is 37.24 kg/m.  Estimated Nutritional Needs:   Kcal:  2000-2200  Protein:  100-110 grams  Fluid:  >/= 2 L/day  Corrin Parker, MS, RD, LDN RD pager number/after hours weekend pager number on Amion.

## 2020-06-11 NOTE — Progress Notes (Signed)
[facility] Progress Note     Subjective: CC-  Feels about the same as yesterday from abdominal standpoint. Continues to have some intermittent LLQ abdominal pain. Less frequent since admission, but not resolved. Denies n/v. Small BM yesterday. Tolerating full liquids without increased abdominal pain. CT yesterday showed persistent sigmoid diverticulitis, no drainable fluid collection or abscess. WBC 5.7, afebrile. She does feel that her respiratory symptoms are worse today from covid. She feels more congested and short of breath. Requiring supplemental O2.  Objective: Vital signs in last 24 hours: Temp:  [97.7 F (36.5 C)-98.6 F (37 C)] 98 F (36.7 C) (01/25 0506) Pulse Rate:  [92-124] 92 (01/25 0843) Resp:  [13-22] 20 (01/25 0506) BP: (108-146)/(78-109) 116/78 (01/25 0843) SpO2:  [84 %-97 %] 97 % (01/25 0506) Last BM Date: 06/08/20  Intake/Output from previous day: 01/24 0701 - 01/25 0700 In: 360 [P.O.:360] Out: -  Intake/Output this shift: Total I/O In: 240 [P.O.:240] Out: 1 [Urine:1]  PE: Gen: Alert, NAD, pleasant Card: RRR Pulm:Rate and effort normal on 2L [place] Abd: Soft,ND,minimaltenderness of the LLQ without peritonitis,+BS Ext: No LE edema Psych: A&Ox3  Skin: no rashes noted, warm and dry  Lab Results:  Recent Labs    06/10/20 0026 06/11/20 0201  WBC 5.3 5.7  HGB 9.6* 9.9*  HCT 30.7* 32.9*  PLT 230 253   BMET Recent Labs    06/10/20 0026 06/11/20 0201  NA 136 138  K 3.6 3.9  CL 105 104  CO2 23 23  GLUCOSE 110* 102*  BUN 7 <5*  CREATININE 0.78 0.80  CALCIUM 8.6* 9.2   PT/INR No results for input(s): LABPROT, INR in the last 72 hours. CMP     Component Value Date/Time   NA 138 06/11/2020 0201   NA 141 07/19/2019 0840   K 3.9 06/11/2020 0201   CL 104 06/11/2020 0201   CO2 23 06/11/2020 0201   GLUCOSE 102 (H) 06/11/2020 0201   BUN <5 (L) 06/11/2020 0201   BUN 13 07/19/2019 0840   CREATININE 0.80 06/11/2020 0201    CREATININE 0.88 09/09/2016 1146   CALCIUM 9.2 06/11/2020 0201   PROT 5.8 (L) 06/07/2020 0603   ALBUMIN 2.8 (L) 06/07/2020 0603   AST 23 06/07/2020 0603   ALT 20 06/07/2020 0603   ALKPHOS 94 06/07/2020 0603   BILITOT 1.0 06/07/2020 0603   GFRNONAA >60 06/11/2020 0201   GFRNONAA 74 09/09/2016 1146   GFRAA 105 07/19/2019 0840   GFRAA 86 09/09/2016 1146   Lipase     Component Value Date/Time   LIPASE 25 05/29/2020 0951       Studies/Results: CT ABDOMEN PELVIS W CONTRAST  Result Date: 06/10/2020 CLINICAL DATA:  60 year old female with abdominal pain. Concern for acute diverticulitis. EXAM: CT ABDOMEN AND PELVIS WITH CONTRAST TECHNIQUE: Multidetector CT imaging of the abdomen and pelvis was performed using the standard protocol following bolus administration of intravenous contrast. CONTRAST:  172m OMNIPAQUE IOHEXOL 300 MG/ML  SOLN COMPARISON:  CT abdomen pelvis dated 06/06/2020. FINDINGS: Lower chest: Partially visualized small bilateral pleural effusions, new since the prior CT. There is associated partial compressive atelectasis of the lower lobes. Pneumonia is less likely but not excluded clinical correlation is recommended. No intra-abdominal free air. Small free fluid in the pelvis. Hepatobiliary: Fatty liver. Large right hepatic hemangioma better characterized on the prior MRI. No intrahepatic biliary ductal dilatation. Cholecystectomy. Stable appearance of hypoenhancing area in the left lobe of the liver superior to the cholecystectomy. Pancreas: Unremarkable. No  pancreatic ductal dilatation or surrounding inflammatory changes. Spleen: Normal in size without focal abnormality. Adrenals/Urinary Tract: The adrenal glands unremarkable. The kidneys, visualized ureters, and urinary bladder appear unremarkable. Stomach/Bowel: There is sigmoid diverticulosis with diffusely thickened and inflamed sigmoid colon most likely acute diverticulitis, and less likely colitis. Clinical correlation is  recommended. Overall no significant improvement in the inflammatory changes of the sigmoid colon compared to prior CT. Small amount of partially loculated fluid or phlegmon to the left of the sigmoid colon as seen on the prior CT. No discrete drainable fluid collection or abscess identified. There is a 2.2 x 1.8 cm ill-defined inflammatory area inferior to the sigmoid colon (76/3). No extraluminal air. There is no bowel obstruction. The appendix is normal. Vascular/Lymphatic: Mild aortoiliac atherosclerotic disease. The IVC is unremarkable. No portal venous gas. There is no adenopathy. Reproductive: The uterus is anteverted and grossly unremarkable. No adnexal masses. Other: Midline vertical anterior pelvic wall incisional scar. Musculoskeletal: Osteopenia with degenerative changes of the spine. No acute osseous pathology. IMPRESSION: 1. Persistent sigmoid diverticulitis. No drainable fluid collection or abscess. 2. Partially visualized small bilateral pleural effusions, new since the prior CT. 3. Fatty liver. Large right hepatic hemangioma better characterized on the prior MRI. 4. Aortic Atherosclerosis (ICD10-I70.0). Electronically Signed   By: Anner Crete M.D.   On: 06/10/2020 16:22   DG Chest Port 1 View  Result Date: 06/11/2020 CLINICAL DATA:  Dyspnea.  COVID positive EXAM: PORTABLE CHEST 1 VIEW COMPARISON:  10/11/2017 FINDINGS: Cardiac enlargement without heart failure. Lungs are clear without infiltrate or effusion. IMPRESSION: No active disease. Electronically Signed   By: Franchot Gallo M.D.   On: 06/11/2020 10:22    Anti-infectives: Anti-infectives (From admission, onward)   Start     Dose/Rate Route Frequency Ordered Stop   06/11/20 1200  ertapenem (INVANZ) 1,000 mg in sodium chloride 0.9 % 100 mL IVPB        1 g 200 mL/hr over 30 Minutes Intravenous Every 24 hours 06/11/20 1114     06/06/20 1850  piperacillin-tazobactam (ZOSYN) IVPB 3.375 g  Status:  Discontinued        3.375 g 12.5  mL/hr over 240 Minutes Intravenous Every 8 hours 06/06/20 1850 06/11/20 1114   06/06/20 1830  piperacillin-tazobactam (ZOSYN) IVPB 3.375 g        3.375 g 100 mL/hr over 30 Minutes Intravenous  Once 06/06/20 1818 06/06/20 2039       Assessment/Plan Paroxysmal A. Fib on Eliquis at home  OSA HTN COVID +  Acute sigmoid diverticulitis with small phlegmon/ abscess in distal sigmoid colon - Initially dx w/ diverticulitis on 1/12 and d/c with Augmentin. Failed outpatient therapy. CT 1/20 showed sigmoid diverticulitis w/ small phlegmon vs abscess just inferior to distal sigmoid colon measuring3.4 x 3.3 x 2.7 cm. Our team felt this was to small for IR drainage.  - repeat CT 1/24 shows overall no significant improvement in the inflammatory changes of the sigmoid colon; small amount of partially loculated fluid or phlegmon to the left of the sigmoid colon with no discrete drainable fluid collection; 2.2 x 1.8 cm ill-defined inflammatory area inferior to the sigmoid colon (76/3); no extraluminal air, no bowel obstruction - Hopefully patient will improve with conservative tx. We discussed if she was to worsen and require surgery during admission, this would likely result in a colostomy. With current covid diagnosis she is a poor surgical candidate due to increased risk of respiratory complications. - If patient improves with conservative tx and  aviods surgery, would recommend a colonoscopy in 4-6 weeks. She denies hx of colonoscopy in the past  FEN -IVF,FLD, Boost breeze VTE -SCDs, heparin gtt (Eliquis on hold) ID -[id number]>>  Plan:Repeat CT findings noted above. WBC WNL, afebrile. Patient overall clinically improved but she is still having some discomfort and has unchanged findings of sigmoid diverticulitis on CT scan compared to previous scan. Will change antibiotics to invanz. [place] for soft diet. Continue bowel regimen with BID miralax.   LOS: 5 days    [facility] 06/11/2020, 11:15 AM Please see Amion for pager number during day hours 7:00am-4:30pm

## 2020-06-11 NOTE — Progress Notes (Signed)
PROGRESS NOTE    Tammy Davies  QJF:[id number] DOB: [birth year] DOA: 06/05/2020 PCP: Patient, No Pcp Per   Brief Narrative:   60 year old with history of paroxysmal A. fib on anticoagulation, tobacco use, obstructive sleep apnea on CPAP, HTN admitted for abdominal pain which has been ongoing for the past month which previously was treated as constipation and then uncomplicated diverticulitis about a week ago with Augmentin.  She presents with worsening of her symptoms.  CT abdomen pelvis showed sigmoid diverticulitis with small abscess.  General surgery consulted and started on Zosyn. Repeat CT showed persistent diverticulitis with absces  Assessment & Plan:   Active Problems:   Acute diverticulitis  Acute complicated diverticulitis with small abscess -Conservative management, pain control -Advance diet per general surgery -Repeat CT A/P-shows persistent diverticulitis. - No role for general surgery or IR drain at the moment but this may change - IV Zosyn D6 -Outpatient colonoscopy in 6 weeks  COVID-19 infection, mainly asymptomatic - Asymptomatic.  Supportive care.  Chest x-ray 1/25 -negative.     history of paroxysmal atrial fibrillation - On Cardizem.  Eliquis on hold.  Continue heparin drip until cleared by General surgery - Monitor hemoglobin closely.  Essential hypertension -continue home Cardizem   DVT prophylaxis: Heparin drip Code Status: Full code Family Communication:    Status is: Inpatient  Remains inpatient appropriate because:Inpatient level of care appropriate due to severity of illness   Dispo: The patient is from: Home              Anticipated d/c is to: Home              Anticipated d/c date is: 2 days              Patient currently is not medically stable to d/c.  Maintain hospital stay for complicated diverticulitis on IV antibiotics and heparin drip.  Discharge once cleared by general surgery.   Body mass index is 37.24 kg/m.     Subjective: She feels slightly dyspneic this morning but saturating okay.  Has not had a bowel movement in last 2-3 days.  No other complaints.  Review of Systems Otherwise negative except as per HPI, including: General: Denies fever, chills, night sweats or unintended weight loss. Resp: Denies cough Cardiac: Denies chest pain, palpitations, orthopnea, paroxysmal nocturnal dyspnea. GI: Denies abdominal pain, nausea, vomiting, diarrhea or constipation GU: Denies dysuria, frequency, hesitancy or incontinence MS: Denies muscle aches, joint pain or swelling Neuro: Denies headache, neurologic deficits (focal weakness, numbness, tingling), abnormal gait Psych: Denies anxiety, depression, SI/HI/AVH Skin: Denies new rashes or lesions ID: Denies sick contacts, exotic exposures, travel   Examination: Constitutional: Not in acute distress Respiratory: Clear to auscultation bilaterally Cardiovascular: Normal sinus rhythm, no rubs Abdomen: Nontender nondistended good bowel sounds Musculoskeletal: No edema noted Skin: No rashes seen Neurologic: CN 2-12 grossly intact.  And nonfocal Psychiatric: Normal judgment and insight. Alert and oriented x 3. Normal mood.  Objective: Vitals:   06/10/20 1838 06/10/20 2000 06/11/20 0506 06/11/20 0843  BP: 112/79 112/90 108/81 116/78  Pulse: 96 97 93 92  Resp: 19 13 20    Temp: 98.6 F (37 C) 98 F (36.7 C) 98 F (36.7 C)   TempSrc: Oral Oral Oral   SpO2: 90% 97% 97%   Weight:      Height:        Intake/Output Summary (Last 24 hours) at 06/11/2020 1109 Last data filed at 06/11/2020 1054 Gross per 24 hour  Intake 240 ml  Output 1 ml  Net 239 ml   Filed Weights   06/05/20 2219  Weight: 111.1 kg     Data Reviewed:   CBC: Recent Labs  Lab 06/07/20 0603 06/08/20 0400 06/09/20 0223 06/10/20 0026 06/11/20 0201  WBC 5.7 6.5 6.2 5.3 5.7  HGB 10.2* 9.4* 9.5* 9.6* 9.9*  HCT 34.2* 31.1* 30.7* 30.7* 32.9*  MCV 87.9 88.6 85.8 86.2 86.8  PLT  242 242 224 230 734   Basic Metabolic Panel: Recent Labs  Lab 06/07/20 0603 06/08/20 0400 06/09/20 0223 06/10/20 0026 06/11/20 0201  NA 137 137 136 136 138  K 4.4 3.8 3.5 3.6 3.9  CL 103 106 104 105 104  CO2 22 20* 24 23 23   GLUCOSE 102* 77 94 110* 102*  BUN 14 14 9 7  <5*  CREATININE 0.88 0.80 0.77 0.78 0.80  CALCIUM 8.8* 8.3* 8.5* 8.6* 9.2  MG  --  1.8 1.9 1.9 2.0   GFR: Estimated Creatinine Clearance: 99 mL/min (by C-G formula based on SCr of 0.8 mg/dL). Liver Function Tests: Recent Labs  Lab 06/05/20 2232 06/07/20 0603  AST 17 23  ALT 18 20  ALKPHOS 115 94  BILITOT 0.9 1.0  PROT 6.7 5.8*  ALBUMIN 3.3* 2.8*   No results for input(s): LIPASE, AMYLASE in the last 168 hours. No results for input(s): AMMONIA in the last 168 hours. Coagulation Profile: No results for input(s): INR, PROTIME in the last 168 hours. Cardiac Enzymes: No results for input(s): CKTOTAL, CKMB, CKMBINDEX, TROPONINI in the last 168 hours. BNP (last 3 results) No results for input(s): PROBNP in the last 8760 hours. HbA1C: No results for input(s): HGBA1C in the last 72 hours. CBG: Recent Labs  Lab 06/10/20 0033 06/10/20 0622 06/10/20 1252 06/10/20 1739 06/11/20 0525  GLUCAP 114* 115* 89 103* 87   Lipid Profile: No results for input(s): CHOL, HDL, LDLCALC, TRIG, CHOLHDL, LDLDIRECT in the last 72 hours. Thyroid Function Tests: No results for input(s): TSH, T4TOTAL, FREET4, T3FREE, THYROIDAB in the last 72 hours. Anemia Panel: Recent Labs    06/10/20 0026 06/11/20 0201  FERRITIN 144 143   Sepsis Labs: No results for input(s): PROCALCITON, LATICACIDVEN in the last 168 hours.  Recent Results (from the past 240 hour(s))  SARS CORONAVIRUS 2 (TAT 6-24 HRS) Nasopharyngeal Nasopharyngeal Swab     Status: Abnormal   Collection Time: 06/06/20  3:08 PM   Specimen: Nasopharyngeal Swab  Result Value Ref Range Status   SARS Coronavirus 2 POSITIVE (A) NEGATIVE Final    Comment:  (NOTE) SARS-CoV-2 target nucleic acids are DETECTED.  The SARS-CoV-2 RNA is generally detectable in upper and lower respiratory specimens during the acute phase of infection. Positive results are indicative of the presence of SARS-CoV-2 RNA. Clinical correlation with patient history and other diagnostic information is  necessary to determine patient infection status. Positive results do not rule out bacterial infection or co-infection with other viruses.  The expected result is Negative.  Fact Sheet for Patients: SugarRoll.be  Fact Sheet for Healthcare Providers: https://www.woods-mathews.com/  This test is not yet approved or cleared by the [place] FDA and  has been authorized for detection and/or diagnosis of SARS-CoV-2 by FDA under an Emergency Use Authorization (EUA). This EUA will remain  in effect (meaning this test can be used) for the duration of the COVID-19 declaration under Section 564(b)(1) of the Act, 21 U. S.C. section 360bbb-3(b)(1), unless the authorization is terminated or revoked sooner.   Performed at [facility],  [address], [place], [place] [address]          Radiology Studies: CT ABDOMEN PELVIS W CONTRAST  Result Date: 06/10/2020 CLINICAL DATA:  60 year old female with abdominal pain. Concern for acute diverticulitis. EXAM: CT ABDOMEN AND PELVIS WITH CONTRAST TECHNIQUE: Multidetector CT imaging of the abdomen and pelvis was performed using the standard protocol following bolus administration of intravenous contrast. CONTRAST:  131m OMNIPAQUE IOHEXOL 300 MG/ML  SOLN COMPARISON:  CT abdomen pelvis dated 06/06/2020. FINDINGS: Lower chest: Partially visualized small bilateral pleural effusions, new since the prior CT. There is associated partial compressive atelectasis of the lower lobes. Pneumonia is less likely but not excluded clinical correlation is recommended. No intra-abdominal free air. Small  free fluid in the pelvis. Hepatobiliary: Fatty liver. Large right hepatic hemangioma better characterized on the prior MRI. No intrahepatic biliary ductal dilatation. Cholecystectomy. Stable appearance of hypoenhancing area in the left lobe of the liver superior to the cholecystectomy. Pancreas: Unremarkable. No pancreatic ductal dilatation or surrounding inflammatory changes. Spleen: Normal in size without focal abnormality. Adrenals/Urinary Tract: The adrenal glands unremarkable. The kidneys, visualized ureters, and urinary bladder appear unremarkable. Stomach/Bowel: There is sigmoid diverticulosis with diffusely thickened and inflamed sigmoid colon most likely acute diverticulitis, and less likely colitis. Clinical correlation is recommended. Overall no significant improvement in the inflammatory changes of the sigmoid colon compared to prior CT. Small amount of partially loculated fluid or phlegmon to the left of the sigmoid colon as seen on the prior CT. No discrete drainable fluid collection or abscess identified. There is a 2.2 x 1.8 cm ill-defined inflammatory area inferior to the sigmoid colon (76/3). No extraluminal air. There is no bowel obstruction. The appendix is normal. Vascular/Lymphatic: Mild aortoiliac atherosclerotic disease. The IVC is unremarkable. No portal venous gas. There is no adenopathy. Reproductive: The uterus is anteverted and grossly unremarkable. No adnexal masses. Other: Midline vertical anterior pelvic wall incisional scar. Musculoskeletal: Osteopenia with degenerative changes of the spine. No acute osseous pathology. IMPRESSION: 1. Persistent sigmoid diverticulitis. No drainable fluid collection or abscess. 2. Partially visualized small bilateral pleural effusions, new since the prior CT. 3. Fatty liver. Large right hepatic hemangioma better characterized on the prior MRI. 4. Aortic Atherosclerosis (ICD10-I70.0). Electronically Signed   By: AAnner CreteM.D.   On: 06/10/2020  16:22   DG Chest Port 1 View  Result Date: 06/11/2020 CLINICAL DATA:  Dyspnea.  COVID positive EXAM: PORTABLE CHEST 1 VIEW COMPARISON:  10/11/2017 FINDINGS: Cardiac enlargement without heart failure. Lungs are clear without infiltrate or effusion. IMPRESSION: No active disease. Electronically Signed   By: CFranchot GalloM.D.   On: 06/11/2020 10:22        Scheduled Meds: . diltiazem  120 mg Oral Daily  . polyethylene glycol  17 g Oral BID   Continuous Infusions: . dextrose 5 % and 0.45% NaCl Stopped (06/10/20 1634)  . heparin 2,250 Units/hr (06/11/20 0240)  . piperacillin-tazobactam (ZOSYN)  IV 3.375 g (06/11/20 0644)     LOS: 5 days   Time spent= 35 mins    Maresa Morash CArsenio Loader MD [facility]  If 7PM-7AM, please contact night-coverage  06/11/2020, 11:09 AM

## 2020-06-11 NOTE — Progress Notes (Signed)
ANTICOAGULATION CONSULT NOTE - Follow Up Consult  Pharmacy Consult for IV Heparin Indication: atrial fibrillation  No Known Allergies  Patient Measurements: Height: 5' 8"  (172.7 cm) Weight: 111.1 kg (244 lb 14.9 oz) IBW/kg (Calculated) : 63.9 Heparin Dosing Weight: 89 kg  Vital Signs: Temp: 98 F (36.7 C) (01/25 0506) Temp Source: Oral (01/25 0506) BP: 108/81 (01/25 0506) Pulse Rate: 93 (01/25 0506)  Labs: Recent Labs    06/08/20 1429 06/08/20 1429 06/09/20 0223 06/10/20 0026 06/10/20 0937 06/10/20 1804 06/11/20 0201  HGB  --    < > 9.5* 9.6*  --   --  9.9*  HCT  --   --  30.7* 30.7*  --   --  32.9*  PLT  --   --  224 230  --   --  253  APTT 66*  --  74*  --   --   --   --   HEPARINUNFRC 0.37  --  0.31 0.26* 0.20* 0.49 0.52  CREATININE  --   --  0.77 0.78  --   --  0.80   < > = values in this interval not displayed.    Estimated Creatinine Clearance: 99 mL/min (by C-G formula based on SCr of 0.8 mg/dL).   Medical History: Past Medical History:  Diagnosis Date  . Dysrhythmia   . PAF (paroxysmal atrial fibrillation) ([place])   . Tobacco abuse     Assessment: Pt was recently discharged from [facility] for diverticulitis with possible abscess. She is on chronic apixaban for afib (last dose on 1/19). She presented with worsening abd pain. Plan is to hold apixaban and bridge with IV heparin for possible intervention if needed. Pharmacy was consulted to dose IV heparin. Patient has had vaginal bleeding during admission, but now bleeding noted at this time.   HL this AM is within the goal range at 0.52. Hgb is stable 9.9 and PLTs 253.   Goal of Therapy:  Heparin level 0.3-0.7 units/ml Monitor platelets by anticoagulation protocol: Yes   Plan:  Continue heparin infusion at 2250 units/hr Monitor daily heparin level, CBC Monitor for bleeding  Adrianah Prophete A. Levada Dy, PharmD, BCPS, FNKF Clinical Pharmacist [place] Please utilize Amion for appropriate phone  number to reach the unit pharmacist ([place])  06/11/2020 7:50 AM

## 2020-06-12 LAB — CBC
HCT: 32.4 % — ABNORMAL LOW (ref 36.0–46.0)
Hemoglobin: 9.8 g/dL — ABNORMAL LOW (ref 12.0–15.0)
MCH: 26.3 pg (ref 26.0–34.0)
MCHC: 30.2 g/dL (ref 30.0–36.0)
MCV: 86.9 fL (ref 80.0–100.0)
Platelets: 254 10*3/uL (ref 150–400)
RBC: 3.73 MIL/uL — ABNORMAL LOW (ref 3.87–5.11)
RDW: 15.8 % — ABNORMAL HIGH (ref 11.5–15.5)
WBC: 7.1 10*3/uL (ref 4.0–10.5)
nRBC: 0 % (ref 0.0–0.2)

## 2020-06-12 LAB — BASIC METABOLIC PANEL
Anion gap: 8 (ref 5–15)
Anion gap: 12 (ref 5–15)
BUN: 7 mg/dL (ref 6–20)
BUN: 8 mg/dL (ref 6–20)
CO2: 25 mmol/L (ref 22–32)
CO2: 23 mmol/L (ref 22–32)
Calcium: 9.2 mg/dL (ref 8.9–10.3)
Calcium: 9 mg/dL (ref 8.9–10.3)
Chloride: 104 mmol/L (ref 98–111)
Chloride: 102 mmol/L (ref 98–111)
Creatinine, Ser: 0.81 mg/dL (ref 0.44–1.00)
Creatinine, Ser: 0.87 mg/dL (ref 0.44–1.00)
GFR, Estimated: >60 mL/min (ref >60)
GFR, Estimated: >60 mL/min (ref >60)
Glucose, Bld: 92 mg/dL (ref 70–99)
Glucose, Bld: 99 mg/dL (ref 70–99)
Potassium: 3.9 mmol/L (ref 3.5–5.1)
Potassium: 4.2 mmol/L (ref 3.5–5.1)
Sodium: 137 mmol/L (ref 135–145)
Sodium: 137 mmol/L (ref 135–145)

## 2020-06-12 LAB — LACTATE DEHYDROGENASE: LDH: 135 U/L (ref 98–192)

## 2020-06-12 LAB — HEPARIN LEVEL (UNFRACTIONATED): Heparin Unfractionated: 0.48 IU/mL (ref 0.30–0.70)

## 2020-06-12 LAB — GLUCOSE, CAPILLARY
Glucose-Capillary: 88 mg/dL (ref 70–99)
Glucose-Capillary: 104 mg/dL — ABNORMAL HIGH (ref 70–99)

## 2020-06-12 LAB — C-REACTIVE PROTEIN: CRP: 2.1 mg/dL — ABNORMAL HIGH (ref <1.0)

## 2020-06-12 LAB — MAGNESIUM: Magnesium: 1.9 mg/dL (ref 1.7–2.4)

## 2020-06-12 LAB — FERRITIN: Ferritin: 146 ng/mL (ref 11–307)

## 2020-06-12 MED ORDER — SODIUM CHLORIDE 0.9 % IV SOLN: INTRAVENOUS | Status: DC

## 2020-06-12 NOTE — Plan of Care (Signed)

## 2020-06-12 NOTE — Progress Notes (Signed)
[facility] Progress Note     Subjective: CC-  States that she had increased LLQ abdominal pain over night. She ate a hamburger and french fries for dinner which is what she thinks made her pain worse. Feeling better this AM. Denies n/v. Semi-solid BM yesterday. States that she feels SOB ambulating in room. Using supplemental O2 at night. WBC 7.1, afebrile.  Objective: Vital signs in last 24 hours: Temp:  [97.8 F (36.6 C)-98.3 F (36.8 C)] 98.3 F (36.8 C) (01/26 0409) Pulse Rate:  [77-103] 88 (01/26 0409) Resp:  [15-20] 18 (01/26 0409) BP: (116-134)/(78-91) 128/84 (01/26 0409) SpO2:  [90 %-99 %] 96 % (01/26 0409) Last BM Date: 06/11/20  Intake/Output from previous day: 01/25 0701 - 01/26 0700 In: 600 [P.O.:600] Out: 2 [Urine:2] Intake/Output this shift: No intake/output data recorded.  PE: Gen: Alert, NAD, pleasant Pulm:Rate and effort normal at rest Abd: Soft,ND,minimaltenderness of the LLQ without peritonitis,+BS Ext: No LE edema Psych: A&Ox3  Skin: no rashes noted, warm and dry   Lab Results:  Recent Labs    06/11/20 0201 06/12/20 0244  WBC 5.7 7.1  HGB 9.9* 9.8*  HCT 32.9* 32.4*  PLT 253 254   BMET Recent Labs    06/11/20 0201 06/12/20 0244  NA 138 137  K 3.9 3.9  CL 104 104  CO2 23 25  GLUCOSE 102* 92  BUN <5* 7  CREATININE 0.80 0.81  CALCIUM 9.2 9.2   PT/INR No results for input(s): LABPROT, INR in the last 72 hours. CMP     Component Value Date/Time   NA 137 06/12/2020 0244   NA 141 07/19/2019 0840   K 3.9 06/12/2020 0244   CL 104 06/12/2020 0244   CO2 25 06/12/2020 0244   GLUCOSE 92 06/12/2020 0244   BUN 7 06/12/2020 0244   BUN 13 07/19/2019 0840   CREATININE 0.81 06/12/2020 0244   CREATININE 0.88 09/09/2016 1146   CALCIUM 9.2 06/12/2020 0244   PROT 5.8 (L) 06/07/2020 0603   ALBUMIN 2.8 (L) 06/07/2020 0603   AST 23 06/07/2020 0603   ALT 20 06/07/2020 0603   ALKPHOS 94 06/07/2020 0603   BILITOT 1.0  06/07/2020 0603   GFRNONAA >60 06/12/2020 0244   GFRNONAA 74 09/09/2016 1146   GFRAA 105 07/19/2019 0840   GFRAA 86 09/09/2016 1146   Lipase     Component Value Date/Time   LIPASE 25 05/29/2020 0951       Studies/Results: CT ABDOMEN PELVIS W CONTRAST  Result Date: 06/10/2020 CLINICAL DATA:  60 year old female with abdominal pain. Concern for acute diverticulitis. EXAM: CT ABDOMEN AND PELVIS WITH CONTRAST TECHNIQUE: Multidetector CT imaging of the abdomen and pelvis was performed using the standard protocol following bolus administration of intravenous contrast. CONTRAST:  114m OMNIPAQUE IOHEXOL 300 MG/ML  SOLN COMPARISON:  CT abdomen pelvis dated 06/06/2020. FINDINGS: Lower chest: Partially visualized small bilateral pleural effusions, new since the prior CT. There is associated partial compressive atelectasis of the lower lobes. Pneumonia is less likely but not excluded clinical correlation is recommended. No intra-abdominal free air. Small free fluid in the pelvis. Hepatobiliary: Fatty liver. Large right hepatic hemangioma better characterized on the prior MRI. No intrahepatic biliary ductal dilatation. Cholecystectomy. Stable appearance of hypoenhancing area in the left lobe of the liver superior to the cholecystectomy. Pancreas: Unremarkable. No pancreatic ductal dilatation or surrounding inflammatory changes. Spleen: Normal in size without focal abnormality. Adrenals/Urinary Tract: The adrenal glands unremarkable. The kidneys, visualized ureters, and urinary bladder appear unremarkable.  Stomach/Bowel: There is sigmoid diverticulosis with diffusely thickened and inflamed sigmoid colon most likely acute diverticulitis, and less likely colitis. Clinical correlation is recommended. Overall no significant improvement in the inflammatory changes of the sigmoid colon compared to prior CT. Small amount of partially loculated fluid or phlegmon to the left of the sigmoid colon as seen on the prior CT.  No discrete drainable fluid collection or abscess identified. There is a 2.2 x 1.8 cm ill-defined inflammatory area inferior to the sigmoid colon (76/3). No extraluminal air. There is no bowel obstruction. The appendix is normal. Vascular/Lymphatic: Mild aortoiliac atherosclerotic disease. The IVC is unremarkable. No portal venous gas. There is no adenopathy. Reproductive: The uterus is anteverted and grossly unremarkable. No adnexal masses. Other: Midline vertical anterior pelvic wall incisional scar. Musculoskeletal: Osteopenia with degenerative changes of the spine. No acute osseous pathology. IMPRESSION: 1. Persistent sigmoid diverticulitis. No drainable fluid collection or abscess. 2. Partially visualized small bilateral pleural effusions, new since the prior CT. 3. Fatty liver. Large right hepatic hemangioma better characterized on the prior MRI. 4. Aortic Atherosclerosis (ICD10-I70.0). Electronically Signed   By: Anner Crete M.D.   On: 06/10/2020 16:22   DG Chest Port 1 View  Result Date: 06/11/2020 CLINICAL DATA:  Dyspnea.  COVID positive EXAM: PORTABLE CHEST 1 VIEW COMPARISON:  10/11/2017 FINDINGS: Cardiac enlargement without heart failure. Lungs are clear without infiltrate or effusion. IMPRESSION: No active disease. Electronically Signed   By: Franchot Gallo M.D.   On: 06/11/2020 10:22    Anti-infectives: Anti-infectives (From admission, onward)   Start     Dose/Rate Route Frequency Ordered Stop   06/11/20 1230  ertapenem (INVANZ) 1,000 mg in sodium chloride 0.9 % 100 mL IVPB        1 g 200 mL/hr over 30 Minutes Intravenous Every 24 hours 06/11/20 1114     06/06/20 1850  piperacillin-tazobactam (ZOSYN) IVPB 3.375 g  Status:  Discontinued        3.375 g 12.5 mL/hr over 240 Minutes Intravenous Every 8 hours 06/06/20 1850 06/11/20 1114   06/06/20 1830  piperacillin-tazobactam (ZOSYN) IVPB 3.375 g        3.375 g 100 mL/hr over 30 Minutes Intravenous  Once 06/06/20 1818 06/06/20 2039        Assessment/Plan Paroxysmal A. Fib on Eliquis at home  OSA HTN COVID +  Acute sigmoid diverticulitis with small phlegmon/ abscess in distal sigmoid colon - Initially dx w/ diverticulitis on 1/12 and d/c with Augmentin. Failed outpatient therapy. CT 1/20 showed sigmoid diverticulitis w/ small phlegmon vs abscess just inferior to distal sigmoid colon measuring3.4 x 3.3 x 2.7 cm. Our team felt this was to small for IR drainage.  - repeat CT 1/24 shows overall no significant improvement in the inflammatory changes of the sigmoid colon; small amount of partially loculated fluid or phlegmon to the left of the sigmoid colon with no discrete drainable fluid collection; 2.2 x 1.8 cm ill-defined inflammatory area inferior to the sigmoid colon (76/3); no extraluminal air, no bowel obstruction - Hopefully patient will improve with conservative tx. We discussed if she was to worsen and require surgery during admission, this would likely result in a colostomy. With current covid diagnosis she is a poor surgical candidate due to increased risk of respiratory complications. - If patient improves with conservative tx and aviods surgery, would recommend a colonoscopy in 4-6 weeks. She denies hx of colonoscopy in the past but has made arrangements to see GI at [facility] after  discharge.  FEN -IVF, soft diet, Boost VTE -SCDs, heparin gtt (Eliquis on hold) ID -Zosyn1/20>>1/25, invanz 1/25>>  Plan:Will keep on soft diet but patient is going to modify oral intake for more of a bland diet. Continue IV invanz today.    LOS: 6 days    [facility] 06/12/2020, 8:30 AM Please see Amion for pager number during day hours 7:00am-4:30pm

## 2020-06-12 NOTE — Progress Notes (Signed)
Pt continued to have abdominal pain overnight requiring the q4 dilaudid IV dose ordered.  Pt. reports she overdid it with her switch from full liquids to her GI soft diet and believes that is what exacerbated the issue.

## 2020-06-12 NOTE — Progress Notes (Signed)
ANTICOAGULATION CONSULT NOTE - Follow Up Consult  Pharmacy Consult for IV Heparin Indication: atrial fibrillation  No Known Allergies  Patient Measurements: Height: 5' 8"  (172.7 cm) Weight: 111.1 kg (244 lb 14.9 oz) IBW/kg (Calculated) : 63.9 Heparin Dosing Weight: 89 kg  Vital Signs: Temp: 98.3 F (36.8 C) (01/26 0409) Temp Source: Oral (01/26 0409) BP: 128/84 (01/26 0409) Pulse Rate: 88 (01/26 0409)  Labs: Recent Labs    06/10/20 0026 06/10/20 0937 06/10/20 1804 06/11/20 0201 06/12/20 0244  HGB 9.6*  --   --  9.9* 9.8*  HCT 30.7*  --   --  32.9* 32.4*  PLT 230  --   --  253 254  HEPARINUNFRC 0.26*   < > 0.49 0.52 0.48  CREATININE 0.78  --   --  0.80 0.81   < > = values in this interval not displayed.    Estimated Creatinine Clearance: 97.8 mL/min (by C-G formula based on SCr of 0.81 mg/dL).   Medical History: Past Medical History:  Diagnosis Date  . Dysrhythmia   . PAF (paroxysmal atrial fibrillation) ([place])   . Tobacco abuse     Assessment: Pt was recently discharged from [facility] for diverticulitis with possible abscess. She is on chronic apixaban for afib (last dose on 1/19). She presented with worsening abd pain. Plan is to hold apixaban and bridge with IV heparin for possible intervention if needed. Pharmacy was consulted to dose IV heparin. Patient has had vaginal bleeding during admission, but no bleeding noted at this time.   HL this AM is within the goal range at 0.48. Hgb is stable 9.8 and PLTs 254.   Goal of Therapy:  Heparin level 0.3-0.7 units/ml Monitor platelets by anticoagulation protocol: Yes   Plan:  Continue heparin infusion at 2250 units/hr Monitor daily heparin level, CBC Monitor for bleeding  Gwendlyon Zumbro A. Levada Dy, PharmD, BCPS, FNKF Clinical Pharmacist [place] Please utilize Amion for appropriate phone number to reach the unit pharmacist ([place])  06/12/2020 8:09 AM

## 2020-06-12 NOTE — TOC Initial Note (Signed)
Transition of Care [facility]) - Initial/Assessment Note    Patient Details  Name: Tammy Davies MRN: [id number] Date of Birth: 06/07/1960  Transition of Care [facility]) CM/SW Contact:    Carles Collet, RN Phone Number: 06/12/2020, 4:27 PM  Clinical Narrative:                 Damaris Schooner w patient over the phone to discuss DC plan. Patient states  That she has insurance coverage through [employer], a product of [employer]. Effective 05/18/20. SHe states this coverage includes prescription drug costs. She has follow up scheduled with [facility]. No other CM needs identified at this time.   Expected Discharge Plan: Home/Self Care Barriers to Discharge: Continued Medical Work up   Patient Goals and CMS Choice        Expected Discharge Plan and Services Expected Discharge Plan: Home/Self Care                                              Prior Living Arrangements/Services                       Activities of Daily Living Home Assistive Devices/Equipment: Hand-held shower hose,Eyeglasses ADL Screening (condition at time of admission) Patient's cognitive ability adequate to safely complete daily activities?: Yes Is the patient deaf or have difficulty hearing?: No Does the patient have difficulty seeing, even when wearing glasses/contacts?: No Does the patient have difficulty concentrating, remembering, or making decisions?: No Patient able to express need for assistance with ADLs?: Yes Does the patient have difficulty dressing or bathing?: No Independently performs ADLs?: Yes (appropriate for developmental age) Does the patient have difficulty walking or climbing stairs?: No Weakness of Legs: None Weakness of Arms/Hands: None  Permission Sought/Granted                  Emotional Assessment              Admission diagnosis:  Acute diverticulitis [K57.92] Abscess of sigmoid colon due to diverticulitis [K57.20] Patient Active Problem List    Diagnosis Date Noted  . Acute diverticulitis 06/06/2020  . Elevated glucose 06/08/2017  . Hypertension 06/08/2017  . S/P laparoscopic cholecystectomy 08/20/2016  . Tobacco abuse 08/20/2016  . AF (paroxysmal atrial fibrillation) ([place]) 08/20/2016  . Pancreatitis 08/15/2016  . Liver mass, 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   PCP:  Patient, No Pcp Per Pharmacy:   [facility], [address] [address] Phone: [phone] Fax: [place], [address] [place] [address] Phone: [phone] Fax: [phone]     Social Determinants of Health (SDOH) Interventions    Readmission Risk Interventions No flowsheet data found.

## 2020-06-12 NOTE — Progress Notes (Signed)
PROGRESS NOTE    Tammy Davies  RJJ:[id number] DOB: 1961-03-03 DOA: 06/05/2020 PCP: Patient, No Pcp Per   Brief Narrative:   60 year old with history of paroxysmal A. fib on anticoagulation, tobacco use, obstructive sleep apnea on CPAP, HTN admitted for abdominal pain which has been ongoing for the past month which previously was treated as constipation and then uncomplicated diverticulitis about a week ago with Augmentin.  She presents with worsening of her symptoms.  CT abdomen pelvis showed sigmoid diverticulitis with small abscess.  General surgery consulted and started on Zosyn. Repeat CT showed persistent diverticulitis with absces     Subjective:  Report abdominal pain yesterday evening after she started soft diet requiring IV pain medications.  Assessment & Plan:   Active Problems:   Acute diverticulitis  Acute complicated diverticulitis with small abscess -Conservative management, pain control -Advance diet per general surgery -Repeat CT A/P-shows persistent diverticulitis.  So IV Zosyn has been changed to IV Invanz, so far plan to hold on any intervention or surgery, further management per general surgery. - No role for general surgery or IR drain at the moment but this may change -Outpatient colonoscopy in 6 weeks  COVID-19 infection, mainly asymptomatic - Asymptomatic.  Supportive care.  Chest x-ray 1/25 -negative.     history of paroxysmal atrial fibrillation - On Cardizem.  Eliquis on hold.  Continue heparin drip until cleared by General surgery - Monitor hemoglobin closely. -Heart rate was earlier uncontrolled, but currently has improved after receiving her Cardizem.  Essential hypertension -continue home Cardizem   DVT prophylaxis: Heparin drip Code Status: Full code Family Communication: Patient is awake, alert and appropriate, I have discussed her case in details with her, and answered all her questions.  Status is: Inpatient  Remains inpatient  appropriate because:Inpatient level of care appropriate due to severity of illness   Dispo: The patient is from: Home              Anticipated d/c is to: Home              Anticipated d/c date is: 2 days              Patient currently is not medically stable to d/c.  Maintain hospital stay for complicated diverticulitis on IV antibiotics and heparin drip.  Discharge once cleared by general surgery.   Body mass index is 37.24 kg/m.   Examination:  Awake Alert, Oriented X 3, No new F.N deficits, Normal affect Symmetrical Chest wall movement, Good air movement bilaterally, CTAB RRR,No Gallops,Rubs or new Murmurs, No Parasternal Heave +ve B.Sounds, Abd Soft, she has left lower quadrant tenderness, No rebound - guarding or rigidity. No Cyanosis, Clubbing or edema, No new Rash or bruise     Objective: Vitals:   06/11/20 1130 06/11/20 1443 06/11/20 1930 06/12/20 0409  BP:  (!) 128/91 134/85 128/84  Pulse:  77 (!) 103 88  Resp: 18 16 15 18   Temp:  97.9 F (36.6 C) 97.8 F (36.6 C) 98.3 F (36.8 C)  TempSrc:  Oral Oral Oral  SpO2:  99% 90% 96%  Weight:      Height:        Intake/Output Summary (Last 24 hours) at 06/12/2020 1521 Last data filed at 06/12/2020 0946 Gross per 24 hour  Intake 480 ml  Output -  Net 480 ml   Filed Weights   06/05/20 2219  Weight: 111.1 kg     Data Reviewed:   CBC: Recent Labs  Lab  06/08/20 0400 06/09/20 0223 06/10/20 0026 06/11/20 0201 06/12/20 0244  WBC 6.5 6.2 5.3 5.7 7.1  HGB 9.4* 9.5* 9.6* 9.9* 9.8*  HCT 31.1* 30.7* 30.7* 32.9* 32.4*  MCV 88.6 85.8 86.2 86.8 86.9  PLT 242 224 230 253 622   Basic Metabolic Panel: Recent Labs  Lab 06/08/20 0400 06/09/20 0223 06/10/20 0026 06/11/20 0201 06/12/20 0244  NA 137 136 136 138 137  K 3.8 3.5 3.6 3.9 3.9  CL 106 104 105 104 104  CO2 20* 24 23 23 25   GLUCOSE 77 94 110* 102* 92  BUN 14 9 7  <5* 7  CREATININE 0.80 0.77 0.78 0.80 0.81  CALCIUM 8.3* 8.5* 8.6* 9.2 9.2  MG 1.8 1.9  1.9 2.0 1.9   GFR: Estimated Creatinine Clearance: 97.8 mL/min (by C-G formula based on SCr of 0.81 mg/dL). Liver Function Tests: Recent Labs  Lab 06/05/20 2232 06/07/20 0603  AST 17 23  ALT 18 20  ALKPHOS 115 94  BILITOT 0.9 1.0  PROT 6.7 5.8*  ALBUMIN 3.3* 2.8*   No results for input(s): LIPASE, AMYLASE in the last 168 hours. No results for input(s): AMMONIA in the last 168 hours. Coagulation Profile: No results for input(s): INR, PROTIME in the last 168 hours. Cardiac Enzymes: No results for input(s): CKTOTAL, CKMB, CKMBINDEX, TROPONINI in the last 168 hours. BNP (last 3 results) No results for input(s): PROBNP in the last 8760 hours. HbA1C: No results for input(s): HGBA1C in the last 72 hours. CBG: Recent Labs  Lab 06/11/20 1256 06/11/20 1726 06/11/20 2332 06/12/20 0537 06/12/20 1303  GLUCAP 99 89 85 88 104*   Lipid Profile: No results for input(s): CHOL, HDL, LDLCALC, TRIG, CHOLHDL, LDLDIRECT in the last 72 hours. Thyroid Function Tests: No results for input(s): TSH, T4TOTAL, FREET4, T3FREE, THYROIDAB in the last 72 hours. Anemia Panel: Recent Labs    06/11/20 0201 06/12/20 0244  FERRITIN 143 146   Sepsis Labs: Recent Labs  Lab 06/11/20 0956  PROCALCITON <0.10    Recent Results (from the past 240 hour(s))  SARS CORONAVIRUS 2 (TAT 6-24 HRS) Nasopharyngeal Nasopharyngeal Swab     Status: Abnormal   Collection Time: 06/06/20  3:08 PM   Specimen: Nasopharyngeal Swab  Result Value Ref Range Status   SARS Coronavirus 2 POSITIVE (A) NEGATIVE Final    Comment: (NOTE) SARS-CoV-2 target nucleic acids are DETECTED.  The SARS-CoV-2 RNA is generally detectable in upper and lower respiratory specimens during the acute phase of infection. Positive results are indicative of the presence of SARS-CoV-2 RNA. Clinical correlation with patient history and other diagnostic information is  necessary to determine patient infection status. Positive results do not rule  out bacterial infection or co-infection with other viruses.  The expected result is Negative.  Fact Sheet for Patients: SugarRoll.be  Fact Sheet for Healthcare Providers: https://www.woods-mathews.com/  This test is not yet approved or cleared by the [place] FDA and  has been authorized for detection and/or diagnosis of SARS-CoV-2 by FDA under an Emergency Use Authorization (EUA). This EUA will remain  in effect (meaning this test can be used) for the duration of the COVID-19 declaration under Section 564(b)(1) of the Act, 21 U. S.C. section 360bbb-3(b)(1), unless the authorization is terminated or revoked sooner.   Performed at [facility], [place] [address] [place], [place] [address]          Radiology Studies: CT ABDOMEN PELVIS W CONTRAST  Result Date: 06/10/2020 CLINICAL DATA:  60 year old female with abdominal  pain. Concern for acute diverticulitis. EXAM: CT ABDOMEN AND PELVIS WITH CONTRAST TECHNIQUE: Multidetector CT imaging of the abdomen and pelvis was performed using the standard protocol following bolus administration of intravenous contrast. CONTRAST:  15m OMNIPAQUE IOHEXOL 300 MG/ML  SOLN COMPARISON:  CT abdomen pelvis dated 06/06/2020. FINDINGS: Lower chest: Partially visualized small bilateral pleural effusions, new since the prior CT. There is associated partial compressive atelectasis of the lower lobes. Pneumonia is less likely but not excluded clinical correlation is recommended. No intra-abdominal free air. Small free fluid in the pelvis. Hepatobiliary: Fatty liver. Large right hepatic hemangioma better characterized on the prior MRI. No intrahepatic biliary ductal dilatation. Cholecystectomy. Stable appearance of hypoenhancing area in the left lobe of the liver superior to the cholecystectomy. Pancreas: Unremarkable. No pancreatic ductal dilatation or surrounding inflammatory changes. Spleen: Normal in size without  focal abnormality. Adrenals/Urinary Tract: The adrenal glands unremarkable. The kidneys, visualized ureters, and urinary bladder appear unremarkable. Stomach/Bowel: There is sigmoid diverticulosis with diffusely thickened and inflamed sigmoid colon most likely acute diverticulitis, and less likely colitis. Clinical correlation is recommended. Overall no significant improvement in the inflammatory changes of the sigmoid colon compared to prior CT. Small amount of partially loculated fluid or phlegmon to the left of the sigmoid colon as seen on the prior CT. No discrete drainable fluid collection or abscess identified. There is a 2.2 x 1.8 cm ill-defined inflammatory area inferior to the sigmoid colon (76/3). No extraluminal air. There is no bowel obstruction. The appendix is normal. Vascular/Lymphatic: Mild aortoiliac atherosclerotic disease. The IVC is unremarkable. No portal venous gas. There is no adenopathy. Reproductive: The uterus is anteverted and grossly unremarkable. No adnexal masses. Other: Midline vertical anterior pelvic wall incisional scar. Musculoskeletal: Osteopenia with degenerative changes of the spine. No acute osseous pathology. IMPRESSION: 1. Persistent sigmoid diverticulitis. No drainable fluid collection or abscess. 2. Partially visualized small bilateral pleural effusions, new since the prior CT. 3. Fatty liver. Large right hepatic hemangioma better characterized on the prior MRI. 4. Aortic Atherosclerosis (ICD10-I70.0). Electronically Signed   By: AAnner CreteM.D.   On: 06/10/2020 16:22   DG Chest Port 1 View  Result Date: 06/11/2020 CLINICAL DATA:  Dyspnea.  COVID positive EXAM: PORTABLE CHEST 1 VIEW COMPARISON:  10/11/2017 FINDINGS: Cardiac enlargement without heart failure. Lungs are clear without infiltrate or effusion. IMPRESSION: No active disease. Electronically Signed   By: CFranchot GalloM.D.   On: 06/11/2020 10:22        Scheduled Meds: . diltiazem  120 mg Oral  Daily  . feeding supplement  237 mL Oral BID BM  . polyethylene glycol  17 g Oral BID  . saccharomyces boulardii  250 mg Oral BID   Continuous Infusions: . ertapenem 1,000 mg (06/12/20 1223)  . heparin 2,250 Units/hr (06/12/20 1111)     LOS: 6 days      DPhillips Climes MD [facility]  If 7PM-7AM, please contact night-coverage  06/12/2020, 3:21 PM

## 2020-06-13 DIAGNOSIS — K5720 Diverticulitis of large intestine with perforation and abscess without bleeding: Secondary | ICD-10-CM

## 2020-06-13 LAB — GLUCOSE, CAPILLARY
Glucose-Capillary: 101 mg/dL — ABNORMAL HIGH (ref 70–99)
Glucose-Capillary: 104 mg/dL — ABNORMAL HIGH (ref 70–99)
Glucose-Capillary: 106 mg/dL — ABNORMAL HIGH (ref 70–99)
Glucose-Capillary: 94 mg/dL (ref 70–99)
Glucose-Capillary: 94 mg/dL (ref 70–99)
Glucose-Capillary: 99 mg/dL (ref 70–99)

## 2020-06-13 LAB — CBC
HCT: 32.1 % — ABNORMAL LOW (ref 36.0–46.0)
Hemoglobin: 10.3 g/dL — ABNORMAL LOW (ref 12.0–15.0)
MCH: 27.2 pg (ref 26.0–34.0)
MCHC: 32.1 g/dL (ref 30.0–36.0)
MCV: 84.7 fL (ref 80.0–100.0)
Platelets: 280 10*3/uL (ref 150–400)
RBC: 3.79 MIL/uL — ABNORMAL LOW (ref 3.87–5.11)
RDW: 16.2 % — ABNORMAL HIGH (ref 11.5–15.5)
WBC: 6.8 10*3/uL (ref 4.0–10.5)
nRBC: 0 % (ref 0.0–0.2)

## 2020-06-13 LAB — HEPARIN LEVEL (UNFRACTIONATED): Heparin Unfractionated: 0.46 IU/mL (ref 0.30–0.70)

## 2020-06-13 MED ORDER — HYDROMORPHONE HCL 1 MG/ML IJ SOLN
0.5000 mg | INTRAMUSCULAR | Status: DC | PRN
  Administered 2020-06-13 – 2020-06-17 (×14): 0.5 mg via INTRAVENOUS
  Filled 2020-06-13 (×14): qty 0.5

## 2020-06-13 NOTE — Progress Notes (Signed)
PROGRESS NOTE    Tammy Davies  IRJ:[id number] DOB: August 03, 1960 DOA: 06/05/2020 PCP: Patient, No Pcp Per   Brief Narrative:   60 year old with history of paroxysmal A. fib on anticoagulation, tobacco use, obstructive sleep apnea on CPAP, HTN admitted for abdominal pain which has been ongoing for the past month which previously was treated as constipation and then uncomplicated diverticulitis about a week ago with Augmentin.  She presents with worsening of her symptoms.  CT abdomen pelvis showed sigmoid diverticulitis with small abscess.  General surgery consulted and started on Zosyn. Repeat CT showed persistent diverticulitis with absces     Subjective:  Reports abdominal pain has improved  Assessment & Plan:   Active Problems:   Acute diverticulitis  Acute complicated diverticulitis with small abscess -Conservative management, pain control -Advance diet per general surgery -Repeat CT A/P-shows persistent diverticulitis.  So she has been switched from IV Zosyn to Invanz, abdominal pain has improved today, she is tolerating p.o. intake, discussed with general surgery, will he can be discharged in 24 hours on p.o. regimen including Cipro as an Flagyl to finish total of 2 weeks . - No role for general surgery or IR drain at the moment but this may change -Outpatient colonoscopy in 6 weeks  COVID-19 infection, mainly asymptomatic - Asymptomatic.  Supportive care.  Chest x-ray 1/25 -negative.     history of paroxysmal atrial fibrillation - On Cardizem.  Eliquis on hold.  Continue heparin drip until cleared by General surgery - Monitor hemoglobin closely. -Heart rate was earlier uncontrolled, but currently has improved after receiving her Cardizem.  Essential hypertension -continue home Cardizem   DVT prophylaxis: Heparin drip Code Status: Full code Family Communication: Patient is awake, alert and appropriate, I have discussed her case in details with her, and answered all  her questions.  Status is: Inpatient  Remains inpatient appropriate because:Inpatient level of care appropriate due to severity of illness   Dispo: The patient is from: Home              Anticipated d/c is to: Home              Anticipated d/c date is: 1 day              Patient currently is not medically stable to d/c.    Body mass index is 37.24 kg/m.   Examination:  Awake Alert, Oriented X 3, No new F.N deficits, Normal affect Symmetrical Chest wall movement, Good air movement bilaterally, CTAB Irregular irregular,No Gallops,Rubs or new Murmurs, No Parasternal Heave +ve B.Sounds, Abd Soft, left lower quadrant tenderness improved, No rebound - guarding or rigidity. No Cyanosis, Clubbing or edema, No new Rash or bruise     Objective: Vitals:   06/12/20 1702 06/12/20 2009 06/13/20 0445 06/13/20 1251  BP: 115/82 125/87 (!) 106/93 131/80  Pulse: 75 82 98 98  Resp: 14 18 20 20   Temp: 97.8 F (36.6 C) 99.2 F (37.3 C) 98.2 F (36.8 C) 98.5 F (36.9 C)  TempSrc: Oral Oral Oral Axillary  SpO2: 92% 95% 95%   Weight:      Height:        Intake/Output Summary (Last 24 hours) at 06/13/2020 1550 Last data filed at 06/13/2020 1534 Gross per 24 hour  Intake 2374.59 ml  Output -  Net 2374.59 ml   Filed Weights   06/05/20 2219  Weight: 111.1 kg     Data Reviewed:   CBC: Recent Labs  Lab 06/09/20 0223 06/10/20  3343 06/11/20 0201 06/12/20 0244 06/13/20 0500  WBC 6.2 5.3 5.7 7.1 6.8  HGB 9.5* 9.6* 9.9* 9.8* 10.3*  HCT 30.7* 30.7* 32.9* 32.4* 32.1*  MCV 85.8 86.2 86.8 86.9 84.7  PLT 224 230 253 254 568   Basic Metabolic Panel: Recent Labs  Lab 06/08/20 0400 06/09/20 0223 06/10/20 0026 06/11/20 0201 06/12/20 0244 06/12/20 1944  NA 137 136 136 138 137 137  K 3.8 3.5 3.6 3.9 3.9 4.2  CL 106 104 105 104 104 102  CO2 20* 24 23 23 25 23   GLUCOSE 77 94 110* 102* 92 99  BUN 14 9 7  <5* 7 8  CREATININE 0.80 0.77 0.78 0.80 0.81 0.87  CALCIUM 8.3* 8.5* 8.6* 9.2  9.2 9.0  MG 1.8 1.9 1.9 2.0 1.9  --    GFR: Estimated Creatinine Clearance: 91 mL/min (by C-G formula based on SCr of 0.87 mg/dL). Liver Function Tests: Recent Labs  Lab 06/07/20 0603  AST 23  ALT 20  ALKPHOS 94  BILITOT 1.0  PROT 5.8*  ALBUMIN 2.8*   No results for input(s): LIPASE, AMYLASE in the last 168 hours. No results for input(s): AMMONIA in the last 168 hours. Coagulation Profile: No results for input(s): INR, PROTIME in the last 168 hours. Cardiac Enzymes: No results for input(s): CKTOTAL, CKMB, CKMBINDEX, TROPONINI in the last 168 hours. BNP (last 3 results) No results for input(s): PROBNP in the last 8760 hours. HbA1C: No results for input(s): HGBA1C in the last 72 hours. CBG: Recent Labs  Lab 06/12/20 1303 06/13/20 0014 06/13/20 0600 06/13/20 0806 06/13/20 1255  GLUCAP 104* 94 99 94 101*   Lipid Profile: No results for input(s): CHOL, HDL, LDLCALC, TRIG, CHOLHDL, LDLDIRECT in the last 72 hours. Thyroid Function Tests: No results for input(s): TSH, T4TOTAL, FREET4, T3FREE, THYROIDAB in the last 72 hours. Anemia Panel: Recent Labs    06/11/20 0201 06/12/20 0244  FERRITIN 143 146   Sepsis Labs: Recent Labs  Lab 06/11/20 0956  PROCALCITON <0.10    Recent Results (from the past 240 hour(s))  SARS CORONAVIRUS 2 (TAT 6-24 HRS) Nasopharyngeal Nasopharyngeal Swab     Status: Abnormal   Collection Time: 06/06/20  3:08 PM   Specimen: Nasopharyngeal Swab  Result Value Ref Range Status   SARS Coronavirus 2 POSITIVE (A) NEGATIVE Final    Comment: (NOTE) SARS-CoV-2 target nucleic acids are DETECTED.  The SARS-CoV-2 RNA is generally detectable in upper and lower respiratory specimens during the acute phase of infection. Positive results are indicative of the presence of SARS-CoV-2 RNA. Clinical correlation with patient history and other diagnostic information is  necessary to determine patient infection status. Positive results do not rule out  bacterial infection or co-infection with other viruses.  The expected result is Negative.  Fact Sheet for Patients: SugarRoll.be  Fact Sheet for Healthcare Providers: https://www.woods-mathews.com/  This test is not yet approved or cleared by the [place] FDA and  has been authorized for detection and/or diagnosis of SARS-CoV-2 by FDA under an Emergency Use Authorization (EUA). This EUA will remain  in effect (meaning this test can be used) for the duration of the COVID-19 declaration under Section 564(b)(1) of the Act, 21 U. S.C. section 360bbb-3(b)(1), unless the authorization is terminated or revoked sooner.   Performed at [facility], [place] [address] [place],  [address]          Radiology Studies: No results found.      Scheduled Meds: . diltiazem  120  mg Oral Daily  . feeding supplement  237 mL Oral BID BM  . polyethylene glycol  17 g Oral BID  . saccharomyces boulardii  250 mg Oral BID   Continuous Infusions: . ertapenem 1,000 mg (06/13/20 1308)  . heparin 2,250 Units/hr (06/13/20 1010)     LOS: 7 days      Phillips Climes, MD [facility]  If 7PM-7AM, please contact night-coverage  06/13/2020, 3:50 PM

## 2020-06-13 NOTE — Discharge Instructions (Addendum)
Low-Fiber Eating Plan Fiber is found in fruits, vegetables, whole grains, and beans. Eating a diet low in fiber helps to reduce how often you have bowel movements and the amount of stool you produce. A low-fiber eating plan may help your digestive system heal if you:  Have certain conditions, such as Crohn's disease, diverticulitis, or irritable bowel syndrome (IBS), and are having a flare-up.  Recently had radiation therapy on your pelvis or bowel.  Recently had intestinal surgery.  Have a new surgical opening in your abdomen (colostomy or ileostomy).  Have an intestine that has narrowed (stricture). Your health care provider will tell you how long to stay on this diet and may recommend that you work with a dietitian. What are tips for following this plan? Reading food labels  Check the nutrition facts label on food products for the amount of dietary fiber.  Choose foods that have less than 2 grams (g) of fiber per serving.   Cooking  Use white flour for baking and cooking.  Cook meat using methods that keep it tender, such as braising or poaching.  Cook eggs until the yolk is completely solid.  Cook with healthy oils, such as olive oil or canola oil. Meal planning  Eat 5-6 small meals throughout the day instead of 3 large meals.  If you are lactose intolerant: ? Choose low-lactose dairy foods. ? Do not eat dairy foods if told by your health care provider or dietitian.  Limit fats and oils to less than 8 teaspoons (39 mL) a day.  Eat small portions of desserts.  Limit acidic, spicy, or fried foods to reduce gas, bloating, and discomfort. General information  Follow instructions from your health care provider or dietitian about how much fiber you should have each day.  Most people on a low-fiber eating plan should eat less than 10 g of fiber a day. Your daily fiber goal is _________________ g.  Take vitamin and mineral supplements as told by your health care provider or  dietitian. Chewable or liquid forms are best when on this eating plan. A gummy vitamin is not recommended. What foods should I eat? Fruits Soft-cooked or canned fruits without skin and seeds. Ripe banana. Applesauce. Fruit juice without pulp. Vegetables Well-cooked or canned vegetables without skin, seeds, or stems. Cooked potatoes without skins. Vegetable juice. Grains All bread and crackers made with white flour. Waffles, pancakes, and [place] toast. Bagels. Pretzels. Melba toast, zwieback, and matzoh. Cooked and dried cereals that do not have whole grains, added fiber, seeds, or dried fruit. Domenick Gong. Hot and cold cereals made with refined corn, rice, or oats. Plain pasta and noodles. White rice. Meats and other proteins Ground meat. Tender cuts of meat or poultry. Eggs. Fish, seafood, and shellfish. Smooth nut butters. Tofu. Dairy All milk products and drinks. Lactose-free milk, including rice, soy, and almond milk. Yogurt without fruit, nuts, chocolate, or granola mixed in. Sour cream. Cottage cheese. Cheese. Fats and oils Olive oil, canola oil, sunflower oil, flaxseed oil, avocado oil, and grapeseed oil. Mayonnaise. Cream cheese. Margarine. Butter. Beverages Decaf coffee. Fruit and vegetable juices. Smoothies (in small amounts, with no pulp or skins, and with fruits from the recommended list). Sports drinks. Herbal tea. Water. Sweets and desserts Plain cakes. Cookies. Cream pies and pies made with recommended fruits. Pudding. Custard. Fruit gelatin. Sherbet. Ice pops. Ice cream without nuts. Hard candy. Honey. Jelly. Molasses. Syrups. Chocolate. Marshmallows. Gumdrops. Seasonings and condiments Ketchup. Mild mustard. Mild salad dressings. Plain gravies. Vinegar. Spices in  moderation. Salt. Sugar. Other foods Bouillon. Broth. Cream and strained soups made from recommended foods. Casseroles made with recommended foods. The items listed above may not be a complete list of foods and beverages  you can eat. Contact a dietitian for more information. What foods should I avoid? Fruits Raw or dried fruit. Berries. Fruit juice with pulp. Prune juice. Vegetables Potato skins. Raw or undercooked vegetables. All beans and bean sprouts. Cooked greens. Corn. Peas. Cabbage. Beets. Broccoli. Brussels sprouts. Cauliflower. Mushrooms. Onions. Peppers. Parsnips. Okra. Sauerkraut. Grains Whole-wheat, whole-grain, or multigrain breads, cereals, or crackers. Rye bread. Cereals with nuts, raisins, or coconut. Bran. Granola. High-fiber cereals. Cornmeal or corn bread. Whole-grain pasta. Wild or brown rice. Quinoa. Popcorn. Buckwheat. Wheat germ. Meats and other proteins Tough, fibrous meats with gristle. Fatty meat. Poultry with skin. Fried meat, Sales executive, or fish. Precooked or cured meat, such as sausages or meat loaves. Berniece Salines. Hot dogs. Nuts and chunky nut butter. Dried peas, beans, and lentils. Hummus. Dairy Yogurt with fruit, nuts, chocolate, or granola mixed in. Full-fat dairy such as whole milk, ice cream, or sour cream. Beverages Caffeinated coffee and teas. Fats and oils Avocado. Coconut. Butter. Sweets and desserts Desserts, cookies, or candies that contain nuts or coconut. Dried fruit. Jams and preserves with seeds. Marmalade. Any dessert made with fruits or grains that are not recommended. Seasonings and condiments Relish. Horseradish. Angie Fava. Olives. Other foods Corn tortilla chips. Soups made with vegetables or grains that are not recommended. The items listed above may not be a complete list of foods and beverages you should avoid. Contact a dietitian for more information. Summary  Most people on a low-fiber eating plan should eat less than 10 grams of fiber a day. Follow recommendations from your health care provider or dietitian about how much fiber you should have each day.  Always check nutrition facts labels to see the dietary fiber amount in packaged foods. A low-fiber food will  have less than 2 grams of fiber per serving.  Try to avoid whole grains, raw fruits and vegetables, dried fruit, tough cuts of meat, nuts, and seeds.  Take a vitamin and mineral supplement as told by your health care provider or dietitian. This information is not intended to replace advice given to you by your health care provider. Make sure you discuss any questions you have with your health care provider. Document Revised: 09/07/2019 Document Reviewed: 09/07/2019 Elsevier Patient Education  2021 [facility], [place] [phone]  OPEN ABDOMINAL SURGERY: POST OP INSTRUCTIONS  Always review your discharge instruction sheet given to you by the facility where your surgery was performed.  IF YOU HAVE DISABILITY OR FAMILY LEAVE FORMS, YOU MUST BRING THEM TO THE OFFICE FOR PROCESSING.  PLEASE DO NOT GIVE THEM TO YOUR DOCTOR.  1. A prescription for pain medication may be given to you upon discharge.  Take your pain medication as prescribed, if needed.  If narcotic pain medicine is not needed, then you may take acetaminophen (Tylenol) or ibuprofen (Advil) as needed. 2. Take your usually prescribed medications unless otherwise directed. 3. If you need a refill on your pain medication, please contact your pharmacy. They will contact our office to request authorization.  Prescriptions will not be filled after 5pm or on week-ends. 4. You should follow a light diet the first few days after arrival home, such as soup and crackers, pudding, etc.unless your doctor has advised otherwise. A high-fiber, low fat diet can be resumed  as tolerated.   Be sure to include lots of fluids daily. Most patients will experience some swelling and bruising on the chest and neck area.  Ice packs will help.  Swelling and bruising can take several days to resolve 5. Most patients will experience some swelling and bruising in the area of the incision. Ice pack will help. Swelling and bruising can  take several days to resolve..  6. It is common to experience some constipation if taking pain medication after surgery.  Increasing fluid intake and taking a stool softener will usually help or prevent this problem from occurring.  A mild laxative (Milk of Magnesia or Miralax) should be taken according to package directions if there are no bowel movements after 48 hours. 7.  You may have steri-strips (small skin tapes) in place directly over the incision.  These strips should be left on the skin for 7-10 days.  If your surgeon used skin glue on the incision, you may shower in 24 hours.  The glue will flake off over the next 2-3 weeks.  Any sutures or staples will be removed at the office during your follow-up visit. You may find that a light gauze bandage over your incision may keep your staples from being rubbed or pulled. You may shower and replace the bandage daily. 8. ACTIVITIES:  You may resume regular (light) daily activities beginning the next day--such as daily self-care, walking, climbing stairs--gradually increasing activities as tolerated.  You may have sexual intercourse when it is comfortable.  Refrain from any heavy lifting or straining until approved by your doctor. a. You may drive when you no longer are taking prescription pain medication, you can comfortably wear a seatbelt, and you can safely maneuver your car and apply brakes b. Return to Work: ___________________________________ 24. You should see your doctor in the office for a follow-up appointment approximately two weeks after your surgery.  Make sure that you call for this appointment within a day or two after you arrive home to insure a convenient appointment time. OTHER INSTRUCTIONS:  _____________________________________________________________ _____________________________________________________________  WHEN TO CALL YOUR DOCTOR: 1. Fever over 101.0 2. Inability to urinate 3. Nausea and/or vomiting 4. Extreme swelling or  bruising 5. Continued bleeding from incision. 6. Increased pain, redness, or drainage from the incision. 7. Difficulty swallowing or breathing 8. Muscle cramping or spasms. 9. Numbness or tingling in hands or feet or around lips.  The clinic staff is available to answer your questions during regular business hours.  Please don't hesitate to call and ask to speak to one of the nurses if you have concerns.  For further questions, please visit [email or web address]    Colostomy Home Guide, Adult  Colostomy surgery is done to create an opening in the front of the abdomen for stool (feces) to leave the body through an ostomy (stoma). Part of the large intestine is attached to the stoma. A bag, also called a pouch, is fitted over the stoma. Stool and gas will collect in the bag. After surgery, you will need to empty and change your colostomy bag as needed. You will also need to care for your stoma. How to care for the stoma Your stoma should look pink, red, and moist, like the inside of your cheek. Soon after surgery, the stoma may be swollen, but this swelling will go away within 6 weeks. To care for the stoma:  Keep the skin around the stoma clean and dry.  Use a clean, soft washcloth to gently wash the  stoma and the skin around it. Clean using a circular motion, and wipe away from the stoma opening, not toward it. ? Use warm water and only use cleansers recommended by your health care provider. ? Rinse the stoma area with plain water. ? Dry the area around the stoma well.  Use stoma powder or ointment on your skin only as told by your health care provider. Do not use any other powders, gels, wipes, or creams on the skin around the stoma.  Check the stoma area every day for signs of infection. Check for: ? New or worsening redness, swelling, or pain. ? New or increased fluid or blood. ? Pus or warmth.  Measure the stoma opening regularly and record the size. Watch for changes.  (It is normal for the stoma to get smaller as swelling goes away.) Share this information with your health care provider. How to empty the colostomy bag Empty your bag at bedtime and whenever it is one-third to one-half full. Do not let the bag get more than half-full with stool or gas. The bag could leak if it gets too full. Some colostomy bags have a built-in gas release valve that releases gas often throughout the day. Follow these basic steps: 1. Wash your hands with soap and water. 2. Sit far back on the toilet seat. 3. Put several pieces of toilet paper into the toilet water. This will prevent splashing as you empty stool into the toilet. 4. Remove the clip or the hook-and-loop fastener from the tail end of the bag. 5. Unroll the tail, then empty the stool into the toilet. 6. Clean the tail with toilet paper or a moist towelette. 7. Reroll the tail, and close it with the clip or the hook-and-loop fastener. 8. Wash your hands again.   How to change the colostomy bag Change your bag every 3-4 days or as often as told by your health care provider. Also change the bag if it is leaking or separating from the skin, or if your skin around the stoma looks or feels irritated. Irritated skin may be a sign that the bag is leaking. Always have colostomy supplies with you, and follow these basic steps: 1. Wash your hands with soap and water. Have paper towels or tissues nearby to clean any discharge. 2. Remove the old bag and skin barrier. Use your fingers or a warm cloth to gently push the skin away from the barrier. 3. Clean the stoma area with water or with mild soap and water, as directed. Use water to rinse away any soap. 4. Dry the skin. You may use the cool setting on a hair dryer to do this. 5. Use a tracing pattern (template) to cut the skin barrier to the size needed. 6. If you are using a two-piece bag, attach the bag and the skin barrier to each other. Add the barrier ring, if you use  one. 7. If directed, apply stoma powder or skin barrier gel to the skin. 8. Warm the skin barrier with your hands, or blow with a hair dryer for 5-10 seconds. 9. Remove the paper from the adhesive strip of the skin barrier. 10. Press the adhesive strip onto the skin around the stoma. 11. Gently rub the skin barrier onto the skin. This creates heat that helps the barrier to stick. 12. Apply stoma tape to the edges of the skin barrier, if desired. 53. Wash your hands again. General recommendations  Avoid wearing tight clothes or having anything press directly  on your stoma or bag. Change your clothing whenever it is soiled or damp.  You may shower or bathe with the bag on or off. Do not use harsh or oily soaps or lotions. Dry the skin and bag after bathing.  Store all supplies in a cool, dry place. Do not leave supplies in extreme heat because some parts can melt or not stick as well.  Whenever you leave home, take extra clothing and an extra skin barrier and bag with you.  If your bag gets wet, you can dry it with a hair dryer on the cool setting.  To prevent odor, you may put drops of ostomy deodorizer in the bag.  If recommended by your health care provider, put ostomy lubricant inside the bag. This helps stool to slide out of the bag more easily and completely. Contact a health care provider if:  You have new or worsening redness, swelling, or pain around your stoma.  You have new or increased fluid or blood coming from your stoma.  Your stoma feels warm to the touch.  You have pus coming from your stoma.  Your stoma extends in or out farther than normal.  You need to change your bag every day.  You have a fever. Get help right away if:  Your stool is bloody.  You have nausea or you vomit.  You have trouble breathing. Summary  Measure your stoma opening regularly and record the size. Watch for changes.  Empty your bag at bedtime and whenever it is one-third to  one-half full. Do not let the bag get more than half-full with stool or gas.  Change your bag every 3-4 days or as often as told by your health care provider.  Whenever you leave home, take extra clothing and an extra skin barrier and bag with you. This information is not intended to replace advice given to you by your health care provider. Make sure you discuss any questions you have with your health care provider. Document Revised: 01/04/2020 Document Reviewed: 01/04/2020 Elsevier Patient Education  2021 [place].  Negative Pressure Wound Therapy Home Guide Negative pressure wound therapy (NPWT) uses a sponge or foam-like material (dressing) placed on or inside the wound. The wound is then covered and sealed with a cover dressing that sticks to your skin (is adhesive). This keeps air out. A tube is attached to the cover dressing, and this tube connects to a small pump. The pump sucks fluid and germs from the wound. NPWT helps to increase blood flow to the wound and heal it from the inside. What are the risks? NPWT is usually safe to use. However, problems can occur, including:  Skin irritation from the dressing adhesive.  Bleeding.  Infection.  Dehydration. Wounds with large amounts of drainage can cause excessive fluid loss.  Pain. Supplies needed:  A disposable garbage bag.  Soap and water, or hand sanitizer.  Wound cleanser or salt-water solution (saline).  New sponge and cover dressing.  Protective clothing.  Gauze pad.  Vinyl gloves.  Tape.  Skin protectant. This may be a wipe, film, or spray.  Clean or germ-free (sterile) scissors.  Eye protection. How to change your dressing Prepare to change your dressing 1. If told by your health care provider, take pain medicine 30 minutes before changing the dressing. 2. Wash your hands with soap and water. Dry your hands with a clean towel. If soap and water are not available, use hand sanitizer. 3. Set up a clean  station for wound care. 4. Open the dressing package so that the sponge dressing remains on the inside of the package. 5. Wear gloves, protective clothing, and eye protection.   Remove old dressing 1. Turn off the pump and disconnect the tubing from the dressing. 2. Carefully remove the adhesive cover dressing in the direction of your hair growth. 3. Remove the sponge dressing that is inside the wound. If the sponge sticks, use a wound cleanser or saline solution to wet the sponge and help it come off more easily. 4. Throw the old sponge and cover dressing supplies into the garbage bag. 5. Remove your gloves by grabbing the cuff and turning the glove inside out. Place the gloves in the trash immediately. 6. Wash your hands with soap and water. Dry your hands with a clean towel. If soap and water are not available, use hand sanitizer.   Clean your wound  Wear gloves, protective clothing, and eye protection. Follow your health care provider's instructions on how to clean your wound. You may be told to: 1. Clean the wound using a saline solution or a wound cleanser and a clean gauze pad. 2. Pat the wound dry with a gauze pad. Do not rub the wound. 3. Throw the gauze pad into the garbage bag. 4. Remove your gloves by grabbing the cuff and turning the glove inside out. Place the gloves in the trash immediately. 5. Wash your hands with soap and water. Dry your hands with a clean towel. If soap and water are not available, use hand sanitizer. Apply new dressing  Wear gloves, protective clothing, and eye protection. 1. If told by your health care provider, apply a skin protectant to any skin that will be exposed to adhesive. Let the skin protectant dry. 2. Cut a piece of new sponge dressing and put it on or in the wound. 3. Using clean scissors, cut a nickel-sized hole in the new cover dressing. 4. Apply the cover dressing. 5. Attach the suction tube over the hole in the cover dressing. 6. Take  off your gloves. Put them in the plastic bag with the old dressing. Tie the bag shut and throw it away. 7. Wash your hands with soap and water. Dry your hands with a clean towel. If soap and water are not available, use hand sanitizer. 8. Turn the pump back on. The sponge dressing should collapse. Do not change the settings on the machine without talking to a health care provider. 9. Replace the container in the pump that collects fluid if it is full. Replace the container per the manufacturer's instructions or at least once a week, even if it is not full. General tips and recommendations If the alarm sounds:  Stay calm.  Do not turn off the pump or do anything with the dressing.  Reasons the alarm may go off: ? The battery is low. Change the battery or plug the device into electrical power. ? The dressing has a leak. Find the leak and put tape over the leak. ? The fluid collection container is full. Change the fluid container.  Call your health care provider right away if you cannot fix the problem.  Explain to your health care provider what is happening. Follow his or her instructions. General instructions  Do not turn off the pump unless told to do so by your health care provider.  Do not turn off the pump for more than 2 hours. If the pump is off for more than 2 hours,  the dressing will need to be changed.  If your health care provider says it is okay to shower: ? Do not take the pump into the shower. ? Make sure the wound dressing is protected and sealed. The wound dressing must stay dry.  Check frequently that the machine indicates that therapy is on and that all clamps are open.  Do not use over-the-counter medicated or antiseptic creams, sprays, liquids, or dressings unless your health care provider approves. Contact a health care provider if:  You have new pain.  You develop irritation, a rash, or itching around the wound or dressing.  You see new black or yellow tissue  in your wound.  The dressing changes are painful or cause bleeding.  The pump has been off for more than 2 hours, and you do not know how to change the dressing.  The pump alarm goes off, and you do not know what to do. Get help right away if:  You have a lot of bleeding.  The wound breaks open.  You have severe pain.  You have signs of infection, such as: ? More redness, swelling, or pain. ? More fluid or blood. ? Warmth. ? Pus or a bad smell. ? Red streaks leading from the wound. ? A fever.  You see a sudden change in the color or texture of the drainage.  You have signs of dehydration, such as: ? Little or no tears, urine, or sweat. ? Muscle cramps. ? Very dry mouth. ? Headache. ? Dizziness. Summary  Negative pressure wound therapy (NPWT) is a device that helps your wound heal.  Set up a clean station for wound care. Your health care provider will tell you what supplies to use.  Follow your health care provider's instructions on how to clean your wound and how to change the dressing.  Contact a health care provider if you have new pain, an irritation, or a rash, or if the alarm goes off and you do not know what to do.  Get help right away if you have a lot of bleeding, your wound breaks open, or you have severe pain. Also, get help if you have signs of infection. This information is not intended to replace advice given to you by your health care provider. Make sure you discuss any questions you have with your health care provider. Document Revised: 07/10/2019 Document Reviewed: 07/22/2018 Elsevier Patient Education  [place].

## 2020-06-13 NOTE — Progress Notes (Signed)
ANTICOAGULATION CONSULT NOTE - Follow Up Consult  Pharmacy Consult for IV Heparin Indication: atrial fibrillation  No Known Allergies  Patient Measurements: Height: 5' 8"  (172.7 cm) Weight: 111.1 kg (244 lb 14.9 oz) IBW/kg (Calculated) : 63.9 Heparin Dosing Weight: 89 kg  Vital Signs: Temp: 98.2 F (36.8 C) (01/27 0445) Temp Source: Oral (01/27 0445) BP: 106/93 (01/27 0445) Pulse Rate: 98 (01/27 0445)  Labs: Recent Labs    06/11/20 0201 06/12/20 0244 06/12/20 1944 06/13/20 0500  HGB 9.9* 9.8*  --  10.3*  HCT 32.9* 32.4*  --  32.1*  PLT 253 254  --  280  HEPARINUNFRC 0.52 0.48  --  0.46  CREATININE 0.80 0.81 0.87  --     Estimated Creatinine Clearance: 91 mL/min (by C-G formula based on SCr of 0.87 mg/dL).   Medical History: Past Medical History:  Diagnosis Date  . Dysrhythmia   . PAF (paroxysmal atrial fibrillation) ([place])   . Tobacco abuse     Assessment: Pt was recently discharged from [facility] for diverticulitis with possible abscess. She is on chronic apixaban for afib (last dose on 1/19). She presented with worsening abd pain. Plan is to hold apixaban and bridge with IV heparin for possible intervention if needed. Pharmacy was consulted to dose IV heparin. Patient has had vaginal bleeding during admission, but no bleeding noted at this time.   HL this AM is within the goal range at 0.46. Hgb is stable 10.3 and PLTs 280.   Goal of Therapy:  Heparin level 0.3-0.7 units/ml Monitor platelets by anticoagulation protocol: Yes   Plan:  Continue heparin infusion at 2250 units/hr Monitor daily heparin level, CBC Monitor for bleeding  Tinia Oravec A. Levada Dy, PharmD, BCPS, FNKF Clinical Pharmacist [place] Please utilize Amion for appropriate phone number to reach the unit pharmacist ([place])  06/13/2020 8:09 AM

## 2020-06-13 NOTE — Progress Notes (Signed)
[facility] Progress Note     Subjective: CC-  Overall improved. Continues to have some LLQ abdominal pain but it is not severe. Denies n/v. Tolerating a diet but states that she is almost afraid to eat too much. Semi-solid BM yesterday.  WBC WNL, afebrile.  Objective: Vital signs in last 24 hours: Temp:  [97.8 F (36.6 C)-99.2 F (37.3 C)] 98.2 F (36.8 C) (01/27 0445) Pulse Rate:  [75-98] 98 (01/27 0445) Resp:  [14-20] 20 (01/27 0445) BP: (106-125)/(82-93) 106/93 (01/27 0445) SpO2:  [92 %-95 %] 95 % (01/27 0445) Last BM Date: 06/11/20  Intake/Output from previous day: 01/26 0701 - 01/27 0700 In: 2134.6 [P.O.:720; I.V.:1314.6; IV Piggyback:100] Out: -  Intake/Output this shift: No intake/output data recorded.  PE: Gen: Alert, NAD, pleasant Pulm:Rate and effort normalat rest Abd: Soft,ND,minimaltenderness of the LLQ without peritonitis,+BS Ext: No LE edema Psych: A&Ox3  Skin: no rashes noted, warm and dry   Lab Results:  Recent Labs    06/12/20 0244 06/13/20 0500  WBC 7.1 6.8  HGB 9.8* 10.3*  HCT 32.4* 32.1*  PLT 254 280   BMET Recent Labs    06/12/20 0244 06/12/20 1944  NA 137 137  K 3.9 4.2  CL 104 102  CO2 25 23  GLUCOSE 92 99  BUN 7 8  CREATININE 0.81 0.87  CALCIUM 9.2 9.0   PT/INR No results for input(s): LABPROT, INR in the last 72 hours. CMP     Component Value Date/Time   NA 137 06/12/2020 1944   NA 141 07/19/2019 0840   K 4.2 06/12/2020 1944   CL 102 06/12/2020 1944   CO2 23 06/12/2020 1944   GLUCOSE 99 06/12/2020 1944   BUN 8 06/12/2020 1944   BUN 13 07/19/2019 0840   CREATININE 0.87 06/12/2020 1944   CREATININE 0.88 09/09/2016 1146   CALCIUM 9.0 06/12/2020 1944   PROT 5.8 (L) 06/07/2020 0603   ALBUMIN 2.8 (L) 06/07/2020 0603   AST 23 06/07/2020 0603   ALT 20 06/07/2020 0603   ALKPHOS 94 06/07/2020 0603   BILITOT 1.0 06/07/2020 0603   GFRNONAA >60 06/12/2020 1944   GFRNONAA 74 09/09/2016 1146   GFRAA  105 07/19/2019 0840   GFRAA 86 09/09/2016 1146   Lipase     Component Value Date/Time   LIPASE 25 05/29/2020 0951       Studies/Results: DG Chest Port 1 View  Result Date: 06/11/2020 CLINICAL DATA:  Dyspnea.  COVID positive EXAM: PORTABLE CHEST 1 VIEW COMPARISON:  10/11/2017 FINDINGS: Cardiac enlargement without heart failure. Lungs are clear without infiltrate or effusion. IMPRESSION: No active disease. Electronically Signed   By: Franchot Gallo M.D.   On: 06/11/2020 10:22    Anti-infectives: Anti-infectives (From admission, onward)   Start     Dose/Rate Route Frequency Ordered Stop   06/11/20 1230  ertapenem (INVANZ) 1,000 mg in sodium chloride 0.9 % 100 mL IVPB        1 g 200 mL/hr over 30 Minutes Intravenous Every 24 hours 06/11/20 1114     06/06/20 1850  piperacillin-tazobactam (ZOSYN) IVPB 3.375 g  Status:  Discontinued        3.375 g 12.5 mL/hr over 240 Minutes Intravenous Every 8 hours 06/06/20 1850 06/11/20 1114   06/06/20 1830  piperacillin-tazobactam (ZOSYN) IVPB 3.375 g        3.375 g 100 mL/hr over 30 Minutes Intravenous  Once 06/06/20 1818 06/06/20 2039       Assessment/Plan Paroxysmal A. Fib  on Eliquis at home  OSA HTN COVID +  Acute sigmoid diverticulitis with small phlegmon/ abscess in distal sigmoid colon - Initially dx w/ diverticulitis on 1/12 and d/c with Augmentin. Failed outpatient therapy. CT 1/20 showed sigmoid diverticulitis w/ small phlegmon vs abscess just inferior to distal sigmoid colon measuring3.4 x 3.3 x 2.7 cm. Our team felt this was to small for IR drainage. - repeat CT 1/24 shows overall no significant improvement in the inflammatory changes of the sigmoid colon; small amount of partially loculated fluid or phlegmon to the left of the sigmoid colonwith no discrete drainable fluid collection;2.2 x 1.8 cm ill-defined inflammatory area inferior to the sigmoid colon (76/3); no extraluminal air, no bowel obstruction - Hopefully patient  will improve with conservative tx. We discussed if she was to worsen and require surgery during admission, this would likely result in a colostomy.With current covid diagnosis she is a poor surgical candidate due to increased risk of respiratory complications. - If patient improves with conservative tx and aviods surgery, would recommend a colonoscopy in 4-6 weeks. She denies hx of colonoscopy in the past but has made arrangements to see GI at [facility] after discharge.  FEN -IVF, soft diet, Boost VTE -SCDs, heparin gtt (Eliquis on hold) ID -Zosyn1/20>>1/25, invanz 1/25>>  Plan:Ok for discharge from surgical standpoint. Discussed with primary team, recommend transitioning to oral cipro/flagyl when discharged to complete 2 total weeks of antibiotics between the invanz and cipro/flagyl. Diet recommendations on AVS. Patient plans to follow up with GI after discharge, and plan for colonoscopy in 6-8 weeks.   LOS: 7 days    [facility] 06/13/2020, 9:12 AM Please see Amion for pager number during day hours 7:00am-4:30pm

## 2020-06-14 ENCOUNTER — Other Ambulatory Visit (HOSPITAL_COMMUNITY): Payer: Self-pay | Admitting: Internal Medicine

## 2020-06-14 ENCOUNTER — Inpatient Hospital Stay (HOSPITAL_COMMUNITY): Payer: No Typology Code available for payment source

## 2020-06-14 LAB — GLUCOSE, CAPILLARY
Glucose-Capillary: 106 mg/dL — ABNORMAL HIGH (ref 70–99)
Glucose-Capillary: 97 mg/dL (ref 70–99)
Glucose-Capillary: 107 mg/dL — ABNORMAL HIGH (ref 70–99)
Glucose-Capillary: 134 mg/dL — ABNORMAL HIGH (ref 70–99)

## 2020-06-14 LAB — BASIC METABOLIC PANEL
Anion gap: 10 (ref 5–15)
BUN: <5 mg/dL — ABNORMAL LOW (ref 6–20)
CO2: 26 mmol/L (ref 22–32)
Calcium: 9.3 mg/dL (ref 8.9–10.3)
Chloride: 102 mmol/L (ref 98–111)
Creatinine, Ser: 0.84 mg/dL (ref 0.44–1.00)
GFR, Estimated: >60 mL/min (ref >60)
Glucose, Bld: 100 mg/dL — ABNORMAL HIGH (ref 70–99)
Potassium: 3.9 mmol/L (ref 3.5–5.1)
Sodium: 138 mmol/L (ref 135–145)

## 2020-06-14 LAB — CBC
HCT: 34.2 % — ABNORMAL LOW (ref 36.0–46.0)
Hemoglobin: 10.4 g/dL — ABNORMAL LOW (ref 12.0–15.0)
MCH: 26.4 pg (ref 26.0–34.0)
MCHC: 30.4 g/dL (ref 30.0–36.0)
MCV: 86.8 fL (ref 80.0–100.0)
Platelets: 266 10*3/uL (ref 150–400)
RBC: 3.94 MIL/uL (ref 3.87–5.11)
RDW: 16.1 % — ABNORMAL HIGH (ref 11.5–15.5)
WBC: 5.8 10*3/uL (ref 4.0–10.5)
nRBC: 0 % (ref 0.0–0.2)

## 2020-06-14 LAB — HEPARIN LEVEL (UNFRACTIONATED): Heparin Unfractionated: 0.38 IU/mL (ref 0.30–0.70)

## 2020-06-14 MED ORDER — POLYETHYLENE GLYCOL 3350 17 G PO PACK
34.0000 g | PACK | Freq: Once | ORAL | Status: AC
  Administered 2020-06-14: 34 g via ORAL
  Filled 2020-06-14: qty 2

## 2020-06-14 MED ORDER — CIPROFLOXACIN HCL 500 MG PO TABS: 500.0000 mg | ORAL_TABLET | Freq: Two times a day (BID) | ORAL | 0 refills | Status: DC

## 2020-06-14 MED ORDER — APIXABAN 5 MG PO TABS: 5.0000 mg | ORAL_TABLET | Freq: Two times a day (BID) | ORAL | 0 refills | Status: DC

## 2020-06-14 MED ORDER — BISACODYL 10 MG RE SUPP
10.0000 mg | Freq: Once | RECTAL | Status: AC
  Administered 2020-06-14: 10 mg via RECTAL
  Filled 2020-06-14: qty 1

## 2020-06-14 MED ORDER — METRONIDAZOLE 500 MG PO TABS: 500.0000 mg | ORAL_TABLET | Freq: Three times a day (TID) | ORAL | 0 refills | Status: DC

## 2020-06-14 MED ORDER — DILTIAZEM HCL ER COATED BEADS 120 MG PO CP24: 120.0000 mg | ORAL_CAPSULE | Freq: Every day | ORAL | 0 refills | Status: DC

## 2020-06-14 MED FILL — metroNIDAZOLE 500 MG TABS: 500 | 10 days supply | Qty: 30 | Fill #0

## 2020-06-14 MED FILL — CIPROFLOXACIN HCL 500 MG TA: 500 | 10 days supply | Qty: 20 | Fill #0

## 2020-06-14 NOTE — Progress Notes (Signed)
ANTICOAGULATION CONSULT NOTE - Follow Up Consult  Pharmacy Consult for IV Heparin Indication: atrial fibrillation  No Known Allergies  Patient Measurements: Height: 5' 8"  (172.7 cm) Weight: 111.1 kg (244 lb 14.9 oz) IBW/kg (Calculated) : 63.9 Heparin Dosing Weight: 89 kg  Vital Signs: Temp: 98.3 F (36.8 C) (01/28 0431) Temp Source: Oral (01/28 0431) BP: 137/87 (01/28 0431) Pulse Rate: 99 (01/28 0431)  Labs: Recent Labs    06/12/20 0244 06/12/20 1944 06/13/20 0500 06/14/20 0258  HGB 9.8*  --  10.3*  --   HCT 32.4*  --  32.1*  --   PLT 254  --  280  --   HEPARINUNFRC 0.48  --  0.46 0.38  CREATININE 0.81 0.87  --   --     Estimated Creatinine Clearance: 91 mL/min (by C-G formula based on SCr of 0.87 mg/dL).   Medical History: Past Medical History:  Diagnosis Date  . Dysrhythmia   . PAF (paroxysmal atrial fibrillation) ([place])   . Tobacco abuse     Assessment: Pt was recently discharged from [facility] for diverticulitis with possible abscess. She is on chronic apixaban for afib (last dose on 1/19). She presented with worsening abd pain. Plan is to hold apixaban and bridge with IV heparin for possible intervention if needed. Pharmacy was consulted to dose IV heparin. Patient has had vaginal bleeding during admission, but no bleeding noted at this time.   HL this AM is within the goal range at 0.38. Hgb is stable 10.3 and PLTs 280.   Goal of Therapy:  Heparin level 0.3-0.7 units/ml Monitor platelets by anticoagulation protocol: Yes   Plan:  Continue heparin infusion at 2250 units/hr Monitor daily heparin level, CBC Monitor for bleeding F/u change to apixaban on discharge per primary  Sherman Lipuma A. Levada Dy, PharmD, BCPS, FNKF Clinical Pharmacist [place] Please utilize Amion for appropriate phone number to reach the unit pharmacist ([place])  06/14/2020 7:38 AM

## 2020-06-14 NOTE — Progress Notes (Signed)
PROGRESS NOTE    Tammy Davies  EXB:[id number] DOB: 22-Jun-1960 DOA: 06/05/2020 PCP: Patient, No Pcp Per   Brief Narrative:   60 year old with history of paroxysmal A. fib on anticoagulation, tobacco use, obstructive sleep apnea on CPAP, HTN admitted for abdominal pain which has been ongoing for the past month which previously was treated as constipation and then uncomplicated diverticulitis about a week ago with Augmentin.  She presents with worsening of her symptoms.  CT abdomen pelvis showed sigmoid diverticulitis with small abscess.  General surgery consulted and started on Zosyn. Repeat CT showed persistent diverticulitis with absces     Subjective:  Reports poor appetite, feeling bloated, reports worsening abdominal pain.  Assessment & Plan:   Active Problems:   Acute diverticulitis  Acute complicated diverticulitis with small abscess -Conservative management, pain control -Advance diet per general surgery -Repeat CT A/P-shows persistent diverticulitis.  So she has been switched from IV Zosyn to Invanz, abdominal pain has improved today, she is tolerating p.o. intake, discussed with general surgery, will he can be discharged in 24 hours on p.o. regimen including Cipro as an Flagyl to finish total of 2 weeks . - No role for general surgery or IR drain at the moment but this may change -Outpatient colonoscopy in 6 weeks -Patient with worsening abdominal pain today, but stable white blood cell count, afebrile, GI series with no evidence of obstruction, but significant stool burden, will increase her laxatives.  COVID-19 infection, mainly asymptomatic - Asymptomatic.  Supportive care.  Chest x-ray 1/25 -negative.     history of paroxysmal atrial fibrillation - On Cardizem.  Eliquis on hold.  Continue heparin drip until cleared by General surgery - Monitor hemoglobin closely. -Heart rate was earlier uncontrolled, but currently has improved after receiving her  Cardizem.  Essential hypertension -continue home Cardizem   DVT prophylaxis: Heparin drip Code Status: Full code Family Communication: Patient is awake, alert and appropriate, I have discussed her case in details with her, and answered all her questions.  Status is: Inpatient  Remains inpatient appropriate because:Inpatient level of care appropriate due to severity of illness   Dispo: The patient is from: Home              Anticipated d/c is to: Home              Anticipated d/c date is: 1 day              Patient currently is not medically stable to d/c.    Body mass index is 36.97 kg/m.   Examination:  Awake Alert, Oriented X 3, No new F.N deficits, Normal affect Symmetrical Chest wall movement, Good air movement bilaterally, CTAB RRR,No Gallops,Rubs or new Murmurs, No Parasternal Heave +ve B.Sounds, Abd Soft, gastric, left lower quadrant tenderness,, No rebound - guarding or rigidity. No Cyanosis, Clubbing or edema, No new Rash or bruise     Objective: Vitals:   06/13/20 2258 06/14/20 0431 06/14/20 1100 06/14/20 1203  BP:  137/87  (!) 117/105  Pulse:  99  99  Resp: 18 16  20   Temp:  98.3 F (36.8 C)    TempSrc:  Oral  Oral  SpO2:  92%  93%  Weight:   110.3 kg   Height:        Intake/Output Summary (Last 24 hours) at 06/14/2020 1621 Last data filed at 06/14/2020 0431 Gross per 24 hour  Intake 480 ml  Output -  Net 480 ml   [employer]  06/05/20 2219 06/14/20 1100  Weight: 111.1 kg 110.3 kg     Data Reviewed:   CBC: Recent Labs  Lab 06/10/20 0026 06/11/20 0201 06/12/20 0244 06/13/20 0500 06/14/20 1109  WBC 5.3 5.7 7.1 6.8 5.8  HGB 9.6* 9.9* 9.8* 10.3* 10.4*  HCT 30.7* 32.9* 32.4* 32.1* 34.2*  MCV 86.2 86.8 86.9 84.7 86.8  PLT 230 253 254 280 759   Basic Metabolic Panel: Recent Labs  Lab 06/08/20 0400 06/09/20 0223 06/10/20 0026 06/11/20 0201 06/12/20 0244 06/12/20 1944 06/14/20 1109  NA 137 136 136 138 137 137 138  K 3.8 3.5 3.6  3.9 3.9 4.2 3.9  CL 106 104 105 104 104 102 102  CO2 20* 24 23 23 25 23 26   GLUCOSE 77 94 110* 102* 92 99 100*  BUN 14 9 7  <5* 7 8 <5*  CREATININE 0.80 0.77 0.78 0.80 0.81 0.87 0.84  CALCIUM 8.3* 8.5* 8.6* 9.2 9.2 9.0 9.3  MG 1.8 1.9 1.9 2.0 1.9  --   --    GFR: Estimated Creatinine Clearance: 93.9 mL/min (by C-G formula based on SCr of 0.84 mg/dL). Liver Function Tests: No results for input(s): AST, ALT, ALKPHOS, BILITOT, PROT, ALBUMIN in the last 168 hours. No results for input(s): LIPASE, AMYLASE in the last 168 hours. No results for input(s): AMMONIA in the last 168 hours. Coagulation Profile: No results for input(s): INR, PROTIME in the last 168 hours. Cardiac Enzymes: No results for input(s): CKTOTAL, CKMB, CKMBINDEX, TROPONINI in the last 168 hours. BNP (last 3 results) No results for input(s): PROBNP in the last 8760 hours. HbA1C: No results for input(s): HGBA1C in the last 72 hours. CBG: Recent Labs  Lab 06/13/20 1255 06/13/20 1702 06/13/20 2130 06/14/20 0724 06/14/20 1201  GLUCAP 101* 106* 104* 106* 97   Lipid Profile: No results for input(s): CHOL, HDL, LDLCALC, TRIG, CHOLHDL, LDLDIRECT in the last 72 hours. Thyroid Function Tests: No results for input(s): TSH, T4TOTAL, FREET4, T3FREE, THYROIDAB in the last 72 hours. Anemia Panel: Recent Labs    06/12/20 0244  FERRITIN 146   Sepsis Labs: Recent Labs  Lab 06/11/20 0956  PROCALCITON <0.10    Recent Results (from the past 240 hour(s))  SARS CORONAVIRUS 2 (TAT 6-24 HRS) Nasopharyngeal Nasopharyngeal Swab     Status: Abnormal   Collection Time: 06/06/20  3:08 PM   Specimen: Nasopharyngeal Swab  Result Value Ref Range Status   SARS Coronavirus 2 POSITIVE (A) NEGATIVE Final    Comment: (NOTE) SARS-CoV-2 target nucleic acids are DETECTED.  The SARS-CoV-2 RNA is generally detectable in upper and lower respiratory specimens during the acute phase of infection. Positive results are indicative of the  presence of SARS-CoV-2 RNA. Clinical correlation with patient history and other diagnostic information is  necessary to determine patient infection status. Positive results do not rule out bacterial infection or co-infection with other viruses.  The expected result is Negative.  Fact Sheet for Patients: SugarRoll.be  Fact Sheet for Healthcare Providers: https://www.woods-mathews.com/  This test is not yet approved or cleared by the [place] FDA and  has been authorized for detection and/or diagnosis of SARS-CoV-2 by FDA under an Emergency Use Authorization (EUA). This EUA will remain  in effect (meaning this test can be used) for the duration of the COVID-19 declaration under Section 564(b)(1) of the Act, 21 U. S.C. section 360bbb-3(b)(1), unless the authorization is terminated or revoked sooner.   Performed at [facility], [place] [address] [place], [place] [address]  Radiology Studies: DG ABD ACUTE 2+V W 1V CHEST  Result Date: 06/14/2020 CLINICAL DATA:  Abdominal pain and emesis EXAM: DG ABDOMEN ACUTE WITH 1 VIEW CHEST COMPARISON:  Chest radiograph June 11, 2020; CT abdomen and pelvis June 10, 2020 FINDINGS: AP chest: There is no edema or airspace opacity. Heart is borderline enlarged with pulmonary vascularity normal. No adenopathy. Supine and upright abdomen: There is moderate stool in the colon. There is no bowel dilatation or air-fluid level to suggest bowel obstruction. No free air. There are surgical clips in the right upper abdomen. IMPRESSION: No bowel obstruction or free air evident. Moderate stool in colon. No edema or airspace opacity. Borderline cardiac enlargement. Electronically Signed   By: Lowella Grip III M.D.   On: 06/14/2020 10:48        Scheduled Meds: . diltiazem  120 mg Oral Daily  . feeding supplement  237 mL Oral BID BM  . polyethylene glycol  17 g Oral BID  . saccharomyces  boulardii  250 mg Oral BID   Continuous Infusions: . ertapenem 1,000 mg (06/14/20 1223)  . heparin 2,250 Units/hr (06/14/20 1004)     LOS: 8 days      Phillips Climes, MD [facility]  If 7PM-7AM, please contact night-coverage  06/14/2020, 4:21 PM

## 2020-06-14 NOTE — Progress Notes (Signed)
[facility] Progress Note     Subjective: CC-  Developed nausea overnight, notes poor appetite.  Feels a bit bloated, had a smear of bowel movement yesterday. WBC WNL, afebrile.  Objective: Vital signs in last 24 hours: Temp:  [98.2 F (36.8 C)-98.5 F (36.9 C)] 98.3 F (36.8 C) (01/28 0431) Pulse Rate:  [98-100] 99 (01/28 0431) Resp:  [16-20] 16 (01/28 0431) BP: (131-137)/(80-88) 137/87 (01/28 0431) SpO2:  [92 %-98 %] 92 % (01/28 0431) Last BM Date: 06/11/20  Intake/Output from previous day: 01/27 0701 - 01/28 0700 In: 960 [P.O.:960] Out: -  Intake/Output this shift: No intake/output data recorded.  PE: Gen: Alert, NAD, tearful Pulm:Rate and effort normalat rest CV: Tachycardic this morning in the 120s Abd: Soft,minimally distended,minimaltenderness of the LLQ without peritonitis Ext: No LE edema Psych: A&Ox3  Skin: no rashes noted, warm and dry   Lab Results:  Recent Labs    06/12/20 0244 06/13/20 0500  WBC 7.1 6.8  HGB 9.8* 10.3*  HCT 32.4* 32.1*  PLT 254 280   BMET Recent Labs    06/12/20 0244 06/12/20 1944  NA 137 137  K 3.9 4.2  CL 104 102  CO2 25 23  GLUCOSE 92 99  BUN 7 8  CREATININE 0.81 0.87  CALCIUM 9.2 9.0   PT/INR No results for input(s): LABPROT, INR in the last 72 hours. CMP     Component Value Date/Time   NA 137 06/12/2020 1944   NA 141 07/19/2019 0840   K 4.2 06/12/2020 1944   CL 102 06/12/2020 1944   CO2 23 06/12/2020 1944   GLUCOSE 99 06/12/2020 1944   BUN 8 06/12/2020 1944   BUN 13 07/19/2019 0840   CREATININE 0.87 06/12/2020 1944   CREATININE 0.88 09/09/2016 1146   CALCIUM 9.0 06/12/2020 1944   PROT 5.8 (L) 06/07/2020 0603   ALBUMIN 2.8 (L) 06/07/2020 0603   AST 23 06/07/2020 0603   ALT 20 06/07/2020 0603   ALKPHOS 94 06/07/2020 0603   BILITOT 1.0 06/07/2020 0603   GFRNONAA >60 06/12/2020 1944   GFRNONAA 74 09/09/2016 1146   GFRAA 105 07/19/2019 0840   GFRAA 86 09/09/2016 1146   Lipase      Component Value Date/Time   LIPASE 25 05/29/2020 0951       Studies/Results: No results found.  Anti-infectives: Anti-infectives (From admission, onward)   Start     Dose/Rate Route Frequency Ordered Stop   06/11/20 1230  ertapenem (INVANZ) 1,000 mg in sodium chloride 0.9 % 100 mL IVPB        1 g 200 mL/hr over 30 Minutes Intravenous Every 24 hours 06/11/20 1114     06/06/20 1850  piperacillin-tazobactam (ZOSYN) IVPB 3.375 g  Status:  Discontinued        3.375 g 12.5 mL/hr over 240 Minutes Intravenous Every 8 hours 06/06/20 1850 06/11/20 1114   06/06/20 1830  piperacillin-tazobactam (ZOSYN) IVPB 3.375 g        3.375 g 100 mL/hr over 30 Minutes Intravenous  Once 06/06/20 1818 06/06/20 2039       Assessment/Plan Paroxysmal A. Fib on Eliquis at home  OSA HTN COVID +  Acute sigmoid diverticulitis with small phlegmon/ abscess in distal sigmoid colon - Initially dx w/ diverticulitis on 1/12 and d/c with Augmentin. Failed outpatient therapy. CT 1/20 showed sigmoid diverticulitis w/ small phlegmon vs abscess just inferior to distal sigmoid colon measuring3.4 x 3.3 x 2.7 cm. Our team felt this was to small for  IR drainage. - repeat CT 1/24 shows overall no significant improvement in the inflammatory changes of the sigmoid colon; small amount of partially loculated fluid or phlegmon to the left of the sigmoid colonwith no discrete drainable fluid collection;2.2 x 1.8 cm ill-defined inflammatory area inferior to the sigmoid colon (76/3); no extraluminal air, no bowel obstruction -She is having more nausea although her abdominal pain has continued to improve.  Will order plain films to evaluate for ileus or obstruction.  We discussed if she was to worsen and require surgery during admission, this would likely result in a colostomy.With current covid diagnosis she is a poor surgical candidate due to increased risk of respiratory complications. - If patient improves with  conservative tx and aviods surgery, would recommend a colonoscopy in 4-6 weeks. She denies hx of colonoscopy in the past but has made arrangements to see GI at [facility] after discharge.  FEN -IVF, soft diet, Boost VTE -SCDs, heparin gtt (Eliquis on hold) ID -Zosyn1/20>>1/25, invanz 1/25>>  Plan:Acute abdominal series this morning   LOS: 8 days    Clovis Riley, MD [facility] 06/14/2020, 9:15 AM Please see Amion for pager number during day hours 7:00am-4:30pm

## 2020-06-15 ENCOUNTER — Inpatient Hospital Stay (HOSPITAL_COMMUNITY): Payer: No Typology Code available for payment source

## 2020-06-15 LAB — CBC WITH DIFFERENTIAL/PLATELET
Abs Immature Granulocytes: 0.04 10*3/uL (ref 0.00–0.07)
Basophils Absolute: 0 10*3/uL (ref 0.0–0.1)
Basophils Relative: 0 %
Eosinophils Absolute: 0 10*3/uL (ref 0.0–0.5)
Eosinophils Relative: 1 %
HCT: 36.2 % (ref 36.0–46.0)
Hemoglobin: 11 g/dL — ABNORMAL LOW (ref 12.0–15.0)
Immature Granulocytes: 1 %
Lymphocytes Relative: 13 %
Lymphs Abs: 0.9 10*3/uL (ref 0.7–4.0)
MCH: 26.3 pg (ref 26.0–34.0)
MCHC: 30.4 g/dL (ref 30.0–36.0)
MCV: 86.4 fL (ref 80.0–100.0)
Monocytes Absolute: 0.4 10*3/uL (ref 0.1–1.0)
Monocytes Relative: 5 %
Neutro Abs: 5.2 10*3/uL (ref 1.7–7.7)
Neutrophils Relative %: 80 %
Platelets: 285 10*3/uL (ref 150–400)
RBC: 4.19 MIL/uL (ref 3.87–5.11)
RDW: 16.3 % — ABNORMAL HIGH (ref 11.5–15.5)
WBC: 6.5 10*3/uL (ref 4.0–10.5)
nRBC: 0 % (ref 0.0–0.2)

## 2020-06-15 LAB — COMPREHENSIVE METABOLIC PANEL
ALT: 24 U/L (ref 0–44)
AST: 17 U/L (ref 15–41)
Albumin: 3 g/dL — ABNORMAL LOW (ref 3.5–5.0)
Alkaline Phosphatase: 110 U/L (ref 38–126)
Anion gap: 9 (ref 5–15)
BUN: 5 mg/dL — ABNORMAL LOW (ref 6–20)
CO2: 27 mmol/L (ref 22–32)
Calcium: 9.4 mg/dL (ref 8.9–10.3)
Chloride: 100 mmol/L (ref 98–111)
Creatinine, Ser: 0.9 mg/dL (ref 0.44–1.00)
GFR, Estimated: >60 mL/min (ref >60)
Glucose, Bld: 114 mg/dL — ABNORMAL HIGH (ref 70–99)
Potassium: 4 mmol/L (ref 3.5–5.1)
Sodium: 136 mmol/L (ref 135–145)
Total Bilirubin: 0.6 mg/dL (ref 0.3–1.2)
Total Protein: 6.3 g/dL — ABNORMAL LOW (ref 6.5–8.1)

## 2020-06-15 LAB — HEPARIN LEVEL (UNFRACTIONATED)
Heparin Unfractionated: 0.25 IU/mL — ABNORMAL LOW (ref 0.30–0.70)
Heparin Unfractionated: 0.51 IU/mL (ref 0.30–0.70)

## 2020-06-15 LAB — GLUCOSE, CAPILLARY
Glucose-Capillary: 108 mg/dL — ABNORMAL HIGH (ref 70–99)
Glucose-Capillary: 112 mg/dL — ABNORMAL HIGH (ref 70–99)
Glucose-Capillary: 121 mg/dL — ABNORMAL HIGH (ref 70–99)

## 2020-06-15 MED ORDER — IOHEXOL 300 MG/ML  SOLN
100.0000 mL | Freq: Once | INTRAMUSCULAR | Status: AC | PRN
  Administered 2020-06-15: 100 mL via INTRAVENOUS

## 2020-06-15 MED ORDER — IOHEXOL 9 MG/ML PO SOLN
500.0000 mL | ORAL | Status: AC
  Administered 2020-06-15 (×2): 500 mL via ORAL

## 2020-06-15 MED ORDER — DEXTROSE-NACL 5-0.45 % IV SOLN: INTRAVENOUS | Status: AC

## 2020-06-15 MED ORDER — DIGOXIN 0.25 MG/ML IJ SOLN
0.2500 mg | Freq: Once | INTRAMUSCULAR | Status: AC
  Administered 2020-06-15: 0.25 mg via INTRAVENOUS
  Filled 2020-06-15: qty 2

## 2020-06-15 NOTE — Progress Notes (Signed)
Progress Note: General Surgery Service   Chief Complaint/Subjective: Persistent vomiting overnight, epigastric abdominal pain, concerned with abdominal swelling  Objective: Vital signs in last 24 hours: Temp:  [97.5 F (36.4 C)-98.7 F (37.1 C)] 97.6 F (36.4 C) (01/29 0855) Pulse Rate:  [90-150] 111 (01/29 0855) Resp:  [14-20] 16 (01/29 0855) BP: (108-138)/(86-105) 113/92 (01/29 0855) SpO2:  [90 %-100 %] 96 % (01/29 0855) Weight:  [110.3 kg-110.5 kg] 110.5 kg (01/29 0430) Last BM Date: 06/15/20  Intake/Output from previous day: 01/28 0701 - 01/29 0700 In: -  Out: 600 [Emesis/NG output:600] Intake/Output this shift: No intake/output data recorded.  Gen: NAD  Resp: nonlabored  Card: tachycardic  Abd: tender to palpation in both upper abdomens  Lab Results: CBC  Recent Labs    06/14/20 1109 06/15/20 0515  WBC 5.8 6.5  HGB 10.4* 11.0*  HCT 34.2* 36.2  PLT 266 285   BMET Recent Labs    06/14/20 1109 06/15/20 0515  NA 138 136  K 3.9 4.0  CL 102 100  CO2 26 27  GLUCOSE 100* 114*  BUN <5* 5*  CREATININE 0.84 0.90  CALCIUM 9.3 9.4   PT/INR No results for input(s): LABPROT, INR in the last 72 hours. ABG No results for input(s): PHART, HCO3 in the last 72 hours.  Invalid input(s): PCO2, PO2  Anti-infectives: Anti-infectives (From admission, onward)   Start     Dose/Rate Route Frequency Ordered Stop   06/14/20 0000  ciprofloxacin (CIPRO) 500 MG tablet        500 mg Oral 2 times daily 06/14/20 1100 06/24/20 2359   06/14/20 0000  metroNIDAZOLE (FLAGYL) 500 MG tablet        500 mg Oral 3 times daily 06/14/20 1100 06/24/20 2359   06/11/20 1230  ertapenem (INVANZ) 1,000 mg in sodium chloride 0.9 % 100 mL IVPB        1 g 200 mL/hr over 30 Minutes Intravenous Every 24 hours 06/11/20 1114     06/06/20 1850  piperacillin-tazobactam (ZOSYN) IVPB 3.375 g  Status:  Discontinued        3.375 g 12.5 mL/hr over 240 Minutes Intravenous Every 8 hours 06/06/20 1850  06/11/20 1114   06/06/20 1830  piperacillin-tazobactam (ZOSYN) IVPB 3.375 g        3.375 g 100 mL/hr over 30 Minutes Intravenous  Once 06/06/20 1818 06/06/20 2039      Medications: Scheduled Meds: . diltiazem  120 mg Oral Daily  . feeding supplement  237 mL Oral BID BM  . polyethylene glycol  17 g Oral BID  . saccharomyces boulardii  250 mg Oral BID   Continuous Infusions: . dextrose 5 % and 0.45% NaCl    . ertapenem 1,000 mg (06/14/20 1223)  . heparin 2,400 Units/hr (06/15/20 0936)   PRN Meds:.acetaminophen, bisacodyl, dextromethorphan-guaiFENesin, HYDROmorphone (DILAUDID) injection, Ipratropium-Albuterol, metoprolol tartrate, ondansetron (ZOFRAN) IV, oxyCODONE  Assessment/Plan: 60 yo female with diverticulitis that had responded to antibiotics. In the last 24 h her symptoms have changed to epigastric pain from lower abdominal pain and she has worsening vomiting -CT scan -continue abx -NPO -IV fluids   LOS: 9 days   Mickeal Skinner, MD [facility]

## 2020-06-15 NOTE — Plan of Care (Incomplete)
  Problem: Education: Goal: Knowledge of risk factors and measures for prevention of condition will improve 06/15/2020 0523 by Tollie Eth, RN Outcome: Progressing 06/15/2020 0523 by Tollie Eth, RN Outcome: Progressing   Problem: Coping: Goal: Psychosocial and spiritual needs will be supported 06/15/2020 0523 by Tollie Eth, RN Outcome: Progressing 06/15/2020 0523 by Tollie Eth, RN Outcome: Progressing   Problem: Respiratory: Goal: Will maintain a patent airway 06/15/2020 0523 by Tollie Eth, RN Outcome: Progressing 06/15/2020 0523 by Tollie Eth, RN Outcome: Progressing Goal: Complications related to the disease process, condition or treatment will be avoided or minimized 06/15/2020 0523 by Tollie Eth, RN Outcome: Progressing 06/15/2020 0523 by Tollie Eth, RN Outcome: Progressing   Problem: Education: Goal: Knowledge of General Education information will improve Description: Including pain rating scale, medication(s)/side effects and non-pharmacologic comfort measures 06/15/2020 0523 by Tollie Eth, RN Outcome: Progressing 06/15/2020 0523 by Tollie Eth, RN Outcome: Progressing   Problem: Health Behavior/Discharge Planning: Goal: Ability to manage health-related needs will improve 06/15/2020 0523 by Tollie Eth, RN Outcome: Progressing 06/15/2020 0523 by Tollie Eth, RN Outcome: Progressing   Problem: Clinical Measurements: Goal: Ability to maintain clinical measurements within normal limits will improve 06/15/2020 0523 by Tollie Eth, RN Outcome: Progressing 06/15/2020 0523 by Tollie Eth, RN Outcome: Progressing Goal: Will remain free from infection 06/15/2020 0523 by Tollie Eth, RN Outcome: Progressing 06/15/2020 0523 by Tollie Eth, RN Outcome: Progressing Goal: Diagnostic test results will improve 06/15/2020 0523 by Tollie Eth, RN Outcome: Progressing 06/15/2020 0523 by Tollie Eth, RN Outcome: Progressing   Problem: Activity: Goal: Risk for activity intolerance will decrease 06/15/2020 0523 by Tollie Eth, RN Outcome: Progressing 06/15/2020 0523 by Tollie Eth, RN Outcome: Progressing   Problem: Coping: Goal: Level of anxiety will decrease 06/15/2020 0523 by Tollie Eth, RN Outcome: Progressing 06/15/2020 0523 by Tollie Eth, RN Outcome: Progressing   Problem: Elimination: Goal: Will not experience complications related to bowel motility 06/15/2020 0523 by Tollie Eth, RN Outcome: Progressing 06/15/2020 0523 by Tollie Eth, RN Outcome: Progressing   Problem: Safety: Goal: Ability to remain free from injury will improve 06/15/2020 0523 by Tollie Eth, RN Outcome: Progressing 06/15/2020 0523 by Tollie Eth, RN Outcome: Progressing   Problem: Skin Integrity: Goal: Risk for impaired skin integrity will decrease 06/15/2020 0523 by Tollie Eth, RN Outcome: Progressing 06/15/2020 0523 by Tollie Eth, RN Outcome: Progressing

## 2020-06-15 NOTE — Progress Notes (Signed)
PROGRESS NOTE    Tammy Davies  BEE:[id number] DOB: [birth year] DOA: 06/05/2020 PCP: Patient, No Pcp Per   Brief Narrative:   60 year old with history of paroxysmal A. fib on anticoagulation, tobacco use, obstructive sleep apnea on CPAP, HTN admitted for abdominal pain which has been ongoing for the past month which previously was treated as constipation and then uncomplicated diverticulitis about a week ago with Augmentin.  She presents with worsening of her symptoms.  CT abdomen pelvis showed sigmoid diverticulitis with small abscess.  General surgery consulted and started on Zosyn. Repeat CT showed persistent diverticulitis with absces     Subjective:  Poor appetite, reports nausea and vomiting, still complaining of abdominal pain   Assessment & Plan:   Active Problems:   Acute diverticulitis  Acute complicated diverticulitis with small abscess -Conservative management, pain control -Advance diet per general surgery -Repeat CT A/P-shows persistent diverticulitis.  So she has been switched from IV Zosyn to [employer]. -Morning patient reports nausea, vomiting and worsening abdominal pain, she remains afebrile with no leukocytosis, discussed with general surgery, recommendation to repeat CT abdomen pelvis, keep n.p.o. and start on IV fluids. - No role for general surgery or IR drain at the moment but this may change -Outpatient colonoscopy in 6 weeks -Patient with worsening abdominal pain today, but stable white blood cell count, afebrile, GI series with no evidence of obstruction, but significant stool burden, will increase her laxatives.  COVID-19 infection, mainly asymptomatic - Asymptomatic.  Supportive care.  Chest x-ray 1/25 -negative.     history of paroxysmal atrial fibrillation - On Cardizem.  Eliquis on hold.  Continue heparin drip until cleared by General surgery - Monitor hemoglobin closely. -At rate uncontrolled, will give 1 dose of digoxin for better control, blood  pressure is soft, will start on IV fluids and continue current dose Cardizem .  Essential hypertension -continue home Cardizem   DVT prophylaxis: Heparin drip Code Status: Full code Family Communication: Patient is awake, alert and appropriate, I have discussed her case in details with her, and answered all her questions.  Status is: Inpatient  Remains inpatient appropriate because:Inpatient level of care appropriate due to severity of illness   Dispo: The patient is from: Home              Anticipated d/c is to: Home              Anticipated d/c date is: 1 day              Patient currently is not medically stable to d/c.    Body mass index is 37.04 kg/m.   Examination:  Awake Alert, Oriented X 3, No new F.N deficits, Normal affect Symmetrical Chest wall movement, Good air movement bilaterally, CTAB Regular irregular,No Gallops,Rubs or new Murmurs, No Parasternal Heave +ve B.Sounds, Abd Soft, he has tenderness in upper abdomen  no Cyanosis, Clubbing or edema, No new Rash or bruise       Objective: Vitals:   06/15/20 0315 06/15/20 0430 06/15/20 0855 06/15/20 1140  BP: 138/88 108/86 (!) 113/92 106/84  Pulse: (!) 125 (!) 113 (!) 111 99  Resp: 20 16 16 17   Temp: (!) 97.5 F (36.4 C) 97.7 F (36.5 C) 97.6 F (36.4 C) 97.8 F (36.6 C)  TempSrc: Oral Oral Oral Oral  SpO2: 96% 95% 96% 97%  Weight:  110.5 kg    Height:        Intake/Output Summary (Last 24 hours) at 06/15/2020 1439 Last data  filed at 06/15/2020 0300 Gross per 24 hour  Intake -  Output 600 ml  Net -600 ml   Filed Weights   06/05/20 2219 06/14/20 1100 06/15/20 0430  Weight: 111.1 kg 110.3 kg 110.5 kg     Data Reviewed:   CBC: Recent Labs  Lab 06/11/20 0201 06/12/20 0244 06/13/20 0500 06/14/20 1109 06/15/20 0515  WBC 5.7 7.1 6.8 5.8 6.5  NEUTROABS  --   --   --   --  5.2  HGB 9.9* 9.8* 10.3* 10.4* 11.0*  HCT 32.9* 32.4* 32.1* 34.2* 36.2  MCV 86.8 86.9 84.7 86.8 86.4  PLT 253 254 280  266 856   Basic Metabolic Panel: Recent Labs  Lab 06/09/20 0223 06/10/20 0026 06/11/20 0201 06/12/20 0244 06/12/20 1944 06/14/20 1109 06/15/20 0515  NA 136 136 138 137 137 138 136  K 3.5 3.6 3.9 3.9 4.2 3.9 4.0  CL 104 105 104 104 102 102 100  CO2 24 23 23 25 23 26 27   GLUCOSE 94 110* 102* 92 99 100* 114*  BUN 9 7 <5* 7 8 <5* 5*  CREATININE 0.77 0.78 0.80 0.81 0.87 0.84 0.90  CALCIUM 8.5* 8.6* 9.2 9.2 9.0 9.3 9.4  MG 1.9 1.9 2.0 1.9  --   --   --    GFR: Estimated Creatinine Clearance: 87.7 mL/min (by C-G formula based on SCr of 0.9 mg/dL). Liver Function Tests: Recent Labs  Lab 06/15/20 0515  AST 17  ALT 24  ALKPHOS 110  BILITOT 0.6  PROT 6.3*  ALBUMIN 3.0*   No results for input(s): LIPASE, AMYLASE in the last 168 hours. No results for input(s): AMMONIA in the last 168 hours. Coagulation Profile: No results for input(s): INR, PROTIME in the last 168 hours. Cardiac Enzymes: No results for input(s): CKTOTAL, CKMB, CKMBINDEX, TROPONINI in the last 168 hours. BNP (last 3 results) No results for input(s): PROBNP in the last 8760 hours. HbA1C: No results for input(s): HGBA1C in the last 72 hours. CBG: Recent Labs  Lab 06/14/20 1201 06/14/20 1653 06/14/20 2102 06/15/20 0728 06/15/20 1138  GLUCAP 97 107* 134* 108* 112*   Lipid Profile: No results for input(s): CHOL, HDL, LDLCALC, TRIG, CHOLHDL, LDLDIRECT in the last 72 hours. Thyroid Function Tests: No results for input(s): TSH, T4TOTAL, FREET4, T3FREE, THYROIDAB in the last 72 hours. Anemia Panel: No results for input(s): VITAMINB12, FOLATE, FERRITIN, TIBC, IRON, RETICCTPCT in the last 72 hours. Sepsis Labs: Recent Labs  Lab 06/11/20 0956  PROCALCITON <0.10    Recent Results (from the past 240 hour(s))  SARS CORONAVIRUS 2 (TAT 6-24 HRS) Nasopharyngeal Nasopharyngeal Swab     Status: Abnormal   Collection Time: 06/06/20  3:08 PM   Specimen: Nasopharyngeal Swab  Result Value Ref Range Status   SARS  Coronavirus 2 POSITIVE (A) NEGATIVE Final    Comment: (NOTE) SARS-CoV-2 target nucleic acids are DETECTED.  The SARS-CoV-2 RNA is generally detectable in upper and lower respiratory specimens during the acute phase of infection. Positive results are indicative of the presence of SARS-CoV-2 RNA. Clinical correlation with patient history and other diagnostic information is  necessary to determine patient infection status. Positive results do not rule out bacterial infection or co-infection with other viruses.  The expected result is Negative.  Fact Sheet for Patients: SugarRoll.be  Fact Sheet for Healthcare Providers: https://www.woods-mathews.com/  This test is not yet approved or cleared by the [place] FDA and  has been authorized for detection and/or diagnosis of SARS-CoV-2 by FDA  under an Emergency Use Authorization (EUA). This EUA will remain  in effect (meaning this test can be used) for the duration of the COVID-19 declaration under Section 564(b)(1) of the Act, 21 U. S.C. section 360bbb-3(b)(1), unless the authorization is terminated or revoked sooner.   Performed at [facility], [place] [address], [place], [place] [address]          Radiology Studies: DG ABD ACUTE 2+V W 1V CHEST  Result Date: 06/15/2020 CLINICAL DATA:  Perforated abdominal viscus EXAM: DG ABDOMEN ACUTE WITH 1 VIEW CHEST COMPARISON:  Chest x-ray 06/11/2020, CT abdomen pelvis 06/10/2020 FINDINGS: There is no evidence of dilated bowel loops or free intraperitoneal air. No radiopaque calculi or other significant radiographic abnormality is seen. Surgical clips overlie the right upper quadrant. The heart size and mediastinal contours are unchanged. Bibasilar compressive changes. No definite focal consolidation. No pulmonary edema. No pleural effusion. No pneumothorax. No acute osseous abnormality. IMPRESSION: Negative abdominal radiographs.  No acute  cardiopulmonary disease. Electronically Signed   By: Iven Finn M.D.   On: 06/15/2020 04:06   DG ABD ACUTE 2+V W 1V CHEST  Result Date: 06/14/2020 CLINICAL DATA:  Abdominal pain and emesis EXAM: DG ABDOMEN ACUTE WITH 1 VIEW CHEST COMPARISON:  Chest radiograph June 11, 2020; CT abdomen and pelvis June 10, 2020 FINDINGS: AP chest: There is no edema or airspace opacity. Heart is borderline enlarged with pulmonary vascularity normal. No adenopathy. Supine and upright abdomen: There is moderate stool in the colon. There is no bowel dilatation or air-fluid level to suggest bowel obstruction. No free air. There are surgical clips in the right upper abdomen. IMPRESSION: No bowel obstruction or free air evident. Moderate stool in colon. No edema or airspace opacity. Borderline cardiac enlargement. Electronically Signed   By: Lowella Grip III M.D.   On: 06/14/2020 10:48        Scheduled Meds: . digoxin  0.25 mg Intravenous Once  . diltiazem  120 mg Oral Daily  . feeding supplement  237 mL Oral BID BM  . polyethylene glycol  17 g Oral BID  . saccharomyces boulardii  250 mg Oral BID   Continuous Infusions: . dextrose 5 % and 0.45% NaCl 50 mL/hr at 06/15/20 1221  . ertapenem 1,000 mg (06/15/20 1226)  . heparin 2,400 Units/hr (06/15/20 1115)     LOS: 9 days      Phillips Climes, MD [facility]  If 7PM-7AM, please contact night-coverage  06/15/2020, 2:39 PM

## 2020-06-15 NOTE — Progress Notes (Signed)
ANTICOAGULATION CONSULT NOTE - Follow Up Consult  Pharmacy Consult for IV Heparin Indication: atrial fibrillation  No Known Allergies  Patient Measurements: Height: 5' 8"  (172.7 cm) Weight: 110.5 kg (243 lb 9.7 oz) IBW/kg (Calculated) : 63.9 Heparin Dosing Weight: 89 kg  Vital Signs: Temp: 97.7 F (36.5 C) (01/29 0430) Temp Source: Oral (01/29 0430) BP: 108/86 (01/29 0430) Pulse Rate: 113 (01/29 0430)  Labs: Recent Labs    06/12/20 1944 06/13/20 0500 06/13/20 0500 06/14/20 0258 06/14/20 1109 06/15/20 0515  HGB  --  10.3*   < >  --  10.4* 11.0*  HCT  --  32.1*  --   --  34.2* 36.2  PLT  --  280  --   --  266 285  HEPARINUNFRC  --  0.46  --  0.38  --  0.25*  CREATININE 0.87  --   --   --  0.84 0.90   < > = values in this interval not displayed.    Estimated Creatinine Clearance: 87.7 mL/min (by C-G formula based on SCr of 0.9 mg/dL).   Medical History: Past Medical History:  Diagnosis Date  . Dysrhythmia   . PAF (paroxysmal atrial fibrillation) ([place])   . Tobacco abuse     Assessment: Pt was recently discharged from [facility] for diverticulitis with possible abscess. She is on chronic apixaban for afib (last dose on 1/19). She presented with worsening abd pain. Plan is to hold apixaban and bridge with IV heparin for possible intervention if needed. Pharmacy was consulted to dose IV heparin.    HL this morning is subtherapeutic at 0.25. Hgb is stable 11 and PLTs 285.   Goal of Therapy:  Heparin level 0.3-0.7 units/ml Monitor platelets by anticoagulation protocol: Yes   Plan:  Increase heparin infusion to 2400 units/hr F/u 6 hr heparin level Monitor for s/sx bleeding F/u change to apixaban on discharge per primary  Wilson Singer, PharmD PGY1 Pharmacy Resident 06/15/2020 7:38 AM

## 2020-06-15 NOTE — Progress Notes (Addendum)
ANTICOAGULATION CONSULT NOTE - Follow Up Consult  Pharmacy Consult for IV Heparin Indication: atrial fibrillation  No Known Allergies  Patient Measurements: Height: 5' 8"  (172.7 cm) Weight: 110.5 kg (243 lb 9.7 oz) IBW/kg (Calculated) : 63.9 Heparin Dosing Weight: 89 kg  Vital Signs: Temp: 97.8 F (36.6 C) (01/29 1140) Temp Source: Oral (01/29 1140) BP: 106/84 (01/29 1140) Pulse Rate: 99 (01/29 1140)  Labs: Recent Labs    06/12/20 1944 06/13/20 0500 06/13/20 0500 06/14/20 0258 06/14/20 1109 06/15/20 0515 06/15/20 1526  HGB  --  10.3*   < >  --  10.4* 11.0*  --   HCT  --  32.1*  --   --  34.2* 36.2  --   PLT  --  280  --   --  266 285  --   HEPARINUNFRC  --  0.46   < > 0.38  --  0.25* 0.51  CREATININE 0.87  --   --   --  0.84 0.90  --    < > = values in this interval not displayed.    Estimated Creatinine Clearance: 87.7 mL/min (by C-G formula based on SCr of 0.9 mg/dL).   Medical History: Past Medical History:  Diagnosis Date  . Dysrhythmia   . PAF (paroxysmal atrial fibrillation) ([place])   . Tobacco abuse     Assessment: Pt was recently discharged from [facility] for diverticulitis with possible abscess. She is on chronic apixaban for afib (last dose on 1/19). Plan is to hold apixaban and bridge with IV heparin for possible intervention if needed. Pharmacy was consulted to dose IV heparin.    HL therapeutic at 0.51 after rate increase to 2400 units/hr. Interestingly, was at goal on 2250 units/hr for 4 days before HL dropped. Will decrease rate slightly to prevent supratherapeutic level. H/H, plt stable.  Goal of Therapy:  Heparin level 0.3-0.7 units/ml Monitor platelets by anticoagulation protocol: Yes   Plan:  Decrease heparin infusion to 2350 units/hr Monitor daily HL, CBC/plt Monitor for signs/symptoms of bleeding  F/u switch back to apixaban when no surgery planned   Benetta Spar, PharmD, BCPS, [place] Pharmacist  Please check AMION  for all [place] phone numbers After 10:00 PM, call [place]

## 2020-06-16 ENCOUNTER — Inpatient Hospital Stay: Payer: Self-pay

## 2020-06-16 ENCOUNTER — Inpatient Hospital Stay (HOSPITAL_COMMUNITY): Payer: No Typology Code available for payment source

## 2020-06-16 DIAGNOSIS — I4891 Unspecified atrial fibrillation: Secondary | ICD-10-CM

## 2020-06-16 LAB — GLUCOSE, CAPILLARY
Glucose-Capillary: 115 mg/dL — ABNORMAL HIGH (ref 70–99)
Glucose-Capillary: 87 mg/dL (ref 70–99)
Glucose-Capillary: 97 mg/dL (ref 70–99)
Glucose-Capillary: 98 mg/dL (ref 70–99)
Glucose-Capillary: 99 mg/dL (ref 70–99)

## 2020-06-16 LAB — COMPREHENSIVE METABOLIC PANEL
ALT: 22 U/L (ref 0–44)
AST: 20 U/L (ref 15–41)
Albumin: 2.8 g/dL — ABNORMAL LOW (ref 3.5–5.0)
Alkaline Phosphatase: 95 U/L (ref 38–126)
Anion gap: 11 (ref 5–15)
BUN: 6 mg/dL (ref 6–20)
CO2: 24 mmol/L (ref 22–32)
Calcium: 8.7 mg/dL — ABNORMAL LOW (ref 8.9–10.3)
Chloride: 101 mmol/L (ref 98–111)
Creatinine, Ser: 0.85 mg/dL (ref 0.44–1.00)
GFR, Estimated: >60 mL/min (ref >60)
Glucose, Bld: 107 mg/dL — ABNORMAL HIGH (ref 70–99)
Potassium: 4 mmol/L (ref 3.5–5.1)
Sodium: 136 mmol/L (ref 135–145)
Total Bilirubin: 0.6 mg/dL (ref 0.3–1.2)
Total Protein: 5.7 g/dL — ABNORMAL LOW (ref 6.5–8.1)

## 2020-06-16 LAB — CBC
HCT: 32.6 % — ABNORMAL LOW (ref 36.0–46.0)
Hemoglobin: 10.2 g/dL — ABNORMAL LOW (ref 12.0–15.0)
MCH: 26.9 pg (ref 26.0–34.0)
MCHC: 31.3 g/dL (ref 30.0–36.0)
MCV: 86 fL (ref 80.0–100.0)
Platelets: 206 10*3/uL (ref 150–400)
RBC: 3.79 MIL/uL — ABNORMAL LOW (ref 3.87–5.11)
RDW: 16.3 % — ABNORMAL HIGH (ref 11.5–15.5)
WBC: 5.5 10*3/uL (ref 4.0–10.5)
nRBC: 0 % (ref 0.0–0.2)

## 2020-06-16 LAB — HEPARIN LEVEL (UNFRACTIONATED): Heparin Unfractionated: 0.45 IU/mL (ref 0.30–0.70)

## 2020-06-16 LAB — LIPASE, BLOOD: Lipase: 19 U/L (ref 11–51)

## 2020-06-16 MED ORDER — TRACE MINERALS CU-MN-SE-ZN 300-55-60-3000 MCG/ML IV SOLN
INTRAVENOUS | Status: AC
  Filled 2020-06-16: qty 342.4

## 2020-06-16 MED ORDER — CHLORHEXIDINE GLUCONATE CLOTH 2 % EX PADS
6.0000 | MEDICATED_PAD | Freq: Every day | CUTANEOUS | Status: DC
  Administered 2020-06-16 – 2020-06-27 (×11): 6 via TOPICAL

## 2020-06-16 MED ORDER — SODIUM CHLORIDE 0.9% FLUSH
10.0000 mL | INTRAVENOUS | Status: DC | PRN
  Administered 2020-06-21: 10 mL

## 2020-06-16 MED ORDER — DIGOXIN 0.25 MG/ML IJ SOLN
0.2500 mg | Freq: Once | INTRAMUSCULAR | Status: AC
  Administered 2020-06-16: 0.25 mg via INTRAVENOUS
  Filled 2020-06-16: qty 2

## 2020-06-16 MED ORDER — INSULIN ASPART 100 UNIT/ML ~~LOC~~ SOLN: 0.0000 [IU] | SUBCUTANEOUS | Status: DC

## 2020-06-16 NOTE — Progress Notes (Signed)
PROGRESS NOTE    Tammy Davies  LNL:[id number] DOB: 17-Sep-1960 DOA: 06/05/2020 PCP: Patient, No Pcp Per   Brief Narrative:   60 year old with history of paroxysmal A. fib on anticoagulation, tobacco use, obstructive sleep apnea on CPAP, HTN admitted for abdominal pain which has been ongoing for the past month which previously was treated as constipation and then uncomplicated diverticulitis about a week ago with Augmentin.  She presents with worsening of her symptoms.  CT abdomen pelvis showed sigmoid diverticulitis with small abscess.,  Consulted and started on Zosyn. Repeat CT showed persistent diverticulitis with absces     Subjective:  Poor appetite, reports nausea and vomiting, still complaining of abdominal pain   Assessment & Plan:   Active Problems:   Acute diverticulitis  Acute complicated diverticulitis with small abscess -Conservative management, pain control -Patient was initially on IV Zosyn with no improvement, so it was advanced to IV Invanz, she has been complaining of nausea, vomiting and worsening abdominal pain for 8 hours, the mid pelvis has been repeated, and it is showing worsening diverticulitis, with concerning of obstruction and forming stricture. -As per general surgery, plan to keep n.p.o., on IV fluids, PICC line and TPN, likely will need surgery this coming week for resection with colostomy.  COVID-19 infection, mainly asymptomatic - Asymptomatic.  Supportive care.  Chest x-ray 1/25 -negative.   -Last day of isolation will be tomorrow 06/17/2020   history of paroxysmal atrial fibrillation - On Cardizem.  Eliquis on hold.  Continue heparin drip until cleared by General surgery -Heart rate slightly uncontrolled, this is most likely due to nausea vomiting, Aleem depletion, so started on IV fluids. -Continue with Cardizem. -digoxin  as needed.  Essential hypertension -continue home Cardizem   DVT prophylaxis: Heparin drip Code Status: Full  code Family Communication: Patient is awake, alert and appropriate, I have discussed her case in details with her, and answered all her questions.  Status is: Inpatient  Remains inpatient appropriate because:Inpatient level of care appropriate due to severity of illness   Dispo: The patient is from: Home              Anticipated d/c is to: Home              Anticipated d/c date is: 1 day              Patient currently is not medically stable to d/c.    Body mass index is 36.2 kg/m.   Examination:  Awake Alert, Oriented X 3, No new F.N deficits, Normal affect Symmetrical Chest wall movement, Good air movement bilaterally, CTAB Irregular irregular,No Gallops,Rubs or new Murmurs, No Parasternal Heave +ve B.Sounds, Abd Soft, upper abdominal tenderness to palpation, No rebound - guarding or rigidity. No Cyanosis, Clubbing or edema, No new Rash or bruise        Objective: Vitals:   06/16/20 0404 06/16/20 1025 06/16/20 1036 06/16/20 1058  BP: 109/79 132/80    Pulse: 84     Resp: 12   18  Temp: 97.9 F (36.6 C)     TempSrc: Oral     SpO2: 96% 96% 94%   Weight: 108 kg     Height:        Intake/Output Summary (Last 24 hours) at 06/16/2020 1413 Last data filed at 06/16/2020 0500 Gross per 24 hour  Intake 1423.31 ml  Output -  Net 1423.31 ml   Filed Weights   06/14/20 1100 06/15/20 0430 06/16/20 0404  Weight: 110.3 kg  110.5 kg 108 kg     Data Reviewed:   CBC: Recent Labs  Lab 06/12/20 0244 06/13/20 0500 06/14/20 1109 06/15/20 0515 06/16/20 0406  WBC 7.1 6.8 5.8 6.5 5.5  NEUTROABS  --   --   --  5.2  --   HGB 9.8* 10.3* 10.4* 11.0* 10.2*  HCT 32.4* 32.1* 34.2* 36.2 32.6*  MCV 86.9 84.7 86.8 86.4 86.0  PLT 254 280 266 285 793   Basic Metabolic Panel: Recent Labs  Lab 06/10/20 0026 06/11/20 0201 06/12/20 0244 06/12/20 1944 06/14/20 1109 06/15/20 0515 06/16/20 0406  NA 136 138 137 137 138 136 136  K 3.6 3.9 3.9 4.2 3.9 4.0 4.0  CL 105 104 104 102 102  100 101  CO2 23 23 25 23 26 27 24   GLUCOSE 110* 102* 92 99 100* 114* 107*  BUN 7 <5* 7 8 <5* 5* 6  CREATININE 0.78 0.80 0.81 0.87 0.84 0.90 0.85  CALCIUM 8.6* 9.2 9.2 9.0 9.3 9.4 8.7*  MG 1.9 2.0 1.9  --   --   --   --    GFR: Estimated Creatinine Clearance: 91.7 mL/min (by C-G formula based on SCr of 0.85 mg/dL). Liver Function Tests: Recent Labs  Lab 06/15/20 0515 06/16/20 0406  AST 17 20  ALT 24 22  ALKPHOS 110 95  BILITOT 0.6 0.6  PROT 6.3* 5.7*  ALBUMIN 3.0* 2.8*   Recent Labs  Lab 06/16/20 0406  LIPASE 19   No results for input(s): AMMONIA in the last 168 hours. Coagulation Profile: No results for input(s): INR, PROTIME in the last 168 hours. Cardiac Enzymes: No results for input(s): CKTOTAL, CKMB, CKMBINDEX, TROPONINI in the last 168 hours. BNP (last 3 results) No results for input(s): PROBNP in the last 8760 hours. HbA1C: No results for input(s): HGBA1C in the last 72 hours. CBG: Recent Labs  Lab 06/15/20 0728 06/15/20 1138 06/15/20 2112 06/16/20 0714 06/16/20 1206  GLUCAP 108* 112* 121* 97 98   Lipid Profile: No results for input(s): CHOL, HDL, LDLCALC, TRIG, CHOLHDL, LDLDIRECT in the last 72 hours. Thyroid Function Tests: No results for input(s): TSH, T4TOTAL, FREET4, T3FREE, THYROIDAB in the last 72 hours. Anemia Panel: No results for input(s): VITAMINB12, FOLATE, FERRITIN, TIBC, IRON, RETICCTPCT in the last 72 hours. Sepsis Labs: Recent Labs  Lab 06/11/20 0956  PROCALCITON <0.10    Recent Results (from the past 240 hour(s))  SARS CORONAVIRUS 2 (TAT 6-24 HRS) Nasopharyngeal Nasopharyngeal Swab     Status: Abnormal   Collection Time: 06/06/20  3:08 PM   Specimen: Nasopharyngeal Swab  Result Value Ref Range Status   SARS Coronavirus 2 POSITIVE (A) NEGATIVE Final    Comment: (NOTE) SARS-CoV-2 target nucleic acids are DETECTED.  The SARS-CoV-2 RNA is generally detectable in upper and lower respiratory specimens during the acute phase of  infection. Positive results are indicative of the presence of SARS-CoV-2 RNA. Clinical correlation with patient history and other diagnostic information is  necessary to determine patient infection status. Positive results do not rule out bacterial infection or co-infection with other viruses.  The expected result is Negative.  Fact Sheet for Patients: SugarRoll.be  Fact Sheet for Healthcare Providers: https://www.woods-mathews.com/  This test is not yet approved or cleared by the [place] FDA and  has been authorized for detection and/or diagnosis of SARS-CoV-2 by FDA under an Emergency Use Authorization (EUA). This EUA will remain  in effect (meaning this test can be used) for the duration of the COVID-19  declaration under Section 564(b)(1) of the Act, 21 U. S.C. section 360bbb-3(b)(1), unless the authorization is terminated or revoked sooner.   Performed at [facility], [place] [address] [place], [place] [address]          Radiology Studies: CT ABDOMEN PELVIS W CONTRAST  Result Date: 06/15/2020 CLINICAL DATA:  Abdominal distension.  COVID positive. EXAM: CT ABDOMEN AND PELVIS WITH CONTRAST TECHNIQUE: Multidetector CT imaging of the abdomen and pelvis was performed using the standard protocol following bolus administration of intravenous contrast. CONTRAST:  147m OMNIPAQUE IOHEXOL 300 MG/ML  SOLN COMPARISON:  June 10, 2020 FINDINGS: Lower chest: There are small bilateral pleural effusions with adjacent atelectasis.The heart size is normal. 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 There is a similar hypoattenuating area adjacent to the gallbladder fossa. The patient is status post prior cholecystectomy.There is no biliary ductal dilation. Pancreas: Normal contours without ductal dilatation. No peripancreatic fluid collection. Spleen: Unremarkable. Adrenals/Urinary Tract: --Adrenal glands:  Unremarkable. --Right kidney/ureter: No hydronephrosis or radiopaque kidney stones. --Left kidney/ureter: No hydronephrosis or radiopaque kidney stones. --Urinary bladder: Unremarkable. Stomach/Bowel: --Stomach/Duodenum: No hiatal hernia or other gastric abnormality. Normal duodenal course and caliber. --Small bowel: Unremarkable. --Colon: Again noted are extensive inflammatory changes of the sigmoid colon consistent with sigmoid diverticulitis. These changes have substantially worsened since the prior study. There are now appears to be some degree of obstruction at this level as evidence by mildly dilated upstream colon with air-fluid levels. There are scattered colonic diverticula throughout the remaining portions of the colon. There is no abscess. There is no free air. --Appendix: Normal. Vascular/Lymphatic: Atherosclerotic calcification is present within the non-aneurysmal abdominal aorta, without hemodynamically significant stenosis. --No retroperitoneal lymphadenopathy. --No mesenteric lymphadenopathy. --No pelvic or inguinal lymphadenopathy. Reproductive: Unremarkable Other: No ascites or free air. The abdominal wall is normal. Musculoskeletal. No acute displaced fractures. IMPRESSION: 1. Worsening sigmoid diverticulitis, still without evidence for perforation or abscess formation. There now appears to be some degree of obstruction as evidence by dilatation of the upstream colon now with associated air-fluid levels. 2. Growing small bilateral pleural effusions. 3. Additional chronic findings as detailed above. Aortic Atherosclerosis (ICD10-I70.0). Electronically Signed   By: CConstance HolsterM.D.   On: 06/15/2020 17:09   DG ABD ACUTE 2+V W 1V CHEST  Result Date: 06/15/2020 CLINICAL DATA:  Perforated abdominal viscus EXAM: DG ABDOMEN ACUTE WITH 1 VIEW CHEST COMPARISON:  Chest x-ray 06/11/2020, CT abdomen pelvis 06/10/2020 FINDINGS: There is no evidence of dilated bowel loops or free intraperitoneal air.  No radiopaque calculi or other significant radiographic abnormality is seen. Surgical clips overlie the right upper quadrant. The heart size and mediastinal contours are unchanged. Bibasilar compressive changes. No definite focal consolidation. No pulmonary edema. No pleural effusion. No pneumothorax. No acute osseous abnormality. IMPRESSION: Negative abdominal radiographs.  No acute cardiopulmonary disease. Electronically Signed   By: MIven FinnM.D.   On: 06/15/2020 04:06   [place] SITE RITE  Result Date: 06/16/2020 If Site Rite image not attached, placement could not be confirmed due to current cardiac rhythm.  [place] SITE RITE  Result Date: 06/16/2020 If Site Rite image not attached, placement could not be confirmed due to current cardiac rhythm.       Scheduled Meds: . diltiazem  120 mg Oral Daily  . insulin aspart  0-9 Units Subcutaneous Q4H  . polyethylene glycol  17 g Oral BID  . saccharomyces boulardii  250 mg Oral BID  Continuous Infusions: . dextrose 5 % and 0.45% NaCl 75 mL/hr at 06/16/20 1336  . ertapenem 1,000 mg (06/16/20 1337)  . heparin 2,350 Units/hr (06/16/20 1305)  . TPN ADULT (ION)       LOS: 10 days      Phillips Climes, MD [facility]  If 7PM-7AM, please contact night-coverage  06/16/2020, 2:13 PM

## 2020-06-16 NOTE — Progress Notes (Signed)
ANTICOAGULATION CONSULT NOTE - Follow Up Consult  Pharmacy Consult for IV Heparin Indication: atrial fibrillation  No Known Allergies  Patient Measurements: Height: 5' 8"  (172.7 cm) Weight: 108 kg (238 lb 1.6 oz) IBW/kg (Calculated) : 63.9 Heparin Dosing Weight: 89 kg  Vital Signs: Temp: 97.9 F (36.6 C) (01/30 0404) Temp Source: Oral (01/30 0404) BP: 109/79 (01/30 0404) Pulse Rate: 84 (01/30 0404)  Labs: Recent Labs    06/14/20 1109 06/15/20 0515 06/15/20 1526 06/16/20 0406  HGB 10.4* 11.0*  --  10.2*  HCT 34.2* 36.2  --  32.6*  PLT 266 285  --  206  HEPARINUNFRC  --  0.25* 0.51 0.45  CREATININE 0.84 0.90  --  0.85    Estimated Creatinine Clearance: 91.7 mL/min (by C-G formula based on SCr of 0.85 mg/dL).   Medical History: Past Medical History:  Diagnosis Date  . Dysrhythmia   . PAF (paroxysmal atrial fibrillation) ([place])   . Tobacco abuse     Assessment: Pt was [facility] for diverticulitis with possible abscess. Pt now has epigastric pain with worsening vomiting. She is on chronic apixaban for afib (last dose on 1/19). Plan is to hold apixaban and bridge with IV heparin for possible intervention if needed. Pharmacy was consulted to dose IV heparin until cleared from surgical team.    HL therapeutic at 0.45 this AM.   Hgb 10.2; Plt 206 (decreased from 1/29) Spoke with RN who reported no signs of bleeding  Goal of Therapy:  Heparin level 0.3-0.7 units/ml Monitor platelets by anticoagulation protocol: Yes   Plan:  Continue heparin infusion at 2350 units/hr Monitor daily HL, CBC/plt Monitor for signs/symptoms of bleeding  F/u switch back to apixaban when no surgery planned   Wilson Singer, PharmD PGY1 Pharmacy Resident 06/16/2020 7:50 AM

## 2020-06-16 NOTE — Progress Notes (Signed)
Progress Note: General Surgery Service   Chief Complaint/Subjective: Persistent abdominal pain, diarrhea overnight, no nausea today, thinks surgery would be a good idea  Objective: Vital signs in last 24 hours: Temp:  [97.8 F (36.6 C)-98.7 F (37.1 C)] 97.9 F (36.6 C) (01/30 0404) Pulse Rate:  [84-100] 84 (01/30 0404) Resp:  [12-17] 12 (01/30 0404) BP: (106-135)/(79-84) 109/79 (01/30 0404) SpO2:  [96 %-98 %] 96 % (01/30 0404) Weight:  [509 kg] 108 kg (01/30 0404) Last BM Date: 06/15/20  Intake/Output from previous day: 01/29 0701 - 01/30 0700 In: 1423.3 [I.V.:1423.3] Out: -  Intake/Output this shift: No intake/output data recorded.  Gen: NAd  Resp: nonlabored  Card: RRR  Abd: soft, TTP LUQ, nondistended  Lab Results: CBC  Recent Labs    06/15/20 0515 06/16/20 0406  WBC 6.5 5.5  HGB 11.0* 10.2*  HCT 36.2 32.6*  PLT 285 206   BMET Recent Labs    06/15/20 0515 06/16/20 0406  NA 136 136  K 4.0 4.0  CL 100 101  CO2 27 24  GLUCOSE 114* 107*  BUN 5* 6  CREATININE 0.90 0.85  CALCIUM 9.4 8.7*   PT/INR No results for input(s): LABPROT, INR in the last 72 hours. ABG No results for input(s): PHART, HCO3 in the last 72 hours.  Invalid input(s): PCO2, PO2  Anti-infectives: Anti-infectives (From admission, onward)   Start     Dose/Rate Route Frequency Ordered Stop   06/14/20 0000  ciprofloxacin (CIPRO) 500 MG tablet        500 mg Oral 2 times daily 06/14/20 1100 06/24/20 2359   06/14/20 0000  metroNIDAZOLE (FLAGYL) 500 MG tablet        500 mg Oral 3 times daily 06/14/20 1100 06/24/20 2359   06/11/20 1230  ertapenem (INVANZ) 1,000 mg in sodium chloride 0.9 % 100 mL IVPB        1 g 200 mL/hr over 30 Minutes Intravenous Every 24 hours 06/11/20 1114     06/06/20 1850  piperacillin-tazobactam (ZOSYN) IVPB 3.375 g  Status:  Discontinued        3.375 g 12.5 mL/hr over 240 Minutes Intravenous Every 8 hours 06/06/20 1850 06/11/20 1114   06/06/20 1830   piperacillin-tazobactam (ZOSYN) IVPB 3.375 g        3.375 g 100 mL/hr over 30 Minutes Intravenous  Once 06/06/20 1818 06/06/20 2039      Medications: Scheduled Meds: . digoxin  0.25 mg Intravenous Once  . diltiazem  120 mg Oral Daily  . polyethylene glycol  17 g Oral BID  . saccharomyces boulardii  250 mg Oral BID   Continuous Infusions: . dextrose 5 % and 0.45% NaCl 75 mL/hr at 06/16/20 0217  . ertapenem 1,000 mg (06/15/20 1226)  . heparin 2,350 Units/hr (06/15/20 2248)   PRN Meds:.acetaminophen, dextromethorphan-guaiFENesin, HYDROmorphone (DILAUDID) injection, Ipratropium-Albuterol, metoprolol tartrate, ondansetron (ZOFRAN) IV, oxyCODONE  Assessment/Plan 60 yo female with diverticulitis. CT scan 1/29 shows worse inflammation and concern for stricture. -continue NPO -IV fluids -PICC TPN -discussed likely will go to surgery this week for resection with colostomy.    LOS: 10 days   Mickeal Skinner, MD [facility]

## 2020-06-16 NOTE — Progress Notes (Signed)
Peripherally Inserted Central Catheter Placement  The IV Nurse has discussed with the patient and/or persons authorized to consent for the patient, the purpose of this procedure and the potential benefits and risks involved with this procedure.  The benefits include less needle sticks, lab draws from the catheter, and the patient may be discharged home with the catheter. Risks include, but not limited to, infection, bleeding, blood clot (thrombus formation), and puncture of an artery; nerve damage and irregular heartbeat and possibility to perform a PICC exchange if needed/ordered by physician.  Alternatives to this procedure were also discussed.  Bard Power PICC patient education guide, fact sheet on infection prevention and patient information card has been provided to patient /or left at bedside.    PICC Placement Documentation  PICC Double Lumen 06/16/20 PICC Right Cephalic 38 cm 0 cm (Active)  Indication for Insertion or Continuance of Line Administration of hyperosmolar/irritating solutions (i.e. TPN, Vancomycin, etc.) 06/16/20 1251  Exposed Catheter (cm) 0 cm 06/16/20 1251  Site Assessment Clean;Dry;Intact 06/16/20 1251  Lumen #1 Status Flushed;Blood return noted;Saline locked 06/16/20 1251  Lumen #2 Status Flushed;Blood return noted;Saline locked 06/16/20 1251  Dressing Type Transparent 06/16/20 1251  Dressing Status Clean;Intact;Dry 06/16/20 1251  Antimicrobial disc in place? Yes 06/16/20 1251  Dressing Change Due 06/23/20 06/16/20 1251       Scotty Court 06/16/2020, 12:54 PM

## 2020-06-16 NOTE — Progress Notes (Signed)
PHARMACY - TOTAL PARENTERAL NUTRITION CONSULT NOTE   Indication: Prolonged ileus  Patient Measurements: Height: 5' 8"  (172.7 cm) Weight: 108 kg (238 lb 1.6 oz) IBW/kg (Calculated) : 63.9 TPN AdjBW (KG): 75.7 Body mass index is 36.2 kg/m.  Assessment: 60 years of age female with past medical history of paroxysmal atrial fibrillation and tobacco use admitted on 06/05/20 with 1 month history of abdominal pain treated outpatient as constipation and uncomplicated diverticulitis. Pain progressively worsening despite antibiotic treatment outpatient. 06/05/20 CT showed sigmoid diverticulitis. On 06/15/20, patient symptoms changed to epigastric pain and worsening vomiting. Patient was made NPO. Patient was eating 50-100% of meals up until 06/13/20. CT 1/29 with worsening sigmoid diverticulitis and some degree of obstruction. Plan is to proceed with surgery later this week for bowel resection with colostomy.  Pharmacy consulted to start TPN for prolonged ileus.  Glucose / Insulin: No hx DM (A1c 5.6). CBGs 97-121 on D5-1/2NS at 75 ml/hr.  Electrolytes: K 4, no recent Mg or Phos.  Renal: SCr 0.85 - stable.  LFTs / TGs: LFTs  And Tbili are within normal limits.  Prealbumin / albumin: Albumin 2.8.  Intake / Output; MIVF: mIVF D5-1/2 at 75 ml/hr. Diarrhea overnight. Urine occurrences charted - volume not documented.  GI Imaging: 1/20 CT Abd: persistent sigmoid diverticulitis, no drainable fluid collection or abscess 1/29: CT Abd: worsening sigmoid diverticulitis, some degree of obstruction, no evidence for perforation or abscess Surgeries / Procedures: None, plan for surgery this week  Central access: PICC 06/16/20 TPN start date: 06/16/20  Nutritional Goals (per RD recommendation on PENDING): Pharmacist estimated goals using TPN AdjBW: kCal: 1900-2300, Protein: 90-110g, Fluid: >=2L Goal TPN rate (based on [facility] estimation): 85 ml/hr providing 110g protein daily and with MWF lipids providing weekly average of  2038 kcals/day  Current Nutrition:  NPO  Plan:  Start TPN at 40 mL/hr at 1800. Given shortage of ILE - if provided, will only be on MWF. Holding for 7-10 days can be considered - will await RD evaluation. Will hold lipids in TPN today.  Electrolytes in TPN: 41mq/L of Na, 547m/L of K, 68m30mL of Ca, 68mE268m of Mg, and 168mm77m of Phos. Cl:Ac ratio 1:1 Add standard MVI and trace elements to TPN Initiate Sensitive q4h SSI and adjust as needed  Reduce MIVF to 35 mL/hr at 1800 Monitor TPN labs on Mon/Thurs  JessiSloan LeiterrmD, BCPS, BCCCP Clinical Pharmacist Clinical phone 06/16/2020 - #[phone] refer to [facility] for [facility]ife[facility] 06/16/2020,10:02 AM

## 2020-06-17 DIAGNOSIS — K5792 Diverticulitis of intestine, part unspecified, without perforation or abscess without bleeding: Secondary | ICD-10-CM | POA: Diagnosis not present

## 2020-06-17 LAB — CBC
HCT: 33.9 % — ABNORMAL LOW (ref 36.0–46.0)
Hemoglobin: 10.2 g/dL — ABNORMAL LOW (ref 12.0–15.0)
MCH: 26.3 pg (ref 26.0–34.0)
MCHC: 30.1 g/dL (ref 30.0–36.0)
MCV: 87.4 fL (ref 80.0–100.0)
Platelets: 170 10*3/uL (ref 150–400)
RBC: 3.88 MIL/uL (ref 3.87–5.11)
RDW: 16.1 % — ABNORMAL HIGH (ref 11.5–15.5)
WBC: 5.6 10*3/uL (ref 4.0–10.5)
nRBC: 0 % (ref 0.0–0.2)

## 2020-06-17 LAB — DIFFERENTIAL
Abs Immature Granulocytes: 0.03 10*3/uL (ref 0.00–0.07)
Basophils Absolute: 0 10*3/uL (ref 0.0–0.1)
Basophils Relative: 1 %
Eosinophils Absolute: 0.1 10*3/uL (ref 0.0–0.5)
Eosinophils Relative: 2 %
Immature Granulocytes: 1 %
Lymphocytes Relative: 20 %
Lymphs Abs: 1.1 10*3/uL (ref 0.7–4.0)
Monocytes Absolute: 0.5 10*3/uL (ref 0.1–1.0)
Monocytes Relative: 8 %
Neutro Abs: 3.8 10*3/uL (ref 1.7–7.7)
Neutrophils Relative %: 68 %

## 2020-06-17 LAB — COMPREHENSIVE METABOLIC PANEL
ALT: 19 U/L (ref 0–44)
AST: 15 U/L (ref 15–41)
Albumin: 2.8 g/dL — ABNORMAL LOW (ref 3.5–5.0)
Alkaline Phosphatase: 87 U/L (ref 38–126)
Anion gap: 7 (ref 5–15)
BUN: 5 mg/dL — ABNORMAL LOW (ref 6–20)
CO2: 29 mmol/L (ref 22–32)
Calcium: 8.8 mg/dL — ABNORMAL LOW (ref 8.9–10.3)
Chloride: 99 mmol/L (ref 98–111)
Creatinine, Ser: 0.78 mg/dL (ref 0.44–1.00)
GFR, Estimated: >60 mL/min (ref >60)
Glucose, Bld: 114 mg/dL — ABNORMAL HIGH (ref 70–99)
Potassium: 3.5 mmol/L (ref 3.5–5.1)
Sodium: 135 mmol/L (ref 135–145)
Total Bilirubin: 0.7 mg/dL (ref 0.3–1.2)
Total Protein: 5.6 g/dL — ABNORMAL LOW (ref 6.5–8.1)

## 2020-06-17 LAB — PHOSPHORUS: Phosphorus: 3.9 mg/dL (ref 2.5–4.6)

## 2020-06-17 LAB — TRIGLYCERIDES: Triglycerides: 107 mg/dL (ref <150)

## 2020-06-17 LAB — GLUCOSE, CAPILLARY
Glucose-Capillary: 106 mg/dL — ABNORMAL HIGH (ref 70–99)
Glucose-Capillary: 106 mg/dL — ABNORMAL HIGH (ref 70–99)
Glucose-Capillary: 107 mg/dL — ABNORMAL HIGH (ref 70–99)
Glucose-Capillary: 112 mg/dL — ABNORMAL HIGH (ref 70–99)
Glucose-Capillary: 114 mg/dL — ABNORMAL HIGH (ref 70–99)
Glucose-Capillary: 96 mg/dL (ref 70–99)

## 2020-06-17 LAB — PREALBUMIN: Prealbumin: 14.5 mg/dL — ABNORMAL LOW (ref 18–38)

## 2020-06-17 LAB — HEPARIN LEVEL (UNFRACTIONATED): Heparin Unfractionated: 0.5 IU/mL (ref 0.30–0.70)

## 2020-06-17 LAB — MAGNESIUM: Magnesium: 1.8 mg/dL (ref 1.7–2.4)

## 2020-06-17 MED ORDER — TRACE MINERALS CU-MN-SE-ZN 300-55-60-3000 MCG/ML IV SOLN
INTRAVENOUS | Status: AC
  Filled 2020-06-17: qty 556.4

## 2020-06-17 MED ORDER — "THROMBI-PAD 3""X3"" EX PADS"
1.0000 | MEDICATED_PAD | CUTANEOUS | Status: AC
  Administered 2020-06-17: 1 via TOPICAL
  Filled 2020-06-17: qty 1

## 2020-06-17 MED ORDER — POTASSIUM CHLORIDE 10 MEQ/50ML IV SOLN
10.0000 meq | INTRAVENOUS | Status: AC
  Administered 2020-06-17 (×2): 10 meq via INTRAVENOUS
  Filled 2020-06-17 (×2): qty 50

## 2020-06-17 MED ORDER — MAGNESIUM SULFATE IN D5W 1-5 GM/100ML-% IV SOLN
1.0000 g | Freq: Once | INTRAVENOUS | Status: AC
  Administered 2020-06-17: 1 g via INTRAVENOUS
  Filled 2020-06-17: qty 100

## 2020-06-17 NOTE — Anesthesia Preprocedure Evaluation (Addendum)
Anesthesia Evaluation  Patient identified by MRN, date of birth, ID band Patient awake    Reviewed: Allergy & Precautions, NPO status , Patient's Chart, lab work & pertinent test results  History of Anesthesia Complications Negative for: history of anesthetic complications  Airway Mallampati: II  TM Distance: >3 FB Neck ROM: Full    Dental  (+) Poor Dentition, Dental Advisory Given   Pulmonary sleep apnea , Current Smoker and Patient abstained from smoking.,    Pulmonary exam normal        Cardiovascular hypertension, Pt. on medications Normal cardiovascular exam+ dysrhythmias Atrial Fibrillation      Neuro/Psych negative neurological ROS  negative psych ROS   GI/Hepatic Neg liver ROS,   Endo/Other    Renal/GU negative Renal ROS  negative genitourinary   Musculoskeletal negative musculoskeletal ROS (+)   Abdominal   Peds negative pediatric ROS (+)  Hematology negative hematology ROS (+)   Anesthesia Other Findings   Reproductive/Obstetrics negative OB ROS                            Anesthesia Physical  Anesthesia Plan  ASA: III  Anesthesia Plan: General   Post-op Pain Management:    Induction: Intravenous  PONV Risk Score and Plan: 4 or greater and Ondansetron, Dexamethasone, Midazolam and Scopolamine patch - Pre-op  Airway Management Planned: Oral ETT  Additional Equipment:   Intra-op Plan:   Post-operative Plan: Extubation in OR  Informed Consent: I have reviewed the patients History and Physical, chart, labs and discussed the procedure including the risks, benefits and alternatives for the proposed anesthesia with the patient or authorized representative who has indicated his/her understanding and acceptance.     Dental advisory given  Plan Discussed with: Anesthesiologist and CRNA  Anesthesia Plan Comments:        Anesthesia Quick Evaluation

## 2020-06-17 NOTE — Progress Notes (Signed)
Spoke with Loel Lofty, RN concerning bleeding at [facility] site. She placed call to MD requesting gel foam or quick clot. States, she and another nurse can change the dressing once she receives hemostat product.

## 2020-06-17 NOTE — Progress Notes (Signed)
PHARMACY - TOTAL PARENTERAL NUTRITION CONSULT NOTE   Indication: Prolonged ileus  Patient Measurements: Height: 5' 8"  (172.7 cm) Weight: 108 kg (238 lb 1.6 oz) IBW/kg (Calculated) : 63.9 TPN AdjBW (KG): 75.7 Body mass index is 36.2 kg/m.  Assessment: 60 years of age female with past medical history of paroxysmal atrial fibrillation and tobacco use admitted on 06/05/20 with 1 month history of abdominal pain treated outpatient as constipation and uncomplicated diverticulitis. Pain progressively worsening despite antibiotic treatment outpatient. 06/05/20 CT showed sigmoid diverticulitis. On 06/15/20, patient symptoms changed to epigastric pain and worsening vomiting. Patient was made NPO. Patient was eating 50-100% of meals up until 06/13/20. CT 1/29 with worsening sigmoid diverticulitis and some degree of obstruction. Plan is to proceed with surgery tomorrow for bowel resection with colostomy.  Pharmacy consulted to start TPN for prolonged ileus.  Glucose / Insulin: No hx DM (A1c 5.6). CBGs 98-114 on D5-1/2NS at 75 ml/hr.  Electrolytes: Na 135. K 3.5. Mg 1.8. Other electrolytes wnl.  Renal: SCr 0.78 - stable. BUN 5.  LFTs / TGs: LFTs  and Tbili wnl. Prealbumin / albumin: Albumin 2.8. Prealbumin 14.5 Intake / Output; MIVF: mIVF D5-1/2 at 75 ml/hr. UOP 0.2 ml/kg/hr. LBM 1/30.  GI Imaging: 1/20 CT Abd: persistent sigmoid diverticulitis, no drainable fluid collection or abscess 1/29: CT Abd: worsening sigmoid diverticulitis, some degree of obstruction, no evidence for perforation or abscess Surgeries / Procedures: None, plan for surgery this week  Central access: PICC 06/16/20 TPN start date: 06/16/20  Nutritional Goals (per RD recommendations on 1/25): 2000-2200 kcal, 100-110 g protein, fluid > 2L/day Goal TPN rate 85 ml/hr providing 110g protein daily and with 24 g/L lipids MWF providing weekly average of 2038 kcals/day - goal rate without lipids will meet ~91% of total kcal needs  Current  Nutrition:  NPO  Plan:  Increase TPN to 65 ml/hr at 1800 - this will provide 1395 kcal and 83 g AA Discussed with RD on 1/31 and given shortage of ILE will hold lipids for 7-10 days - if provided, will only be on MWF.  Electrolytes in TPN: 28mq/L of Na, 512m/L of K, 23m58mL of Ca, 23mE523m of Mg, and 123mm46m of Phos. Cl:Ac ratio 1:1 - no changes today Magnesium sulfate 1g x1  KCl 10 mEq IV x2 Add standard MVI and trace elements to TPN Initiate Sensitive q4h SSI and adjust as needed  Will follow up plans for MIVF with Surgery team Monitor TPN labs on Mon/Thurs, repeat electrolytes tomorrow  GraceCristela FeltrmD Clinical Pharmacist  06/17/2020,7:18 AM

## 2020-06-17 NOTE — Progress Notes (Signed)
PROGRESS NOTE    Tammy Davies  POE:[id number] DOB: March 15, 1961 DOA: 06/05/2020 PCP: Patient, No Pcp Per   Brief Narrative:   60 year old with history of paroxysmal A. fib on anticoagulation, tobacco use, obstructive sleep apnea on CPAP, HTN admitted for abdominal pain which has been ongoing for the past month which previously was treated as constipation and then uncomplicated diverticulitis about a week ago with Augmentin.  She presents with worsening of her symptoms.  CT abdomen pelvis showed sigmoid diverticulitis with small abscess.,  Consulted and started on Zosyn. Repeat CT showed persistent diverticulitis with absces     Subjective:  Poor appetite, reports no further nausea or vomiting since she has been n.p.o., reports no significant change in her abdominal pain.    Assessment & Plan:   Active Problems:   Acute diverticulitis  Acute complicated diverticulitis with small abscess -Patient treated initially with IV Zosyn, then IV Invanz. -It appears she failed conservative management, as most recent repeat CT abdomen pelvis showing evidence of obstruction, and forming strictures. -He is currently n.p.o., on TPN. -Plan for surgical intervention tomorrow. -Management per surgery.  COVID-19 infection, mainly asymptomatic - Asymptomatic.  Supportive care.  Chest x-ray 1/25 -negative.   -she is currently off her isolation.  As of 1/31, awaiting bed availability outside COVID-19 unit.   history of paroxysmal atrial fibrillation - On Cardizem, .heart rate intermittently uncontrolled requiring as needed digoxin -On Eliquis at home, currently requiring NGT given discussion of surgery.  Essential hypertension -continue home Cardizem   DVT prophylaxis: Heparin drip Code Status: Full code Family Communication: Patient is awake, alert and appropriate, I have discussed her case in details with her, and answered all her questions.  Status is: Inpatient  Remains inpatient  appropriate because:Inpatient level of care appropriate due to severity of illness   Dispo: The patient is from: Home              Anticipated d/c is to: Home              Anticipated d/c date is: 3 days              Patient currently is not medically stable to d/c.    Body mass index is 36.2 kg/m.   Examination:  Awake Alert, Oriented X 3, No new F.N deficits, Normal affect Symmetrical Chest wall movement, Good air movement bilaterally, CTAB RRR,No Gallops,Rubs or new Murmurs, No Parasternal Heave +ve B.Sounds, Abd Soft, LLQ  tenderness, No rebound - guarding or rigidity. No Cyanosis, Clubbing or edema, No new Rash or bruise        Objective: Vitals:   06/17/20 0536 06/17/20 1011 06/17/20 1017 06/17/20 1430  BP: 119/84 110/84  105/67  Pulse: 80   78  Resp: 15  20 16   Temp: 98.7 F (37.1 C)   (!) 97.5 F (36.4 C)  TempSrc: Oral     SpO2: 100%   95%  Weight:      Height:        Intake/Output Summary (Last 24 hours) at 06/17/2020 1517 Last data filed at 06/17/2020 0537 Gross per 24 hour  Intake 1128.25 ml  Output 500 ml  Net 628.25 ml   Filed Weights   06/14/20 1100 06/15/20 0430 06/16/20 0404  Weight: 110.3 kg 110.5 kg 108 kg     Data Reviewed:   CBC: Recent Labs  Lab 06/13/20 0500 06/14/20 1109 06/15/20 0515 06/16/20 0406 06/17/20 0400  WBC 6.8 5.8 6.5 5.5 5.6  NEUTROABS  --   --  5.2  --  3.8  HGB 10.3* 10.4* 11.0* 10.2* 10.2*  HCT 32.1* 34.2* 36.2 32.6* 33.9*  MCV 84.7 86.8 86.4 86.0 87.4  PLT 280 266 285 206 412   Basic Metabolic Panel: Recent Labs  Lab 06/11/20 0201 06/12/20 0244 06/12/20 1944 06/14/20 1109 06/15/20 0515 06/16/20 0406 06/17/20 0400  NA 138 137 137 138 136 136 135  K 3.9 3.9 4.2 3.9 4.0 4.0 3.5  CL 104 104 102 102 100 101 99  CO2 23 25 23 26 27 24 29   GLUCOSE 102* 92 99 100* 114* 107* 114*  BUN <5* 7 8 <5* 5* 6 5*  CREATININE 0.80 0.81 0.87 0.84 0.90 0.85 0.78  CALCIUM 9.2 9.2 9.0 9.3 9.4 8.7* 8.8*  MG 2.0 1.9   --   --   --   --  1.8  PHOS  --   --   --   --   --   --  3.9   GFR: Estimated Creatinine Clearance: 97.4 mL/min (by C-G formula based on SCr of 0.78 mg/dL). Liver Function Tests: Recent Labs  Lab 06/15/20 0515 06/16/20 0406 06/17/20 0400  AST 17 20 15   ALT 24 22 19   ALKPHOS 110 95 87  BILITOT 0.6 0.6 0.7  PROT 6.3* 5.7* 5.6*  ALBUMIN 3.0* 2.8* 2.8*   Recent Labs  Lab 06/16/20 0406  LIPASE 19   No results for input(s): AMMONIA in the last 168 hours. Coagulation Profile: No results for input(s): INR, PROTIME in the last 168 hours. Cardiac Enzymes: No results for input(s): CKTOTAL, CKMB, CKMBINDEX, TROPONINI in the last 168 hours. BNP (last 3 results) No results for input(s): PROBNP in the last 8760 hours. HbA1C: No results for input(s): HGBA1C in the last 72 hours. CBG: Recent Labs  Lab 06/16/20 2012 06/16/20 2351 06/17/20 0304 06/17/20 0747 06/17/20 1142  GLUCAP 99 115* 106* 114* 106*   Lipid Profile: Recent Labs    06/17/20 0400  TRIG 107   Thyroid Function Tests: No results for input(s): TSH, T4TOTAL, FREET4, T3FREE, THYROIDAB in the last 72 hours. Anemia Panel: No results for input(s): VITAMINB12, FOLATE, FERRITIN, TIBC, IRON, RETICCTPCT in the last 72 hours. Sepsis Labs: Recent Labs  Lab 06/11/20 0956  PROCALCITON <0.10    No results found for this or any previous visit (from the past 240 hour(s)).       Radiology Studies: CT ABDOMEN PELVIS W CONTRAST  Result Date: 06/15/2020 CLINICAL DATA:  Abdominal distension.  COVID positive. EXAM: CT ABDOMEN AND PELVIS WITH CONTRAST TECHNIQUE: Multidetector CT imaging of the abdomen and pelvis was performed using the standard protocol following bolus administration of intravenous contrast. CONTRAST:  158m OMNIPAQUE IOHEXOL 300 MG/ML  SOLN COMPARISON:  June 10, 2020 FINDINGS: Lower chest: There are small bilateral pleural effusions with adjacent atelectasis.The heart size is normal. 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 There is a similar hypoattenuating area adjacent to the gallbladder fossa. The patient is status post prior cholecystectomy.There is no biliary ductal dilation. Pancreas: Normal contours without ductal dilatation. No peripancreatic fluid collection. Spleen: Unremarkable. Adrenals/Urinary Tract: --Adrenal glands: Unremarkable. --Right kidney/ureter: No hydronephrosis or radiopaque kidney stones. --Left kidney/ureter: No hydronephrosis or radiopaque kidney stones. --Urinary bladder: Unremarkable. Stomach/Bowel: --Stomach/Duodenum: No hiatal hernia or other gastric abnormality. Normal duodenal course and caliber. --Small bowel: Unremarkable. --Colon: Again noted are extensive inflammatory changes of the sigmoid colon consistent with sigmoid diverticulitis. These changes have substantially worsened since the prior study. There  are now appears to be some degree of obstruction at this level as evidence by mildly dilated upstream colon with air-fluid levels. There are scattered colonic diverticula throughout the remaining portions of the colon. There is no abscess. There is no free air. --Appendix: Normal. Vascular/Lymphatic: Atherosclerotic calcification is present within the non-aneurysmal abdominal aorta, without hemodynamically significant stenosis. --No retroperitoneal lymphadenopathy. --No mesenteric lymphadenopathy. --No pelvic or inguinal lymphadenopathy. Reproductive: Unremarkable Other: No ascites or free air. The abdominal wall is normal. Musculoskeletal. No acute displaced fractures. IMPRESSION: 1. Worsening sigmoid diverticulitis, still without evidence for perforation or abscess formation. There now appears to be some degree of obstruction as evidence by dilatation of the upstream colon now with associated air-fluid levels. 2. Growing small bilateral pleural effusions. 3. Additional chronic findings as detailed above. Aortic Atherosclerosis (ICD10-I70.0).  Electronically Signed   By: Constance Holster M.D.   On: 06/15/2020 17:09   DG CHEST PORT 1 VIEW  Result Date: 06/16/2020 CLINICAL DATA:  PICC line placement EXAM: PORTABLE CHEST 1 VIEW COMPARISON:  06/11/2020 FINDINGS: Normal cardiac silhouette. PICC line pace with tip in distal SVC. Patchy airspace markings. No pneumothorax. No focal consolidation. IMPRESSION: RIGHT PICC line placed without complication. Patchy airspace opacities in lower lobes. Electronically Signed   By: Suzy Bouchard M.D.   On: 06/16/2020 14:16   [place] EKG SITE RITE  Result Date: 06/16/2020 If Site Rite image not attached, placement could not be confirmed due to current cardiac rhythm.  [place] EKG SITE RITE  Result Date: 06/16/2020 If Site Rite image not attached, placement could not be confirmed due to current cardiac rhythm.       Scheduled Meds: . Chlorhexidine Gluconate Cloth  6 each Topical Daily  . diltiazem  120 mg Oral Daily  . insulin aspart  0-9 Units Subcutaneous Q4H  . polyethylene glycol  17 g Oral BID  . saccharomyces boulardii  250 mg Oral BID   Continuous Infusions: . dextrose 5 % and 0.45% NaCl 75 mL/hr at 06/17/20 0153  . ertapenem 1,000 mg (06/16/20 1337)  . heparin 2,350 Units/hr (06/17/20 1032)  . potassium chloride 10 mEq (06/17/20 1423)  . TPN ADULT (ION) 40 mL/hr at 06/16/20 1705  . TPN ADULT (ION)       LOS: 11 days      Phillips Climes, MD [facility]  If 7PM-7AM, please contact night-coverage  06/17/2020, 3:17 PM

## 2020-06-17 NOTE — Progress Notes (Signed)
Nutrition Follow-up  DOCUMENTATION CODES:   Obesity unspecified  INTERVENTION:  TPN management per pharmacy   NUTRITION DIAGNOSIS:   Inadequate oral intake related to inability to eat as evidenced by NPO status.  ongoing  GOAL:   Patient will meet greater than or equal to 90% of their needs  Will address with TPN  MONITOR:   Skin,Weight trends,Labs,I & O's,Diet advancement  REASON FOR ASSESSMENT:   Consult New TPN/TNA  ASSESSMENT:   60 year old with history of paroxysmal A. fib on anticoagulation, tobacco use, obstructive sleep apnea on CPAP, HTN admitted for abdominal pain. CT abdomen pelvis showed sigmoid diverticulitis with small abscess. Pt COVID positive.  Pt continues to have LLQ abdominal pain, N/V, and liquid stool. Repeat CT on 1/29 revealed worsening sigmoid diverticulitis with some degree of obstruction vs stricture. Pt was made NPO. Per Surgery, pt has failed non-operative management and will now need surgery. Surgery discussed primary anastomosis but more likely a hartmanns with pt. Urology to place stents. Surgery tentatively planned for tomorrow.   Consult received for initiation of TPN. Per Pharmacy documentation, pt's TPN orders are as follows: Goal TPN rate 85 ml/hr providing 110g protein daily and with 24 g/L lipids MWF providing weekly average of 2038 kcals/day - goal rate without lipids will meet ~91% of total kcal needs. However, Pharmacy reached out to RD to discuss plan given shortage of ILE; agreed to hold lipids for 7-10 days.   Prior to being made NPO, pt ate 50-100% x 4 recorded meals (75% average intake).   Admit wt: 111.1 kg Current wt: 108 kg  UOP: 543ml x24 hours  Labs: CBGs [phone] Medications: ss novolog Q4H, miralax, florastor   Diet Order:   Diet Order            Diet NPO time specified Except for: Sips with Meds  Diet effective now           Diet - low sodium heart healthy                 EDUCATION NEEDS:   Not  appropriate for education at this time  Skin:  Skin Assessment: Reviewed RN Assessment  Last BM:  1/30 type 7  Height:   Ht Readings from Last 1 Encounters:  06/05/20 5\' 8"  (1.727 m)    Weight:   Wt Readings from Last 1 Encounters:  06/16/20 108 kg    BMI:  Body mass index is 36.2 kg/m.  Estimated Nutritional Needs:   Kcal:  2000-2200  Protein:  100-110 grams  Fluid:  >/= 2 L/day    Tammy Ina, MS, RD, LDN RD pager number and weekend/on-call pager number located in [place].

## 2020-06-17 NOTE — Progress Notes (Signed)
Subjective: CC: Still having some LLQ abdominal pain and nausea. Passing flatus. Continues to have liquid stool x 1 daily.   Objective: Vital signs in last 24 hours: Temp:  [98.6 F (37 C)-98.7 F (37.1 C)] 98.7 F (37.1 C) (01/31 0536) Pulse Rate:  [80-82] 80 (01/31 0536) Resp:  [12-18] 15 (01/31 0536) BP: (115-132)/(76-84) 119/84 (01/31 0536) SpO2:  [93 %-100 %] 100 % (01/31 0536) Last BM Date: 06/16/20  Intake/Output from previous day: 01/30 0701 - 01/31 0700 In: 1248.3 [P.O.:120; I.V.:1128.3] Out: 500 [Urine:500] Intake/Output this shift: No intake/output data recorded.  PE: Gen:  Alert, NAD, pleasant Card:  Reg rate Pulm:  CTA b/l, normal rate and effort Abd: Soft, mild distension, LLQ tenderness without peritonitis, +BS, prior hysterectomy and lap chole scars are well healed Ext:  No LE edema  Psych: A&Ox4 Skin: no rashes noted, warm and dry   Lab Results:  Recent Labs    06/16/20 0406 06/17/20 0400  WBC 5.5 5.6  HGB 10.2* 10.2*  HCT 32.6* 33.9*  PLT 206 170   BMET Recent Labs    06/16/20 0406 06/17/20 0400  NA 136 135  K 4.0 3.5  CL 101 99  CO2 24 29  GLUCOSE 107* 114*  BUN 6 5*  CREATININE 0.85 0.78  CALCIUM 8.7* 8.8*   PT/INR No results for input(s): LABPROT, INR in the last 72 hours. CMP     Component Value Date/Time   NA 135 06/17/2020 0400   NA 141 07/19/2019 0840   K 3.5 06/17/2020 0400   CL 99 06/17/2020 0400   CO2 29 06/17/2020 0400   GLUCOSE 114 (H) 06/17/2020 0400   BUN 5 (L) 06/17/2020 0400   BUN 13 07/19/2019 0840   CREATININE 0.78 06/17/2020 0400   CREATININE 0.88 09/09/2016 1146   CALCIUM 8.8 (L) 06/17/2020 0400   PROT 5.6 (L) 06/17/2020 0400   ALBUMIN 2.8 (L) 06/17/2020 0400   AST 15 06/17/2020 0400   ALT 19 06/17/2020 0400   ALKPHOS 87 06/17/2020 0400   BILITOT 0.7 06/17/2020 0400   GFRNONAA >60 06/17/2020 0400   GFRNONAA 74 09/09/2016 1146   GFRAA 105 07/19/2019 0840   GFRAA 86 09/09/2016 1146    Lipase     Component Value Date/Time   LIPASE 19 06/16/2020 0406       Studies/Results: CT ABDOMEN PELVIS W CONTRAST  Result Date: 06/15/2020 CLINICAL DATA:  Abdominal distension.  COVID positive. EXAM: CT ABDOMEN AND PELVIS WITH CONTRAST TECHNIQUE: Multidetector CT imaging of the abdomen and pelvis was performed using the standard protocol following bolus administration of intravenous contrast. CONTRAST:  124m OMNIPAQUE IOHEXOL 300 MG/ML  SOLN COMPARISON:  June 10, 2020 FINDINGS: Lower chest: There are small bilateral pleural effusions with adjacent atelectasis.The heart size is normal. 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 There is a similar hypoattenuating area adjacent to the gallbladder fossa. The patient is status post prior cholecystectomy.There is no biliary ductal dilation. Pancreas: Normal contours without ductal dilatation. No peripancreatic fluid collection. Spleen: Unremarkable. Adrenals/Urinary Tract: --Adrenal glands: Unremarkable. --Right kidney/ureter: No hydronephrosis or radiopaque kidney stones. --Left kidney/ureter: No hydronephrosis or radiopaque kidney stones. --Urinary bladder: Unremarkable. Stomach/Bowel: --Stomach/Duodenum: No hiatal hernia or other gastric abnormality. Normal duodenal course and caliber. --Small bowel: Unremarkable. --Colon: Again noted are extensive inflammatory changes of the sigmoid colon consistent with sigmoid diverticulitis. These changes have substantially worsened since the prior study. There are now appears to be  some degree of obstruction at this level as evidence by mildly dilated upstream colon with air-fluid levels. There are scattered colonic diverticula throughout the remaining portions of the colon. There is no abscess. There is no free air. --Appendix: Normal. Vascular/Lymphatic: Atherosclerotic calcification is present within the non-aneurysmal abdominal aorta, without hemodynamically significant  stenosis. --No retroperitoneal lymphadenopathy. --No mesenteric lymphadenopathy. --No pelvic or inguinal lymphadenopathy. Reproductive: Unremarkable Other: No ascites or free air. The abdominal wall is normal. Musculoskeletal. No acute displaced fractures. IMPRESSION: 1. Worsening sigmoid diverticulitis, still without evidence for perforation or abscess formation. There now appears to be some degree of obstruction as evidence by dilatation of the upstream colon now with associated air-fluid levels. 2. Growing small bilateral pleural effusions. 3. Additional chronic findings as detailed above. Aortic Atherosclerosis (ICD10-I70.0). Electronically Signed   By: Constance Holster M.D.   On: 06/15/2020 17:09   DG CHEST PORT 1 VIEW  Result Date: 06/16/2020 CLINICAL DATA:  PICC line placement EXAM: PORTABLE CHEST 1 VIEW COMPARISON:  06/11/2020 FINDINGS: Normal cardiac silhouette. PICC line pace with tip in distal SVC. Patchy airspace markings. No pneumothorax. No focal consolidation. IMPRESSION: RIGHT PICC line placed without complication. Patchy airspace opacities in lower lobes. Electronically Signed   By: Suzy Bouchard M.D.   On: 06/16/2020 14:16   [place] EKG SITE RITE  Result Date: 06/16/2020 If Site Rite image not attached, placement could not be confirmed due to current cardiac rhythm.  [place] EKG SITE RITE  Result Date: 06/16/2020 If Site Rite image not attached, placement could not be confirmed due to current cardiac rhythm.   Anti-infectives: Anti-infectives (From admission, onward)   Start     Dose/Rate Route Frequency Ordered Stop   06/14/20 0000  ciprofloxacin (CIPRO) 500 MG tablet        500 mg Oral 2 times daily 06/14/20 1100 06/24/20 2359   06/14/20 0000  metroNIDAZOLE (FLAGYL) 500 MG tablet        500 mg Oral 3 times daily 06/14/20 1100 06/24/20 2359   06/11/20 1230  ertapenem (INVANZ) 1,000 mg in sodium chloride 0.9 % 100 mL IVPB        1 g 200 mL/hr over 30 Minutes Intravenous Every 24  hours 06/11/20 1114     06/06/20 1850  piperacillin-tazobactam (ZOSYN) IVPB 3.375 g  Status:  Discontinued        3.375 g 12.5 mL/hr over 240 Minutes Intravenous Every 8 hours 06/06/20 1850 06/11/20 1114   06/06/20 1830  piperacillin-tazobactam (ZOSYN) IVPB 3.375 g        3.375 g 100 mL/hr over 30 Minutes Intravenous  Once 06/06/20 1818 06/06/20 2039       Assessment/Plan Paroxysmal A. Fib on Eliquis at home  OSA HTN COVID + No prior colonoscopy  Hx of abdominal hysterectomy and cholecystectomy   Acute sigmoid diverticulitis with small phlegmon/ abscess in distal sigmoid colon - Initially dx w/ diverticulitis on 1/12 and d/c with Augmentin. Failed outpatient therapy. CT 1/20 showed sigmoid diverticulitis w/ small phlegmon vs abscess just inferior to distal sigmoid colon measuring3.4 x 3.3 x 2.7 cm. Our team felt this was to small for IR drainage.  - repeat CT 1/24 shows overall no significant improvement in the inflammatory changes of the sigmoid colon; small amount of partially loculated fluid or phlegmon to the left of the sigmoid colon with no discrete drainable fluid collection; 2.2 x 1.8 cm ill-defined inflammatory area inferior to the sigmoid colon; no extraluminal air, no bowel obstruction - repeat CT  1/29 w/ Worsening sigmoid diverticulitis, still without evidence for perforation or abscess formation. There now appears to be some degree of obstruction as evidence by dilatation of the upstream colon now with associated air-fluid levels. - Given patients ongoing symptoms, with worsening CT scan findings now concerning for obstruction vs stricture, will plan for OR tomorrow. Will ask Urology to stent before surgery. Patient will be off COVID precautions tomorrow per notes.  - The anatomy, physiology of the GI tract was discussed with the patient. The pathophysiology of the patients diverticulitis was discussed with the patient. The planned procedure and material risks including, but  not limited to general anethesia (MI, CVA, death, prolonged intubation, PNA), pain, bleeding, infection, scarring, damage to surrounding structures (blood vessels/nerves/viscus/organs/ureter(s)), worsening of pre-existing medical conditions and hernia were discussed. We discuss she will likely have a stoma/colostomy. We discussed this may be permanent. The patient's questions were answered to their satisfaction, they voiced understanding and elected to proceed with surgery. Additionally, we discussed typical postoperative expectations and the recovery process. She reports that she has taken care of a colostomy before. She lives at home with her two sons.  - Continue abx  - Cont TPN - WOCN marking - Mobilize - Pulm toilet.   FEN - NPO, TPN VTE -SCDs, heparin gtt (Eliquis on hold). Hold heparin at midnight.  ID - Augmentin 1/12 - 1/20.Zosyn1/20 - 1/25. Invanz 1/25 >>    LOS: 11 days    Jillyn Ledger , [facility] 06/17/2020, 9:24 AM Please see Amion for pager number during day hours 7:00am-4:30pm

## 2020-06-17 NOTE — Progress Notes (Signed)
CC:  Subjective: Doing great, no n/v, woc seeing her now, stents out as of this am- I left them in due to bloody urine during surgery which has now cleared  Objective: Vital signs in last 24 hours: Temp:  [98.6 F (37 C)-98.7 F (37.1 C)] 98.7 F (37.1 C) (01/31 0536) Pulse Rate:  [80-82] 80 (01/31 0536) Resp:  [12-18] 15 (01/31 0536) BP: (115-132)/(76-84) 119/84 (01/31 0536) SpO2:  [93 %-100 %] 100 % (01/31 0536) Last BM Date: 06/16/20 1:20 PM 1128 IV Urine 500 BM x1 Afebrile, vital signs are stable CMP stable, prealbumin 14.5 WBC 5.6 H/H 10.2/33.9 Platelets 170,000   iIntake/Output from previous day: 01/30 0701 - 01/31 0700 In: 1248.3 [P.O.:120; I.V.:1128.3] Out: 500 [Urine:500] Intake/Output this shift: No intake/output data recorded.  abd soft ostomy pink and viable, drain serosang, wound clean- vac now placed, few bs   Lab Results:  Recent Labs    06/16/20 0406 06/17/20 0400  WBC 5.5 5.6  HGB 10.2* 10.2*  HCT 32.6* 33.9*  PLT 206 170    BMET Recent Labs    06/16/20 0406 06/17/20 0400  NA 136 135  K 4.0 3.5  CL 101 99  CO2 24 29  GLUCOSE 107* 114*  BUN 6 5*  CREATININE 0.85 0.78  CALCIUM 8.7* 8.8*   PT/INR No results for input(s): LABPROT, INR in the last 72 hours.  Recent Labs  Lab 06/15/20 0515 06/16/20 0406 06/17/20 0400  AST 17 20 15   ALT 24 22 19   ALKPHOS 110 95 87  BILITOT 0.6 0.6 0.7  PROT 6.3* 5.7* 5.6*  ALBUMIN 3.0* 2.8* 2.8*     Lipase     Component Value Date/Time   LIPASE 19 06/16/2020 0406     Medications: . Chlorhexidine Gluconate Cloth  6 each Topical Daily  . diltiazem  120 mg Oral Daily  . insulin aspart  0-9 Units Subcutaneous Q4H  . polyethylene glycol  17 g Oral BID  . saccharomyces boulardii  250 mg Oral BID   . dextrose 5 % and 0.45% NaCl 75 mL/hr at 06/17/20 0153  . ertapenem 1,000 mg (06/16/20 1337)  . heparin 2,350 Units/hr (06/17/20 0016)  . TPN ADULT (ION) 40 mL/hr at 06/16/20 1705    Anti-infectives (From admission, onward)   Start     Dose/Rate Route Frequency Ordered Stop   06/14/20 0000  ciprofloxacin (CIPRO) 500 MG tablet        500 mg Oral 2 times daily 06/14/20 1100 06/24/20 2359   06/14/20 0000  metroNIDAZOLE (FLAGYL) 500 MG tablet        500 mg Oral 3 times daily 06/14/20 1100 06/24/20 2359   06/11/20 1230  ertapenem (INVANZ) 1,000 mg in sodium chloride 0.9 % 100 mL IVPB        1 g 200 mL/hr over 30 Minutes Intravenous Every 24 hours 06/11/20 1114     06/06/20 1850  piperacillin-tazobactam (ZOSYN) IVPB 3.375 g  Status:  Discontinued        3.375 g 12.5 mL/hr over 240 Minutes Intravenous Every 8 hours 06/06/20 1850 06/11/20 1114   06/06/20 1830  piperacillin-tazobactam (ZOSYN) IVPB 3.375 g        3.375 g 100 mL/hr over 30 Minutes Intravenous  Once 06/06/20 1818 06/06/20 2039       Assessment/Plan POD 1 sigmoid colectomy/small bowel resection- Tammy Davies -clear liquids today -five days abx postop -vac on wound placed today M/W/F -leave foley until tomorrow- urine has cleared grossly -  aggressive pulm toilet -hold heparin until tomorrow- I will do proph lovenox today FEN: clears/TPN ID: Zosyn 1/20 -1/25;  Invanz 1/25 >>  DVT: lovenox today, hold heparin gtt Follow-up: TBD   LOS: 11 days    Davies,Tammy 06/17/2020 Please see Amion

## 2020-06-17 NOTE — Consult Note (Signed)
I have been asked to see the patient by Dr. Serita Grammes, for evaluation and placement of bilateral ureteral stents.  History of present illness: 60 year old woman admitted with diverticulitis on 05/30/2019 who failed outpatient management and was then readmitted and found to have worsening  Phlegmon vs abscess.  Repeat CT 1/24 and 1/29 shows worsening diverticulitis with possible obstruction versus stricture.  Patient was also Covid+and off of precautions per chart review.  General surgery plans to take her to the operating room tomorrow and has requested urology placed bilateral ureteral stents. Patient has no prior urologic history. She has never had a UTI before. She denies kidney stones, gross hematuria, and prior surgery on kidneys/ureters.   Review of systems: A 12 point comprehensive review of systems was obtained and is negative unless otherwise stated in the history of present illness.  Patient Active Problem List   Diagnosis Date Noted  . Acute diverticulitis 06/06/2020  . Elevated glucose 06/08/2017  . Hypertension 06/08/2017  . S/P laparoscopic cholecystectomy 08/20/2016  . Tobacco abuse 08/20/2016  . AF (paroxysmal atrial fibrillation) ([place]) 08/20/2016  . Pancreatitis 08/15/2016  . Liver mass, 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    No current facility-administered medications on file prior to encounter.   Current Outpatient Medications on File Prior to Encounter  Medication Sig Dispense Refill  . dicyclomine (BENTYL) 20 MG tablet Take 1 tablet (20 mg total) by mouth every 8 (eight) hours as needed for spasms (Abdominal cramping). 15 tablet 0  . ibuprofen (ADVIL) 200 MG tablet Take 600 mg by mouth every 12 (twelve) hours as needed for headache or moderate pain.    . Multiple Vitamin (MULTIVITAMIN) capsule Take 1 capsule by mouth daily.    . ondansetron (ZOFRAN ODT) 4 MG disintegrating tablet Take 1 tablet (4 mg total) by mouth every 8 (eight) hours as needed for nausea or vomiting. 20  tablet 0    Past Medical History:  Diagnosis Date  . Dysrhythmia   . PAF (paroxysmal atrial fibrillation) ([place])   . Tobacco abuse     Past Surgical History:  Procedure Laterality Date  . ABDOMINAL HYSTERECTOMY    . CHOLECYSTECTOMY N/A 08/18/2016   Procedure: LAPAROSCOPIC CHOLECYSTECTOMY WITH  INTRAOPERATIVE CHOLANGIOGRAM;  Surgeon: Johnathan Hausen, MD;  Location: [facility];  Service: General;  Laterality: N/A;    Social History   Tobacco Use  . Smoking status: Current Every Day Smoker    Types: Cigarettes  . Smokeless tobacco: Never Used  Vaping Use  . Vaping Use: Never used  Substance Use Topics  . Alcohol use: No  . Drug use: Yes    Types: Marijuana    Family History  Problem Relation Age of Onset  . Heart failure Mother   . Breast cancer Mother   . Heart failure Father   . Diabetes Father   . Microcephaly Father     PE: Vitals:   06/17/20 0536 06/17/20 1011 06/17/20 1017 06/17/20 1430  BP: 119/84 110/84  105/67  Pulse: 80   78  Resp: 15  20 16   Temp: 98.7 F (37.1 C)   (!) 97.5 F (36.4 C)  TempSrc: Oral     SpO2: 100%   95%  Weight:      Height:       Patient appears to be in no acute distress  patient is alert and oriented x3 Atraumatic normocephalic head No increased work of breathing, no audible wheezes/rhonchi Abdomen is soft, nontender, nondistended Lower extremities are symmetric without appreciable edema Grossly  neurologically intact No identifiable skin lesions  Recent Labs    06/15/20 0515 06/16/20 0406 06/17/20 0400  WBC 6.5 5.5 5.6  HGB 11.0* 10.2* 10.2*  HCT 36.2 32.6* 33.9*   Recent Labs    06/15/20 0515 06/16/20 0406 06/17/20 0400  NA 136 136 135  K 4.0 4.0 3.5  CL 100 101 99  CO2 27 24 29   GLUCOSE 114* 107* 114*  BUN 5* 6 5*  CREATININE 0.90 0.85 0.78  CALCIUM 9.4 8.7* 8.8*   No results for input(s): LABPT, INR in the last 72 hours. No results for input(s): LABURIN in the last 72 hours. Results for orders placed or  performed during the hospital encounter of 06/05/20  SARS CORONAVIRUS 2 (TAT 6-24 HRS) Nasopharyngeal Nasopharyngeal Swab     Status: Abnormal   Collection Time: 06/06/20  3:08 PM   Specimen: Nasopharyngeal Swab  Result Value Ref Range Status   SARS Coronavirus 2 POSITIVE (A) NEGATIVE Final    Comment: (NOTE) SARS-CoV-2 target nucleic acids are DETECTED.  The SARS-CoV-2 RNA is generally detectable in upper and lower respiratory specimens during the acute phase of infection. Positive results are indicative of the presence of SARS-CoV-2 RNA. Clinical correlation with patient history and other diagnostic information is  necessary to determine patient infection status. Positive results do not rule out bacterial infection or co-infection with other viruses.  The expected result is Negative.  Fact Sheet for Patients: SugarRoll.be  Fact Sheet for Healthcare Providers: https://www.woods-mathews.com/  This test is not yet approved or cleared by the [place] FDA and  has been authorized for detection and/or diagnosis of SARS-CoV-2 by FDA under an Emergency Use Authorization (EUA). This EUA will remain  in effect (meaning this test can be used) for the duration of the COVID-19 declaration under Section 564(b)(1) of the Act, 21 U. S.C. section 360bbb-3(b)(1), unless the authorization is terminated or revoked sooner.   Performed at [facility], [address], [place], [place] [address]     Imaging: CT Abd/Pelvis 06/15/20 IMPRESSION: 1. Worsening sigmoid diverticulitis, still without evidence for perforation or abscess formation. There now appears to be some degree of obstruction as evidence by dilatation of the upstream colon now with associated air-fluid levels. 2. Growing small bilateral pleural effusions. 3. Additional chronic findings as detailed above.  Aortic Atherosclerosis (ICD10-I70.0).  A/P: 60 year old woman with  worsening sigmoid diverticulitis now with possible obstruction versus stricture. General surgery plans to take patient to the OR tomorrow morning for possible bowel resection and colostomy. Urology has been consulted for bilateral ureteral stent placement at the start of the case.  -I discussed the risks and benefits of cystoscopy with bilateral ureteral stent placement in detail and patient is willing to proceed -cystoscopy with bilateral ureteral stent placement posted to the OR and confirmed with OR equipment needs -Dr. Tresa Moore to perform the procedure   Thank you for involving me in this patient's care. Sharesa Kemp D Laylah Riga

## 2020-06-17 NOTE — Progress Notes (Signed)
ANTICOAGULATION CONSULT NOTE - Follow Up Consult  Pharmacy Consult for IV Heparin Indication: atrial fibrillation  No Known Allergies  Patient Measurements: Height: 5' 8"  (172.7 cm) Weight: 108 kg (238 lb 1.6 oz) IBW/kg (Calculated) : 63.9 Heparin Dosing Weight: 89 kg  Vital Signs: Temp: 98.7 F (37.1 C) (01/31 0536) Temp Source: Oral (01/31 0536) BP: 110/84 (01/31 1011) Pulse Rate: 80 (01/31 0536)  Labs: Recent Labs    06/15/20 0515 06/15/20 1526 06/16/20 0406 06/17/20 0400  HGB 11.0*  --  10.2* 10.2*  HCT 36.2  --  32.6* 33.9*  PLT 285  --  206 170  HEPARINUNFRC 0.25* 0.51 0.45 0.50  CREATININE 0.90  --  0.85 0.78    Estimated Creatinine Clearance: 97.4 mL/min (by C-G formula based on SCr of 0.78 mg/dL).   Medical History: Past Medical History:  Diagnosis Date  . Dysrhythmia   . PAF (paroxysmal atrial fibrillation) ([place])   . Tobacco abuse     Assessment: Pt was recently discharged from [facility] for diverticulitis with possible abscess. Pt now has epigastric pain with worsening vomiting. She is on chronic apixaban for afib (last dose on 1/19). Plan is to hold apixaban and bridge with IV heparin for possible intervention if needed. Pharmacy was consulted to dose IV heparin until cleared from surgical team.    Heparin cont to be therapeutic today. CBC is stable. Plan to hold heparin tonight in preparation for surgery in AM.   Goal of Therapy:  Heparin level 0.3-0.7 units/ml Monitor platelets by anticoagulation protocol: Yes   Plan:  Continue heparin infusion at 2350 units/hr until Midnight Monitor for signs/symptoms of bleeding  F/u switch back to apixaban when allow post surgery   Onnie Boer, PharmD, BCIDP, AAHIVP, CPP Infectious Disease Pharmacist 06/17/2020 1:55 PM

## 2020-06-18 ENCOUNTER — Inpatient Hospital Stay (HOSPITAL_COMMUNITY): Payer: No Typology Code available for payment source

## 2020-06-18 ENCOUNTER — Inpatient Hospital Stay (HOSPITAL_COMMUNITY): Payer: No Typology Code available for payment source | Admitting: Anesthesiology

## 2020-06-18 ENCOUNTER — Encounter (HOSPITAL_COMMUNITY): Payer: Self-pay | Admitting: Obstetrics and Gynecology

## 2020-06-18 ENCOUNTER — Encounter (HOSPITAL_COMMUNITY)
Admission: EM | Disposition: A | Discharge status: Home / Self Care | Payer: Self-pay | Source: Home / Self Care | Attending: Internal Medicine

## 2020-06-18 DIAGNOSIS — Z7901 Long term (current) use of anticoagulants: Secondary | ICD-10-CM | POA: Diagnosis not present

## 2020-06-18 DIAGNOSIS — I480 Paroxysmal atrial fibrillation: Secondary | ICD-10-CM | POA: Diagnosis present

## 2020-06-18 DIAGNOSIS — Z933 Colostomy status: Secondary | ICD-10-CM

## 2020-06-18 DIAGNOSIS — E440 Moderate protein-calorie malnutrition: Secondary | ICD-10-CM

## 2020-06-18 DIAGNOSIS — K5792 Diverticulitis of intestine, part unspecified, without perforation or abscess without bleeding: Secondary | ICD-10-CM | POA: Diagnosis not present

## 2020-06-18 DIAGNOSIS — Z8616 Personal history of COVID-19: Secondary | ICD-10-CM | POA: Diagnosis present

## 2020-06-18 DIAGNOSIS — R0902 Hypoxemia: Secondary | ICD-10-CM

## 2020-06-18 DIAGNOSIS — N3091 Cystitis, unspecified with hematuria: Secondary | ICD-10-CM

## 2020-06-18 DIAGNOSIS — K5720 Diverticulitis of large intestine with perforation and abscess without bleeding: Principal | ICD-10-CM

## 2020-06-18 DIAGNOSIS — E669 Obesity, unspecified: Secondary | ICD-10-CM

## 2020-06-18 DIAGNOSIS — Z960 Presence of urogenital implants: Secondary | ICD-10-CM

## 2020-06-18 HISTORY — PX: COLECTOMY WITH COLOSTOMY CREATION/HARTMANN PROCEDURE: SHX6598

## 2020-06-18 HISTORY — PX: LAPAROTOMY: SHX154

## 2020-06-18 HISTORY — PX: CYSTOSCOPY WITH STENT PLACEMENT: SHX5790

## 2020-06-18 LAB — BLOOD GAS, ARTERIAL
Acid-Base Excess: 1.2 mmol/L (ref 0.0–2.0)
Bicarbonate: 25.6 mmol/L (ref 20.0–28.0)
FIO2: 36
O2 Saturation: 94.9 %
Patient temperature: 36.5
pCO2 arterial: 42.2 mmHg (ref 32.0–48.0)
pH, Arterial: 7.397 (ref 7.350–7.450)
pO2, Arterial: 77.1 mmHg — ABNORMAL LOW (ref 83.0–108.0)

## 2020-06-18 LAB — BASIC METABOLIC PANEL
Anion gap: 7 (ref 5–15)
Anion gap: 9 (ref 5–15)
BUN: 7 mg/dL (ref 6–20)
BUN: 9 mg/dL (ref 6–20)
CO2: 28 mmol/L (ref 22–32)
CO2: 23 mmol/L (ref 22–32)
Calcium: 8.6 mg/dL — ABNORMAL LOW (ref 8.9–10.3)
Calcium: 8.3 mg/dL — ABNORMAL LOW (ref 8.9–10.3)
Chloride: 100 mmol/L (ref 98–111)
Chloride: 101 mmol/L (ref 98–111)
Creatinine, Ser: 0.76 mg/dL (ref 0.44–1.00)
Creatinine, Ser: 0.82 mg/dL (ref 0.44–1.00)
GFR, Estimated: >60 mL/min (ref >60)
GFR, Estimated: >60 mL/min (ref >60)
Glucose, Bld: 101 mg/dL — ABNORMAL HIGH (ref 70–99)
Glucose, Bld: 217 mg/dL — ABNORMAL HIGH (ref 70–99)
Potassium: 4.1 mmol/L (ref 3.5–5.1)
Potassium: 4.1 mmol/L (ref 3.5–5.1)
Sodium: 135 mmol/L (ref 135–145)
Sodium: 133 mmol/L — ABNORMAL LOW (ref 135–145)

## 2020-06-18 LAB — CBC
HCT: 31.7 % — ABNORMAL LOW (ref 36.0–46.0)
HCT: 32.6 % — ABNORMAL LOW (ref 36.0–46.0)
Hemoglobin: 9.9 g/dL — ABNORMAL LOW (ref 12.0–15.0)
Hemoglobin: 10 g/dL — ABNORMAL LOW (ref 12.0–15.0)
MCH: 27 pg (ref 26.0–34.0)
MCH: 26.7 pg (ref 26.0–34.0)
MCHC: 31.2 g/dL (ref 30.0–36.0)
MCHC: 30.7 g/dL (ref 30.0–36.0)
MCV: 86.6 fL (ref 80.0–100.0)
MCV: 86.9 fL (ref 80.0–100.0)
Platelets: 120 10*3/uL — ABNORMAL LOW (ref 150–400)
Platelets: 141 10*3/uL — ABNORMAL LOW (ref 150–400)
RBC: 3.66 MIL/uL — ABNORMAL LOW (ref 3.87–5.11)
RBC: 3.75 MIL/uL — ABNORMAL LOW (ref 3.87–5.11)
RDW: 16.1 % — ABNORMAL HIGH (ref 11.5–15.5)
RDW: 16 % — ABNORMAL HIGH (ref 11.5–15.5)
WBC: 4.5 10*3/uL (ref 4.0–10.5)
WBC: 13 10*3/uL — ABNORMAL HIGH (ref 4.0–10.5)
nRBC: 0 % (ref 0.0–0.2)
nRBC: 0 % (ref 0.0–0.2)

## 2020-06-18 LAB — POCT I-STAT 7, (LYTES, BLD GAS, ICA,H+H)
Acid-base deficit: 2 mmol/L (ref 0.0–2.0)
Bicarbonate: 23.3 mmol/L (ref 20.0–28.0)
Calcium, Ion: 1.12 mmol/L — ABNORMAL LOW (ref 1.15–1.40)
HCT: 28 % — ABNORMAL LOW (ref 36.0–46.0)
Hemoglobin: 9.5 g/dL — ABNORMAL LOW (ref 12.0–15.0)
O2 Saturation: 95 %
Potassium: 4 mmol/L (ref 3.5–5.1)
Sodium: 138 mmol/L (ref 135–145)
TCO2: 24 mmol/L (ref 22–32)
pCO2 arterial: 38.7 mmHg (ref 32.0–48.0)
pH, Arterial: 7.388 (ref 7.350–7.450)
pO2, Arterial: 77 mmHg — ABNORMAL LOW (ref 83.0–108.0)

## 2020-06-18 LAB — GLUCOSE, CAPILLARY
Glucose-Capillary: 102 mg/dL — ABNORMAL HIGH (ref 70–99)
Glucose-Capillary: 100 mg/dL — ABNORMAL HIGH (ref 70–99)
Glucose-Capillary: 191 mg/dL — ABNORMAL HIGH (ref 70–99)

## 2020-06-18 LAB — MAGNESIUM: Magnesium: 2 mg/dL (ref 1.7–2.4)

## 2020-06-18 LAB — PHOSPHORUS: Phosphorus: 4.3 mg/dL (ref 2.5–4.6)

## 2020-06-18 SURGERY — COLECTOMY, WITH COLOSTOMY CREATION
Anesthesia: General | Site: Urethra

## 2020-06-18 MED ORDER — FENTANYL CITRATE (PF) 250 MCG/5ML IJ SOLN
INTRAMUSCULAR | Status: AC
  Filled 2020-06-18: qty 5

## 2020-06-18 MED ORDER — LACTATED RINGERS IV SOLN: INTRAVENOUS | Status: DC | PRN

## 2020-06-18 MED ORDER — IOHEXOL 300 MG/ML  SOLN
INTRAMUSCULAR | Status: DC | PRN
  Administered 2020-06-18: 10 mL

## 2020-06-18 MED ORDER — HYDROMORPHONE HCL 1 MG/ML IJ SOLN
0.5000 mg | INTRAMUSCULAR | Status: DC | PRN
  Administered 2020-06-18: 1 mg via INTRAVENOUS
  Administered 2020-06-18: 2 mg via INTRAVENOUS
  Administered 2020-06-19 – 2020-06-21 (×12): 1 mg via INTRAVENOUS
  Administered 2020-06-21: 2 mg via INTRAVENOUS
  Administered 2020-06-22 – 2020-06-24 (×13): 1 mg via INTRAVENOUS
  Administered 2020-06-24 (×2): 2 mg via INTRAVENOUS
  Filled 2020-06-18: qty 2
  Filled 2020-06-18: qty 1
  Filled 2020-06-18 (×2): qty 2
  Filled 2020-06-18 (×14): qty 1
  Filled 2020-06-18: qty 2
  Filled 2020-06-18 (×11): qty 1

## 2020-06-18 MED ORDER — MAGIC MOUTHWASH
15.0000 mL | Freq: Four times a day (QID) | ORAL | Status: DC | PRN
  Filled 2020-06-18: qty 15

## 2020-06-18 MED ORDER — ALBUMIN HUMAN 5 % IV SOLN: INTRAVENOUS | Status: DC | PRN

## 2020-06-18 MED ORDER — METHOCARBAMOL 1000 MG/10ML IJ SOLN
1000.0000 mg | Freq: Four times a day (QID) | INTRAVENOUS | Status: DC | PRN
  Administered 2020-06-18 – 2020-06-20 (×2): 1000 mg via INTRAVENOUS
  Filled 2020-06-18 (×2): qty 10

## 2020-06-18 MED ORDER — PIPERACILLIN-TAZOBACTAM 3.375 G IVPB
3.3750 g | Freq: Three times a day (TID) | INTRAVENOUS | Status: AC
  Administered 2020-06-18 – 2020-06-23 (×15): 3.375 g via INTRAVENOUS
  Filled 2020-06-18 (×14): qty 50

## 2020-06-18 MED ORDER — CELECOXIB 200 MG PO CAPS
200.0000 mg | ORAL_CAPSULE | Freq: Once | ORAL | Status: AC
  Administered 2020-06-18: 200 mg via ORAL
  Filled 2020-06-18: qty 1

## 2020-06-18 MED ORDER — EPHEDRINE 5 MG/ML INJ
INTRAVENOUS | Status: AC
  Filled 2020-06-18: qty 10

## 2020-06-18 MED ORDER — ESMOLOL HCL 100 MG/10ML IV SOLN
INTRAVENOUS | Status: AC
  Filled 2020-06-18: qty 10

## 2020-06-18 MED ORDER — DEXAMETHASONE SODIUM PHOSPHATE 10 MG/ML IJ SOLN
INTRAMUSCULAR | Status: AC
  Filled 2020-06-18: qty 1

## 2020-06-18 MED ORDER — 0.9 % SODIUM CHLORIDE (POUR BTL) OPTIME
TOPICAL | Status: DC | PRN
  Administered 2020-06-18 (×3): 1000 mL

## 2020-06-18 MED ORDER — MIDAZOLAM HCL 5 MG/5ML IJ SOLN
INTRAMUSCULAR | Status: DC | PRN
  Administered 2020-06-18: 2 mg via INTRAVENOUS

## 2020-06-18 MED ORDER — FENTANYL CITRATE (PF) 250 MCG/5ML IJ SOLN
INTRAMUSCULAR | Status: DC | PRN
  Administered 2020-06-18: 50 ug via INTRAVENOUS
  Administered 2020-06-18 (×2): 100 ug via INTRAVENOUS

## 2020-06-18 MED ORDER — ACETAMINOPHEN 10 MG/ML IV SOLN
INTRAVENOUS | Status: AC
  Filled 2020-06-18: qty 100

## 2020-06-18 MED ORDER — FENTANYL CITRATE (PF) 100 MCG/2ML IJ SOLN
INTRAMUSCULAR | Status: AC
  Filled 2020-06-18: qty 2

## 2020-06-18 MED ORDER — ALBUTEROL SULFATE HFA 108 (90 BASE) MCG/ACT IN AERS
INHALATION_SPRAY | RESPIRATORY_TRACT | Status: DC | PRN
  Administered 2020-06-18 (×2): 4 via RESPIRATORY_TRACT

## 2020-06-18 MED ORDER — PROPOFOL 10 MG/ML IV BOLUS
INTRAVENOUS | Status: AC
  Filled 2020-06-18: qty 20

## 2020-06-18 MED ORDER — HYDROMORPHONE HCL 1 MG/ML IJ SOLN
1.0000 mg | INTRAMUSCULAR | Status: DC | PRN
  Administered 2020-06-18: 1 mg via INTRAVENOUS

## 2020-06-18 MED ORDER — ACETAMINOPHEN 10 MG/ML IV SOLN
1000.0000 mg | Freq: Four times a day (QID) | INTRAVENOUS | Status: AC
  Administered 2020-06-18 – 2020-06-19 (×4): 1000 mg via INTRAVENOUS
  Filled 2020-06-18 (×4): qty 100

## 2020-06-18 MED ORDER — HYDROMORPHONE HCL 1 MG/ML IJ SOLN
INTRAMUSCULAR | Status: AC
  Filled 2020-06-18: qty 1

## 2020-06-18 MED ORDER — FENTANYL CITRATE (PF) 100 MCG/2ML IJ SOLN
25.0000 ug | INTRAMUSCULAR | Status: DC | PRN
  Administered 2020-06-18 (×3): 50 ug via INTRAVENOUS

## 2020-06-18 MED ORDER — DIPHENHYDRAMINE HCL 50 MG/ML IJ SOLN: 12.5000 mg | Freq: Four times a day (QID) | INTRAMUSCULAR | Status: DC | PRN

## 2020-06-18 MED ORDER — PHENYLEPHRINE 40 MCG/ML (10ML) SYRINGE FOR IV PUSH (FOR BLOOD PRESSURE SUPPORT)
PREFILLED_SYRINGE | INTRAVENOUS | Status: DC | PRN
  Administered 2020-06-18: 80 ug via INTRAVENOUS
  Administered 2020-06-18: 120 ug via INTRAVENOUS
  Administered 2020-06-18: 80 ug via INTRAVENOUS
  Administered 2020-06-18: 120 ug via INTRAVENOUS
  Administered 2020-06-18: 80 ug via INTRAVENOUS

## 2020-06-18 MED ORDER — VASOPRESSIN 20 UNIT/ML IV SOLN
INTRAVENOUS | Status: AC
  Filled 2020-06-18: qty 1

## 2020-06-18 MED ORDER — VASOPRESSIN 20 UNIT/ML IV SOLN
INTRAVENOUS | Status: DC | PRN
  Administered 2020-06-18: 2 [IU] via INTRAVENOUS

## 2020-06-18 MED ORDER — AMISULPRIDE (ANTIEMETIC) 5 MG/2ML IV SOLN: 10.0000 mg | Freq: Once | INTRAVENOUS | Status: DC | PRN

## 2020-06-18 MED ORDER — PROPOFOL 10 MG/ML IV BOLUS
INTRAVENOUS | Status: DC | PRN
  Administered 2020-06-18: 170 mg via INTRAVENOUS

## 2020-06-18 MED ORDER — PHENYLEPHRINE HCL-NACL 10-0.9 MG/250ML-% IV SOLN
INTRAVENOUS | Status: DC | PRN
  Administered 2020-06-18: 30 ug/min via INTRAVENOUS

## 2020-06-18 MED ORDER — CHLORHEXIDINE GLUCONATE 0.12 % MT SOLN
OROMUCOSAL | Status: AC
  Administered 2020-06-18: 15 mL
  Filled 2020-06-18: qty 15

## 2020-06-18 MED ORDER — DILTIAZEM LOAD VIA INFUSION
10.0000 mg | Freq: Once | INTRAVENOUS | Status: AC
  Administered 2020-06-18: 10 mg via INTRAVENOUS
  Filled 2020-06-18: qty 10

## 2020-06-18 MED ORDER — ALBUTEROL SULFATE HFA 108 (90 BASE) MCG/ACT IN AERS
INHALATION_SPRAY | RESPIRATORY_TRACT | Status: AC
  Filled 2020-06-18: qty 6.7

## 2020-06-18 MED ORDER — DIGOXIN 0.25 MG/ML IJ SOLN
0.2500 mg | Freq: Once | INTRAMUSCULAR | Status: AC
  Administered 2020-06-18: 0.25 mg via INTRAVENOUS

## 2020-06-18 MED ORDER — SUGAMMADEX SODIUM 200 MG/2ML IV SOLN
INTRAVENOUS | Status: DC | PRN
  Administered 2020-06-18 (×3): 100 mg via INTRAVENOUS

## 2020-06-18 MED ORDER — GLUCERNA SHAKE PO LIQD: 237.0000 mL | Freq: Two times a day (BID) | ORAL | Status: DC

## 2020-06-18 MED ORDER — ACETAMINOPHEN 500 MG PO TABS
1000.0000 mg | ORAL_TABLET | Freq: Once | ORAL | Status: AC
  Administered 2020-06-18: 1000 mg via ORAL
  Filled 2020-06-18: qty 2

## 2020-06-18 MED ORDER — INSULIN ASPART 100 UNIT/ML ~~LOC~~ SOLN
0.0000 [IU] | Freq: Three times a day (TID) | SUBCUTANEOUS | Status: DC
  Administered 2020-06-18: 2 [IU] via SUBCUTANEOUS
  Administered 2020-06-19: 1 [IU] via SUBCUTANEOUS

## 2020-06-18 MED ORDER — METOPROLOL TARTRATE 5 MG/5ML IV SOLN
INTRAVENOUS | Status: DC | PRN
  Administered 2020-06-18 (×2): 2.5 mg via INTRAVENOUS

## 2020-06-18 MED ORDER — ROCURONIUM BROMIDE 10 MG/ML (PF) SYRINGE
PREFILLED_SYRINGE | INTRAVENOUS | Status: AC
  Filled 2020-06-18: qty 10

## 2020-06-18 MED ORDER — LIDOCAINE 2% (20 MG/ML) 5 ML SYRINGE
INTRAMUSCULAR | Status: DC | PRN
  Administered 2020-06-18: 100 mg via INTRAVENOUS

## 2020-06-18 MED ORDER — LIP MEDEX EX OINT
1.0000 "application " | TOPICAL_OINTMENT | Freq: Two times a day (BID) | CUTANEOUS | Status: DC
  Administered 2020-06-18 – 2020-06-27 (×18): 1 via TOPICAL
  Filled 2020-06-18: qty 7

## 2020-06-18 MED ORDER — DEXAMETHASONE SODIUM PHOSPHATE 10 MG/ML IJ SOLN
INTRAMUSCULAR | Status: DC | PRN
  Administered 2020-06-18: 5 mg via INTRAVENOUS

## 2020-06-18 MED ORDER — PHENYLEPHRINE 40 MCG/ML (10ML) SYRINGE FOR IV PUSH (FOR BLOOD PRESSURE SUPPORT)
PREFILLED_SYRINGE | INTRAVENOUS | Status: AC
  Filled 2020-06-18: qty 10

## 2020-06-18 MED ORDER — ONDANSETRON HCL 4 MG/2ML IJ SOLN
INTRAMUSCULAR | Status: AC
  Filled 2020-06-18: qty 2

## 2020-06-18 MED ORDER — PIPERACILLIN-TAZOBACTAM 3.375 G IVPB: 3.3750 g | Freq: Three times a day (TID) | INTRAVENOUS | Status: DC

## 2020-06-18 MED ORDER — METOPROLOL TARTRATE 5 MG/5ML IV SOLN
INTRAVENOUS | Status: AC
  Filled 2020-06-18: qty 5

## 2020-06-18 MED ORDER — LACTATED RINGERS IV BOLUS: 1000.0000 mL | Freq: Three times a day (TID) | INTRAVENOUS | Status: AC | PRN

## 2020-06-18 MED ORDER — DIGOXIN 0.25 MG/ML IJ SOLN
INTRAMUSCULAR | Status: AC
  Filled 2020-06-18: qty 2

## 2020-06-18 MED ORDER — MIDAZOLAM HCL 2 MG/2ML IJ SOLN
INTRAMUSCULAR | Status: AC
  Filled 2020-06-18: qty 2

## 2020-06-18 MED ORDER — SCOPOLAMINE 1 MG/3DAYS TD PT72
1.0000 | MEDICATED_PATCH | TRANSDERMAL | Status: DC
  Administered 2020-06-18: 1.5 mg via TRANSDERMAL
  Filled 2020-06-18: qty 1

## 2020-06-18 MED ORDER — CALCIUM POLYCARBOPHIL 625 MG PO TABS
625.0000 mg | ORAL_TABLET | Freq: Two times a day (BID) | ORAL | Status: DC
  Administered 2020-06-18 – 2020-06-28 (×20): 625 mg via ORAL
  Filled 2020-06-18 (×20): qty 1

## 2020-06-18 MED ORDER — SODIUM CHLORIDE 0.9 % IR SOLN
Status: DC | PRN
  Administered 2020-06-18: 3000 mL

## 2020-06-18 MED ORDER — DILTIAZEM HCL-DEXTROSE 125-5 MG/125ML-% IV SOLN (PREMIX)
5.0000 mg/h | INTRAVENOUS | Status: DC
  Administered 2020-06-18 – 2020-06-24 (×7): 5 mg/h via INTRAVENOUS
  Filled 2020-06-18 (×9): qty 125

## 2020-06-18 MED ORDER — LIDOCAINE 2% (20 MG/ML) 5 ML SYRINGE
INTRAMUSCULAR | Status: AC
  Filled 2020-06-18: qty 5

## 2020-06-18 MED ORDER — ROCURONIUM BROMIDE 10 MG/ML (PF) SYRINGE
PREFILLED_SYRINGE | INTRAVENOUS | Status: DC | PRN
  Administered 2020-06-18: 50 mg via INTRAVENOUS
  Administered 2020-06-18: 100 mg via INTRAVENOUS
  Administered 2020-06-18: 50 mg via INTRAVENOUS

## 2020-06-18 MED ORDER — TRACE MINERALS CU-MN-SE-ZN 300-55-60-3000 MCG/ML IV SOLN
INTRAVENOUS | Status: AC
  Filled 2020-06-18: qty 727.6

## 2020-06-18 MED ORDER — ESMOLOL HCL 100 MG/10ML IV SOLN
INTRAVENOUS | Status: DC | PRN
  Administered 2020-06-18: 50 mg via INTRAVENOUS
  Administered 2020-06-18 (×2): 30 mg via INTRAVENOUS
  Administered 2020-06-18 (×2): 20 mg via INTRAVENOUS

## 2020-06-18 MED ORDER — ONDANSETRON HCL 4 MG/2ML IJ SOLN
INTRAMUSCULAR | Status: DC | PRN
  Administered 2020-06-18: 4 mg via INTRAVENOUS

## 2020-06-18 MED ORDER — PROCHLORPERAZINE EDISYLATE 10 MG/2ML IJ SOLN
5.0000 mg | INTRAMUSCULAR | Status: DC | PRN
  Administered 2020-06-20 – 2020-06-28 (×13): 10 mg via INTRAVENOUS
  Filled 2020-06-18 (×15): qty 2

## 2020-06-18 SURGICAL SUPPLY — 84 items
BAG URINE DRAIN 2000ML AR STRL (UROLOGICAL SUPPLIES) ×3 IMPLANT
BAG URO CATCHER STRL LF (MISCELLANEOUS) ×3 IMPLANT
BIOPATCH RED 1 DISK 7.0 (GAUZE/BANDAGES/DRESSINGS) ×3 IMPLANT
BNDG GAUZE ELAST 4 BULKY (GAUZE/BANDAGES/DRESSINGS) ×3 IMPLANT
CANISTER SUCT 3000ML PPV (MISCELLANEOUS) ×3 IMPLANT
CATH FOLEY 2WAY SLVR  5CC 16FR (CATHETERS) ×3
CATH FOLEY 2WAY SLVR 5CC 16FR (CATHETERS) ×2 IMPLANT
CATH INTERMIT  6FR 70CM (CATHETERS) ×3 IMPLANT
CHLORAPREP W/TINT 26 (MISCELLANEOUS) ×3 IMPLANT
COVER SURGICAL LIGHT HANDLE (MISCELLANEOUS) ×3 IMPLANT
COVER WAND RF STERILE (DRAPES) ×3 IMPLANT
DRAIN CHANNEL 19F RND (DRAIN) ×3 IMPLANT
DRAPE LAPAROSCOPIC ABDOMINAL (DRAPES) ×3 IMPLANT
DRAPE WARM FLUID 44X44 (DRAPES) ×3 IMPLANT
DRSG OPSITE POSTOP 4X10 (GAUZE/BANDAGES/DRESSINGS) IMPLANT
DRSG OPSITE POSTOP 4X8 (GAUZE/BANDAGES/DRESSINGS) IMPLANT
DRSG PAD ABDOMINAL 8X10 ST (GAUZE/BANDAGES/DRESSINGS) ×3 IMPLANT
DRSG TEGADERM 2-3/8X2-3/4 SM (GAUZE/BANDAGES/DRESSINGS) ×3 IMPLANT
DRSG TEGADERM 4X4.75 (GAUZE/BANDAGES/DRESSINGS) ×3 IMPLANT
ELECT BLADE 6.5 EXT (BLADE) ×3 IMPLANT
ELECT CAUTERY BLADE 6.4 (BLADE) ×3 IMPLANT
ELECT REM PT RETURN 9FT ADLT (ELECTROSURGICAL) ×3
ELECTRODE REM PT RTRN 9FT ADLT (ELECTROSURGICAL) ×2 IMPLANT
EVACUATOR SILICONE 100CC (DRAIN) ×3 IMPLANT
GAUZE SPONGE 4X4 12PLY STRL (GAUZE/BANDAGES/DRESSINGS) ×3 IMPLANT
GLOVE BIO SURGEON STRL SZ7 (GLOVE) ×3 IMPLANT
GLOVE BIOGEL M 6.5 STRL (GLOVE) ×3 IMPLANT
GLOVE BIOGEL M STRL SZ7.5 (GLOVE) ×3 IMPLANT
GLOVE BIOGEL PI IND STRL 6 (GLOVE) ×2 IMPLANT
GLOVE BIOGEL PI IND STRL 7.5 (GLOVE) ×2 IMPLANT
GLOVE BIOGEL PI INDICATOR 6 (GLOVE) ×1
GLOVE BIOGEL PI INDICATOR 7.5 (GLOVE) ×1
GLOVE ECLIPSE 7.5 STRL STRAW (GLOVE) ×3 IMPLANT
GLOVE ECLIPSE 8.0 STRL XLNG CF (GLOVE) ×3 IMPLANT
GLOVE SRG 8 PF TXTR STRL LF DI (GLOVE) ×4 IMPLANT
GLOVE SURG UNDER POLY LF SZ8 (GLOVE) ×6
GOWN SRG XL LVL 4 BRTHBL STRL (GOWNS) ×2 IMPLANT
GOWN STRL NON-REIN XL LVL4 (GOWNS) ×3
GOWN STRL REUS W/ TWL LRG LVL3 (GOWN DISPOSABLE) ×4 IMPLANT
GOWN STRL REUS W/TWL LRG LVL3 (GOWN DISPOSABLE) ×6
GUIDEWIRE ANG ZIPWIRE 038X150 (WIRE) ×3 IMPLANT
GUIDEWIRE STR DUAL SENSOR (WIRE) IMPLANT
HANDLE SUCTION POOLE (INSTRUMENTS) ×2 IMPLANT
IV NS 1000ML (IV SOLUTION) ×3
IV NS 1000ML BAXH (IV SOLUTION) ×2 IMPLANT
IV NS IRRIG 3000ML ARTHROMATIC (IV SOLUTION) ×3 IMPLANT
KIT BASIN OR (CUSTOM PROCEDURE TRAY) ×3 IMPLANT
KIT OSTOMY DRAINABLE 2.75 STR (WOUND CARE) ×3 IMPLANT
KIT TURNOVER KIT B (KITS) ×6 IMPLANT
LIGASURE IMPACT 36 18CM CVD LR (INSTRUMENTS) ×3 IMPLANT
MANIFOLD NEPTUNE II (INSTRUMENTS) ×3 IMPLANT
NS IRRIG 1000ML POUR BTL (IV SOLUTION) ×6 IMPLANT
PACK CYSTO (CUSTOM PROCEDURE TRAY) ×3 IMPLANT
PACK GENERAL/GYN (CUSTOM PROCEDURE TRAY) ×3 IMPLANT
PAD ARMBOARD 7.5X6 YLW CONV (MISCELLANEOUS) ×3 IMPLANT
PENCIL SMOKE EVACUATOR (MISCELLANEOUS) ×3 IMPLANT
RELOAD PROXIMATE 75MM BLUE (ENDOMECHANICALS) ×9 IMPLANT
SPECIMEN JAR LARGE (MISCELLANEOUS) IMPLANT
SPONGE LAP 18X18 RF (DISPOSABLE) ×6 IMPLANT
STAPLER CUT CVD 40MM GREEN (STAPLE) ×3 IMPLANT
STAPLER GUN LINEAR PROX 60 (STAPLE) ×3 IMPLANT
STAPLER PROXIMATE 75MM BLUE (STAPLE) ×3 IMPLANT
STAPLER VISISTAT 35W (STAPLE) ×3 IMPLANT
SUCTION POOLE HANDLE (INSTRUMENTS) ×3
SUT ETHILON 2 0 FS 18 (SUTURE) ×3 IMPLANT
SUT NOVA NAB DX-16 0-1 5-0 T12 (SUTURE) ×3 IMPLANT
SUT PDS AB 1 TP1 96 (SUTURE) ×6 IMPLANT
SUT PROLENE 2 0 CT2 30 (SUTURE) ×6 IMPLANT
SUT SILK 2 0 (SUTURE) ×6
SUT SILK 2 0 SH CR/8 (SUTURE) ×3 IMPLANT
SUT SILK 2 0 TIES 10X30 (SUTURE) ×3 IMPLANT
SUT SILK 2-0 18XBRD TIE 12 (SUTURE) ×4 IMPLANT
SUT SILK 3 0 (SUTURE) ×3
SUT SILK 3 0 SH CR/8 (SUTURE) ×3 IMPLANT
SUT SILK 3-0 18XBRD TIE 12 (SUTURE) ×2 IMPLANT
SUT VIC AB 3-0 SH 27 (SUTURE)
SUT VIC AB 3-0 SH 27X BRD (SUTURE) IMPLANT
SUT VIC AB 3-0 SH 8-18 (SUTURE) ×12 IMPLANT
SYPHON OMNI JUG (MISCELLANEOUS) ×3 IMPLANT
TOWEL GREEN STERILE (TOWEL DISPOSABLE) ×6 IMPLANT
TOWEL GREEN STERILE FF (TOWEL DISPOSABLE) ×3 IMPLANT
TRAY FOLEY MTR SLVR 16FR STAT (SET/KITS/TRAYS/PACK) ×3 IMPLANT
TUBE CONNECTING 12X1/4 (SUCTIONS) ×3 IMPLANT
YANKAUER SUCT BULB TIP NO VENT (SUCTIONS) IMPLANT

## 2020-06-18 NOTE — Progress Notes (Signed)
   06/18/20 0750  Vitals  BP 106/77  BP Location Left Arm  BP Method Automatic  Patient Position (if appropriate) Lying  Pulse Rate 77   Did not admin cardizem PO due to above VS and headed to preop. Pre-op RN notified.

## 2020-06-18 NOTE — Op Note (Addendum)
Preoperative diagnosis: diverticulitis Postoperative diagnosis: Same as above Procedure: 1.  Exploratory laparotomy 2.  Left colectomy 3.  End colostomy 4. Small bowel resection Surgeon: Dr Serita Grammes    Asst: Dr Neysa Bonito EBL: 150 cc Anesthesia general Complications none Drains 77 Fr Blake drain to pelvis Specimens Left colon stitch proximal Sponge and needle count correct dispo ICU  Indications: 60 year old female with atrial fibrillation, hypertension, tobacco abuse, chronic anticoagulation on Eliquis presents with worsening lower abdominal pain.  She was seen 8 days ago with similar complaints.  CT scan showed acute sigmoid diverticulitis and the patient was discharged on Augmentin.  she has recurred and is not improving on conservative therapy at this point. I discussed proceeding with a left colectomy and colostomy.    Procedure: After informed consent was obtained from the patient he was taken to the operating room.  She was given antibiotics.  SCDs were in place.  A Foley catheter was placed.  A nasogastric tube was also placed.  Se was first placed under general anesthesia and then prepped and draped in the standard sterile surgical fashion.  Surgical timeout was then performed.  Urology had already placed stents prior to me beginning.  I made a midline incision.  I entered into the abdomen superiorly.  In the inferior aspect where she had had a prior surgery there was a fair amount of adhesions to the abdominal wall.  I took these down with Metzenbaum scissors.  The small bowel was healthy.  I did place a couple 2-0 silk Lembert sutures.  I then placed the Bookwalter retractor.  The small bowel at this point was mostly free.  I was able to look in the left lower quadrant.  I palpated the stents.  I then with some difficulty was able to incise the white line of Toldt and began rolling the colon medially in the left colon.  As I went down inferiorly there was a lot of scar  tissue that was very hard.  I did identify the stents and had them palpated the entire case.  With some difficulty I was then able to begin dividing the scar tissue and the combination of blunt dissection as well as cautery I was able to release the colon from the left lateral sidewall.  The uterus was adherent to the colon I was able to finger fracture this off.  The small intestine was also adherent to the diverticular disease.  There was no real plane to do this so I finger fracture this.  This portion of the small bowel ended up needing to be resected later.  Eventually I was able to start getting into the pelvis.  I took down the posterior attachments of the sigmoid colon down to where I got to the rectum.  I then was able to finger fracture this and pull the sigmoid colon up.  The rectum was healthy below that.  We will have to wait for the pathology as I am not entirely convinced this is not a tumor.  I then used a contour stapler to divide the rectum.  At the GIA stapler was used to divide the sigmoid colon proximally.  This was passed off the table with a stitch proximal.  2-0 Prolene sutures were placed on the rectal stump.  I then irrigated copiously.  An 60 [place] Blake drain was placed in the pelvis.  I ran the small bowel.  There was an area that I was concerned about with a long  serosal tear.  I elected to remove this portion of small bowel.  I used a GIA stapler to divide both ends.  I then approximated the pieces of bowel with 3-0 silk suture.  I made enterotomies in both and use a GIA stapler to create a common enterotomy.  I closed this with a TX stapler.  I placed 2 apex sutures of 3-0 silk.  This was patent and viable.  I closed the mesenteric defect with 2-0 silk suture.  I then released the left colon up to the splenic flexure.  She had been marked previously for a stoma.  This appeared to reach without difficulty.  I then made a hole in the skin and cord this out to the fascia.  This was done  at the site of one of the marks.  I then made a cruciate incision in the fascia spread the muscle and divided the peritoneum.  This was dilated and the colon was brought through this.  The omentum was then placed over the viscera.  This was then closed with #1 looped PDS this was left open and a wet-to-dry dressing placed.  The ostomy was then matured with 3-0 Vicryl suture.  An appliance was placed.  During the procedure she was having a fair amount of difficulty with oxygenation likely related to her Covid status. She was extubated and transferred to pacu stable

## 2020-06-18 NOTE — Progress Notes (Signed)
PROGRESS NOTE    Tammy Davies  IEP:[id number] DOB: [birth year] DOA: 06/05/2020 PCP: Patient, No Pcp Per   Brief Narrative:   60 year old with history of paroxysmal A. fib on anticoagulation, tobacco use, obstructive sleep apnea on CPAP, HTN admitted for abdominal pain which has been ongoing for the past month which previously was treated as constipation and then uncomplicated diverticulitis about a week ago with Augmentin.  She presents with worsening of her symptoms.  CT abdomen pelvis showed sigmoid diverticulitis with small abscess.,  Consulted and started on Zosyn. Repeat CT showed persistent diverticulitis with absces     Subjective:  Reported abdominal pain is controlled.  She denies any nausea   Assessment & Plan:   Principal Problem:   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 06/19/2020 Active Problems:   Hyperglycemia   Hypertension   Obesity (BMI 30-39.9)   PAF (paroxysmal atrial fibrillation) ([facility])   History of COVID-19   Chronic anticoagulation   Colostomy in place ([place])   Hypoxia   Protein-calorie malnutrition, moderate ([facility])   Status post cystoscopy with ureteral stent placement   Hematuria due to cystitis from perforated diverticulitis  Acute complicated diverticulitis with small abscess -Patient treated initially with IV Zosyn, then IV Invanz. -It appears she failed conservative management, as most recent repeat CT abdomen pelvis showing evidence of obstruction, and forming strictures. -He is currently n.p.o., on TPN. -Management per surgery. -Patient went for exploratory laparotomy with left colectomy, end colostomy and small bowel resection by Dr. Donne Hazel 06/18/2020.  COVID-19 infection, mainly asymptomatic - Asymptomatic.  Supportive care.  Chest x-ray 1/25 -negative.   -she is currently off her isolation.  As of 1/31,    history of paroxysmal atrial fibrillation - On Cardizem, heart rate is uncontrolled.  As she did not  receive her p.o. Cardizem, she is currently on Cardizem drip. -On Eliquis at home, was kept on heparin GTT during hospital stay in anticipation of her surgery. -We will continue to hold heparin GTT due to increased risk of bleeding postop, will reassess tomorrow after H&H.   Essential hypertension -continue  Cardizem   DVT prophylaxis: Heparin drip Code Status: Full code Family Communication: Patient is awake, alert and appropriate, I have discussed her case in details with her, and answered all her questions.  Status is: Inpatient  Remains inpatient appropriate because:Inpatient level of care appropriate due to severity of illness   Dispo: The patient is from: Home              Anticipated d/c is to: Home              Anticipated d/c date is: 3 days              Patient currently is not medically stable to d/c.    Body mass index is 36.2 kg/m.   Examination:  Awake Alert, Oriented X 3, No new F.N deficits, Normal affect Symmetrical Chest wall movement, Good air movement bilaterally, CTAB RRR,No Gallops,Rubs or new Murmurs, No Parasternal Heave Abdominal exam deferred given she is postop, midline surgical wound bandaged, she had left colostomy with no bleed, she had right JP drain No Cyanosis, Clubbing or edema, No new Rash or bruise        Objective: Vitals:   06/18/20 1430 06/18/20 1500 06/18/20 1530 06/18/20 1624  BP: 140/76 132/79 131/81 131/87  Pulse: 74 74 74 75  Resp: 14 14 16 15   Temp:  98 F (36.7 C)    TempSrc:  SpO2: 96% 95% 95% 95%  Weight:      Height:        Intake/Output Summary (Last 24 hours) at 06/18/2020 1653 Last data filed at 06/18/2020 1538 Gross per 24 hour  Intake 2787.26 ml  Output 1645 ml  Net 1142.26 ml   Filed Weights   06/15/20 0430 06/16/20 0404 06/18/20 0814  Weight: 110.5 kg 108 kg 108 kg     Data Reviewed:   CBC: Recent Labs  Lab 06/15/20 0515 06/16/20 0406 06/17/20 0400 06/18/20 0332 06/18/20 1102  06/18/20 1319  WBC 6.5 5.5 5.6 4.5  --  13.0*  NEUTROABS 5.2  --  3.8  --   --   --   HGB 11.0* 10.2* 10.2* 9.9* 9.5* 10.0*  HCT 36.2 32.6* 33.9* 31.7* 28.0* 32.6*  MCV 86.4 86.0 87.4 86.6  --  86.9  PLT 285 206 170 120*  --  329*   Basic Metabolic Panel: Recent Labs  Lab 06/12/20 0244 06/12/20 1944 06/15/20 0515 06/16/20 0406 06/17/20 0400 06/18/20 0332 06/18/20 1102 06/18/20 1319  NA 137   < > 136 136 135 135 138 133*  K 3.9   < > 4.0 4.0 3.5 4.1 4.0 4.1  CL 104   < > 100 101 99 100  --  101  CO2 25   < > 27 24 29 28   --  23  GLUCOSE 92   < > 114* 107* 114* 101*  --  217*  BUN 7   < > 5* 6 5* 7  --  9  CREATININE 0.81   < > 0.90 0.85 0.78 0.76  --  0.82  CALCIUM 9.2   < > 9.4 8.7* 8.8* 8.6*  --  8.3*  MG 1.9  --   --   --  1.8 2.0  --   --   PHOS  --   --   --   --  3.9 4.3  --   --    < > = values in this interval not displayed.   GFR: Estimated Creatinine Clearance: 95 mL/min (by C-G formula based on SCr of 0.82 mg/dL). Liver Function Tests: Recent Labs  Lab 06/15/20 0515 06/16/20 0406 06/17/20 0400  AST 17 20 15   ALT 24 22 19   ALKPHOS 110 95 87  BILITOT 0.6 0.6 0.7  PROT 6.3* 5.7* 5.6*  ALBUMIN 3.0* 2.8* 2.8*   Recent Labs  Lab 06/16/20 0406  LIPASE 19   No results for input(s): AMMONIA in the last 168 hours. Coagulation Profile: No results for input(s): INR, PROTIME in the last 168 hours. Cardiac Enzymes: No results for input(s): CKTOTAL, CKMB, CKMBINDEX, TROPONINI in the last 168 hours. BNP (last 3 results) No results for input(s): PROBNP in the last 8760 hours. HbA1C: No results for input(s): HGBA1C in the last 72 hours. CBG: Recent Labs  Lab 06/17/20 1614 06/17/20 2010 06/17/20 2357 06/18/20 0441 06/18/20 0740  GLUCAP 107* 112* 96 102* 100*   Lipid Profile: Recent Labs    06/17/20 0400  TRIG 107   Thyroid Function Tests: No results for input(s): TSH, T4TOTAL, FREET4, T3FREE, THYROIDAB in the last 72 hours. Anemia Panel: No  results for input(s): VITAMINB12, FOLATE, FERRITIN, TIBC, IRON, RETICCTPCT in the last 72 hours. Sepsis Labs: No results for input(s): PROCALCITON, LATICACIDVEN in the last 168 hours.  No results found for this or any previous visit (from the past 240 hour(s)).       Radiology Studies: DG Retrograde Pyelogram  Result Date: 06/18/2020 CLINICAL DATA:  Bilateral stent placement with retrograde pyelogram. EXAM: INTRAOPERATIVE BILATERAL RETROGRADE UROGRAPHY TECHNIQUE: Images were obtained with the C-arm fluoroscopic device intraoperatively and submitted for interpretation post-operatively. Please see the procedural report for the amount of contrast and the fluoroscopy time utilized. COMPARISON:  CT abdomen pelvis 06/15/2020 FINDINGS: Five intraoperative fluoroscopic images are submitted for interpretation. There is mild irregular narrowing of the distal right ureter. Rounded filling defect in the distal left ureter most likely air bubble or debris. Bilateral ureteral stents are noted. IMPRESSION: Intraoperative fluoroscopic images of retrograde urography as above. Electronically Signed   By: Miachel Roux M.D.   On: 06/18/2020 11:10        Scheduled Meds: . Chlorhexidine Gluconate Cloth  6 each Topical Daily  . digoxin      . [START ON 06/19/2020] feeding supplement (GLUCERNA SHAKE)  237 mL Oral BID BM  . fentaNYL      . fentaNYL      . HYDROmorphone      . insulin aspart  0-9 Units Subcutaneous Q8H  . lip balm  1 application Topical BID  . polycarbophil  625 mg Oral BID   Continuous Infusions: . acetaminophen    . acetaminophen 1,000 mg (06/18/20 1339)  . diltiazem (CARDIZEM) infusion 5 mg/hr (06/18/20 1357)  . lactated ringers    . methocarbamol (ROBAXIN) IV    . piperacillin-tazobactam (ZOSYN)  IV    . TPN ADULT (ION) 65 mL/hr at 06/17/20 1803  . TPN ADULT (ION)       LOS: 12 days      Phillips Climes, MD [facility]  If 7PM-7AM, please contact  night-coverage  06/18/2020, 4:53 PM

## 2020-06-18 NOTE — Brief Op Note (Signed)
06/05/2020 - 06/18/2020  10:06 AM  PATIENT:  [name]  60 y.o. female  PRE-OPERATIVE DIAGNOSIS:  diverticulitis  POST-OPERATIVE DIAGNOSIS:  * No post-op diagnosis entered *  PROCEDURE:  Cystoscopy with BILATERAL retrograde pyelograms and ureteral stent placement  SURGEON:      Alexis Frock, MD - Primary  PHYSICIAN ASSISTANT:   ASSISTANTS: none   ANESTHESIA:   general  EBL:  minimal   BLOOD ADMINISTERED:none  DRAINS: Rt (green) + Lt (yellow ) + foley go gravity drain via Lyda Kalata adapter   LOCAL MEDICATIONS USED:  NONE  SPECIMEN:  No Specimen  DISPOSITION OF SPECIMEN:  N/A  COUNTS:  YES  TOURNIQUET:  * No tourniquets in log *  DICTATION: .Other Dictation: Dictation Number [id number]  PLAN OF CARE: remain in OR for colon surgery  PATIENT DISPOSITION:  remain in OR   Delay start of Pharmacological VTE agent (>24hrs) due to surgical blood loss or risk of bleeding: not applicable

## 2020-06-18 NOTE — Progress Notes (Signed)
Day of Surgery   Subjective/Chief Complaint: llq pain persists, having some bowel function   Objective: Vital signs in last 24 hours: Temp:  [97.5 F (36.4 C)-98.5 F (36.9 C)] 97.9 F (36.6 C) (02/01 0814) Pulse Rate:  [75-78] 76 (02/01 0814) Resp:  [12-20] 16 (02/01 0814) BP: (105-113)/(67-93) 113/81 (02/01 0814) SpO2:  [93 %-98 %] 98 % (02/01 0814) Weight:  [585 kg] 108 kg (02/01 0814) Last BM Date: 06/17/20  Intake/Output from previous day: 01/31 0701 - 02/01 0700 In: 1147.3 [P.O.:450; I.V.:697.3] Out: -  Intake/Output this shift: Total I/O In: 30 [P.O.:30] Out: -   Abd: Soft, mild distension, LLQ tenderness without peritonitis, +BS, prior hysterectomy and lap chole scars are well healed  Lab Results:  Recent Labs    06/17/20 0400 06/18/20 0332  WBC 5.6 4.5  HGB 10.2* 9.9*  HCT 33.9* 31.7*  PLT 170 120*   BMET Recent Labs    06/17/20 0400 06/18/20 0332  NA 135 135  K 3.5 4.1  CL 99 100  CO2 29 28  GLUCOSE 114* 101*  BUN 5* 7  CREATININE 0.78 0.76  CALCIUM 8.8* 8.6*   PT/INR No results for input(s): LABPROT, INR in the last 72 hours. ABG No results for input(s): PHART, HCO3 in the last 72 hours.  Invalid input(s): PCO2, PO2  Studies/Results: DG CHEST PORT 1 VIEW  Result Date: 06/16/2020 CLINICAL DATA:  PICC line placement EXAM: PORTABLE CHEST 1 VIEW COMPARISON:  06/11/2020 FINDINGS: Normal cardiac silhouette. PICC line pace with tip in distal SVC. Patchy airspace markings. No pneumothorax. No focal consolidation. IMPRESSION: RIGHT PICC line placed without complication. Patchy airspace opacities in lower lobes. Electronically Signed   By: Suzy Bouchard M.D.   On: 06/16/2020 14:16   [place] EKG SITE RITE  Result Date: 06/16/2020 If Site Rite image not attached, placement could not be confirmed due to current cardiac rhythm.  [place] EKG SITE RITE  Result Date: 06/16/2020 If Site Rite image not attached, placement could not be confirmed due to current  cardiac rhythm.   Anti-infectives: Anti-infectives (From admission, onward)   Start     Dose/Rate Route Frequency Ordered Stop   06/14/20 0000  ciprofloxacin (CIPRO) 500 MG tablet        500 mg Oral 2 times daily 06/14/20 1100 06/24/20 2359   06/14/20 0000  metroNIDAZOLE (FLAGYL) 500 MG tablet        500 mg Oral 3 times daily 06/14/20 1100 06/24/20 2359   06/11/20 1230  [MAR Hold]  ertapenem (INVANZ) 1,000 mg in sodium chloride 0.9 % 100 mL IVPB        (MAR Hold since Tue 06/18/2020 at [place].Hold Reason: Transfer to a Procedural area.)   1 g 200 mL/hr over 30 Minutes Intravenous Every 24 hours 06/11/20 1114     06/06/20 1850  piperacillin-tazobactam (ZOSYN) IVPB 3.375 g  Status:  Discontinued        3.375 g 12.5 mL/hr over 240 Minutes Intravenous Every 8 hours 06/06/20 1850 06/11/20 1114   06/06/20 1830  piperacillin-tazobactam (ZOSYN) IVPB 3.375 g        3.375 g 100 mL/hr over 30 Minutes Intravenous  Once 06/06/20 1818 06/06/20 2039      Assessment/Plan:  Acute sigmoid diverticulitis with small phlegmon/ abscess in distal sigmoid colon - Initially dx w/ diverticulitis on 1/12 and d/c with Augmentin. Failed outpatient therapy. CT 1/20 showed sigmoid diverticulitis w/ small phlegmon vs abscess just inferior to distal sigmoid colon measuring3.4 x 3.3  x 2.7 cm. Our team felt this was to small for IR drainage. - repeat CT 1/24 shows overall no significant improvement in the inflammatory changes of the sigmoid colon; small amount of partially loculated fluid or phlegmon to the left of the sigmoid colonwith no discrete drainable fluid collection;2.2 x 1.8 cm ill-defined inflammatory area inferior to the sigmoid colon; no extraluminal air, no bowel obstruction - repeat CT 1/29 w/ Worsening sigmoid diverticulitis, still without evidence for perforation or abscess formation. There now appears to be some degree of obstruction as evidence by dilatation of the upstream colon now with associated  air-fluid levels. - Given patients ongoing symptoms, with worsening CT scan findings now concerning for obstruction vs stricture, will plan for OR today as discussed yesterday. Urology to place stents.   Rolm Bookbinder 06/18/2020

## 2020-06-18 NOTE — Progress Notes (Signed)
Subjective/Chief Complaint:  1 - Severe Diverticulitis, Request for Peri-Op Ureteral Stenting - PT undergoing colon resection for presumed diverticulitis today by gen surgery team who has requested peri-op stenting. No GU histroy. CT 1/29 confirms bilateral single ureters, no hydro.  Today "Tammy Davies" is seen to proceed with cysto and bilateral ureteral stent placement as part of extirpative surgery for diverticulitis. Afebrile, NPO. Cr <1. Hgb 9.9.   Objective: Vital signs in last 24 hours: Temp:  [97.5 F (36.4 C)-98.5 F (36.9 C)] 97.8 F (36.6 C) (02/01 0442) Pulse Rate:  [75-78] 77 (02/01 0442) Resp:  [12-20] 15 (02/01 0442) BP: (105-110)/(67-93) 108/93 (02/01 0442) SpO2:  [93 %-96 %] 96 % (02/01 0442) Last BM Date: 06/17/20  Intake/Output from previous day: 01/31 0701 - 02/01 0700 In: 1147.3 [P.O.:450; I.V.:697.3] Out: -  Intake/Output this shift: No intake/output data recorded.  General appearance: alert and cooperative Eyes: negative Nose: Nares normal. Septum midline. Mucosa normal. No drainage or sinus tenderness. Throat: lips, mucosa, and tongue normal; teeth and gums normal Neck: supple, symmetrical, trachea midline Back: symmetric, no curvature. ROM normal. No CVA tenderness. Resp: non-labored on room air.  Cardio: Nl rate GI: obese, no CVAT Extremities: extremities normal, atraumatic, no cyanosis or edema Lymph nodes: Cervical, supraclavicular, and axillary nodes normal. Neurologic: Grossly normal  Lab Results:  Recent Labs    06/17/20 0400 06/18/20 0332  WBC 5.6 4.5  HGB 10.2* 9.9*  HCT 33.9* 31.7*  PLT 170 120*   BMET Recent Labs    06/17/20 0400 06/18/20 0332  NA 135 135  K 3.5 4.1  CL 99 100  CO2 29 28  GLUCOSE 114* 101*  BUN 5* 7  CREATININE 0.78 0.76  CALCIUM 8.8* 8.6*   PT/INR No results for input(s): LABPROT, INR in the last 72 hours. ABG No results for input(s): PHART, HCO3 in the last 72 hours.  Invalid input(s): PCO2,  PO2  Studies/Results: DG CHEST PORT 1 VIEW  Result Date: 06/16/2020 CLINICAL DATA:  PICC line placement EXAM: PORTABLE CHEST 1 VIEW COMPARISON:  06/11/2020 FINDINGS: Normal cardiac silhouette. PICC line pace with tip in distal SVC. Patchy airspace markings. No pneumothorax. No focal consolidation. IMPRESSION: RIGHT PICC line placed without complication. Patchy airspace opacities in lower lobes. Electronically Signed   By: Suzy Bouchard M.D.   On: 06/16/2020 14:16   [place] EKG SITE RITE  Result Date: 06/16/2020 If Site Rite image not attached, placement could not be confirmed due to current cardiac rhythm.  [place] EKG SITE RITE  Result Date: 06/16/2020 If Site Rite image not attached, placement could not be confirmed due to current cardiac rhythm.   Anti-infectives: Anti-infectives (From admission, onward)   Start     Dose/Rate Route Frequency Ordered Stop   06/14/20 0000  ciprofloxacin (CIPRO) 500 MG tablet        500 mg Oral 2 times daily 06/14/20 1100 06/24/20 2359   06/14/20 0000  metroNIDAZOLE (FLAGYL) 500 MG tablet        500 mg Oral 3 times daily 06/14/20 1100 06/24/20 2359   06/11/20 1230  ertapenem (INVANZ) 1,000 mg in sodium chloride 0.9 % 100 mL IVPB        1 g 200 mL/hr over 30 Minutes Intravenous Every 24 hours 06/11/20 1114     06/06/20 1850  piperacillin-tazobactam (ZOSYN) IVPB 3.375 g  Status:  Discontinued        3.375 g 12.5 mL/hr over 240 Minutes Intravenous Every 8 hours 06/06/20 1850 06/11/20 1114  06/06/20 1830  piperacillin-tazobactam (ZOSYN) IVPB 3.375 g        3.375 g 100 mL/hr over 30 Minutes Intravenous  Once 06/06/20 1818 06/06/20 2039      Assessment/Plan:  Proceed as planned with cysto, bilateral stent placement for ureteral identificaiton / protection during colon extirpative surgery. Risks, benefits, expected peri-op course discussed.   Alexis Frock 06/18/2020

## 2020-06-18 NOTE — H&P (Shared)
NAME:  Tammy Davies, MRN:  [id number], DOB:  1960/11/03, LOS: 12 ADMISSION DATE:  06/05/2020, CONSULTATION DATE:  06/18/2020 REFERRING MD:  Donne Hazel, CHIEF COMPLAINT:  Pneumoperitoneum  All information from medical record as patient is in the OR.  Brief History:  60 year old female  Admitted 1/20 with  sigmoid diverticulitis w/ small phlegmon vs abscess . She had failed OP therapy with Augmentin. She had progressive symptoms and CT 06/18/2020 was concerning for obstruction vs. Stricture. Pt was taken to the OR for Exploratory Lap, L colectomy and End colectomy and small bowel resection. Marland Kitchen PCCM have been asked to assume care post op.  History of Present Illness:  60 year old female  with history of PAF on anticoagulation, tobacco use, OSA on CPAP, HTN, admitted [id number], for uncomplicated diverticulitis that was diagnosed 1/12.2022. She was treated with Augmentin. She failed outpatient therapy. She was found to be Covid + the day of admission.  She was treated with  close follow up imaging.Repeat CT 1/29 showed worsening sigmoid diverticulitis, still without evidence for perforation or abscess formation. There appeared to be some degree of obstruction as evidence by dilatation of the upstream colon now with associated air-fluid levels. She was taken to the OR 06/18/2020 due to ongoing symptoms and CT findings  concerning for obstruction vs stricture by Dr. Donne Hazel.She had a Left colectomy , End colostomy, and Small bowel resection .  PCCM have been asked to assume care post op.   Past Medical History:   Past Medical History:  Diagnosis Date  . Dysrhythmia   . History of COVID-19   . PAF (paroxysmal atrial fibrillation) ([place])   . Tobacco abuse   On chronic anticoagulation OSA on CPAP   Significant Hospital Events:  1/20 Admission 1/20 Covid +   Consults:  2/1 PCCM   Procedures:  2/1 Left colectomy, End colostomy , Small bowel resection( Dr. Donne Hazel)  Significant Diagnostic Tests:   06/18/2020 CT Abdomen and Pelvis Worsening sigmoid diverticulitis, still without evidence for perforation or abscess formation. There now appears to be some degree of obstruction as evidence by dilatation of the upstream colon now with associated air-fluid levels. Growing small bilateral pleural effusions. Additional chronic findings as detailed above.  06/10/2020 CT Abdomen/ Pelvis Persistent sigmoid diverticulitis. No drainable fluid collection or abscess. Partially visualized small bilateral pleural effusions, new since the prior CT. Fatty liver. Large right hepatic hemangioma better characterized on the prior MRI. Aortic Atherosclerosis (ICD10-I70.0).  CT Abdomen Pelvis 06/06/2020 Sigmoid diverticulosis with redemonstrated long segment wall thickening and adjacent fat stranding about the mid to distal sigmoid colon, consistent with acute diverticulitis. Suspect a small phlegmon or abscess just inferior to the distal sigmoid colon, closely abutting the posterior aspect of the lower uterine segment, measuring approximately 3.4 x 3.3 x 2.7 cm, which is more defined appearing than on prior examination. Unchanged small volume of fluid posterior to the midportion of the sigmoid, which is not clearly loculated. 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  05/29/2020 CT abdomen and Pelvis Findings are consistent with acute sigmoid diverticulitis. There is a small amount of pericolonic fluid on the left, but no well-defined extraluminal fluid collection to suggest an abscess. No evidence of bowel obstruction or free air. Stable hepatic hemangioma. Aortic Atherosclerosis (ICD10-I70.0).    Micro Data:   SARS Coronavirus 2 NEGATIVE POSITIVEAbnormal      Antimicrobials:   Colbert Ewing  06/11/2020 >>  Interim History / Subjective:    Objective   Blood  pressure 113/81, pulse 76, temperature 97.9 F (36.6 C), temperature source Oral, resp. rate 16, height 5\' 8"  (1.727 m), weight 108 kg, SpO2 98 %.        Intake/Output Summary (Last 24 hours) at 06/18/2020 1229 Last data filed at 06/18/2020 1147 Gross per 24 hour  Intake 2787.26 ml  Output 500 ml  Net 2287.26 ml   Filed Weights   06/15/20 0430 06/16/20 0404 06/18/20 0814  Weight: 110.5 kg 108 kg 108 kg    Examination: General: *** HENT: *** Lungs: *** Cardiovascular: *** Abdomen: *** Extremities: *** Neuro: *** GU: ***  Resolved Hospital Problem list     Assessment & Plan:  Acute pneumoperitoneum s/p failed conservative treatment for acute sigmoid diverticulitis with small abscess s/p left colectomy and end colostomy 2/1 - initially dx w/ diverticulitis 1/12, discharged home on Augmentin; failed outpatient therapy and admitted 1/20 with CT showing small abscess not large enough for IR drainage; repeated CT 1/29 showing worsening sigmoid diverticulitis with some evidence of obstruction - s/p peri-op ureteral stenting per Urology 2/1 - per [facility]  - TPN per pharmacy  - continue zosyn    Acute respiratory insufficiency in the post-operative setting Hx OSA on CPAP  - Full MV support, 4-8cc/kg IBW with goal Pplat <30 and DP<15 - CXR now/ ABG in 1 hour -VAP prevention protocol/ PPI -PAD protocol for sedation> *** -wean FiO2 as able for SpO2 >92% -daily SAT & SBT   Normocytic anemia, presumed secondary to slow diverticular bleeding - trend H/H  - transfuse for Hgb <7   Hx PAF  - home Eliquis on hold given surgery; resume heparin gtt when ok w/ surgery  - rate *** - cardizem and digoxin     HTN - continue cardizem ***   Tobacco abuse - tobacco cessation counseling when appropriate     COVID-19  - asymptomatic, continue supportive care   Best practice (evaluated daily)  Diet: *** Pain/Anxiety/Delirium protocol (if indicated): *** VAP protocol  (if indicated): *** DVT prophylaxis: *** GI prophylaxis: *** Glucose control: *** Mobility: *** Disposition:***  Goals of Care:  Last date of multidisciplinary goals of care discussion:*** Family and staff present: *** Summary of discussion: *** Follow up goals of care discussion due: *** Code Status: ***  Labs   CBC: Recent Labs  Lab 06/14/20 1109 06/15/20 0515 06/16/20 0406 06/17/20 0400 06/18/20 0332  WBC 5.8 6.5 5.5 5.6 4.5  NEUTROABS  --  5.2  --  3.8  --   HGB 10.4* 11.0* 10.2* 10.2* 9.9*  HCT 34.2* 36.2 32.6* 33.9* 31.7*  MCV 86.8 86.4 86.0 87.4 86.6  PLT 266 285 206 170 120*    Basic Metabolic Panel: Recent Labs  Lab 06/12/20 0244 06/12/20 1944 06/14/20 1109 06/15/20 0515 06/16/20 0406 06/17/20 0400 06/18/20 0332  NA 137   < > 138 136 136 135 135  K 3.9   < > 3.9 4.0 4.0 3.5 4.1  CL 104   < > 102 100 101 99 100  CO2 25   < > 26 27 24 29 28   GLUCOSE 92   < > 100* 114* 107* 114* 101*  BUN 7   < > <5* 5* 6 5* 7  CREATININE 0.81   < > 0.84 0.90 0.85 0.78 0.76  CALCIUM 9.2   < > 9.3 9.4 8.7* 8.8* 8.6*  MG 1.9  --   --   --   --  1.8 2.0  PHOS  --   --   --   --   --  3.9 4.3   < > = values in this interval not displayed.   GFR: Estimated Creatinine Clearance: 97.4 mL/min (by C-G formula based on SCr of 0.76 mg/dL). Recent Labs  Lab 06/15/20 0515 06/16/20 0406 06/17/20 0400 06/18/20 0332  WBC 6.5 5.5 5.6 4.5    Liver Function Tests: Recent Labs  Lab 06/15/20 0515 06/16/20 0406 06/17/20 0400  AST 17 20 15   ALT 24 22 19   ALKPHOS 110 95 87  BILITOT 0.6 0.6 0.7  PROT 6.3* 5.7* 5.6*  ALBUMIN 3.0* 2.8* 2.8*   Recent Labs  Lab 06/16/20 0406  LIPASE 19   No results for input(s): AMMONIA in the last 168 hours.  ABG No results found for: PHART, PCO2ART, PO2ART, HCO3, TCO2, ACIDBASEDEF, O2SAT   Coagulation Profile: No results for input(s): INR, PROTIME in the last 168 hours.  Cardiac Enzymes: No results for input(s): CKTOTAL, CKMB,  CKMBINDEX, TROPONINI in the last 168 hours.  HbA1C: Hgb A1c MFr Bld  Date/Time Value Ref Range Status  06/07/2020 06:03 AM 5.6 4.8 - 5.6 % Final    Comment:    (NOTE) Pre diabetes:          5.7%-6.4%  Diabetes:              >6.4%  Glycemic control for   <7.0% adults with diabetes   04/11/2018 09:07 AM 5.6 4.8 - 5.6 % Final    Comment:             Prediabetes: 5.7 - 6.4          Diabetes: >6.4          Glycemic control for adults with diabetes: <7.0     CBG: Recent Labs  Lab 06/17/20 1614 06/17/20 2010 06/17/20 2357 06/18/20 0441 06/18/20 0740  GLUCAP 107* 112* 96 102* 100*    Review of Systems:   ***  Past Medical History:  She,  has a past medical history of Dysrhythmia, History of COVID-19, PAF (paroxysmal atrial fibrillation) ([place]), and Tobacco abuse.   Surgical History:   Past Surgical History:  Procedure Laterality Date  . ABDOMINAL HYSTERECTOMY    . CHOLECYSTECTOMY N/A 08/18/2016   Procedure: LAPAROSCOPIC CHOLECYSTECTOMY WITH  INTRAOPERATIVE CHOLANGIOGRAM;  Surgeon: Johnathan Hausen, MD;  Location: [facility];  Service: General;  Laterality: N/A;     Social History:   reports that she has been smoking cigarettes. She has never used smokeless tobacco. She reports current drug use. Drug: Marijuana. She reports that she does not drink alcohol.   Family History:  Her family history includes Breast cancer in her mother; Diabetes in her father; Heart failure in her father and mother; Microcephaly in her father.   Allergies No Known Allergies   Home Medications  Prior to Admission medications   Medication Sig Start Date End Date Taking? Authorizing Provider  ciprofloxacin (CIPRO) 500 MG tablet Take 1 tablet (500 mg total) by mouth 2 (two) times daily for 10 days. 06/14/20 06/24/20 Yes Elgergawy, Silver Huguenin, MD  dicyclomine (BENTYL) 20 MG tablet Take 1 tablet (20 mg total) by mouth every 8 (eight) hours as needed for spasms (Abdominal cramping). 05/09/20  Yes Ward,  [name], DO  ibuprofen (ADVIL) 200 MG tablet Take 600 mg by mouth every 12 (twelve) hours as needed for headache or moderate pain.   Yes [provider]  metroNIDAZOLE (FLAGYL) 500 MG tablet  Take 1 tablet (500 mg total) by mouth 3 (three) times daily for 10 days. 06/14/20 06/24/20 Yes Elgergawy, Silver Huguenin, MD  Multiple Vitamin (MULTIVITAMIN) capsule Take 1 capsule by mouth daily.   Yes [provider]  ondansetron (ZOFRAN ODT) 4 MG disintegrating tablet Take 1 tablet (4 mg total) by mouth every 8 (eight) hours as needed for nausea or vomiting. 05/09/20  Yes Ward, [name], DO  apixaban (ELIQUIS) 5 MG TABS tablet Take 1 tablet (5 mg total) by mouth 2 (two) times daily. 06/14/20   Elgergawy, Silver Huguenin, MD  diltiazem (CARDIZEM CD) 120 MG 24 hr capsule Take 1 capsule (120 mg total) by mouth daily. 06/14/20   Elgergawy, Silver Huguenin, MD     Critical care time: ***

## 2020-06-18 NOTE — Anesthesia Procedure Notes (Signed)
Procedure Name: Intubation Date/Time: 06/18/2020 9:27 AM Performed by: Trinna Post., CRNA Pre-anesthesia Checklist: Patient identified, Emergency Drugs available, Suction available, Patient being monitored and Timeout performed Patient Re-evaluated:Patient Re-evaluated prior to induction Oxygen Delivery Method: Circle system utilized Preoxygenation: Pre-oxygenation with 100% oxygen Induction Type: IV induction Ventilation: Oral airway inserted - appropriate to patient size and Two handed mask ventilation required Laryngoscope Size: Mac and 3 Grade View: Grade I Tube type: Oral Tube size: 7.0 mm Number of attempts: 1 Airway Equipment and Method: Stylet Placement Confirmation: ETT inserted through vocal cords under direct vision,  positive ETCO2 and breath sounds checked- equal and bilateral Secured at: 22 cm Tube secured with: Tape Dental Injury: Teeth and Oropharynx as per pre-operative assessment

## 2020-06-18 NOTE — Progress Notes (Signed)
Pt arrived from unit from PACU..Will continue to monitor. Pt. Oriented to unit   Phoebe Sharps, RN

## 2020-06-18 NOTE — Consult Note (Addendum)
[place] Nurse ostomy consult note  [place] Nurse requested for preoperative stoma site marking by Dr. Donne Hazel.  Discussed surgical procedure and stoma creation with patient.  Explained role of the [place] nurse team.    Answered patient questions. She has worked as a Psychologist, counselling in the distant past and while she knows a little about ostomies, has not taken care of someone who has had on in quite a while. I demonstrate some of the newer pouching materials and demonstrate the Lock and Roll closure feature of the pouches.  I take extra care to put her mind at ease today and tell her we will not assume she knows anything at all about a stoma should one be created intraoperatively. She is tearful this morning because she has not been allowed visitors due to her positive Covid status (that is expiring today according to the records). She reports feeling comforted and better after our extended session this morning.  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  Examined patient lying, sitting, and standing in order to place the marking in the patient's visual field, away from any creases or abdominal contour issues and within the rectus muscle.  Attempted to mark below the patient's belt line. Abdomen is soft and obese with a pronounced crease between the two markings.  Site #1: Marked for colostomy in the LLQ  7.5 cm to the left of the umbilicus and 7.4JO below the umbilicus.  Site #2: Marked for colostomy in the LUQ  7cm to the left of the umbilicus and 8.7OM above the umbilicus.  Patient's abdomen cleansed with CHG wipes at site markings, allowed to air dry prior to marking. Covered mark with thin film transparent dressing to preserve mark until time of surgery.   Thank you for allowing [place] to meet and mark this nice patient and begin ostomy teaching.  We will follow postoperatively in the event  she has a stoma created for continued teaching and management.   Maudie Flakes, MSN, RN, [place], [facility]  Pager# [phone]

## 2020-06-18 NOTE — Anesthesia Procedure Notes (Signed)
Arterial Line Insertion Start/End2/05/2020 10:45 AM Performed by: Duane Boston, MD, anesthesiologist  Patient location: OR. Patient sedated Left, radial was placed Catheter size: 20 G Hand hygiene performed  and maximum sterile barriers used   Attempts: 1 Procedure performed without using ultrasound guided technique. Following insertion, Biopatch and dressing applied. Post procedure assessment: normal  Patient tolerated the procedure well with no immediate complications.

## 2020-06-18 NOTE — Transfer of Care (Signed)
Immediate Anesthesia Transfer of Care Note  Patient: Tammy Davies  Procedure(s) Performed: SIGMOID COLECTOMY WITH COLOSTOMY CREATION; DRAINAGE OF PELVIC ABSCESS (N/A Abdomen) EXPLORATORY LAPAROTOMY (N/A Abdomen) CYSTOSCOPY WITH STENT PLACEMENT (Bilateral Urethra)  Patient Location: PACU  Anesthesia Type:General  Level of Consciousness: awake, alert  and oriented  Airway & Oxygen Therapy: Patient Spontanous Breathing and Patient connected to face mask oxygen  Post-op Assessment: Report given to RN and Post -op Vital signs reviewed and stable  Post vital signs: Reviewed and stable  Last Vitals:  Vitals Value Taken Time  BP 137/66 06/18/20 1232  Temp    Pulse 50 06/18/20 1233  Resp 17 06/18/20 1233  SpO2 99 % 06/18/20 1233  Vitals shown include unvalidated device data.  Last Pain:  Vitals:   06/18/20 0814  TempSrc: Oral  PainSc: 0-No pain      Patients Stated Pain Goal: 2 (02/56/15 4884)  Complications: No complications documented.

## 2020-06-18 NOTE — Anesthesia Postprocedure Evaluation (Signed)
Anesthesia Post Note  Patient: Tammy Davies  Procedure(s) Performed: SIGMOID COLECTOMY WITH COLOSTOMY CREATION; DRAINAGE OF PELVIC ABSCESS (N/A Abdomen) EXPLORATORY LAPAROTOMY (N/A Abdomen) CYSTOSCOPY WITH STENT PLACEMENT (Bilateral Urethra)     Patient location during evaluation: PACU Anesthesia Type: General Level of consciousness: sedated Pain management: pain level controlled Vital Signs Assessment: post-procedure vital signs reviewed and stable Respiratory status: spontaneous breathing and respiratory function stable Cardiovascular status: stable Postop Assessment: no apparent nausea or vomiting Anesthetic complications: no   No complications documented.  Last Vitals:  Vitals:   06/18/20 1415 06/18/20 1430  BP: 133/78 140/76  Pulse: 79 74  Resp: 13 14  Temp: (!) 36.2 C   SpO2: 95% 96%    Last Pain:  Vitals:   06/18/20 1400  TempSrc:   PainSc: Asleep                 Ronan Duecker DANIEL

## 2020-06-18 NOTE — Progress Notes (Signed)
PHARMACY - TOTAL PARENTERAL NUTRITION CONSULT NOTE   Indication: Prolonged ileus  Patient Measurements: Height: 5' 8"  (172.7 cm) Weight: 108 kg (238 lb 1.6 oz) IBW/kg (Calculated) : 63.9 TPN AdjBW (KG): 75.7 Body mass index is 36.2 kg/m.  Assessment: 60 years of age female with past medical history of paroxysmal atrial fibrillation and tobacco use admitted on 06/05/20 with 1 month history of abdominal pain treated outpatient as constipation and uncomplicated diverticulitis. Pain progressively worsening despite antibiotic treatment outpatient. 06/05/20 CT showed sigmoid diverticulitis. On 06/15/20, patient symptoms changed to epigastric pain and worsening vomiting. Patient was made NPO. Patient was eating 50-100% of meals up until 06/13/20. CT 1/29 with worsening sigmoid diverticulitis and some degree of obstruction. Plan is to proceed with surgery tomorrow for bowel resection with colostomy.  Pharmacy consulted to start TPN for prolonged ileus.  Glucose / Insulin: No hx DM (A1c 5.6). CBGs 99-112. Utilized 0 units SSI.  Electrolytes: Na 135. K 4.1 (s/p 20 mEq KCl). Mg 2.0 (s/p 1g Mg). Phos 4.3. Other electrolytes wnl.  Renal: SCr 0.76 - stable. BUN wnl.  LFTs / TGs: LFTs  and Tbili wnl. Prealbumin / albumin: Albumin 2.8. Prealbumin 14.5 Intake / Output; MIVF: mIVF D5-1/2 at 75 ml/hr stopped at 1800 per discussion w/ Dr. Waldron Labs. UOP not recorded - 2 urine occurrences documented. LBM 1/30.  GI Imaging: 1/20 CT Abd: persistent sigmoid diverticulitis, no drainable fluid collection or abscess 1/29: CT Abd: worsening sigmoid diverticulitis, some degree of obstruction, no evidence for perforation or abscess, likely colostomy Surgeries / Procedures:  2/1: planned primary anastomosis vs. hartmanns Central access: PICC 06/16/20 TPN start date: 06/16/20  Nutritional Goals (per RD recommendations on 1/31): 2000-2200 kcal, 100-110 g protein, fluid > 2L/day Goal TPN rate 85 ml/hr providing 110g protein  daily and with 24 g/L lipids MWF providing weekly average of 2038 kcals/day - goal rate without lipids will meet ~91% of total kcal needs  Current Nutrition:  NPO  Plan:  Increase TPN to goal rate of 85 ml/hr at 1800 - this will provide 109g AA and 1824 kcal (~91% total kcal needs) Discussed with RD on 1/31 and given shortage of ILE will hold lipids for 7-10 days - if provided, will only be on MWF.  Electrolytes in TPN: increase Na to 75 mEq/L, decrease phos to 8 mmol/L. Continue 50 mEq/L of K, 5 mEq/L of Ca, 5 mEq/L of Mg. Cl:Ac 1:1.  Add standard MVI and trace elements to TPN Adjust Sensitive SSI to q8h and adjust as needed  Monitor TPN labs on Mon/Thurs, repeat electrolytes tomorrow  Cristela Felt, PharmD Clinical Pharmacist  06/18/2020,6:21 AM

## 2020-06-19 ENCOUNTER — Encounter (HOSPITAL_COMMUNITY): Payer: Self-pay | Admitting: General Surgery

## 2020-06-19 DIAGNOSIS — Z8616 Personal history of COVID-19: Secondary | ICD-10-CM | POA: Diagnosis not present

## 2020-06-19 DIAGNOSIS — K5720 Diverticulitis of large intestine with perforation and abscess without bleeding: Secondary | ICD-10-CM | POA: Diagnosis not present

## 2020-06-19 DIAGNOSIS — I480 Paroxysmal atrial fibrillation: Secondary | ICD-10-CM | POA: Diagnosis not present

## 2020-06-19 LAB — BASIC METABOLIC PANEL
Anion gap: 7 (ref 5–15)
BUN: 14 mg/dL (ref 6–20)
CO2: 26 mmol/L (ref 22–32)
Calcium: 8.3 mg/dL — ABNORMAL LOW (ref 8.9–10.3)
Chloride: 99 mmol/L (ref 98–111)
Creatinine, Ser: 0.74 mg/dL (ref 0.44–1.00)
GFR, Estimated: >60 mL/min (ref >60)
Glucose, Bld: 136 mg/dL — ABNORMAL HIGH (ref 70–99)
Potassium: 4.8 mmol/L (ref 3.5–5.1)
Sodium: 132 mmol/L — ABNORMAL LOW (ref 135–145)

## 2020-06-19 LAB — CBC
HCT: 28.9 % — ABNORMAL LOW (ref 36.0–46.0)
Hemoglobin: 8.8 g/dL — ABNORMAL LOW (ref 12.0–15.0)
MCH: 26.3 pg (ref 26.0–34.0)
MCHC: 30.4 g/dL (ref 30.0–36.0)
MCV: 86.3 fL (ref 80.0–100.0)
Platelets: 138 10*3/uL — ABNORMAL LOW (ref 150–400)
RBC: 3.35 MIL/uL — ABNORMAL LOW (ref 3.87–5.11)
RDW: 15.9 % — ABNORMAL HIGH (ref 11.5–15.5)
WBC: 12.3 10*3/uL — ABNORMAL HIGH (ref 4.0–10.5)
nRBC: 0 % (ref 0.0–0.2)

## 2020-06-19 LAB — GLUCOSE, CAPILLARY
Glucose-Capillary: 125 mg/dL — ABNORMAL HIGH (ref 70–99)
Glucose-Capillary: 125 mg/dL — ABNORMAL HIGH (ref 70–99)
Glucose-Capillary: 124 mg/dL — ABNORMAL HIGH (ref 70–99)
Glucose-Capillary: 121 mg/dL — ABNORMAL HIGH (ref 70–99)

## 2020-06-19 LAB — TRIGLYCERIDES: Triglycerides: 77 mg/dL (ref <150)

## 2020-06-19 LAB — MAGNESIUM: Magnesium: 2 mg/dL (ref 1.7–2.4)

## 2020-06-19 LAB — PHOSPHORUS: Phosphorus: 3.7 mg/dL (ref 2.5–4.6)

## 2020-06-19 MED ORDER — ALUM & MAG HYDROXIDE-SIMETH 200-200-20 MG/5ML PO SUSP
15.0000 mL | Freq: Four times a day (QID) | ORAL | Status: DC | PRN
  Administered 2020-06-19 – 2020-06-20 (×3): 15 mL via ORAL
  Filled 2020-06-19 (×3): qty 30

## 2020-06-19 MED ORDER — ACETAMINOPHEN 10 MG/ML IV SOLN
1000.0000 mg | Freq: Four times a day (QID) | INTRAVENOUS | Status: AC
  Administered 2020-06-19 – 2020-06-20 (×3): 1000 mg via INTRAVENOUS
  Filled 2020-06-19 (×4): qty 100

## 2020-06-19 MED ORDER — INSULIN ASPART 100 UNIT/ML ~~LOC~~ SOLN
0.0000 [IU] | SUBCUTANEOUS | Status: DC
  Administered 2020-06-19 (×4): 1 [IU] via SUBCUTANEOUS

## 2020-06-19 MED ORDER — TRACE MINERALS CU-MN-SE-ZN 300-55-60-3000 MCG/ML IV SOLN
INTRAVENOUS | Status: AC
  Filled 2020-06-19: qty 727.6

## 2020-06-19 MED ORDER — ENOXAPARIN SODIUM 40 MG/0.4ML ~~LOC~~ SOLN
40.0000 mg | SUBCUTANEOUS | Status: DC
  Administered 2020-06-19 – 2020-06-20 (×2): 40 mg via SUBCUTANEOUS
  Filled 2020-06-19 (×2): qty 0.4

## 2020-06-19 MED ORDER — TRACE MINERALS CU-MN-SE-ZN 300-55-60-3000 MCG/ML IV SOLN
INTRAVENOUS | Status: DC
  Filled 2020-06-19: qty 727.6

## 2020-06-19 NOTE — Progress Notes (Signed)
1 Day Post-Op   Subjective/Chief Complaint:   1 - Severe Diverticulitis, Request for Peri-Op Ureteral Stenting - s/p peri-op ureteral stenting for colon resection 06/18/20.   Today "Tammy Davies" is  stable after partial colectomy with colostomy. UOP excellent, GFR normal.    Objective: Vital signs in last 24 hours: Temp:  [97.1 F (36.2 C)-98.2 F (36.8 C)] 97.6 F (36.4 C) (02/02 0415) Pulse Rate:  [74-115] 75 (02/02 0554) Resp:  [10-24] 17 (02/02 0554) BP: (106-140)/(66-87) 111/71 (02/02 0415) SpO2:  [93 %-98 %] 97 % (02/02 0554) Arterial Line BP: (92-147)/(67-80) 92/71 (02/01 1500) Weight:  [888 kg] 108 kg (02/01 0814) Last BM Date: 06/17/20  Intake/Output from previous day: 02/01 0701 - 02/02 0700 In: 3837.3 [P.O.:280; I.V.:2317.8; IV Piggyback:1239.6] Out: 2800 [Urine:2950; Drains:605; Blood:200] Intake/Output this shift: No intake/output data recorded.  General appearance: alert and cooperative Eyes: negative Nose: Nares normal. Septum midline. Mucosa normal. No drainage or sinus tenderness. Throat: lips, mucosa, and tongue normal; teeth and gums normal Neck: supple, symmetrical, trachea midline Back: symmetric, no curvature. ROM normal. No CVA tenderness. Resp: non-labored on room air.  Cardio: Nl rate GI: obese, no CVAT. Midline open skin with gauze dressing. LLQ colostomy pink with scant serous output.  Extremities: extremities normal, atraumatic, no cyanosis or edema Lymph nodes: Cervical, supraclavicular, and axillary nodes normal. Neurologic: Grossly normal Foley with stents still in place. Stents and adaptor removed, foley connected to gravity drain.   Lab Results:  Recent Labs    06/18/20 1319 06/19/20 0436  WBC 13.0* 12.3*  HGB 10.0* 8.8*  HCT 32.6* 28.9*  PLT 141* 138*   BMET Recent Labs    06/18/20 1319 06/19/20 0436  NA 133* 132*  K 4.1 4.8  CL 101 99  CO2 23 26  GLUCOSE 217* 136*  BUN 9 14  CREATININE 0.82 0.74  CALCIUM 8.3* 8.3*    PT/INR No results for input(s): LABPROT, INR in the last 72 hours. ABG Recent Labs    06/18/20 1102 06/18/20 1319  PHART 7.388 7.397  HCO3 23.3 25.6    Studies/Results: DG Retrograde Pyelogram  Result Date: 06/18/2020 CLINICAL DATA:  Bilateral stent placement with retrograde pyelogram. EXAM: INTRAOPERATIVE BILATERAL RETROGRADE UROGRAPHY TECHNIQUE: Images were obtained with the C-arm fluoroscopic device intraoperatively and submitted for interpretation post-operatively. Please see the procedural report for the amount of contrast and the fluoroscopy time utilized. COMPARISON:  CT abdomen pelvis 06/15/2020 FINDINGS: Five intraoperative fluoroscopic images are submitted for interpretation. There is mild irregular narrowing of the distal right ureter. Rounded filling defect in the distal left ureter most likely air bubble or debris. Bilateral ureteral stents are noted. IMPRESSION: Intraoperative fluoroscopic images of retrograde urography as above. Electronically Signed   By: Miachel Roux M.D.   On: 06/18/2020 11:10    Anti-infectives: Anti-infectives (From admission, onward)   Start     Dose/Rate Route Frequency Ordered Stop   06/18/20 1730  piperacillin-tazobactam (ZOSYN) IVPB 3.375 g  Status:  Discontinued        3.375 g 12.5 mL/hr over 240 Minutes Intravenous Every 8 hours 06/18/20 1644 06/18/20 1644   06/18/20 1730  piperacillin-tazobactam (ZOSYN) IVPB 3.375 g        3.375 g 12.5 mL/hr over 240 Minutes Intravenous Every 8 hours 06/18/20 1644 06/23/20 1359   06/14/20 0000  ciprofloxacin (CIPRO) 500 MG tablet        500 mg Oral 2 times daily 06/14/20 1100 06/24/20 2359   06/14/20 0000  metroNIDAZOLE (FLAGYL) 500 MG  tablet        500 mg Oral 3 times daily 06/14/20 1100 06/24/20 2359   06/11/20 1230  ertapenem (INVANZ) 1,000 mg in sodium chloride 0.9 % 100 mL IVPB  Status:  Discontinued        1 g 200 mL/hr over 30 Minutes Intravenous Every 24 hours 06/11/20 1114 06/18/20 1644    06/06/20 1850  piperacillin-tazobactam (ZOSYN) IVPB 3.375 g  Status:  Discontinued        3.375 g 12.5 mL/hr over 240 Minutes Intravenous Every 8 hours 06/06/20 1850 06/11/20 1114   06/06/20 1830  piperacillin-tazobactam (ZOSYN) IVPB 3.375 g        3.375 g 100 mL/hr over 30 Minutes Intravenous  Once 06/06/20 1818 06/06/20 2039      Assessment/Plan:  Stents out today as per above. DC foley whenever not needed for accurate UOP monitoring per primary team.  Will sign off.   Alexis Frock 06/19/2020

## 2020-06-19 NOTE — Consult Note (Addendum)
[place] Nurse Consult Note: Patient receiving care in [place].  Primary RN, Anderson Malta, at bedside to assist with [facility] placement and ostomy care.   Reason for Consult: NPWT to midline abdominal incision Wound type: surgical Pressure Injury POA: Yes/No/NA Measurement: 20 cm x 6.8 cm x 3.4 cm Wound bed: 100% pink with some exposed adipose tissue Drainage (amount, consistency, odor) none.   Periwound: intact Dressing procedure/placement/frequency: two pieces of black foam placed into wound, drape applied, immediate seal obtained.  [place] Nurse ostomy follow up Stoma type/location: LUQ end colostomy created 2/1. Stomal assessment/size: round, moist, pink, edematous, sutures intact, 1.5 cm budded and round Peristomal assessment: intact Treatment options for stomal/peristomal skin: barrier ring if needed; not needed today Output: drops of serosanginous in existing pouch  Ostomy pouching: 1pc. Pouch placed, [employer].  Education provided: opening/closing tail; how to remove existing pouch; to perform peristomal cleaning with water only; how to use measuring device; how to cut new opening, how to place. Enrolled patient in [place] Start Discharge program: No.  Education folder provided.  Additional VAC and ostomy supplies in room. Val Riles, RN, MSN, CWOCN, CNS-BC, pager [phone]

## 2020-06-19 NOTE — Op Note (Signed)
NAME: Tammy Davies, JACKO MEDICAL RECORD MP:[id number] ACCOUNT [id number] DATE OF BIRTH:12-18-1960 FACILITY: [facility] LOCATION: [id number] PHYSICIAN:Farzad Tibbetts, MD  OPERATIVE REPORT  DATE OF PROCEDURE:  06/18/2020  PREOPERATIVE DIAGNOSIS:  Severe diverticulitis.  PROCEDURE: 1.  Cystoscopy, bilateral retrograde pyelograms, interpretation. 2.  Insertion of bilateral ureteral stents.  ESTIMATED BLOOD LOSS:  Nil.  MEDICATIONS:  None.  SPECIMENS:  None.  FINDINGS: 1.  Unremarkable urinary bladder. 2.  Unremarkable bilateral retrograde pyelograms. 3.  Successful placement of bilateral ureteral stents, proximal end in renal pelvis, distal end externalized. 4.  Right (green), left (yellow) externalized stents.  INDICATIONS:  The patient is a 60 year old lady found to have a significant diverticulitis.  She has been on medical therapy; however, she has worsened by clinical parameters and imaging.  She is undergoing extirpated surgery today with plan for  segmental colectomy and colostomy by general surgery team for her refractory diverticulitis.  She has significant inflammatory phlegmon of the sigmoid encroaching upon the area of the left mid ureter and to help assist in ureteral identification and  protection general surgery team has requested bilateral perioperative stenting.  She was examined and found to be a suitable candidate.  Informed consent was obtained and placed in medical record.  DESCRIPTION OF PROCEDURE:  The patient being identified, procedure being cystoscopy, bilateral stent placement confirmed.  Procedure timeout was performed.  Intravenous antibiotics were verified.  The patient was placed into a low lithotomy position.   Sterile field was created, prepped and draped the patient's vagina, introitus and proximal thighs using iodine.  Cystourethroscopy was performed using 21-French rigid cystoscope with offset lens.  Inspection of bladder revealed no diverticula,   calcifications or papillary lesions.  There was mild cystocele.  Right ureteral orifice was cannulated with a 6-French green open-ended catheter and right retrograde pyelogram was obtained.  Right retrograde pyelogram demonstrated single right ureter, single system right kidney.  No filling defects or narrowing noted.  Under continuous fluoroscopic guidance, the open-ended catheter was advanced to the level of the renal pelvis and set aside.   Next, the left ureteral orifice was cannulated with a yellow colored 5-French open-ended catheter and left retrograde pyelogram was obtained.  Left retrograde pyelogram demonstrated a single left ureter, single system left kidney.  No filling defects or narrowing noted.  Under continuous fluoroscopic guidance the open-ended catheter was advanced to the level of the upper pole and set aside.  A  Foley catheter was placed per urethra straight drain, 10 mL around the balloon and a silk tie was used to fashion the right and left stents together into the Foley catheter to prevent inadvertent early displacement.  Final fluoroscopic images  corroborated stents remaining in good position, bilateral renal pelvises.  The Perry adapter was then attached to the stents and the catheter and to gravity drainage.  Procedure was terminated.  The patient tolerated the procedure well.  No immediate  perioperative complications.  The patient to remain in [place] [facility] at [facility] for the general surgery portion of her procedure today.  HN/NUANCE  D:06/18/2020 T:06/18/2020 JOB:[id number]

## 2020-06-19 NOTE — Progress Notes (Addendum)
Plan of care reviewed. Pt's hemodynamically stable. Atrial flutter on monitor, HR 70s, BP within normal limits, afebrile, on 3 lpm of O2 NCL, SPO2 95-97%. No distress noted. On Cardizem gtt at 5 mg/hr.   Pt started a few sip of water, refused clear brought at dinner time. Stated she will try at am. Denied nausea and vomiting. On TPN at 85 ml/hr. Monitor CBG.  Tolerated pain well, controlled with Dilaudid, Acetaminophen IV and Robaxin IV. Pt was able to rest pretty good at night.   Colostomy stoma appeared pink and no active bleeding. No stool from colostomy.  JP drain had 160 ml of bright bloody drainage for night shift, but total recorded 605 ml of drainage in 24 hours. We will follow up CBC this am.  Midline abdominal dressing dry and clean, no drainage. Will change BID with moist to dry dressing the 1st time this morning per PACU reported. Per MD ordered,  planed to put wound vac on, wound care consulted.  Hematuria earlier in OR, but tonight urine appeared more clear with minimal pink tint from Foley catheter.  Pt refused to ambulated tonight. She agreed to ambulate at am. No immediate distress. We continue to monitor.  Kennyth Lose, RN

## 2020-06-19 NOTE — Progress Notes (Signed)
1 Day Post-Op   Subjective/Chief Complaint: No n/v pain controlled, woc here now, stents out (I left in due to bloody urine)   Objective: Vital signs in last 24 hours: Temp:  [97.1 F (36.2 C)-98.4 F (36.9 C)] 98.4 F (36.9 C) (02/02 0743) Pulse Rate:  [73-115] 73 (02/02 0743) Resp:  [10-24] 16 (02/02 0743) BP: (98-140)/(66-87) 98/72 (02/02 0743) SpO2:  [93 %-98 %] 97 % (02/02 0743) Arterial Line BP: (92-147)/(67-80) 92/71 (02/01 1500) Last BM Date: 06/17/20  Intake/Output from previous day: 02/01 0701 - 02/02 0700 In: 3837.3 [P.O.:280; I.V.:2317.8; IV Piggyback:1239.6] Out: 4132 [Urine:2950; Drains:605; Blood:200] Intake/Output this shift: No intake/output data recorded.  GI: soft stoma pink and viable some bs jp serosang wound with vac in place clean  Lab Results:  Recent Labs    06/18/20 1319 06/19/20 0436  WBC 13.0* 12.3*  HGB 10.0* 8.8*  HCT 32.6* 28.9*  PLT 141* 138*   BMET Recent Labs    06/18/20 1319 06/19/20 0436  NA 133* 132*  K 4.1 4.8  CL 101 99  CO2 23 26  GLUCOSE 217* 136*  BUN 9 14  CREATININE 0.82 0.74  CALCIUM 8.3* 8.3*   PT/INR No results for input(s): LABPROT, INR in the last 72 hours. ABG Recent Labs    06/18/20 1102 06/18/20 1319  PHART 7.388 7.397  HCO3 23.3 25.6    Studies/Results: DG Retrograde Pyelogram  Result Date: 06/18/2020 CLINICAL DATA:  Bilateral stent placement with retrograde pyelogram. EXAM: INTRAOPERATIVE BILATERAL RETROGRADE UROGRAPHY TECHNIQUE: Images were obtained with the C-arm fluoroscopic device intraoperatively and submitted for interpretation post-operatively. Please see the procedural report for the amount of contrast and the fluoroscopy time utilized. COMPARISON:  CT abdomen pelvis 06/15/2020 FINDINGS: Five intraoperative fluoroscopic images are submitted for interpretation. There is mild irregular narrowing of the distal right ureter. Rounded filling defect in the distal left ureter most likely air bubble  or debris. Bilateral ureteral stents are noted. IMPRESSION: Intraoperative fluoroscopic images of retrograde urography as above. Electronically Signed   By: Miachel Roux M.D.   On: 06/18/2020 11:10    Anti-infectives: Anti-infectives (From admission, onward)   Start     Dose/Rate Route Frequency Ordered Stop   06/18/20 1730  piperacillin-tazobactam (ZOSYN) IVPB 3.375 g  Status:  Discontinued        3.375 g 12.5 mL/hr over 240 Minutes Intravenous Every 8 hours 06/18/20 1644 06/18/20 1644   06/18/20 1730  piperacillin-tazobactam (ZOSYN) IVPB 3.375 g        3.375 g 12.5 mL/hr over 240 Minutes Intravenous Every 8 hours 06/18/20 1644 06/23/20 1359   06/14/20 0000  ciprofloxacin (CIPRO) 500 MG tablet        500 mg Oral 2 times daily 06/14/20 1100 06/24/20 2359   06/14/20 0000  metroNIDAZOLE (FLAGYL) 500 MG tablet        500 mg Oral 3 times daily 06/14/20 1100 06/24/20 2359   06/11/20 1230  ertapenem (INVANZ) 1,000 mg in sodium chloride 0.9 % 100 mL IVPB  Status:  Discontinued        1 g 200 mL/hr over 30 Minutes Intravenous Every 24 hours 06/11/20 1114 06/18/20 1644   06/06/20 1850  piperacillin-tazobactam (ZOSYN) IVPB 3.375 g  Status:  Discontinued        3.375 g 12.5 mL/hr over 240 Minutes Intravenous Every 8 hours 06/06/20 1850 06/11/20 1114   06/06/20 1830  piperacillin-tazobactam (ZOSYN) IVPB 3.375 g        3.375 g 100 mL/hr  over 30 Minutes Intravenous  Once 06/06/20 1818 06/06/20 2039      Assessment/Plan: POD 1 sigmoid colectomy/colostomy/sbr- Donne Hazel -clear liquids -vac changes M/W/F -foley in until tomorrow to monitor urine -oob, aggressive pulm toilet -hold iv heparin until tomorrow- I wrote for proph lovenox FEN: clears, TPN VTE; hold heparin gtt, lovenox Foley: 24 more hours  Rolm Bookbinder 06/19/2020

## 2020-06-19 NOTE — Progress Notes (Signed)
PHARMACY - TOTAL PARENTERAL NUTRITION CONSULT NOTE   Indication: Prolonged ileus  Patient Measurements: Height: 5' 8"  (172.7 cm) Weight: 108 kg (238 lb 1.6 oz) IBW/kg (Calculated) : 63.9 TPN AdjBW (KG): 74.9 Body mass index is 36.2 kg/m.  Assessment: 60 years of age female with past medical history of paroxysmal atrial fibrillation and tobacco use admitted on 06/05/20 with 1 month history of abdominal pain treated outpatient as constipation and uncomplicated diverticulitis. Pain progressively worsening despite antibiotic treatment outpatient. 06/05/20 CT showed sigmoid diverticulitis. On 06/15/20, patient symptoms changed to epigastric pain and worsening vomiting. Patient was made NPO. Patient was eating 50-100% of meals up until 06/13/20. CT 1/29 with worsening sigmoid diverticulitis and some degree of obstruction. Plan is to proceed with surgery tomorrow for bowel resection with colostomy.  Pharmacy consulted to start TPN for prolonged ileus.  Glucose / Insulin: No hx DM (A1c 5.6). CBGs 100-130s with 2 elevated CBGs of 217 and 191. Utilized 3 units SSI. 1 dose of dexamethasone 78m IV yesterday.  Electrolytes: Na 132. Corrected Ca wnl. Ionized Ca 1.12 on 2/1. Cl 99 - stable. Other electrolytes wnl.  Renal: SCr 0.74 - stable. BUN wnl.  LFTs / TGs: LFTs  and Tbili wnl. TG 77.  Prealbumin / albumin: Albumin 2.8. Prealbumin 14.5 Intake / Output; MIVF:  Off MIVF. UOP 1.1 ml/kg/hr (x1 urine occurrence documented) Colostomy 0 mL. R JP drain 605 mL.  GI Imaging: 1/20 CT Abd: persistent sigmoid diverticulitis, no drainable fluid collection or abscess 1/29: CT Abd: worsening sigmoid diverticulitis, some degree of obstruction, no evidence for perforation or abscess, likely colostomy Surgeries / Procedures:  2/1: ex lap with left colectomy, end colostomy and small bowel resection Central access: PICC 06/16/20 TPN start date: 06/16/20  Nutritional Goals (per RD recommendations on 1/31): 2000-2200 kcal,  100-110 g protein, fluid > 2L/day Goal TPN rate 85 ml/hr providing 110g protein daily and with 24 g/L lipids MWF providing weekly average of 2038 kcals/day - goal rate without lipids will meet ~91% of total kcal needs  Current Nutrition:  TPN Clear liquid diet - started 2/1 PM but per patient report nothing ate yesterday due to pt not in the mood to eat but is looking forward to eating this morning (breakfast not arrived yet).  Plan:  Continue TPN at goal rate of 85 ml/hr - this will provide 109g AA and 1824 kcal (~91% total kcal needs) Discussed with RD on 1/31 and given shortage of ILE will hold lipids for 7-10 days - if provided, will only be on MWF.  Electrolytes in TPN: increase Na to 100 mEq/L, increase Ca to 7 mEq/L, decrease K to 35 mEq/L. Continue 8 mmol/L of Phos, 5 mEq/L of Mg. Cl:Ac 1:1  Add standard MVI and trace elements to TPN Adjust Sensitive SSI to q4h and adjust as needed  Monitor TPN labs on Mon/Thurs Follow up tolerance of diet and advancement  GCristela Felt PharmD Clinical Pharmacist  06/19/2020,7:20 AM

## 2020-06-19 NOTE — Progress Notes (Signed)
PROGRESS NOTE                                                                                                                                                                                                             Patient Demographics:    Tammy Davies, is a 60 y.o. female, DOB - [birth year], QQI:[id number]  Outpatient Primary MD for the patient is Patient, No Pcp Per   Admit date - 06/05/2020   LOS - 73  Chief Complaint  Patient presents with  . Abdominal Pain       Brief Narrative: Patient is a 60 y.o. female with PMHx of PAF on anticoagulation, OSA on CPAP, HTN-with diverticulitis not responsive to conservative treatment-subsequently underwent Hartman's procedure on 2/1.  See below for further details  Significant Events: 1/19>> Admit to [facility] for diverticulitis 2/1>> exploratory laparotomy, left colectomy and colostomy 2/1>> cystoscopy with insertion of bilateral ureteral stents  Antibiotics: Zosyn: 1/20>> 1/24, 2/1>> Ertapenem: 1/25>> 1/31  Microbiology data: None  Procedures: 2/1>> exploratory laparotomy, left colectomy and colostomy 2/1>> cystoscopy with insertion of bilateral ureteral stents  Consults: Surgery, urology  DVT prophylaxis: enoxaparin (LOVENOX) injection 40 mg Start: 06/19/20 1000 SCDs Start: 06/06/20 1952    Subjective:    Bertice Risse today feels better having some pain at the operative site.   Assessment  & Plan :   Acute complicated diverticulitis with abscess: Failed medical management-underwent laparotomy with left colectomy and end ostomy-along with cystoscopy and insertion of bilateral ureteral stents (phlegmon encroaching left ureter)-General surgery/urology following and directing care.  Remains on empiric Zosyn.  PAF: Remains in atrial flutter this morning-controlled with Cardizem infusion.  Heparin GTT on hold as she is postoperative-and will be resumed when  okay with surgery.  HTN: Stable with Cardizem.  Normocytic anemia: Due to acute illness-no blood loss.  Transfuse if hemoglobin <7  COVID-19 infection: Incidental finding-does not require isolation any further.   COVID-19 Labs: No results for input(s): DDIMER, FERRITIN, LDH, CRP in the last 72 hours.     Component Value Date/Time   BNP 279.9 (H) 06/11/2020 0956    No results for input(s): PROCALCITON in the last 168 hours.  Lab Results  Component Value Date   SARSCOV2NAA POSITIVE (A) 06/06/2020      Nutrition Problem: Nutrition Problem: Inadequate oral intake Etiology: inability to eat Signs/Symptoms:  NPO status Interventions: Refer to RD note for recommendations  Obesity: Estimated body mass index is 36.2 kg/m as calculated from the following:   Height as of this encounter: 5' 8"  (1.727 m).   Weight as of this encounter: 108 kg.   RN pressure injury documentation:    ABG:    Component Value Date/Time   PHART 7.397 06/18/2020 1319   PCO2ART 42.2 06/18/2020 1319   PO2ART 77.1 (L) 06/18/2020 1319   HCO3 25.6 06/18/2020 1319   TCO2 24 06/18/2020 1102   ACIDBASEDEF 2.0 06/18/2020 1102   O2SAT 94.9 06/18/2020 1319    Vent Settings: N/A   Condition - Stable  Family Communication  :    Code Status :  Full Code  Diet :  Diet Order            Diet clear liquid Room service appropriate? No; Fluid consistency: Thin; Fluid restriction: 1500 mL Fluid  Diet effective now           Diet - low sodium heart healthy                  Disposition Plan  :   Status is: Inpatient  Remains inpatient appropriate because:Inpatient level of care appropriate due to severity of illness   Dispo: The patient is from: Home              Anticipated d/c is to: TBD              Anticipated d/c date is: > 3 days              Patient currently is not medically stable to d/c.   Difficult to place patient No   Barriers to discharge: S/p Hartman's procedure-awaiting bowel  function/advancing diet-not stable for discharge as of yet.  On TNA.  Antimicorbials  :    Anti-infectives (From admission, onward)   Start     Dose/Rate Route Frequency Ordered Stop   06/18/20 1730  piperacillin-tazobactam (ZOSYN) IVPB 3.375 g  Status:  Discontinued        3.375 g 12.5 mL/hr over 240 Minutes Intravenous Every 8 hours 06/18/20 1644 06/18/20 1644   06/18/20 1730  piperacillin-tazobactam (ZOSYN) IVPB 3.375 g        3.375 g 12.5 mL/hr over 240 Minutes Intravenous Every 8 hours 06/18/20 1644 06/23/20 1359   06/14/20 0000  ciprofloxacin (CIPRO) 500 MG tablet        500 mg Oral 2 times daily 06/14/20 1100 06/24/20 2359   06/14/20 0000  metroNIDAZOLE (FLAGYL) 500 MG tablet        500 mg Oral 3 times daily 06/14/20 1100 06/24/20 2359   06/11/20 1230  ertapenem (INVANZ) 1,000 mg in sodium chloride 0.9 % 100 mL IVPB  Status:  Discontinued        1 g 200 mL/hr over 30 Minutes Intravenous Every 24 hours 06/11/20 1114 06/18/20 1644   06/06/20 1850  piperacillin-tazobactam (ZOSYN) IVPB 3.375 g  Status:  Discontinued        3.375 g 12.5 mL/hr over 240 Minutes Intravenous Every 8 hours 06/06/20 1850 06/11/20 1114   06/06/20 1830  piperacillin-tazobactam (ZOSYN) IVPB 3.375 g        3.375 g 100 mL/hr over 30 Minutes Intravenous  Once 06/06/20 1818 06/06/20 2039      Inpatient Medications  Scheduled Meds: . Chlorhexidine Gluconate Cloth  6 each Topical Daily  . enoxaparin (LOVENOX) injection  40 mg Subcutaneous Q24H  .  feeding supplement (GLUCERNA SHAKE)  237 mL Oral BID BM  . insulin aspart  0-9 Units Subcutaneous Q4H  . lip balm  1 application Topical BID  . polycarbophil  625 mg Oral BID   Continuous Infusions: . acetaminophen    . diltiazem (CARDIZEM) infusion 5 mg/hr (06/19/20 0932)  . lactated ringers 20 mL/hr at 06/19/20 0600  . methocarbamol (ROBAXIN) IV 1,000 mg (06/18/20 2155)  . piperacillin-tazobactam (ZOSYN)  IV 3.375 g (06/19/20 0548)  . TPN ADULT (ION) 85  mL/hr at 06/18/20 1900  . TPN ADULT (ION)     PRN Meds:.diphenhydrAMINE, HYDROmorphone (DILAUDID) injection, Ipratropium-Albuterol, lactated ringers, magic mouthwash, methocarbamol (ROBAXIN) IV, metoprolol tartrate, ondansetron (ZOFRAN) IV, prochlorperazine, sodium chloride flush   Time Spent in minutes  25  See all Orders from today for further details   Oren Binet M.D on 06/19/2020 at 10:28 AM  To page go to [email or web address] - use universal password  [facility] -  Office  [phone]    Objective:   Vitals:   06/18/20 2325 06/19/20 0415 06/19/20 0554 06/19/20 0743  BP: 109/77 111/71  98/72  Pulse: 76 75 75 73  Resp: 12 10 17 16   Temp: 98.2 F (36.8 C) 97.6 F (36.4 C)  98.4 F (36.9 C)  TempSrc: Oral Oral  Oral  SpO2: 95% 95% 97% 97%  Weight:      Height:        Wt Readings from Last 3 Encounters:  06/18/20 108 kg  05/09/20 111.1 kg  03/27/19 111.9 kg     Intake/Output Summary (Last 24 hours) at 06/19/2020 1028 Last data filed at 06/19/2020 0900 Gross per 24 hour  Intake 3557.31 ml  Output 3810 ml  Net -252.69 ml     Physical Exam Gen Exam:Alert awake-not in any distress HEENT:atraumatic, normocephalic Chest: B/L clear to auscultation anteriorly CVS:S1S2 regular Abdomen: Appropriately tender-and dressings dry. Extremities:no edema Neurology: Non focal Skin: no rash   Data Review:    CBC Recent Labs  Lab 06/15/20 0515 06/16/20 0406 06/17/20 0400 06/18/20 0332 06/18/20 1102 06/18/20 1319 06/19/20 0436  WBC 6.5 5.5 5.6 4.5  --  13.0* 12.3*  HGB 11.0* 10.2* 10.2* 9.9* 9.5* 10.0* 8.8*  HCT 36.2 32.6* 33.9* 31.7* 28.0* 32.6* 28.9*  PLT 285 206 170 120*  --  141* 138*  MCV 86.4 86.0 87.4 86.6  --  86.9 86.3  MCH 26.3 26.9 26.3 27.0  --  26.7 26.3  MCHC 30.4 31.3 30.1 31.2  --  30.7 30.4  RDW 16.3* 16.3* 16.1* 16.1*  --  16.0* 15.9*  LYMPHSABS 0.9  --  1.1  --   --   --   --   MONOABS 0.4  --  0.5  --   --   --   --   EOSABS 0.0  --   0.1  --   --   --   --   BASOSABS 0.0  --  0.0  --   --   --   --     Chemistries  Recent Labs  Lab 06/15/20 0515 06/16/20 0406 06/17/20 0400 06/18/20 0332 06/18/20 1102 06/18/20 1319 06/19/20 0436  NA 136 136 135 135 138 133* 132*  K 4.0 4.0 3.5 4.1 4.0 4.1 4.8  CL 100 101 99 100  --  101 99  CO2 27 24 29 28   --  23 26  GLUCOSE 114* 107* 114* 101*  --  217* 136*  BUN 5* 6 5* 7  --  9 14  CREATININE 0.90 0.85 0.78 0.76  --  0.82 0.74  CALCIUM 9.4 8.7* 8.8* 8.6*  --  8.3* 8.3*  MG  --   --  1.8 2.0  --   --  2.0  AST 17 20 15   --   --   --   --   ALT 24 22 19   --   --   --   --   ALKPHOS 110 95 87  --   --   --   --   BILITOT 0.6 0.6 0.7  --   --   --   --    ------------------------------------------------------------------------------------------------------------------ Recent Labs    06/17/20 0400 06/19/20 0436  TRIG 107 77    Lab Results  Component Value Date   HGBA1C 5.6 06/07/2020   ------------------------------------------------------------------------------------------------------------------ No results for input(s): TSH, T4TOTAL, T3FREE, THYROIDAB in the last 72 hours.  Invalid input(s): FREET3 ------------------------------------------------------------------------------------------------------------------ No results for input(s): VITAMINB12, FOLATE, FERRITIN, TIBC, IRON, RETICCTPCT in the last 72 hours.  Coagulation profile No results for input(s): INR, PROTIME in the last 168 hours.  No results for input(s): DDIMER in the last 72 hours.  Cardiac Enzymes No results for input(s): CKMB, TROPONINI, MYOGLOBIN in the last 168 hours.  Invalid input(s): CK ------------------------------------------------------------------------------------------------------------------    Component Value Date/Time   BNP 279.9 (H) 06/11/2020 9211    Micro Results No results found for this or any previous visit (from the past 240 hour(s)).  Radiology Reports CT  ABDOMEN PELVIS W CONTRAST  Result Date: 06/15/2020 CLINICAL DATA:  Abdominal distension.  COVID positive. EXAM: CT ABDOMEN AND PELVIS WITH CONTRAST TECHNIQUE: Multidetector CT imaging of the abdomen and pelvis was performed using the standard protocol following bolus administration of intravenous contrast. CONTRAST:  13m OMNIPAQUE IOHEXOL 300 MG/ML  SOLN COMPARISON:  June 10, 2020 FINDINGS: Lower chest: There are small bilateral pleural effusions with adjacent atelectasis.The heart size is normal. 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 There is a similar hypoattenuating area adjacent to the gallbladder fossa. The patient is status post prior cholecystectomy.There is no biliary ductal dilation. Pancreas: Normal contours without ductal dilatation. No peripancreatic fluid collection. Spleen: Unremarkable. Adrenals/Urinary Tract: --Adrenal glands: Unremarkable. --Right kidney/ureter: No hydronephrosis or radiopaque kidney stones. --Left kidney/ureter: No hydronephrosis or radiopaque kidney stones. --Urinary bladder: Unremarkable. Stomach/Bowel: --Stomach/Duodenum: No hiatal hernia or other gastric abnormality. Normal duodenal course and caliber. --Small bowel: Unremarkable. --Colon: Again noted are extensive inflammatory changes of the sigmoid colon consistent with sigmoid diverticulitis. These changes have substantially worsened since the prior study. There are now appears to be some degree of obstruction at this level as evidence by mildly dilated upstream colon with air-fluid levels. There are scattered colonic diverticula throughout the remaining portions of the colon. There is no abscess. There is no free air. --Appendix: Normal. Vascular/Lymphatic: Atherosclerotic calcification is present within the non-aneurysmal abdominal aorta, without hemodynamically significant stenosis. --No retroperitoneal lymphadenopathy. --No mesenteric lymphadenopathy. --No pelvic or inguinal  lymphadenopathy. Reproductive: Unremarkable Other: No ascites or free air. The abdominal wall is normal. Musculoskeletal. No acute displaced fractures. IMPRESSION: 1. Worsening sigmoid diverticulitis, still without evidence for perforation or abscess formation. There now appears to be some degree of obstruction as evidence by dilatation of the upstream colon now with associated air-fluid levels. 2. Growing small bilateral pleural effusions. 3. Additional chronic findings as detailed above. Aortic Atherosclerosis (ICD10-I70.0). Electronically Signed   By: CConstance HolsterM.D.   On: 06/15/2020 17:09  CT ABDOMEN PELVIS W CONTRAST  Result Date: 06/10/2020 CLINICAL DATA:  60 year old female with abdominal pain. Concern for acute diverticulitis. EXAM: CT ABDOMEN AND PELVIS WITH CONTRAST TECHNIQUE: Multidetector CT imaging of the abdomen and pelvis was performed using the standard protocol following bolus administration of intravenous contrast. CONTRAST:  161m OMNIPAQUE IOHEXOL 300 MG/ML  SOLN COMPARISON:  CT abdomen pelvis dated 06/06/2020. FINDINGS: Lower chest: Partially visualized small bilateral pleural effusions, new since the prior CT. There is associated partial compressive atelectasis of the lower lobes. Pneumonia is less likely but not excluded clinical correlation is recommended. No intra-abdominal free air. Small free fluid in the pelvis. Hepatobiliary: Fatty liver. Large right hepatic hemangioma better characterized on the prior MRI. No intrahepatic biliary ductal dilatation. Cholecystectomy. Stable appearance of hypoenhancing area in the left lobe of the liver superior to the cholecystectomy. Pancreas: Unremarkable. No pancreatic ductal dilatation or surrounding inflammatory changes. Spleen: Normal in size without focal abnormality. Adrenals/Urinary Tract: The adrenal glands unremarkable. The kidneys, visualized ureters, and urinary bladder appear unremarkable. Stomach/Bowel: There is sigmoid  diverticulosis with diffusely thickened and inflamed sigmoid colon most likely acute diverticulitis, and less likely colitis. Clinical correlation is recommended. Overall no significant improvement in the inflammatory changes of the sigmoid colon compared to prior CT. Small amount of partially loculated fluid or phlegmon to the left of the sigmoid colon as seen on the prior CT. No discrete drainable fluid collection or abscess identified. There is a 2.2 x 1.8 cm ill-defined inflammatory area inferior to the sigmoid colon (76/3). No extraluminal air. There is no bowel obstruction. The appendix is normal. Vascular/Lymphatic: Mild aortoiliac atherosclerotic disease. The IVC is unremarkable. No portal venous gas. There is no adenopathy. Reproductive: The uterus is anteverted and grossly unremarkable. No adnexal masses. Other: Midline vertical anterior pelvic wall incisional scar. Musculoskeletal: Osteopenia with degenerative changes of the spine. No acute osseous pathology. IMPRESSION: 1. Persistent sigmoid diverticulitis. No drainable fluid collection or abscess. 2. Partially visualized small bilateral pleural effusions, new since the prior CT. 3. Fatty liver. Large right hepatic hemangioma better characterized on the prior MRI. 4. Aortic Atherosclerosis (ICD10-I70.0). Electronically Signed   By: AAnner CreteM.D.   On: 06/10/2020 16:22   CT ABDOMEN PELVIS W CONTRAST  Result Date: 06/06/2020 CLINICAL DATA:  Diverticulitis, constipation, worsening pain EXAM: CT ABDOMEN AND PELVIS WITH CONTRAST TECHNIQUE: Multidetector CT imaging of the abdomen and pelvis was performed using the standard protocol following bolus administration of intravenous contrast. CONTRAST:  1041mOMNIPAQUE IOHEXOL 300 MG/ML  SOLN COMPARISON:  CT abdomen pelvis, 05/29/2020, abdominal MR, 08/15/2016 FINDINGS: Lower chest: No acute abnormality. Hepatobiliary: No solid liver abnormality is seen.Redemonstrated large subcapsular hemangiomata, most  conspicuously in the inferior right lobe of the liver, hepatic segment VI, better characterized by prior MR (series 3, image 27). Status post cholecystectomy. No biliary ductal dilatation. Pancreas: Unremarkable. No pancreatic ductal dilatation or surrounding inflammatory changes. Spleen: Normal in size without significant abnormality. Adrenals/Urinary Tract: Adrenal glands are unremarkable. Kidneys are normal, without renal calculi, solid lesion, or hydronephrosis. Bladder is unremarkable. Stomach/Bowel: Stomach is within normal limits. Incidental diverticula of the descending portion of the duodenum. Appendix appears normal. Sigmoid diverticulosis with redemonstrated long segment wall thickening and adjacent fat stranding about the mid to distal sigmoid colon. Suspect a small phlegmon or abscess just inferior to the distal sigmoid colon, closely abutting the posterior aspect of the lower uterine segment, measuring approximately 3.4 x 3.3 x 2.7 cm (series 3, image 74, series 7, image 76). Unchanged  small volume of fluid posterior to the midportion of the sigmoid measuring approximately 3.4 x 1.7 cm (series 3, image 70). Vascular/Lymphatic: No significant vascular findings are present. No enlarged abdominal or pelvic lymph nodes. Reproductive: No mass or other significant abnormality. Other: No abdominal wall hernia or abnormality. No abdominopelvic ascites. Musculoskeletal: No acute or significant osseous findings. IMPRESSION: 1. Sigmoid diverticulosis with redemonstrated long segment wall thickening and adjacent fat stranding about the mid to distal sigmoid colon, consistent with acute diverticulitis. 2. Suspect a small phlegmon or abscess just inferior to the distal sigmoid colon, closely abutting the posterior aspect of the lower uterine segment, measuring approximately 3.4 x 3.3 x 2.7 cm, which is more defined appearing than on prior examination. 3. Unchanged small volume of fluid posterior to the midportion of  the sigmoid, which is not clearly loculated. 4. 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 Electronically Signed   By: Eddie Candle M.D.   On: 06/06/2020 15:01   CT ABDOMEN PELVIS W CONTRAST  Addendum Date: 05/29/2020   ADDENDUM REPORT: 05/29/2020 12:53 ADDENDUM: This case was discussed with Dr. Roslynn Amble following its interpretation. He reports that there was small volume contrast extravasation at the IV site during the injection for the CT which had not been reported in the technologist notes. Technologist indicates this was approximately 10 cc of contrast. Small extravasation volumes of contrast are typically without consequence. Recommend elevation of the extremity with intermittent cold and hot compresses. Electronically Signed   By: Richardean Sale M.D.   On: 05/29/2020 12:53   Result Date: 05/29/2020 CLINICAL DATA:  Constipation for 1 month with left lower quadrant abdominal pain. Diverticulitis suspected. EXAM: CT ABDOMEN AND PELVIS WITH CONTRAST TECHNIQUE: Multidetector CT imaging of the abdomen and pelvis was performed using the standard protocol following bolus administration of intravenous contrast. CONTRAST:  39m OMNIPAQUE IOHEXOL 300 MG/ML  SOLN COMPARISON:  Abdominal MRI 08/15/2016. FINDINGS: Lower chest: Clear lung bases. No significant pleural or pericardial effusion. Hepatobiliary: Mass involving segments 6 and 7 previously characterized as a hemangioma on MRI is unchanged in size, measuring 8.3 x 6.6 cm on image 28/3. There is focal fat superior to the cholecystectomy bed. No new or enlarging hepatic lesions. No significant biliary dilatation post cholecystectomy. Pancreas: Unremarkable. No pancreatic ductal dilatation or surrounding inflammatory changes. Spleen: Normal in size without focal abnormality. Adrenals/Urinary Tract: Both adrenal glands appear normal. The  kidneys appear normal without evidence of urinary tract calculus, suspicious lesion or hydronephrosis. No bladder abnormalities are seen. Stomach/Bowel: No enteric contrast was administered. The stomach and small bowel appear normal aside from a small proximal duodenal diverticulum. The appendix and proximal colon appear normal. There is long segment wall thickening of the sigmoid colon associated with diverticulosis and surrounding inflammatory changes, consistent with acute diverticulitis. There is a small amount of pericolonic fluid on the left measuring 3.5 x 1.5 cm on image 74/3, but no well-defined extraluminal fluid collection to suggest an abscess. No evidence of bowel obstruction or free air. Vascular/Lymphatic: There are no enlarged abdominal or pelvic lymph nodes. Minimal aortoiliac atherosclerosis without acute vascular findings. The portal, superior mesenteric and splenic veins are patent. Reproductive: The uterus and ovaries appear normal. No adnexal mass. Other: As above, inflammatory changes in the pelvis with a small extraluminal fluid collection lateral to the sigmoid colon. No ascites or free air. Musculoskeletal: No acute or significant osseous findings. Lumbar spondylosis  noted. IMPRESSION: 1. Findings are consistent with acute sigmoid diverticulitis. There is a small amount of pericolonic fluid on the left, but no well-defined extraluminal fluid collection to suggest an abscess. No evidence of bowel obstruction or free air. 2. Stable hepatic hemangioma. 3. Aortic Atherosclerosis (ICD10-I70.0). Electronically Signed: By: Richardean Sale M.D. On: 05/29/2020 12:06   DG Retrograde Pyelogram  Result Date: 06/18/2020 CLINICAL DATA:  Bilateral stent placement with retrograde pyelogram. EXAM: INTRAOPERATIVE BILATERAL RETROGRADE UROGRAPHY TECHNIQUE: Images were obtained with the C-arm fluoroscopic device intraoperatively and submitted for interpretation post-operatively. Please see the procedural  report for the amount of contrast and the fluoroscopy time utilized. COMPARISON:  CT abdomen pelvis 06/15/2020 FINDINGS: Five intraoperative fluoroscopic images are submitted for interpretation. There is mild irregular narrowing of the distal right ureter. Rounded filling defect in the distal left ureter most likely air bubble or debris. Bilateral ureteral stents are noted. IMPRESSION: Intraoperative fluoroscopic images of retrograde urography as above. Electronically Signed   By: Miachel Roux M.D.   On: 06/18/2020 11:10   DG CHEST PORT 1 VIEW  Result Date: 06/16/2020 CLINICAL DATA:  PICC line placement EXAM: PORTABLE CHEST 1 VIEW COMPARISON:  06/11/2020 FINDINGS: Normal cardiac silhouette. PICC line pace with tip in distal SVC. Patchy airspace markings. No pneumothorax. No focal consolidation. IMPRESSION: RIGHT PICC line placed without complication. Patchy airspace opacities in lower lobes. Electronically Signed   By: Suzy Bouchard M.D.   On: 06/16/2020 14:16   DG Chest Port 1 View  Result Date: 06/11/2020 CLINICAL DATA:  Dyspnea.  COVID positive EXAM: PORTABLE CHEST 1 VIEW COMPARISON:  10/11/2017 FINDINGS: Cardiac enlargement without heart failure. Lungs are clear without infiltrate or effusion. IMPRESSION: No active disease. Electronically Signed   By: Franchot Gallo M.D.   On: 06/11/2020 10:22   DG ABD ACUTE 2+V W 1V CHEST  Result Date: 06/15/2020 CLINICAL DATA:  Perforated abdominal viscus EXAM: DG ABDOMEN ACUTE WITH 1 VIEW CHEST COMPARISON:  Chest x-ray 06/11/2020, CT abdomen pelvis 06/10/2020 FINDINGS: There is no evidence of dilated bowel loops or free intraperitoneal air. No radiopaque calculi or other significant radiographic abnormality is seen. Surgical clips overlie the right upper quadrant. The heart size and mediastinal contours are unchanged. Bibasilar compressive changes. No definite focal consolidation. No pulmonary edema. No pleural effusion. No pneumothorax. No acute osseous  abnormality. IMPRESSION: Negative abdominal radiographs.  No acute cardiopulmonary disease. Electronically Signed   By: Iven Finn M.D.   On: 06/15/2020 04:06   DG ABD ACUTE 2+V W 1V CHEST  Result Date: 06/14/2020 CLINICAL DATA:  Abdominal pain and emesis EXAM: DG ABDOMEN ACUTE WITH 1 VIEW CHEST COMPARISON:  Chest radiograph June 11, 2020; CT abdomen and pelvis June 10, 2020 FINDINGS: AP chest: There is no edema or airspace opacity. Heart is borderline enlarged with pulmonary vascularity normal. No adenopathy. Supine and upright abdomen: There is moderate stool in the colon. There is no bowel dilatation or air-fluid level to suggest bowel obstruction. No free air. There are surgical clips in the right upper abdomen. IMPRESSION: No bowel obstruction or free air evident. Moderate stool in colon. No edema or airspace opacity. Borderline cardiac enlargement. Electronically Signed   By: Lowella Grip III M.D.   On: 06/14/2020 10:48   [place] EKG SITE RITE  Result Date: 06/16/2020 If Site Rite image not attached, placement could not be confirmed due to current cardiac rhythm.  [place] EKG SITE RITE  Result Date: 06/16/2020 If Site Rite image not attached, placement could not  be confirmed due to current cardiac rhythm.

## 2020-06-19 NOTE — Evaluation (Signed)
Physical Therapy Evaluation Patient Details Name: Tammy Davies MRN: [id number] DOB: [birth year] Today's Date: 06/19/2020   History of Present Illness  Pt is a 60 y.o. F with significant PMH of paroxysmal A. fib on anticoaguation, tobacco use, HTN, admitted for abdominal pain. CT abdomen pelvis showed sigmoid diverticulitis with small abscess. Patient went for exploratory laparotomy with left colectomy, end colostomy and small bowel resection by Dr. Donne Hazel 06/18/2020.  Clinical Impression  Prior to admission, pt works as a Network engineer; plans to discharge home with her sons. Pt presents with decreased functional mobility secondary to decreased endurance and pain. Requiring min assist for transfers and ambulating x 60 feet with a walker at a min guard assist level. SpO2 96% on RA, HR stable 70-80's. Suspect good progress considering PLOF and motivation. Will continue to progress mobility as tolerated. Do not anticipate pt will need PT follow up.     Follow Up Recommendations No PT follow up;Supervision for mobility/OOB    Equipment Recommendations  Rolling walker with 5" wheels    Recommendations for Other Services       Precautions / Restrictions Precautions Precautions: Fall;Other (comment) Precaution Comments: colostomy, wound vac, JP drain Restrictions Weight Bearing Restrictions: No      Mobility  Bed Mobility Overal bed mobility: Needs Assistance Bed Mobility: Rolling;Sidelying to Sit Rolling: Supervision Sidelying to sit: Min assist       General bed mobility comments: Cues for log roll technique, minA for trunk to upright    Transfers Overall transfer level: Needs assistance Equipment used: Rolling walker (2 wheeled) Transfers: Sit to/from Stand Sit to Stand: Min assist         General transfer comment: MinA to boost to stand from edge of bed, cues for hand placement  Ambulation/Gait Ambulation/Gait assistance: Min guard Gait Distance (Feet): 60  Feet Assistive device: Rolling walker (2 wheeled) Gait Pattern/deviations: Step-through pattern;Decreased stride length Gait velocity: decreased   General Gait Details: Slow and steady pace, cues for walker use, min guard for safety  Stairs            Wheelchair Mobility    Modified Rankin (Stroke Patients Only)       Balance Overall balance assessment: Needs assistance Sitting-balance support: Feet supported Sitting balance-Leahy Scale: Good     Standing balance support: Bilateral upper extremity supported Standing balance-Leahy Scale: Poor Standing balance comment: reliant on external support                             Pertinent Vitals/Pain Pain Assessment: Faces Faces Pain Scale: Hurts little more Pain Location: incisional Pain Descriptors / Indicators: Discomfort;Guarding;Grimacing Pain Intervention(s): Limited activity within patient's tolerance;Monitored during session;Premedicated before session    Home Living Family/patient expects to be discharged to:: Private residence Living Arrangements: Children Available Help at Discharge: Family;Available 24 hours/day Type of Home: Apartment (townhouse) Home Access: Stairs to enter   [employer] of Steps: 1 Home Layout: Two level Home Equipment: Environmental consultant - 2 wheels      Prior Function Level of Independence: Independent         Comments: Works as a Psychologist, sport and exercise        Extremity/Trunk Assessment   Upper Extremity Assessment Upper Extremity Assessment: Overall WFL for tasks assessed    Lower Extremity Assessment Lower Extremity Assessment: Overall WFL for tasks assessed       Communication   Communication: No difficulties  Cognition Arousal/Alertness: Awake/alert Behavior During Therapy: WFL for tasks assessed/performed Overall Cognitive Status: Within Functional Limits for tasks assessed                                         General Comments      Exercises     Assessment/Plan    PT Assessment Patient needs continued PT services  PT Problem List Decreased strength;Decreased activity tolerance;Decreased balance;Decreased mobility;Pain       PT Treatment Interventions DME instruction;Gait training;Stair training;Functional mobility training;Therapeutic activities;Therapeutic exercise;Balance training;Patient/family education    PT Goals (Current goals can be found in the Care Plan section)  Acute Rehab PT Goals Patient Stated Goal: return home PT Goal Formulation: With patient Time For Goal Achievement: 07/03/20 Potential to Achieve Goals: Good    Frequency Min 3X/week   Barriers to discharge        Co-evaluation               AM-PAC PT "6 Clicks" Mobility  Outcome Measure Help needed turning from your back to your side while in a flat bed without using bedrails?: None Help needed moving from lying on your back to sitting on the side of a flat bed without using bedrails?: A Little Help needed moving to and from a bed to a chair (including a wheelchair)?: A Little Help needed standing up from a chair using your arms (e.g., wheelchair or bedside chair)?: A Little Help needed to walk in hospital room?: A Little Help needed climbing 3-5 steps with a railing? : A Lot 6 Click Score: 18    End of Session   Activity Tolerance: Patient tolerated treatment well Patient left: in chair;with call bell/phone within reach Nurse Communication: Mobility status PT Visit Diagnosis: Pain;Difficulty in walking, not elsewhere classified (R26.2) Pain - part of body:  (abdomen)    Time: 8676-7209 PT Time Calculation (min) (ACUTE ONLY): 25 min   Charges:   PT Evaluation $PT Eval Moderate Complexity: 1 Mod PT Treatments $Gait Training: 8-22 mins        Wyona Almas, PT, DPT Acute Rehabilitation Services Pager [phone] Office [phone]   Deno Etienne 06/19/2020, 5:05 PM

## 2020-06-20 ENCOUNTER — Inpatient Hospital Stay (HOSPITAL_COMMUNITY): Payer: No Typology Code available for payment source

## 2020-06-20 DIAGNOSIS — Z8616 Personal history of COVID-19: Secondary | ICD-10-CM | POA: Diagnosis not present

## 2020-06-20 DIAGNOSIS — K5720 Diverticulitis of large intestine with perforation and abscess without bleeding: Secondary | ICD-10-CM | POA: Diagnosis not present

## 2020-06-20 DIAGNOSIS — I480 Paroxysmal atrial fibrillation: Secondary | ICD-10-CM | POA: Diagnosis not present

## 2020-06-20 LAB — COMPREHENSIVE METABOLIC PANEL
ALT: 20 U/L (ref 0–44)
AST: 19 U/L (ref 15–41)
Albumin: 2.7 g/dL — ABNORMAL LOW (ref 3.5–5.0)
Alkaline Phosphatase: 88 U/L (ref 38–126)
Anion gap: 9 (ref 5–15)
BUN: 15 mg/dL (ref 6–20)
CO2: 29 mmol/L (ref 22–32)
Calcium: 8.8 mg/dL — ABNORMAL LOW (ref 8.9–10.3)
Chloride: 96 mmol/L — ABNORMAL LOW (ref 98–111)
Creatinine, Ser: 0.88 mg/dL (ref 0.44–1.00)
GFR, Estimated: >60 mL/min (ref >60)
Glucose, Bld: 137 mg/dL — ABNORMAL HIGH (ref 70–99)
Potassium: 4.3 mmol/L (ref 3.5–5.1)
Sodium: 134 mmol/L — ABNORMAL LOW (ref 135–145)
Total Bilirubin: 0.7 mg/dL (ref 0.3–1.2)
Total Protein: 5.7 g/dL — ABNORMAL LOW (ref 6.5–8.1)

## 2020-06-20 LAB — PHOSPHORUS: Phosphorus: 3.5 mg/dL (ref 2.5–4.6)

## 2020-06-20 LAB — GLUCOSE, CAPILLARY
Glucose-Capillary: 109 mg/dL — ABNORMAL HIGH (ref 70–99)
Glucose-Capillary: 120 mg/dL — ABNORMAL HIGH (ref 70–99)
Glucose-Capillary: 121 mg/dL — ABNORMAL HIGH (ref 70–99)
Glucose-Capillary: 121 mg/dL — ABNORMAL HIGH (ref 70–99)
Glucose-Capillary: 132 mg/dL — ABNORMAL HIGH (ref 70–99)
Glucose-Capillary: 147 mg/dL — ABNORMAL HIGH (ref 70–99)

## 2020-06-20 LAB — CBC
HCT: 29.3 % — ABNORMAL LOW (ref 36.0–46.0)
Hemoglobin: 9.4 g/dL — ABNORMAL LOW (ref 12.0–15.0)
MCH: 27.4 pg (ref 26.0–34.0)
MCHC: 32.1 g/dL (ref 30.0–36.0)
MCV: 85.4 fL (ref 80.0–100.0)
Platelets: 153 10*3/uL (ref 150–400)
RBC: 3.43 MIL/uL — ABNORMAL LOW (ref 3.87–5.11)
RDW: 16.6 % — ABNORMAL HIGH (ref 11.5–15.5)
WBC: 10.9 10*3/uL — ABNORMAL HIGH (ref 4.0–10.5)
nRBC: 0 % (ref 0.0–0.2)

## 2020-06-20 LAB — MAGNESIUM: Magnesium: 1.9 mg/dL (ref 1.7–2.4)

## 2020-06-20 LAB — HEPARIN LEVEL (UNFRACTIONATED): Heparin Unfractionated: 0.52 IU/mL (ref 0.30–0.70)

## 2020-06-20 LAB — SURGICAL PATHOLOGY

## 2020-06-20 MED ORDER — TRAVASOL 10 % IV SOLN
INTRAVENOUS | Status: AC
  Filled 2020-06-20: qty 1091.4

## 2020-06-20 MED ORDER — HEPARIN (PORCINE) 25000 UT/250ML-% IV SOLN
1800.0000 [IU]/h | INTRAVENOUS | Status: DC
  Administered 2020-06-20 – 2020-06-21 (×2): 2300 [IU]/h via INTRAVENOUS
  Administered 2020-06-21 – 2020-06-22 (×2): 2200 [IU]/h via INTRAVENOUS
  Administered 2020-06-22: 1900 [IU]/h via INTRAVENOUS
  Filled 2020-06-20 (×9): qty 250

## 2020-06-20 MED ORDER — TRACE MINERALS CU-MN-SE-ZN 300-55-60-3000 MCG/ML IV SOLN: INTRAVENOUS | Status: DC

## 2020-06-20 MED ORDER — ACETAMINOPHEN 10 MG/ML IV SOLN
1000.0000 mg | Freq: Four times a day (QID) | INTRAVENOUS | Status: AC
  Administered 2020-06-20 – 2020-06-21 (×2): 1000 mg via INTRAVENOUS
  Filled 2020-06-20 (×4): qty 100

## 2020-06-20 NOTE — Progress Notes (Signed)
PHARMACY - TOTAL PARENTERAL NUTRITION CONSULT NOTE   Indication: Prolonged ileus  Patient Measurements: Height: 5' 8"  (172.7 cm) Weight: 108 kg (238 lb 1.6 oz) IBW/kg (Calculated) : 63.9 TPN AdjBW (KG): 74.9 Body mass index is 36.2 kg/m.  Assessment: 60 years of age female with past medical history of paroxysmal atrial fibrillation and tobacco use admitted on 06/05/20 with 1 month history of abdominal pain treated outpatient as constipation and uncomplicated diverticulitis. Pain progressively worsening despite antibiotic treatment outpatient. 06/05/20 CT showed sigmoid diverticulitis. On 06/15/20, patient symptoms changed to epigastric pain and worsening vomiting. Patient was made NPO. Patient was eating 50-100% of meals up until 06/13/20. CT 1/29 with worsening sigmoid diverticulitis and some degree of obstruction. Plan is to proceed with surgery 2/1 for bowel resection with colostomy.  Pharmacy consulted for TPN given prolonged ileus.  Glucose / Insulin: No hx DM (A1c 5.6). CBGs 120-147. Utilized 4 units SSI.   Electrolytes: Na 134. Cl 96. Corrected Ca wnl. Ionized Ca 1.12 on 2/1. Other electrolytes wnl.  Renal: SCr 0.74 - stable. BUN wnl.  LFTs / TGs: LFTs  and Tbili wnl. TG 77.  Prealbumin / albumin: Albumin 2.7. Prealbumin 14.5 Intake / Output; MIVF:  Off MIVF. UOP 1.2 ml/kg/hr. Colostomy 100 mL. R JP drain 253m. Vomited large amount yellow emesis 2/3 AM GI Imaging: 1/20 CT Abd: persistent sigmoid diverticulitis, no drainable fluid collection or abscess 1/29: CT Abd: worsening sigmoid diverticulitis, some degree of obstruction, no evidence for perforation or abscess, likely colostomy Surgeries / Procedures:  2/1: ex lap with left colectomy, end colostomy and small bowel resection Central access: PICC 06/16/20 TPN start date: 06/16/20  Nutritional Goals (per RD recommendations on 1/31): 2000-2200 kcal, 100-110 g protein, fluid > 2L/day Goal TPN rate 85 ml/hr providing 110g protein daily  and with 24 g/L lipids MWF providing weekly average of 2038 kcals/day - goal rate without lipids will meet ~91% of total kcal needs  Current Nutrition:  TPN Clear liquid diet - started 2/1 PM but 0 intake documented, active emesis 2/3 AM   Plan:  Continue TPN at goal rate of 85 ml/hr - this will provide 109g AA and 1824 kcal (~91% total kcal needs) Discussed with RD on 1/31 and given shortage of ILE will hold lipids for 7-10 days - today is day 5; if provided, will only be on MWF.  Electrolytes in TPN: increase Na to 125 mEq/L, 8 mEq/L of Mg, Cl:Ac 2:1. Continue Ca to 7 mEq/L, K to 35 mEq/L, 8 mmol/L of Phos. Add standard MVI and trace elements to TPN Stop SSI  Monitor TPN labs on Mon/Thurs Follow up tolerance of diet and advancement  LBenetta Spar PharmD, BCPS, BHealtheast Woodwinds HospitalClinical Pharmacist  Please check AMION for all [place]phone numbers After 10:00 PM, call M[place]o [phone]

## 2020-06-20 NOTE — Evaluation (Signed)
Occupational Therapy Evaluation Patient Details Name: Tammy Davies MRN: [id number] DOB: 1960-09-18 Today's Date: 06/20/2020    History of Present Illness Pt is a 60 y.o. F with significant PMH of paroxysmal A. fib on anticoaguation, tobacco use, HTN, admitted for abdominal pain. CT abdomen pelvis showed sigmoid diverticulitis with small abscess. Patient went for exploratory laparotomy with left colectomy, end colostomy and small bowel resection by Dr. Donne Hazel 06/18/2020.   Clinical Impression   Patient admitted for the diagnosis and procedure above.  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  She is very familiar with DME and adaptive equipment available.  OT discussed shower chair and long handled sponge, patient will wait to see what is needed.  Currently she is moving pretty well needing supervision mainly for all her lines and leads, and Min A for lower body ADL.  OT indicated for 1x/wk to monitor status and ensure she continues to progress toward home with no services.      Follow Up Recommendations  No OT follow up    Equipment Recommendations  None recommended by OT    Recommendations for Other Services       Precautions / Restrictions Precautions Precautions: Fall Precaution Comments: colostomy, wound vac, JP drain Restrictions Weight Bearing Restrictions: No      Mobility Bed Mobility Overal bed mobility: Modified Independent Bed Mobility: Sidelying to Sit Rolling: Modified independent (Device/Increase time)              Transfers Overall transfer level: Needs assistance   Transfers: Sit to/from Stand Sit to Stand: Supervision              Balance Overall balance assessment: Needs assistance Sitting-balance support: Feet supported Sitting balance-Leahy Scale: Good     Standing balance support: Bilateral upper extremity supported Standing balance-Leahy Scale: Fair Standing balance comment: RW to  reduce incisional wound discomfort.                           ADL either performed or assessed with clinical judgement   ADL Overall ADL's : Needs assistance/impaired     Grooming: Wash/dry hands;Wash/dry face;Oral care;Set up;Sitting           Upper Body Dressing : Set up;Sitting   Lower Body Dressing: Minimal assistance;Sit to/from stand               Functional mobility during ADLs: Supervision/safety;Rolling walker General ADL Comments: lines and leads     Vision Baseline Vision/History: Wears glasses Wears Glasses: At all times Patient Visual Report: No change from baseline       Perception     Praxis      Pertinent Vitals/Pain Faces Pain Scale: Hurts little more Pain Location: incisional Pain Descriptors / Indicators: Grimacing;Tender Pain Intervention(s): Monitored during session     Hand Dominance Right   Extremity/Trunk Assessment Upper Extremity Assessment Upper Extremity Assessment: Overall WFL for tasks assessed   Lower Extremity Assessment Lower Extremity Assessment: Defer to PT evaluation   Cervical / Trunk Assessment Cervical / Trunk Assessment: Normal   Communication Communication Communication: No difficulties   Cognition Arousal/Alertness: Awake/alert Behavior During Therapy: WFL for tasks assessed/performed Overall Cognitive Status: Within Functional Limits for tasks assessed  Home Living Family/patient expects to be discharged to:: Private residence Living Arrangements: Children Available Help at Discharge: Family;Available 24 hours/day Type of Home: Apartment Home Access: Stairs to enter     Home Layout: Two level   Alternate Level Stairs-Rails: Right Bathroom Shower/Tub: Teacher, early years/pre: Standard     Home Equipment: Environmental consultant - 2 wheels          Prior Functioning/Environment Level of Independence: Independent         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 Problem List: Impaired balance (sitting and/or standing);Pain      OT Treatment/Interventions: Self-care/ADL training;Therapeutic exercise;Balance training;Therapeutic activities    OT Goals(Current goals can be found in the care plan section) Acute Rehab OT Goals Patient Stated Goal: get home and return to work OT Goal Formulation: With patient Time For Goal Achievement: 07/04/20 Potential to Achieve Goals: Good ADL Goals Pt Will Perform Lower Body Bathing: Independently Pt Will Perform Lower Body Dressing: Independently Pt Will Transfer to Toilet: Independently;ambulating;regular height toilet  OT Frequency: Min 1X/week   Barriers to D/C:    none noted       Co-evaluation              AM-PAC OT "6 Clicks" Daily Activity     Outcome Measure Help from another person eating meals?: None Help from another person taking care of personal grooming?: None Help from another person toileting, which includes using toliet, bedpan, or urinal?: A Little Help from another person bathing (including washing, rinsing, drying)?: A Little Help from another person to put on and taking off regular upper body clothing?: None Help from another person to put on and taking off regular lower body clothing?: A Little 6 Click Score: 21   End of Session Equipment Utilized During Treatment: Gait belt;Rolling walker Nurse Communication: Other (comment) (IV)  Activity Tolerance: Patient tolerated treatment well Patient left: in chair;with call bell/phone within reach  OT Visit Diagnosis: Unsteadiness on feet (R26.81);Pain                Time: 1142-1200 OT Time Calculation (min): 18 min Charges:  OT General Charges $OT Visit: 1 Visit OT Evaluation $OT Eval Moderate Complexity: 1 Mod  06/20/2020  Rich, OTR/L  Acute Rehabilitation Services  Office:  [phone]   Metta Clines 06/20/2020, 12:08 PM

## 2020-06-20 NOTE — Progress Notes (Signed)
Due to Pt had been nauseated and vomited with large amount of yellow emesis. Pt denied abdominal distention, no guarding , but  Painful mostly from surgical incision. Bowel sound hypoactive from auscultation.  We will postpone to D/C Foley cath until MD reevaluated this am.   Madelaine Etienne, RN

## 2020-06-20 NOTE — Progress Notes (Signed)
PROGRESS NOTE                                                                                                                                                                                                             Patient Demographics:    Tammy Davies, is a 60 y.o. female, DOB - July 20, 1960, WXI:[id number]  Outpatient Primary MD for the patient is Patient, No Pcp Per   Admit date - 06/05/2020   LOS - 3  Chief Complaint  Patient presents with  . Abdominal Pain       Brief Narrative: Patient is a 60 y.o. female with PMHx of PAF on anticoagulation, OSA on CPAP, HTN-with diverticulitis not responsive to conservative treatment-subsequently underwent Hartman's procedure on 2/1.  See below for further details  Significant Events: 1/19>> Admit to [facility] for diverticulitis 2/1>> exploratory laparotomy, left colectomy and colostomy 2/1>> cystoscopy with insertion of bilateral ureteral stents  Antibiotics: Zosyn: 1/20>> 1/24, 2/1>> Ertapenem: 1/25>> 1/31  Microbiology data: None  Procedures: 2/1>> exploratory laparotomy, left colectomy and colostomy 2/1>> cystoscopy with insertion of bilateral ureteral stents  Consults: Surgery, urology  DVT prophylaxis: enoxaparin (LOVENOX) injection 40 mg Start: 06/19/20 1000 SCDs Start: 06/06/20 1952    Subjective:   Vomited last night-no major issues this morning.  Has loose ostomy output this morning.   Assessment  & Plan :   Acute complicated diverticulitis with abscess: Failed medical management-underwent laparotomy with left colectomy and end ostomy-along with cystoscopy and insertion of bilateral ureteral stents (phlegmon encroaching left ureter)-General surgery/urology following and directing care.  Remains on empiric Zosyn through 2/6.  PAF: Remains in atrial flutter-rate controlled with Cardizem gtt.-Per surgery-okay to resume IV heparin.  If no further  vomiting-suspect we could change to oral Cardizem.  HTN: Stable with Cardizem.  Normocytic anemia: Due to acute illness-no blood loss.  Transfuse if hemoglobin <7  COVID-19 infection: Incidental finding-does not require isolation any further.   COVID-19 Labs: No results for input(s): DDIMER, FERRITIN, LDH, CRP in the last 72 hours.     Component Value Date/Time   BNP 279.9 (H) 06/11/2020 0956    No results for input(s): PROCALCITON in the last 168 hours.  Lab Results  Component Value Date   SARSCOV2NAA POSITIVE (A) 06/06/2020      Nutrition Problem: Nutrition Problem: Inadequate oral intake Etiology: inability to  eat Signs/Symptoms: NPO status Interventions: Refer to RD note for recommendations  Obesity: Estimated body mass index is 36.2 kg/m as calculated from the following:   Height as of this encounter: 5' 8"  (1.727 m).   Weight as of this encounter: 108 kg.   RN pressure injury documentation:    ABG:    Component Value Date/Time   PHART 7.397 06/18/2020 1319   PCO2ART 42.2 06/18/2020 1319   PO2ART 77.1 (L) 06/18/2020 1319   HCO3 25.6 06/18/2020 1319   TCO2 24 06/18/2020 1102   ACIDBASEDEF 2.0 06/18/2020 1102   O2SAT 94.9 06/18/2020 1319    Vent Settings: N/A   Condition - Stable  Family Communication  :  Son- [phone] left voicemail on 2/3  Code Status :  Full Code  Diet :  Diet Order            Diet NPO time specified Except for: Sips with Meds, Ice Chips, Other (See Comments)  Diet effective now           Diet - low sodium heart healthy                  Disposition Plan  :   Status is: Inpatient  Remains inpatient appropriate because:Inpatient level of care appropriate due to severity of illness   Dispo: The patient is from: Home              Anticipated d/c is to: TBD              Anticipated d/c date is: > 3 days              Patient currently is not medically stable to d/c.   Difficult to place patient No   Barriers  to discharge: S/p Hartman's procedure-awaiting bowel function/advancing diet-not stable for discharge as of yet.  On TNA.  Antimicorbials  :    Anti-infectives (From admission, onward)   Start     Dose/Rate Route Frequency Ordered Stop   06/18/20 1730  piperacillin-tazobactam (ZOSYN) IVPB 3.375 g  Status:  Discontinued        3.375 g 12.5 mL/hr over 240 Minutes Intravenous Every 8 hours 06/18/20 1644 06/18/20 1644   06/18/20 1730  piperacillin-tazobactam (ZOSYN) IVPB 3.375 g        3.375 g 12.5 mL/hr over 240 Minutes Intravenous Every 8 hours 06/18/20 1644 06/23/20 1359   06/14/20 0000  ciprofloxacin (CIPRO) 500 MG tablet        500 mg Oral 2 times daily 06/14/20 1100 06/24/20 2359   06/14/20 0000  metroNIDAZOLE (FLAGYL) 500 MG tablet        500 mg Oral 3 times daily 06/14/20 1100 06/24/20 2359   06/11/20 1230  ertapenem (INVANZ) 1,000 mg in sodium chloride 0.9 % 100 mL IVPB  Status:  Discontinued        1 g 200 mL/hr over 30 Minutes Intravenous Every 24 hours 06/11/20 1114 06/18/20 1644   06/06/20 1850  piperacillin-tazobactam (ZOSYN) IVPB 3.375 g  Status:  Discontinued        3.375 g 12.5 mL/hr over 240 Minutes Intravenous Every 8 hours 06/06/20 1850 06/11/20 1114   06/06/20 1830  piperacillin-tazobactam (ZOSYN) IVPB 3.375 g        3.375 g 100 mL/hr over 30 Minutes Intravenous  Once 06/06/20 1818 06/06/20 2039      Inpatient Medications  Scheduled Meds: . Chlorhexidine Gluconate Cloth  6 each Topical Daily  . enoxaparin (LOVENOX) injection  40 mg Subcutaneous Q24H  . lip balm  1 application Topical BID  . polycarbophil  625 mg Oral BID   Continuous Infusions: . acetaminophen 1,000 mg (06/20/20 1216)  . diltiazem (CARDIZEM) infusion 5 mg/hr (06/20/20 1218)  . lactated ringers 20 mL/hr at 06/19/20 0600  . methocarbamol (ROBAXIN) IV 1,000 mg (06/20/20 0347)  . piperacillin-tazobactam (ZOSYN)  IV 3.375 g (06/20/20 0647)  . TPN ADULT (ION) 85 mL/hr at 06/19/20 1735  . TPN  ADULT (ION)     PRN Meds:.alum & mag hydroxide-simeth, diphenhydrAMINE, HYDROmorphone (DILAUDID) injection, Ipratropium-Albuterol, lactated ringers, magic mouthwash, methocarbamol (ROBAXIN) IV, metoprolol tartrate, ondansetron (ZOFRAN) IV, prochlorperazine, sodium chloride flush   Time Spent in minutes  25  See all Orders from today for further details   Oren Binet M.D on 06/20/2020 at 12:23 PM  To page go to [email or web address] - use universal password  [facility] -  Office  [phone]    Objective:   Vitals:   06/20/20 0027 06/20/20 0350 06/20/20 0755 06/20/20 1146  BP: 113/74 (!) 150/106 119/68 113/61  Pulse: 76 96 77 78  Resp: 13 20 14 16   Temp:  98.9 F (37.2 C) 98.6 F (37 C) 98.4 F (36.9 C)  TempSrc: Oral Oral Oral Oral  SpO2: 97% 94% 98% 99%  Weight:      Height:        Wt Readings from Last 3 Encounters:  06/18/20 108 kg  05/09/20 111.1 kg  03/27/19 111.9 kg     Intake/Output Summary (Last 24 hours) at 06/20/2020 1223 Last data filed at 06/20/2020 1030 Gross per 24 hour  Intake 2244.76 ml  Output 4195 ml  Net -1950.24 ml     Physical Exam Gen Exam:Alert awake-not in any distress HEENT:atraumatic, normocephalic Chest: B/L clear to auscultation anteriorly CVS:S1S2 regular Abdomen:soft non tender, non distended Extremities:no edema Neurology: Non focal Skin: no rash  Data Review:    CBC Recent Labs  Lab 06/15/20 0515 06/16/20 0406 06/17/20 0400 06/18/20 0332 06/18/20 1102 06/18/20 1319 06/19/20 0436 06/20/20 0400  WBC 6.5   < > 5.6 4.5  --  13.0* 12.3* 10.9*  HGB 11.0*   < > 10.2* 9.9* 9.5* 10.0* 8.8* 9.4*  HCT 36.2   < > 33.9* 31.7* 28.0* 32.6* 28.9* 29.3*  PLT 285   < > 170 120*  --  141* 138* 153  MCV 86.4   < > 87.4 86.6  --  86.9 86.3 85.4  MCH 26.3   < > 26.3 27.0  --  26.7 26.3 27.4  MCHC 30.4   < > 30.1 31.2  --  30.7 30.4 32.1  RDW 16.3*   < > 16.1* 16.1*  --  16.0* 15.9* 16.6*  LYMPHSABS 0.9  --  1.1  --   --   --    --   --   MONOABS 0.4  --  0.5  --   --   --   --   --   EOSABS 0.0  --  0.1  --   --   --   --   --   BASOSABS 0.0  --  0.0  --   --   --   --   --    < > = values in this interval not displayed.    Chemistries  Recent Labs  Lab 06/15/20 0515 06/16/20 0406 06/17/20 0400 06/18/20 0332 06/18/20 1102 06/18/20 1319 06/19/20 0436 06/20/20 0400  NA 136 136 135 135 138 133*  132* 134*  K 4.0 4.0 3.5 4.1 4.0 4.1 4.8 4.3  CL 100 101 99 100  --  101 99 96*  CO2 27 24 29 28   --  23 26 29   GLUCOSE 114* 107* 114* 101*  --  217* 136* 137*  BUN 5* 6 5* 7  --  9 14 15   CREATININE 0.90 0.85 0.78 0.76  --  0.82 0.74 0.88  CALCIUM 9.4 8.7* 8.8* 8.6*  --  8.3* 8.3* 8.8*  MG  --   --  1.8 2.0  --   --  2.0 1.9  AST 17 20 15   --   --   --   --  19  ALT 24 22 19   --   --   --   --  20  ALKPHOS 110 95 87  --   --   --   --  88  BILITOT 0.6 0.6 0.7  --   --   --   --  0.7   ------------------------------------------------------------------------------------------------------------------ Recent Labs    06/19/20 0436  TRIG 77    Lab Results  Component Value Date   HGBA1C 5.6 06/07/2020   ------------------------------------------------------------------------------------------------------------------ No results for input(s): TSH, T4TOTAL, T3FREE, THYROIDAB in the last 72 hours.  Invalid input(s): FREET3 ------------------------------------------------------------------------------------------------------------------ No results for input(s): VITAMINB12, FOLATE, FERRITIN, TIBC, IRON, RETICCTPCT in the last 72 hours.  Coagulation profile No results for input(s): INR, PROTIME in the last 168 hours.  No results for input(s): DDIMER in the last 72 hours.  Cardiac Enzymes No results for input(s): CKMB, TROPONINI, MYOGLOBIN in the last 168 hours.  Invalid input(s): CK ------------------------------------------------------------------------------------------------------------------     Component Value Date/Time   BNP 279.9 (H) 06/11/2020 2409    Micro Results No results found for this or any previous visit (from the past 240 hour(s)).  Radiology Reports DG Abd 1 View  Result Date: 06/20/2020 CLINICAL DATA:  Nausea and vomiting.  Recent abdominal surgery. EXAM: ABDOMEN - 1 VIEW COMPARISON:  CT 06/15/2020.  Abdomen series 06/15/2020. FINDINGS: Surgical clips right upper quadrant. Drainage catheter noted over the pelvis. Catheter noted over the mid abdomen. Stomach is nondistended. Slightly prominent air-filled loops of small bowel noted. Colon is nondistended. Mild adynamic ileus could present in this fashion. Follow-up exam suggested to exclude developing bowel obstruction. Hemidiaphragms incompletely imaged. No free air identified. Degenerative change and scoliosis lumbar spine and both hips. Pelvic calcifications consistent phleboliths. IMPRESSION: Mildly prominent air-filled loops of small bowel noted. Colon is nondistended. Mild adynamic ileus could present in this fashion. Follow-up exam suggested to exclude developing bowel obstruction. Electronically Signed   By: Marcello Moores  Register   On: 06/20/2020 06:32   CT ABDOMEN PELVIS W CONTRAST  Result Date: 06/15/2020 CLINICAL DATA:  Abdominal distension.  COVID positive. EXAM: CT ABDOMEN AND PELVIS WITH CONTRAST TECHNIQUE: Multidetector CT imaging of the abdomen and pelvis was performed using the standard protocol following bolus administration of intravenous contrast. CONTRAST:  186m OMNIPAQUE IOHEXOL 300 MG/ML  SOLN COMPARISON:  June 10, 2020 FINDINGS: Lower chest: There are small bilateral pleural effusions with adjacent atelectasis.The heart size is normal. 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 There is a similar hypoattenuating area adjacent to the gallbladder fossa. The patient is status post prior cholecystectomy.There is no biliary ductal dilation. Pancreas: Normal contours without  ductal dilatation. No peripancreatic fluid collection. Spleen: Unremarkable. Adrenals/Urinary Tract: --Adrenal glands: Unremarkable. --Right kidney/ureter: No hydronephrosis or radiopaque kidney stones. --Left kidney/ureter: No hydronephrosis  or radiopaque kidney stones. --Urinary bladder: Unremarkable. Stomach/Bowel: --Stomach/Duodenum: No hiatal hernia or other gastric abnormality. Normal duodenal course and caliber. --Small bowel: Unremarkable. --Colon: Again noted are extensive inflammatory changes of the sigmoid colon consistent with sigmoid diverticulitis. These changes have substantially worsened since the prior study. There are now appears to be some degree of obstruction at this level as evidence by mildly dilated upstream colon with air-fluid levels. There are scattered colonic diverticula throughout the remaining portions of the colon. There is no abscess. There is no free air. --Appendix: Normal. Vascular/Lymphatic: Atherosclerotic calcification is present within the non-aneurysmal abdominal aorta, without hemodynamically significant stenosis. --No retroperitoneal lymphadenopathy. --No mesenteric lymphadenopathy. --No pelvic or inguinal lymphadenopathy. Reproductive: Unremarkable Other: No ascites or free air. The abdominal wall is normal. Musculoskeletal. No acute displaced fractures. IMPRESSION: 1. Worsening sigmoid diverticulitis, still without evidence for perforation or abscess formation. There now appears to be some degree of obstruction as evidence by dilatation of the upstream colon now with associated air-fluid levels. 2. Growing small bilateral pleural effusions. 3. Additional chronic findings as detailed above. Aortic Atherosclerosis (ICD10-I70.0). Electronically Signed   By: Constance Holster M.D.   On: 06/15/2020 17:09   CT ABDOMEN PELVIS W CONTRAST  Result Date: 06/10/2020 CLINICAL DATA:  60 year old female with abdominal pain. Concern for acute diverticulitis. EXAM: CT ABDOMEN AND  PELVIS WITH CONTRAST TECHNIQUE: Multidetector CT imaging of the abdomen and pelvis was performed using the standard protocol following bolus administration of intravenous contrast. CONTRAST:  139m OMNIPAQUE IOHEXOL 300 MG/ML  SOLN COMPARISON:  CT abdomen pelvis dated 06/06/2020. FINDINGS: Lower chest: Partially visualized small bilateral pleural effusions, new since the prior CT. There is associated partial compressive atelectasis of the lower lobes. Pneumonia is less likely but not excluded clinical correlation is recommended. No intra-abdominal free air. Small free fluid in the pelvis. Hepatobiliary: Fatty liver. Large right hepatic hemangioma better characterized on the prior MRI. No intrahepatic biliary ductal dilatation. Cholecystectomy. Stable appearance of hypoenhancing area in the left lobe of the liver superior to the cholecystectomy. Pancreas: Unremarkable. No pancreatic ductal dilatation or surrounding inflammatory changes. Spleen: Normal in size without focal abnormality. Adrenals/Urinary Tract: The adrenal glands unremarkable. The kidneys, visualized ureters, and urinary bladder appear unremarkable. Stomach/Bowel: There is sigmoid diverticulosis with diffusely thickened and inflamed sigmoid colon most likely acute diverticulitis, and less likely colitis. Clinical correlation is recommended. Overall no significant improvement in the inflammatory changes of the sigmoid colon compared to prior CT. Small amount of partially loculated fluid or phlegmon to the left of the sigmoid colon as seen on the prior CT. No discrete drainable fluid collection or abscess identified. There is a 2.2 x 1.8 cm ill-defined inflammatory area inferior to the sigmoid colon (76/3). No extraluminal air. There is no bowel obstruction. The appendix is normal. Vascular/Lymphatic: Mild aortoiliac atherosclerotic disease. The IVC is unremarkable. No portal venous gas. There is no adenopathy. Reproductive: The uterus is anteverted and  grossly unremarkable. No adnexal masses. Other: Midline vertical anterior pelvic wall incisional scar. Musculoskeletal: Osteopenia with degenerative changes of the spine. No acute osseous pathology. IMPRESSION: 1. Persistent sigmoid diverticulitis. No drainable fluid collection or abscess. 2. Partially visualized small bilateral pleural effusions, new since the prior CT. 3. Fatty liver. Large right hepatic hemangioma better characterized on the prior MRI. 4. Aortic Atherosclerosis (ICD10-I70.0). Electronically Signed   By: AAnner CreteM.D.   On: 06/10/2020 16:22   CT ABDOMEN PELVIS W CONTRAST  Result Date: 06/06/2020 CLINICAL DATA:  Diverticulitis, constipation, worsening pain  EXAM: CT ABDOMEN AND PELVIS WITH CONTRAST TECHNIQUE: Multidetector CT imaging of the abdomen and pelvis was performed using the standard protocol following bolus administration of intravenous contrast. CONTRAST:  14m OMNIPAQUE IOHEXOL 300 MG/ML  SOLN COMPARISON:  CT abdomen pelvis, 05/29/2020, abdominal MR, 08/15/2016 FINDINGS: Lower chest: No acute abnormality. 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 Status post cholecystectomy. No biliary ductal dilatation. Pancreas: Unremarkable. No pancreatic ductal dilatation or surrounding inflammatory changes. Spleen: Normal in size without significant abnormality. Adrenals/Urinary Tract: Adrenal glands are unremarkable. Kidneys are normal, without renal calculi, solid lesion, or hydronephrosis. Bladder is unremarkable. Stomach/Bowel: Stomach is within normal limits. Incidental diverticula of the descending portion of the duodenum. Appendix appears normal. Sigmoid diverticulosis with redemonstrated long segment wall thickening and adjacent fat stranding about the mid to distal sigmoid colon. Suspect a  small phlegmon or abscess just inferior to the distal sigmoid colon, closely abutting the posterior aspect of the lower uterine segment, measuring approximately 3.4 x 3.3 x 2.7 cm (series 3, image 74, series 7, image 76). Unchanged small volume of fluid posterior to the midportion of the sigmoid measuring approximately 3.4 x 1.7 cm (series 3, image 70). Vascular/Lymphatic: No significant vascular findings are present. No enlarged abdominal or pelvic lymph nodes. Reproductive: No mass or other significant abnormality. Other: No abdominal wall hernia or abnormality. No abdominopelvic ascites. Musculoskeletal: No acute or significant osseous findings. IMPRESSION: 1. Sigmoid diverticulosis with redemonstrated long segment wall thickening and adjacent fat stranding about the mid to distal sigmoid colon, consistent with acute diverticulitis. 2. Suspect a small phlegmon or abscess just inferior to the distal sigmoid colon, closely abutting the posterior aspect of the lower uterine segment, measuring approximately 3.4 x 3.3 x 2.7 cm, which is more defined appearing than on prior examination. 3. Unchanged small volume of fluid posterior to the midportion of the sigmoid, which is not clearly loculated. 4. 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 Electronically Signed   By: AEddie CandleM.D.   On: 06/06/2020 15:01   CT ABDOMEN PELVIS W CONTRAST  Addendum Date: 05/29/2020   ADDENDUM REPORT: 05/29/2020 12:53 ADDENDUM: This case was discussed with Dr. DRoslynn Amblefollowing its interpretation. He reports that there was small volume contrast extravasation at the IV site during the injection for the CT which had not been reported in the technologist notes. Technologist indicates this was approximately 10 cc of contrast. Small extravasation volumes of contrast are typically without consequence. Recommend  elevation of the extremity with intermittent cold and hot compresses. Electronically Signed   By: WRichardean SaleM.D.   On: 05/29/2020 12:53   Result Date: 05/29/2020 CLINICAL DATA:  Constipation for 1 month with left lower quadrant abdominal pain. Diverticulitis suspected. EXAM: CT ABDOMEN AND PELVIS WITH CONTRAST TECHNIQUE: Multidetector CT imaging of the abdomen and pelvis was performed using the standard protocol following bolus administration of intravenous contrast. CONTRAST:  879mOMNIPAQUE IOHEXOL 300 MG/ML  SOLN COMPARISON:  Abdominal MRI 08/15/2016. FINDINGS: Lower chest: Clear lung bases. No significant pleural or pericardial effusion. Hepatobiliary: Mass involving segments 6 and 7 previously characterized as a hemangioma on MRI is unchanged in size, measuring 8.3 x 6.6 cm on image 28/3. There is focal fat superior to the cholecystectomy bed. No new or enlarging hepatic  lesions. No significant biliary dilatation post cholecystectomy. Pancreas: Unremarkable. No pancreatic ductal dilatation or surrounding inflammatory changes. Spleen: Normal in size without focal abnormality. Adrenals/Urinary Tract: Both adrenal glands appear normal. The kidneys appear normal without evidence of urinary tract calculus, suspicious lesion or hydronephrosis. No bladder abnormalities are seen. Stomach/Bowel: No enteric contrast was administered. The stomach and small bowel appear normal aside from a small proximal duodenal diverticulum. The appendix and proximal colon appear normal. There is long segment wall thickening of the sigmoid colon associated with diverticulosis and surrounding inflammatory changes, consistent with acute diverticulitis. There is a small amount of pericolonic fluid on the left measuring 3.5 x 1.5 cm on image 74/3, but no well-defined extraluminal fluid collection to suggest an abscess. No evidence of bowel obstruction or free air. Vascular/Lymphatic: There are no enlarged abdominal or pelvic lymph  nodes. Minimal aortoiliac atherosclerosis without acute vascular findings. The portal, superior mesenteric and splenic veins are patent. Reproductive: The uterus and ovaries appear normal. No adnexal mass. Other: As above, inflammatory changes in the pelvis with a small extraluminal fluid collection lateral to the sigmoid colon. No ascites or free air. Musculoskeletal: No acute or significant osseous findings. Lumbar spondylosis noted. IMPRESSION: 1. Findings are consistent with acute sigmoid diverticulitis. There is a small amount of pericolonic fluid on the left, but no well-defined extraluminal fluid collection to suggest an abscess. No evidence of bowel obstruction or free air. 2. Stable hepatic hemangioma. 3. Aortic Atherosclerosis (ICD10-I70.0). Electronically Signed: By: Richardean Sale M.D. On: 05/29/2020 12:06   DG Retrograde Pyelogram  Result Date: 06/18/2020 CLINICAL DATA:  Bilateral stent placement with retrograde pyelogram. EXAM: INTRAOPERATIVE BILATERAL RETROGRADE UROGRAPHY TECHNIQUE: Images were obtained with the C-arm fluoroscopic device intraoperatively and submitted for interpretation post-operatively. Please see the procedural report for the amount of contrast and the fluoroscopy time utilized. COMPARISON:  CT abdomen pelvis 06/15/2020 FINDINGS: Five intraoperative fluoroscopic images are submitted for interpretation. There is mild irregular narrowing of the distal right ureter. Rounded filling defect in the distal left ureter most likely air bubble or debris. Bilateral ureteral stents are noted. IMPRESSION: Intraoperative fluoroscopic images of retrograde urography as above. Electronically Signed   By: Miachel Roux M.D.   On: 06/18/2020 11:10   DG CHEST PORT 1 VIEW  Result Date: 06/16/2020 CLINICAL DATA:  PICC line placement EXAM: PORTABLE CHEST 1 VIEW COMPARISON:  06/11/2020 FINDINGS: Normal cardiac silhouette. PICC line pace with tip in distal SVC. Patchy airspace markings. No  pneumothorax. No focal consolidation. IMPRESSION: RIGHT PICC line placed without complication. Patchy airspace opacities in lower lobes. Electronically Signed   By: Suzy Bouchard M.D.   On: 06/16/2020 14:16   DG Chest Port 1 View  Result Date: 06/11/2020 CLINICAL DATA:  Dyspnea.  COVID positive EXAM: PORTABLE CHEST 1 VIEW COMPARISON:  10/11/2017 FINDINGS: Cardiac enlargement without heart failure. Lungs are clear without infiltrate or effusion. IMPRESSION: No active disease. Electronically Signed   By: Franchot Gallo M.D.   On: 06/11/2020 10:22   DG ABD ACUTE 2+V W 1V CHEST  Result Date: 06/15/2020 CLINICAL DATA:  Perforated abdominal viscus EXAM: DG ABDOMEN ACUTE WITH 1 VIEW CHEST COMPARISON:  Chest x-ray 06/11/2020, CT abdomen pelvis 06/10/2020 FINDINGS: There is no evidence of dilated bowel loops or free intraperitoneal air. No radiopaque calculi or other significant radiographic abnormality is seen. Surgical clips overlie the right upper quadrant. The heart size and mediastinal contours are unchanged. Bibasilar compressive changes. No definite focal consolidation. No pulmonary edema. No pleural effusion. No  pneumothorax. No acute osseous abnormality. IMPRESSION: Negative abdominal radiographs.  No acute cardiopulmonary disease. Electronically Signed   By: Iven Finn M.D.   On: 06/15/2020 04:06   DG ABD ACUTE 2+V W 1V CHEST  Result Date: 06/14/2020 CLINICAL DATA:  Abdominal pain and emesis EXAM: DG ABDOMEN ACUTE WITH 1 VIEW CHEST COMPARISON:  Chest radiograph June 11, 2020; CT abdomen and pelvis June 10, 2020 FINDINGS: AP chest: There is no edema or airspace opacity. Heart is borderline enlarged with pulmonary vascularity normal. No adenopathy. Supine and upright abdomen: There is moderate stool in the colon. There is no bowel dilatation or air-fluid level to suggest bowel obstruction. No free air. There are surgical clips in the right upper abdomen. IMPRESSION: No bowel obstruction or  free air evident. Moderate stool in colon. No edema or airspace opacity. Borderline cardiac enlargement. Electronically Signed   By: Lowella Grip III M.D.   On: 06/14/2020 10:48   [place] EKG SITE RITE  Result Date: 06/16/2020 If Site Rite image not attached, placement could not be confirmed due to current cardiac rhythm.  [place] EKG SITE RITE  Result Date: 06/16/2020 If Site Rite image not attached, placement could not be confirmed due to current cardiac rhythm.

## 2020-06-20 NOTE — Progress Notes (Signed)
Nutrition Follow-up  DOCUMENTATION CODES:   Obesity unspecified  INTERVENTION:   Continue TPN to meet 100% of needs  Once diet advanced:   Boost Breeze po TID, each supplement provides 250 kcal and 9 grams of protein  30 ml ProSource Plus BID, each supplement provides 100 kcals and 15 grams protein.   NUTRITION DIAGNOSIS:   Inadequate oral intake related to inability to eat as evidenced by NPO status.  Ongoing  GOAL:   Patient will meet greater than or equal to 90% of their needs  Meeting with TPN  MONITOR:   Skin,Weight trends,Labs,I & O's,Diet advancement  REASON FOR ASSESSMENT:   Consult New TPN/TNA  ASSESSMENT:   60 year old with history of paroxysmal A. fib on anticoagulation, tobacco use, obstructive sleep apnea on CPAP, HTN admitted for abdominal pain. CT abdomen pelvis showed sigmoid diverticulitis with small abscess. Pt COVID positive.   1/29- epigastric pain, vomiting, NPO 1/30- start TPN 2/01- ex lap with L colectomy, end colostomy and small bowel resection 2/02- clear liquid diet  Patient reports taking sips from the unit all day yesterday. Noticed nausea later in the evening and experienced subsequent vomiting all night. She was made NPO. Having small amounts of liquid output in colostomy. KUB this am showed mild adynamic ileus.   She continues to receive TPN at 85 ml/hr to provide 109 g protein and 1824 kcal (91% of kcal needs). She is currently day 5 without lipids. After 7 days lipids will be started on MWF schedule. Continue TPN to meet all nutrition needs until diet is advanced and PO intake remains consistent.   Admission weight: 111.1 kg  Current weight: 108 kg   UOP: 1600 ml x 24 hrs  JP drain (R): 215 ml x 24 hrs  Colostomy: 100 ml x 24 hrs   Medications: fibercon BID Labs: Na 134 (L) CBG 120-132  Diet Order:   Diet Order            Diet NPO time specified Except for: Sips with Meds, Ice Chips, Other (See Comments)  Diet effective  now           Diet - low sodium heart healthy                 EDUCATION NEEDS:   Not appropriate for education at this time  Skin:  Skin Assessment: Skin Integrity Issues: Skin Integrity Issues:: Incisions Incisions: abdomen  Last BM:  2/3  Height:   Ht Readings from Last 1 Encounters:  06/18/20 5' 8"  (1.727 m)    Weight:   Wt Readings from Last 1 Encounters:  06/18/20 108 kg    BMI:  Body mass index is 36.2 kg/m.  Estimated Nutritional Needs:   Kcal:  2000-2200  Protein:  100-110 grams  Fluid:  >/= 2 L/day  Mariana Single RD, LDN Clinical Nutrition Pager listed in [place]

## 2020-06-20 NOTE — Progress Notes (Signed)
ANTICOAGULATION CONSULT NOTE - Follow Up Consult  Pharmacy Consult for IV Heparin Indication: atrial fibrillation  No Known Allergies  Patient Measurements: Height: 5' 8"  (172.7 cm) Weight: 108 kg (238 lb 1.6 oz) IBW/kg (Calculated) : 63.9 Heparin Dosing Weight: 89 kg  Vital Signs: Temp: 98.4 F (36.9 C) (02/03 1146) Temp Source: Oral (02/03 1146) BP: 113/61 (02/03 1146) Pulse Rate: 78 (02/03 1146)  Labs: Recent Labs    06/18/20 1319 06/19/20 0436 06/20/20 0400  HGB 10.0* 8.8* 9.4*  HCT 32.6* 28.9* 29.3*  PLT 141* 138* 153  CREATININE 0.82 0.74 0.88    Estimated Creatinine Clearance: 88.6 mL/min (by C-G formula based on SCr of 0.88 mg/dL).   Medical History: Past Medical History:  Diagnosis Date  . Dysrhythmia   . History of COVID-19   . PAF (paroxysmal atrial fibrillation) ([place])   . Tobacco abuse     Assessment: Pt was recently discharged from [facility] for diverticulitis with possible abscess. Pt now has epigastric pain with worsening vomiting. She is on chronic apixaban for afib (last dose on 1/19). Plan is to hold apixaban and bridge with IV heparin. Pharmacy was consulted to restart IV heparin now cleared from surgical team.    2/1 S/p Ex-lap, left colectomy, end colostomy, SBR, drain placement. Hgb improved from 8.8 to 9.4, hematuria resolved.   SQ lovenox 70m given ~0800 this morning.   Goal of Therapy:  Heparin level 0.3-0.7 units/ml Monitor platelets by anticoagulation protocol: Yes   Plan:  Restart heparin infusion at 2300 units/hr, will need to watch closely for s/s of bleeding and follow daily CBC.    FErin HearingPharmD., BCPS Clinical Pharmacist 06/20/2020 12:50 PM

## 2020-06-20 NOTE — Progress Notes (Signed)
ANTICOAGULATION CONSULT NOTE - Follow Up Consult  Pharmacy Consult for IV Heparin Indication: atrial fibrillation  No Known Allergies  Patient Measurements: Height: 5' 8"  (172.7 cm) Weight: 108 kg (238 lb 1.6 oz) IBW/kg (Calculated) : 63.9 Heparin Dosing Weight: 89 kg  Vital Signs: Temp: 99.7 F (37.6 C) (02/03 2005) Temp Source: Oral (02/03 2005) BP: 118/99 (02/03 2005) Pulse Rate: 86 (02/03 2005)  Labs: Recent Labs    06/18/20 1319 06/19/20 0436 06/20/20 0400 06/20/20 2130  HGB 10.0* 8.8* 9.4*  --   HCT 32.6* 28.9* 29.3*  --   PLT 141* 138* 153  --   HEPARINUNFRC  --   --   --  0.52  CREATININE 0.82 0.74 0.88  --     Estimated Creatinine Clearance: 88.6 mL/min (by C-G formula based on SCr of 0.88 mg/dL).   Assessment: Pt was recently discharged from [facility] for diverticulitis with possible abscess. Pt now has epigastric pain with worsening vomiting. She is on chronic apixaban for afib (last dose on 1/19). Plan is to hold apixaban and bridge with IV heparin. Pharmacy was consulted to restart IV heparin now cleared from surgical team.   2/1 S/p Ex-lap, left colectomy, end colostomy, SBR, drain placement.   Heparin level therapeutic (0.52) on gtt at 2300 units/hr. No bleeding noted. Hgb stable 9.4.  Goal of Therapy:  Heparin level 0.3-0.7 units/ml Monitor platelets by anticoagulation protocol: Yes   Plan:  Continue heparin infusion at 2300 units/hr F/u daily heparin level and CBC  Sherlon Handing, PharmD, BCPS Please see amion for complete clinical pharmacist phone list 06/20/2020 10:45 PM

## 2020-06-20 NOTE — Progress Notes (Signed)
Physical Therapy Treatment Patient Details Name: Tammy Davies MRN: [id number] DOB: [birth year] Today's Date: 06/20/2020    History of Present Illness Pt is a 60 y.o. F with significant PMH of paroxysmal A. fib on anticoaguation, tobacco use, HTN, admitted for abdominal pain. CT abdomen pelvis showed sigmoid diverticulitis with small abscess. Patient went for exploratory laparotomy with left colectomy, end colostomy and small bowel resection by Dr. Donne Hazel 06/18/2020.    PT Comments    Pt received in supine, agreeable to therapy session and with good participation and tolerance for mobility. Pt able to progress gait distance to 2102f using RW and Supervision and needed Supervision to min guard for bed mobility/transfers. Will plan to teach HEP program and trial stairs next session. Pt continues to benefit from PT services to progress toward functional mobility goals. Anticipate discharge home once medically cleared.   Follow Up Recommendations  No PT follow up;Supervision for mobility/OOB     Equipment Recommendations  Rolling walker with 5" wheels    Recommendations for Other Services       Precautions / Restrictions Precautions Precautions: Fall;Other (comment) Precaution Comments: colostomy, wound vac, JP drain    Mobility  Bed Mobility Overal bed mobility: Needs Assistance Bed Mobility: Rolling;Sidelying to Sit Rolling: Supervision Sidelying to sit: Supervision       General bed mobility comments: Cues for log roll technique, use of bed rail; to L EOB  Transfers Overall transfer level: Needs assistance Equipment used: Rolling walker (2 wheeled) Transfers: Sit to/from Stand Sit to Stand: Supervision;Min guard         General transfer comment: from EOB and raised toilet seat heights to RW; min guard for stand>sit to chair, cues for safety needed  Ambulation/Gait Ambulation/Gait assistance: Supervision Gait Distance (Feet): 200 Feet Assistive device: Rolling  walker (2 wheeled) Gait Pattern/deviations: Step-through pattern;Decreased stride length Gait velocity: decreased; ~0.4 m/s   General Gait Details: Slow and steady pace, cues for walker use, min guard while turning in bathroom due to lines but mostly Supervision for safety   Stairs             Wheelchair Mobility    Modified Rankin (Stroke Patients Only)       Balance Overall balance assessment: Needs assistance Sitting-balance support: Feet supported Sitting balance-Leahy Scale: Good     Standing balance support: Bilateral upper extremity supported Standing balance-Leahy Scale: Poor Standing balance comment: U to BUE support needed, RW to reduce incisional discomfort                            Cognition Arousal/Alertness: Awake/alert Behavior During Therapy: WFL for tasks assessed/performed Overall Cognitive Status: Within Functional Limits for tasks assessed                                 General Comments: motivated, participatory      Exercises      General Comments General comments (skin integrity, edema, etc.): pouch ~30% full but some air also in pouch, RN notified pt may need air expressed/pouch emptied soon, pt may need assist for this. Pt remained up in chair end of session      Pertinent Vitals/Pain Pain Assessment: 0-10 Pain Score: 7  Pain Location: incisional Pain Descriptors / Indicators: Discomfort;Guarding;Grimacing Pain Intervention(s): Monitored during session;Repositioned;Patient requesting pain meds-RN notified    Home Living  Prior Function            PT Goals (current goals can now be found in the care plan section) Acute Rehab PT Goals Patient Stated Goal: return home and to work PT Goal Formulation: With patient Time For Goal Achievement: 07/03/20 Potential to Achieve Goals: Good Progress towards PT goals: Progressing toward goals    Frequency    Min 3X/week       PT Plan Current plan remains appropriate    Co-evaluation              AM-PAC PT "6 Clicks" Mobility   Outcome Measure  Help needed turning from your back to your side while in a flat bed without using bedrails?: None Help needed moving from lying on your back to sitting on the side of a flat bed without using bedrails?: A Little Help needed moving to and from a bed to a chair (including a wheelchair)?: A Little Help needed standing up from a chair using your arms (e.g., wheelchair or bedside chair)?: A Little Help needed to walk in hospital room?: A Little Help needed climbing 3-5 steps with a railing? : A Lot 6 Click Score: 18    End of Session   Activity Tolerance: Patient tolerated treatment well Patient left: in chair;with call bell/phone within reach Nurse Communication: Mobility status PT Visit Diagnosis: Pain;Difficulty in walking, not elsewhere classified (R26.2) Pain - part of body:  (abdomen)     Time: 2542-7062 PT Time Calculation (min) (ACUTE ONLY): 24 min  Charges:  $Gait Training: 8-22 mins $Therapeutic Activity: 8-22 mins                     Tammy Davies P., PTA Acute Rehabilitation Services Pager: [phone] Office: [place] 06/20/2020, 4:26 PM

## 2020-06-20 NOTE — Progress Notes (Signed)
2 Days Post-Op  Subjective: CC: Patient reports that yesterday after breakfast she started having burping/belching along with nausea. Had 2-3 episodes of emesis yesterday. She reports this is better this am but has already received zofran at 0343 and compazine at 0535. She has not had anything to drink this am. She did start having brown liquid stool from her colostomy along with gas yesterday. Foley is still in place and urine is now yellow and transparent (previously bloody intra-op). She is mobilizing and worked with PT yesterday (60f, min guard, with RW). She is having some abdominal pain that is mainly around her midline wound and well controlled with medications currently. Her midline wound was vac'd yesterday by WOCN.   Objective: Vital signs in last 24 hours: Temp:  [98.2 F (36.8 C)-99.1 F (37.3 C)] 98.6 F (37 C) (02/03 0755) Pulse Rate:  [75-96] 77 (02/03 0755) Resp:  [10-20] 14 (02/03 0755) BP: (111-150)/(65-106) 119/68 (02/03 0755) SpO2:  [94 %-100 %] 98 % (02/03 0755) Last BM Date: 06/20/20  Intake/Output from previous day: 02/02 0701 - 02/03 0700 In: 2154.8 [P.O.:560; I.V.:1196.1; IV Piggyback:398.7] Out: 1915 [Urine:1600; Drains:215; Stool:100] Intake/Output this shift: No intake/output data recorded.  PE: Gen:  Alert, NAD, pleasant Card:  Reg rate Pulm:  CTAB, no W/R/R, effort normal Abd: Soft, mild to no distension, appropriately tender around midline wound - otherwise NT. Slightly hypoactive to normoactive BS. Stoma pink, budded and viable. Colostomy bag with air and liquid brown stool in bag. JP drain with SS output. Midline wound with vac in place, scant output in cannister.  Ext:  No LE edema  Psych: A&Ox3  Skin: no rashes noted, warm and dry  Lab Results:  Recent Labs    06/19/20 0436 06/20/20 0400  WBC 12.3* 10.9*  HGB 8.8* 9.4*  HCT 28.9* 29.3*  PLT 138* 153   BMET Recent Labs    06/19/20 0436 06/20/20 0400  NA 132* 134*  K 4.8 4.3   CL 99 96*  CO2 26 29  GLUCOSE 136* 137*  BUN 14 15  CREATININE 0.74 0.88  CALCIUM 8.3* 8.8*   PT/INR No results for input(s): LABPROT, INR in the last 72 hours. CMP     Component Value Date/Time   NA 134 (L) 06/20/2020 0400   NA 141 07/19/2019 0840   K 4.3 06/20/2020 0400   CL 96 (L) 06/20/2020 0400   CO2 29 06/20/2020 0400   GLUCOSE 137 (H) 06/20/2020 0400   BUN 15 06/20/2020 0400   BUN 13 07/19/2019 0840   CREATININE 0.88 06/20/2020 0400   CREATININE 0.88 09/09/2016 1146   CALCIUM 8.8 (L) 06/20/2020 0400   PROT 5.7 (L) 06/20/2020 0400   ALBUMIN 2.7 (L) 06/20/2020 0400   AST 19 06/20/2020 0400   ALT 20 06/20/2020 0400   ALKPHOS 88 06/20/2020 0400   BILITOT 0.7 06/20/2020 0400   GFRNONAA >60 06/20/2020 0400   GFRNONAA 74 09/09/2016 1146   GFRAA 105 07/19/2019 0840   GFRAA 86 09/09/2016 1146   Lipase     Component Value Date/Time   LIPASE 19 06/16/2020 0406       Studies/Results: DG Abd 1 View  Result Date: 06/20/2020 CLINICAL DATA:  Nausea and vomiting.  Recent abdominal surgery. EXAM: ABDOMEN - 1 VIEW COMPARISON:  CT 06/15/2020.  Abdomen series 06/15/2020. FINDINGS: Surgical clips right upper quadrant. Drainage catheter noted over the pelvis. Catheter noted over the mid abdomen. Stomach is nondistended. Slightly prominent air-filled loops of small bowel  noted. Colon is nondistended. Mild adynamic ileus could present in this fashion. Follow-up exam suggested to exclude developing bowel obstruction. Hemidiaphragms incompletely imaged. No free air identified. Degenerative change and scoliosis lumbar spine and both hips. Pelvic calcifications consistent phleboliths. IMPRESSION: Mildly prominent air-filled loops of small bowel noted. Colon is nondistended. Mild adynamic ileus could present in this fashion. Follow-up exam suggested to exclude developing bowel obstruction. Electronically Signed   By: Marcello Moores  Register   On: 06/20/2020 06:32   DG Retrograde  Pyelogram  Result Date: 06/18/2020 CLINICAL DATA:  Bilateral stent placement with retrograde pyelogram. EXAM: INTRAOPERATIVE BILATERAL RETROGRADE UROGRAPHY TECHNIQUE: Images were obtained with the C-arm fluoroscopic device intraoperatively and submitted for interpretation post-operatively. Please see the procedural report for the amount of contrast and the fluoroscopy time utilized. COMPARISON:  CT abdomen pelvis 06/15/2020 FINDINGS: Five intraoperative fluoroscopic images are submitted for interpretation. There is mild irregular narrowing of the distal right ureter. Rounded filling defect in the distal left ureter most likely air bubble or debris. Bilateral ureteral stents are noted. IMPRESSION: Intraoperative fluoroscopic images of retrograde urography as above. Electronically Signed   By: Miachel Roux M.D.   On: 06/18/2020 11:10    Anti-infectives: Anti-infectives (From admission, onward)   Start     Dose/Rate Route Frequency Ordered Stop   06/18/20 1730  piperacillin-tazobactam (ZOSYN) IVPB 3.375 g  Status:  Discontinued        3.375 g 12.5 mL/hr over 240 Minutes Intravenous Every 8 hours 06/18/20 1644 06/18/20 1644   06/18/20 1730  piperacillin-tazobactam (ZOSYN) IVPB 3.375 g        3.375 g 12.5 mL/hr over 240 Minutes Intravenous Every 8 hours 06/18/20 1644 06/23/20 1359   06/14/20 0000  ciprofloxacin (CIPRO) 500 MG tablet        500 mg Oral 2 times daily 06/14/20 1100 06/24/20 2359   06/14/20 0000  metroNIDAZOLE (FLAGYL) 500 MG tablet        500 mg Oral 3 times daily 06/14/20 1100 06/24/20 2359   06/11/20 1230  ertapenem (INVANZ) 1,000 mg in sodium chloride 0.9 % 100 mL IVPB  Status:  Discontinued        1 g 200 mL/hr over 30 Minutes Intravenous Every 24 hours 06/11/20 1114 06/18/20 1644   06/06/20 1850  piperacillin-tazobactam (ZOSYN) IVPB 3.375 g  Status:  Discontinued        3.375 g 12.5 mL/hr over 240 Minutes Intravenous Every 8 hours 06/06/20 1850 06/11/20 1114   06/06/20 1830   piperacillin-tazobactam (ZOSYN) IVPB 3.375 g        3.375 g 100 mL/hr over 30 Minutes Intravenous  Once 06/06/20 1818 06/06/20 2039       Assessment/Plan Paroxysmal A. Fib on Eliquis at home  OSA HTN COVID + - off precautions  No prior colonoscopy   Pneumoperitoneum  Acute diverticulitis  S/p Ex-lap, left colectomy, end colostomy, SBR, drain placement - Dr. Donne Hazel, 06/18/2020 - POD #2 - Path pending - D/c Foley. Urine is now straw yellow and transparent. No longer bloody. Good UOP (0.23m/kg/hr) - Appears patient is developing an ileus. Will still allow sips from the floors given her nausea is better this am and she is having flatus from stoma and stool output. Strict NPO if has worsening nausea or develops emesis.  - Cont TPN till tolerating a diet - Cont IV abx through 2/6 - Cont JP drain - Vac to midline. M/W/F - WOCN following for new ostomy - OOB, ambulate/mobilize, PT has seen and recommending  no f/u - Pulm toilet - Okay to restart anticoagulation (IV heparin). hgb uptrending (8.8 > 9.4) - AM labs   FEN - NPO (sips from the floor). If develops n/v - make NPO. IVF per TRH. TPN VTE - SCDs, loveonx. Okay for IV heparin ID - Zosyn through 2/6 Foley - d/c Follow-up - Dr. Donne Hazel   Plan:    LOS: 14 days    Jillyn Ledger , [facility] 06/20/2020, 9:34 AM Please see Amion for pager number during day hours 7:00am-4:30pm

## 2020-06-20 NOTE — Progress Notes (Signed)
Patient having couple of episodes of vomiting (bile). Patient given zofran. MD notified. Given patient's surgery, Requested KUB? Awaiting response.

## 2020-06-21 DIAGNOSIS — I480 Paroxysmal atrial fibrillation: Secondary | ICD-10-CM | POA: Diagnosis not present

## 2020-06-21 DIAGNOSIS — Z8616 Personal history of COVID-19: Secondary | ICD-10-CM | POA: Diagnosis not present

## 2020-06-21 DIAGNOSIS — K5720 Diverticulitis of large intestine with perforation and abscess without bleeding: Secondary | ICD-10-CM | POA: Diagnosis not present

## 2020-06-21 LAB — BASIC METABOLIC PANEL
Anion gap: 8 (ref 5–15)
BUN: 11 mg/dL (ref 6–20)
CO2: 23 mmol/L (ref 22–32)
Calcium: 8.6 mg/dL — ABNORMAL LOW (ref 8.9–10.3)
Chloride: 102 mmol/L (ref 98–111)
Creatinine, Ser: 0.7 mg/dL (ref 0.44–1.00)
GFR, Estimated: >60 mL/min (ref >60)
Glucose, Bld: 140 mg/dL — ABNORMAL HIGH (ref 70–99)
Potassium: 4.1 mmol/L (ref 3.5–5.1)
Sodium: 133 mmol/L — ABNORMAL LOW (ref 135–145)

## 2020-06-21 LAB — CBC
HCT: 31.7 % — ABNORMAL LOW (ref 36.0–46.0)
Hemoglobin: 9.3 g/dL — ABNORMAL LOW (ref 12.0–15.0)
MCH: 25.9 pg — ABNORMAL LOW (ref 26.0–34.0)
MCHC: 29.3 g/dL — ABNORMAL LOW (ref 30.0–36.0)
MCV: 88.3 fL (ref 80.0–100.0)
Platelets: 97 10*3/uL — ABNORMAL LOW (ref 150–400)
RBC: 3.59 MIL/uL — ABNORMAL LOW (ref 3.87–5.11)
RDW: 16.7 % — ABNORMAL HIGH (ref 11.5–15.5)
WBC: 9.1 10*3/uL (ref 4.0–10.5)
nRBC: 0 % (ref 0.0–0.2)

## 2020-06-21 LAB — PREALBUMIN: Prealbumin: 13.4 mg/dL — ABNORMAL LOW (ref 18–38)

## 2020-06-21 LAB — GLUCOSE, CAPILLARY
Glucose-Capillary: 111 mg/dL — ABNORMAL HIGH (ref 70–99)
Glucose-Capillary: 115 mg/dL — ABNORMAL HIGH (ref 70–99)
Glucose-Capillary: 118 mg/dL — ABNORMAL HIGH (ref 70–99)
Glucose-Capillary: 120 mg/dL — ABNORMAL HIGH (ref 70–99)
Glucose-Capillary: 126 mg/dL — ABNORMAL HIGH (ref 70–99)
Glucose-Capillary: 132 mg/dL — ABNORMAL HIGH (ref 70–99)

## 2020-06-21 LAB — HEPARIN LEVEL (UNFRACTIONATED): Heparin Unfractionated: 0.7 IU/mL (ref 0.30–0.70)

## 2020-06-21 MED ORDER — ACETAMINOPHEN 500 MG PO TABS
1000.0000 mg | ORAL_TABLET | Freq: Four times a day (QID) | ORAL | Status: DC
  Administered 2020-06-21 (×2): 1000 mg via ORAL
  Filled 2020-06-21 (×3): qty 2

## 2020-06-21 MED ORDER — TRAVASOL 10 % IV SOLN
INTRAVENOUS | Status: AC
  Filled 2020-06-21: qty 1080

## 2020-06-21 MED ORDER — DOCUSATE SODIUM 100 MG PO CAPS
100.0000 mg | ORAL_CAPSULE | Freq: Two times a day (BID) | ORAL | Status: DC
  Administered 2020-06-22 – 2020-06-27 (×9): 100 mg via ORAL
  Filled 2020-06-21 (×12): qty 1

## 2020-06-21 MED ORDER — ACETAMINOPHEN 500 MG PO TABS
1000.0000 mg | ORAL_TABLET | Freq: Four times a day (QID) | ORAL | Status: DC
  Administered 2020-06-21 – 2020-06-28 (×23): 1000 mg via ORAL
  Filled 2020-06-21 (×22): qty 2

## 2020-06-21 NOTE — Progress Notes (Signed)
PHARMACY - TOTAL PARENTERAL NUTRITION CONSULT NOTE   Indication: Prolonged ileus  Patient Measurements: Height: 5' 8"  (172.7 cm) Weight: 108 kg (238 lb 1.6 oz) IBW/kg (Calculated) : 63.9 TPN AdjBW (KG): 74.9 Body mass index is 36.2 kg/m.  Assessment: 60 years of age female with past medical history of paroxysmal atrial fibrillation and tobacco use admitted on 06/05/20 with 1 month history of abdominal pain treated outpatient as constipation and uncomplicated diverticulitis. Pain progressively worsening despite antibiotic treatment outpatient. 06/05/20 CT showed sigmoid diverticulitis. On 06/15/20, patient symptoms changed to epigastric pain and worsening vomiting. Patient was made NPO. Patient was eating 50-100% of meals up until 06/13/20. CT 1/29 with worsening sigmoid diverticulitis and some degree of obstruction. Plan is to proceed with surgery 2/1 for bowel resection with colostomy.  Pharmacy consulted for TPN given prolonged ileus.  Glucose / Insulin: No hx DM (A1c 5.6). CBGs 121-140. Utilized 0 units SSI.   Electrolytes: Na 133. Corrected Ca wnl. Other electrolytes wnl.  Renal: SCr 0.7 - stable. BUN wnl.  LFTs / TGs: LFTs  and Tbili wnl. TG 77.  Prealbumin / albumin: Albumin 2.7. Prealbumin 13.4 Intake / Output; MIVF:  UOP 1.2 ml/kg/hr. Colostomy 250 mL. R JP drain 183m. Vomited "all night" 2/2 per RD, documented x1 2/3 PM  GI Imaging: 1/20 CT Abd: persistent sigmoid diverticulitis, no drainable fluid collection or abscess 1/29: CT Abd: worsening sigmoid diverticulitis, some degree of obstruction, no evidence for perforation or abscess, likely colostomy Surgeries / Procedures:  2/1: ex lap with left colectomy, end colostomy and small bowel resection Central access: PICC 06/16/20 TPN start date: 06/16/20  Nutritional Goals (per RD recommendations on 1/31): 2000-2200 kcal, 100-110 g protein, fluid > 2L/day Goal TPN rate 85 ml/hr providing 110g protein daily and with 24 g/L lipids MWF  providing weekly average of 2038 kcals/day - goal rate without lipids will meet ~91% of total kcal needs  Current Nutrition:  TPN Clear liquid diet - started 2/1 PM, 0 intake documented, emesis 2/2- 2/3    Plan:  Start 50g lipids MWF Adjust TPN to accommodate lipids: goal rate of 90 ml/hr - this will provide 108g AA and avg of 2040 Kcal/d with lipids MWF (100% total kcal needs) Electrolytes in TPN: Increase Na to 150 mEq/L, Continue Ca to 7 mEq/L, K 33 mEq/L, 8 mEq/L of Mg, 8 mmol/L of Phos. Cl:Ac 2:1. Add standard MVI and trace elements to TPN No insulin needed  Monitor TPN labs on Mon/Thurs Follow up tolerance of diet and advancement  LBenetta Spar PharmD, BCPS, BHill Country Memorial Surgery CenterClinical Pharmacist  Please check AMION for all [place] numbers After 10:00 PM, call MVan Buren

## 2020-06-21 NOTE — Progress Notes (Signed)
Physical Therapy Treatment Patient Details Name: Tammy Davies MRN: [id number] DOB: Dec 30, 1960 Today's Date: 06/21/2020    History of Present Illness Pt is a 60 y.o. F with significant PMH of paroxysmal A. fib on anticoaguation, tobacco use, HTN, admitted for abdominal pain. CT abdomen pelvis showed sigmoid diverticulitis with small abscess. Patient went for exploratory laparotomy with left colectomy, end colostomy and small bowel resection by Dr. Donne Hazel 06/18/2020.    PT Comments    Pt received in supine, agreeable to therapy session and with good participation and tolerance for mobility. Pt able to perform stair trial x10 steps with min guard for safety/balance and unilateral rail, vitals signs stable. Pt performed gait trial ~219f using RW and mostly Supervision (at times min guard for multiple line/RW mgmt) and bed mobility with Supervision/cues. Pt continues to benefit from PT services to progress toward functional mobility goals. Anticipate pt safe to discharge home once medically cleared, will continue to follow acutely to progress safe functional mobility.   Follow Up Recommendations  No PT follow up;Supervision for mobility/OOB     Equipment Recommendations  Rolling walker with 5" wheels    Recommendations for Other Services       Precautions / Restrictions Precautions Precautions: Fall;Other (comment) Precaution Comments: colostomy, wound vac, JP drain Restrictions Weight Bearing Restrictions: No    Mobility  Bed Mobility Overal bed mobility: Needs Assistance Bed Mobility: Rolling;Sidelying to Sit Rolling: Supervision Sidelying to sit: Supervision       General bed mobility comments: Cues for log roll technique, use of bed rail; to R EOB  Transfers Overall transfer level: Needs assistance Equipment used: Rolling walker (2 wheeled) Transfers: Sit to/from Stand Sit to Stand: Min guard         General transfer comment: from EOB to RW and RW to chair; min  guard for stand>sit to chair, cues for safety/hand placement needed  Ambulation/Gait Ambulation/Gait assistance: Supervision Gait Distance (Feet): 200 Feet Assistive device: Rolling walker (2 wheeled) Gait Pattern/deviations: Step-through pattern;Decreased stride length Gait velocity: decreased; ~0.4 m/s   General Gait Details: Slow and steady pace, cues for walker use, min guard while turning in stairwell area but mostly Supervision for line management   Stairs Stairs: Yes Stairs assistance: Min guard;+2 safety/equipment Stair Management: One rail Left;Step to pattern;Forwards Number of Stairs: 10 (1 step x10 reps (multiple lines)) General stair comments: no LOB, min guard for safety as pt initally mildly unsteady but able to maintain balance with L rail and increased time to rest; mild dizziness reported however VSS   Wheelchair Mobility    Modified Rankin (Stroke Patients Only)       Balance Overall balance assessment: Needs assistance Sitting-balance support: Feet supported Sitting balance-Leahy Scale: Good     Standing balance support: Bilateral upper extremity supported Standing balance-Leahy Scale: Poor Standing balance comment: U to BUE support needed, RW to reduce incisional discomfort                            Cognition Arousal/Alertness: Awake/alert Behavior During Therapy: WFL for tasks assessed/performed Overall Cognitive Status: Within Functional Limits for tasks assessed                                 General Comments: motivated, participatory      Exercises Other Exercises Other Exercises: HEP handout ([email or web address]    Access Code: [email or web address] given to pt  to perform as able, info on abdominal protection included/log rolling    General Comments General comments (skin integrity, edema, etc.): pouch ~25% full, pt reports she will monitor pouch for fullness prior to getting out of chair      Pertinent  Vitals/Pain Pain Assessment: Faces Faces Pain Scale: Hurts little more Pain Location: incisional Pain Descriptors / Indicators: Discomfort;Guarding;Grimacing Pain Intervention(s): Monitored during session;Repositioned;Premedicated before session    Home Living                      Prior Function            PT Goals (current goals can now be found in the care plan section) Acute Rehab PT Goals Patient Stated Goal: return home and to work PT Goal Formulation: With patient Time For Goal Achievement: 07/03/20 Potential to Achieve Goals: Good Progress towards PT goals: Progressing toward goals    Frequency    Min 3X/week      PT Plan Current plan remains appropriate    Co-evaluation              AM-PAC PT "6 Clicks" Mobility   Outcome Measure  Help needed turning from your back to your side while in a flat bed without using bedrails?: None Help needed moving from lying on your back to sitting on the side of a flat bed without using bedrails?: A Little Help needed moving to and from a bed to a chair (including a wheelchair)?: A Little Help needed standing up from a chair using your arms (e.g., wheelchair or bedside chair)?: A Little Help needed to walk in hospital room?: A Little Help needed climbing 3-5 steps with a railing? : A Little 6 Click Score: 19    End of Session   Activity Tolerance: Patient tolerated treatment well Patient left: in chair;with call bell/phone within reach Nurse Communication: Mobility status PT Visit Diagnosis: Pain;Difficulty in walking, not elsewhere classified (R26.2) Pain - part of body:  (abdomen)     Time: 9024-0973 PT Time Calculation (min) (ACUTE ONLY): 21 min  Charges:  $Gait Training: 8-22 mins                     Jonnette Nuon P., PTA Acute Rehabilitation Services Pager: [phone] Office: [place] 06/21/2020, 2:18 PM

## 2020-06-21 NOTE — Progress Notes (Addendum)
3 Days Post-Op  Subjective: CC: Pt seen with WOC RN during VAC change. Ongoing nausea during the day yesterday - states compazine helps, zofran does not. Had an episode of bilious emesis last night. Rates her abd pain as a 2-3 at rest, 6 with movement. States she did get up OOB yesterday.   Objective: Vital signs in last 24 hours: Temp:  [97.7 F (36.5 C)-99.7 F (37.6 C)] 97.7 F (36.5 C) (02/04 0753) Pulse Rate:  [77-86] 77 (02/04 0753) Resp:  [12-20] 17 (02/04 0753) BP: (113-161)/(61-99) 120/79 (02/04 0753) SpO2:  [95 %-100 %] 99 % (02/04 0753) Last BM Date: 06/20/20  Intake/Output from previous day: 02/03 0701 - 02/04 0700 In: 1476.5 [P.O.:200; I.V.:1091.3; IV Piggyback:185.2] Out: 3500 [Urine:3100; Drains:150; Stool:250] Intake/Output this shift: Total I/O In: -  Out: 900 [Urine:750; Stool:150]  PE: Gen:  Alert, NAD, pleasant Card:  Reg rate Pulm:  CTAB, no W/R/R, effort normal Abd: Soft, mild distention, appropriately tender,  midline would pink, fascia in tact, peri-wound in tact without cellulitis, stoma pale pink and viable with small amt liquid stool in ostomy pouch. RLQ JP SS (150 cc/24h)   Ext:  No LE edema  Psych: A&Ox3  Skin: no rashes noted, warm and dry  Lab Results:  Recent Labs    06/19/20 0436 06/20/20 0400  WBC 12.3* 10.9*  HGB 8.8* 9.4*  HCT 28.9* 29.3*  PLT 138* 153   BMET Recent Labs    06/20/20 0400 06/21/20 0320  NA 134* 133*  K 4.3 4.1  CL 96* 102  CO2 29 23  GLUCOSE 137* 140*  BUN 15 11  CREATININE 0.88 0.70  CALCIUM 8.8* 8.6*   PT/INR No results for input(s): LABPROT, INR in the last 72 hours. CMP     Component Value Date/Time   NA 133 (L) 06/21/2020 0320   NA 141 07/19/2019 0840   K 4.1 06/21/2020 0320   CL 102 06/21/2020 0320   CO2 23 06/21/2020 0320   GLUCOSE 140 (H) 06/21/2020 0320   BUN 11 06/21/2020 0320   BUN 13 07/19/2019 0840   CREATININE 0.70 06/21/2020 0320   CREATININE 0.88 09/09/2016 1146    CALCIUM 8.6 (L) 06/21/2020 0320   PROT 5.7 (L) 06/20/2020 0400   ALBUMIN 2.7 (L) 06/20/2020 0400   AST 19 06/20/2020 0400   ALT 20 06/20/2020 0400   ALKPHOS 88 06/20/2020 0400   BILITOT 0.7 06/20/2020 0400   GFRNONAA >60 06/21/2020 0320   GFRNONAA 74 09/09/2016 1146   GFRAA 105 07/19/2019 0840   GFRAA 86 09/09/2016 1146   Lipase     Component Value Date/Time   LIPASE 19 06/16/2020 0406       Studies/Results: DG Abd 1 View  Result Date: 06/20/2020 CLINICAL DATA:  Nausea and vomiting.  Recent abdominal surgery. EXAM: ABDOMEN - 1 VIEW COMPARISON:  CT 06/15/2020.  Abdomen series 06/15/2020. FINDINGS: Surgical clips right upper quadrant. Drainage catheter noted over the pelvis. Catheter noted over the mid abdomen. Stomach is nondistended. Slightly prominent air-filled loops of small bowel noted. Colon is nondistended. Mild adynamic ileus could present in this fashion. Follow-up exam suggested to exclude developing bowel obstruction. Hemidiaphragms incompletely imaged. No free air identified. Degenerative change and scoliosis lumbar spine and both hips. Pelvic calcifications consistent phleboliths. IMPRESSION: Mildly prominent air-filled loops of small bowel noted. Colon is nondistended. Mild adynamic ileus could present in this fashion. Follow-up exam suggested to exclude developing bowel obstruction. Electronically Signed   By: Marcello Moores  Register   On: 06/20/2020 06:32    Anti-infectives: Anti-infectives (From admission, onward)   Start     Dose/Rate Route Frequency Ordered Stop   06/18/20 1730  piperacillin-tazobactam (ZOSYN) IVPB 3.375 g  Status:  Discontinued        3.375 g 12.5 mL/hr over 240 Minutes Intravenous Every 8 hours 06/18/20 1644 06/18/20 1644   06/18/20 1730  piperacillin-tazobactam (ZOSYN) IVPB 3.375 g        3.375 g 12.5 mL/hr over 240 Minutes Intravenous Every 8 hours 06/18/20 1644 06/23/20 1359   06/14/20 0000  ciprofloxacin (CIPRO) 500 MG tablet        500 mg Oral 2  times daily 06/14/20 1100 06/24/20 2359   06/14/20 0000  metroNIDAZOLE (FLAGYL) 500 MG tablet        500 mg Oral 3 times daily 06/14/20 1100 06/24/20 2359   06/11/20 1230  ertapenem (INVANZ) 1,000 mg in sodium chloride 0.9 % 100 mL IVPB  Status:  Discontinued        1 g 200 mL/hr over 30 Minutes Intravenous Every 24 hours 06/11/20 1114 06/18/20 1644   06/06/20 1850  piperacillin-tazobactam (ZOSYN) IVPB 3.375 g  Status:  Discontinued        3.375 g 12.5 mL/hr over 240 Minutes Intravenous Every 8 hours 06/06/20 1850 06/11/20 1114   06/06/20 1830  piperacillin-tazobactam (ZOSYN) IVPB 3.375 g        3.375 g 100 mL/hr over 30 Minutes Intravenous  Once 06/06/20 1818 06/06/20 2039       Assessment/Plan Paroxysmal A. Fib on Eliquis at home  OSA HTN COVID + - off precautions  No prior colonoscopy   Acute sigmoid diverticulitis  S/p Ex-lap, left colectomy, end colostomy, SBR, drain placement - Dr. Donne Hazel, 06/18/2020 - POD #3 - Path benign. -on going signs of ileus - nausea, minimal ostomy output. Will still allow sips from the floor.  - Cont TPN until tolerating a diet  - Cont IV abx through 2/6 - Cont JP drain - Vac to midline. M/W/F, WOCN following for new ostomy - OOB, ambulate/mobilize, PT has seen and recommending no f/u - Pulm toilet - AM labs   FEN - sips/chips. TPN VTE - SCDs, hep gtt ID - Zosyn through 2/6 Foley - d/c-ed 2/3, ,external cath Follow-up - Dr. Donne Hazel   Plan: post-op ileus with nausea, await further ROBF. Continue sips/chips and PRN anti-emetics.    LOS: 15 days    Jill Alexanders , [facility] 06/21/2020, 8:06 AM Please see Amion for pager number during day hours 7:00am-4:30pm

## 2020-06-21 NOTE — Progress Notes (Signed)
PROGRESS NOTE                                                                                                                                                                                                             Patient Demographics:    Tammy Davies, is a 60 y.o. female, DOB - [birth year], GBT:[id number]  Outpatient Primary MD for the patient is Patient, No Pcp Per   Admit date - 06/05/2020   LOS - 15  Chief Complaint  Patient presents with  . Abdominal Pain       Brief Narrative: Patient is a 60 y.o. female with PMHx of PAF on anticoagulation, OSA on CPAP, HTN-with diverticulitis not responsive to conservative treatment-subsequently underwent Hartman's procedure on 2/1.  See below for further details  Significant Events: 1/19>> Admit to [facility] for diverticulitis 2/1>> exploratory laparotomy, left colectomy and colostomy 2/1>> cystoscopy with insertion of bilateral ureteral stents  Antibiotics: Zosyn: 1/20>> 1/24, 2/1>> Ertapenem: 1/25>> 1/31  Microbiology data: None  Procedures: 2/1>> exploratory laparotomy, left colectomy and colostomy 2/1>> cystoscopy with insertion of bilateral ureteral stents  Consults: Surgery, urology  DVT prophylaxis: SCDs Start: 06/06/20 1952    Subjective:   Continues to be nauseous-but no vomiting.   Assessment  & Plan :   Acute complicated diverticulitis with abscess: Failed medical management-underwent laparotomy with left colectomy and end ostomy-along with cystoscopy and insertion of bilateral ureteral stents (phlegmon encroaching left ureter)-General surgery/urology following and directing care.  Remains on empiric Zosyn through 2/6.  PAF: Remains in rate controlled atrial flutter-since continues to have nausea-and diet not being advanced-continue IV Cardizem drip and heparin GTT.  Once diet intake stabilizes for a few days-we can contemplate starting oral  Cardizem/Eliquis.    HTN: Stable with Cardizem.  Normocytic anemia: Due to acute illness-no blood loss.  Transfuse if hemoglobin <7  COVID-19 infection: Incidental finding-does not require isolation any further.   COVID-19 Labs: No results for input(s): DDIMER, FERRITIN, LDH, CRP in the last 72 hours.     Component Value Date/Time   BNP 279.9 (H) 06/11/2020 0956    No results for input(s): PROCALCITON in the last 168 hours.  Lab Results  Component Value Date   SARSCOV2NAA POSITIVE (A) 06/06/2020      Nutrition Problem: Nutrition Problem: Inadequate oral intake Etiology: inability to eat Signs/Symptoms: NPO status  Interventions: Refer to RD note for recommendations  Obesity: Estimated body mass index is 36.2 kg/m as calculated from the following:   Height as of this encounter: 5' 8"  (1.727 m).   Weight as of this encounter: 108 kg.   RN pressure injury documentation:    ABG:    Component Value Date/Time   PHART 7.397 06/18/2020 1319   PCO2ART 42.2 06/18/2020 1319   PO2ART 77.1 (L) 06/18/2020 1319   HCO3 25.6 06/18/2020 1319   TCO2 24 06/18/2020 1102   ACIDBASEDEF 2.0 06/18/2020 1102   O2SAT 94.9 06/18/2020 1319    Vent Settings: N/A   Condition - Stable  Family Communication  :  Son- [phone] left voicemail on 2/3  Code Status :  Full Code  Diet :  Diet Order            Diet NPO time specified Except for: Sips with Meds, Ice Chips, Other (See Comments)  Diet effective now           Diet - low sodium heart healthy                  Disposition Plan  :   Status is: Inpatient  Remains inpatient appropriate because:Inpatient level of care appropriate due to severity of illness   Dispo: The patient is from: Home              Anticipated d/c is to: TBD              Anticipated d/c date is: > 3 days              Patient currently is not medically stable to d/c.   Difficult to place patient No   Barriers to discharge: S/p Hartman's  procedure-awaiting bowel function/advancing diet-not stable for discharge as of yet.  On TNA.  Antimicorbials  :    Anti-infectives (From admission, onward)   Start     Dose/Rate Route Frequency Ordered Stop   06/18/20 1730  piperacillin-tazobactam (ZOSYN) IVPB 3.375 g  Status:  Discontinued        3.375 g 12.5 mL/hr over 240 Minutes Intravenous Every 8 hours 06/18/20 1644 06/18/20 1644   06/18/20 1730  piperacillin-tazobactam (ZOSYN) IVPB 3.375 g        3.375 g 12.5 mL/hr over 240 Minutes Intravenous Every 8 hours 06/18/20 1644 06/23/20 1359   06/14/20 0000  ciprofloxacin (CIPRO) 500 MG tablet        500 mg Oral 2 times daily 06/14/20 1100 06/24/20 2359   06/14/20 0000  metroNIDAZOLE (FLAGYL) 500 MG tablet        500 mg Oral 3 times daily 06/14/20 1100 06/24/20 2359   06/11/20 1230  ertapenem (INVANZ) 1,000 mg in sodium chloride 0.9 % 100 mL IVPB  Status:  Discontinued        1 g 200 mL/hr over 30 Minutes Intravenous Every 24 hours 06/11/20 1114 06/18/20 1644   06/06/20 1850  piperacillin-tazobactam (ZOSYN) IVPB 3.375 g  Status:  Discontinued        3.375 g 12.5 mL/hr over 240 Minutes Intravenous Every 8 hours 06/06/20 1850 06/11/20 1114   06/06/20 1830  piperacillin-tazobactam (ZOSYN) IVPB 3.375 g        3.375 g 100 mL/hr over 30 Minutes Intravenous  Once 06/06/20 1818 06/06/20 2039      Inpatient Medications  Scheduled Meds: . acetaminophen  1,000 mg Oral Q6H  . Chlorhexidine Gluconate Cloth  6 each Topical Daily  .  docusate sodium  100 mg Oral BID  . lip balm  1 application Topical BID  . polycarbophil  625 mg Oral BID   Continuous Infusions: . diltiazem (CARDIZEM) infusion 5 mg/hr (06/20/20 1218)  . heparin 2,200 Units/hr (06/21/20 1123)  . methocarbamol (ROBAXIN) IV 1,000 mg (06/20/20 0347)  . piperacillin-tazobactam (ZOSYN)  IV 3.375 g (06/21/20 0520)  . TPN ADULT (ION) 85 mL/hr at 06/20/20 1840  . TPN ADULT (ION)     PRN Meds:.alum & mag hydroxide-simeth,  diphenhydrAMINE, HYDROmorphone (DILAUDID) injection, Ipratropium-Albuterol, magic mouthwash, methocarbamol (ROBAXIN) IV, metoprolol tartrate, ondansetron (ZOFRAN) IV, prochlorperazine, sodium chloride flush   Time Spent in minutes  25  See all Orders from today for further details   Oren Binet M.D on 06/21/2020 at 12:11 PM  To page go to [email or web address] - use universal password  [facility] -  Office  [phone]    Objective:   Vitals:   06/21/20 0024 06/21/20 0509 06/21/20 0753 06/21/20 1204  BP:  119/70 120/79 110/65  Pulse: 81 77 77 77  Resp: 15 12 17 16   Temp: 98.9 F (37.2 C) 98.9 F (37.2 C) 97.7 F (36.5 C) 98.9 F (37.2 C)  TempSrc: Oral Oral Oral Oral  SpO2: 97% 98% 99% 97%  Weight:      Height:        Wt Readings from Last 3 Encounters:  06/18/20 108 kg  05/09/20 111.1 kg  03/27/19 111.9 kg     Intake/Output Summary (Last 24 hours) at 06/21/2020 1211 Last data filed at 06/21/2020 0758 Gross per 24 hour  Intake 1276.5 ml  Output 2125 ml  Net -848.5 ml     Physical Exam Gen Exam:Alert awake-not in any distress HEENT:atraumatic, normocephalic Chest: B/L clear to auscultation anteriorly CVS:S1S2 regular Abdomen:soft non tender, non distended Extremities:no edema Neurology: Non focal Skin: no rash   Data Review:    CBC Recent Labs  Lab 06/15/20 0515 06/16/20 0406 06/17/20 0400 06/18/20 0332 06/18/20 1102 06/18/20 1319 06/19/20 0436 06/20/20 0400 06/21/20 0849  WBC 6.5   < > 5.6 4.5  --  13.0* 12.3* 10.9* 9.1  HGB 11.0*   < > 10.2* 9.9* 9.5* 10.0* 8.8* 9.4* 9.3*  HCT 36.2   < > 33.9* 31.7* 28.0* 32.6* 28.9* 29.3* 31.7*  PLT 285   < > 170 120*  --  141* 138* 153 97*  MCV 86.4   < > 87.4 86.6  --  86.9 86.3 85.4 88.3  MCH 26.3   < > 26.3 27.0  --  26.7 26.3 27.4 25.9*  MCHC 30.4   < > 30.1 31.2  --  30.7 30.4 32.1 29.3*  RDW 16.3*   < > 16.1* 16.1*  --  16.0* 15.9* 16.6* 16.7*  LYMPHSABS 0.9  --  1.1  --   --   --   --   --   --    MONOABS 0.4  --  0.5  --   --   --   --   --   --   EOSABS 0.0  --  0.1  --   --   --   --   --   --   BASOSABS 0.0  --  0.0  --   --   --   --   --   --    < > = values in this interval not displayed.    Chemistries  Recent Labs  Lab 06/15/20 0515 06/16/20 0406 06/17/20 0400 06/18/20  1027 06/18/20 1102 06/18/20 1319 06/19/20 0436 06/20/20 0400 06/21/20 0320  NA 136 136 135 135 138 133* 132* 134* 133*  K 4.0 4.0 3.5 4.1 4.0 4.1 4.8 4.3 4.1  CL 100 101 99 100  --  101 99 96* 102  CO2 27 24 29 28   --  23 26 29 23   GLUCOSE 114* 107* 114* 101*  --  217* 136* 137* 140*  BUN 5* 6 5* 7  --  9 14 15 11   CREATININE 0.90 0.85 0.78 0.76  --  0.82 0.74 0.88 0.70  CALCIUM 9.4 8.7* 8.8* 8.6*  --  8.3* 8.3* 8.8* 8.6*  MG  --   --  1.8 2.0  --   --  2.0 1.9  --   AST 17 20 15   --   --   --   --  19  --   ALT 24 22 19   --   --   --   --  20  --   ALKPHOS 110 95 87  --   --   --   --  88  --   BILITOT 0.6 0.6 0.7  --   --   --   --  0.7  --    ------------------------------------------------------------------------------------------------------------------ Recent Labs    06/19/20 0436  TRIG 77    Lab Results  Component Value Date   HGBA1C 5.6 06/07/2020   ------------------------------------------------------------------------------------------------------------------ No results for input(s): TSH, T4TOTAL, T3FREE, THYROIDAB in the last 72 hours.  Invalid input(s): FREET3 ------------------------------------------------------------------------------------------------------------------ No results for input(s): VITAMINB12, FOLATE, FERRITIN, TIBC, IRON, RETICCTPCT in the last 72 hours.  Coagulation profile No results for input(s): INR, PROTIME in the last 168 hours.  No results for input(s): DDIMER in the last 72 hours.  Cardiac Enzymes No results for input(s): CKMB, TROPONINI, MYOGLOBIN in the last 168 hours.  Invalid input(s):  CK ------------------------------------------------------------------------------------------------------------------    Component Value Date/Time   BNP 279.9 (H) 06/11/2020 2536    Micro Results No results found for this or any previous visit (from the past 240 hour(s)).  Radiology Reports DG Abd 1 View  Result Date: 06/20/2020 CLINICAL DATA:  Nausea and vomiting.  Recent abdominal surgery. EXAM: ABDOMEN - 1 VIEW COMPARISON:  CT 06/15/2020.  Abdomen series 06/15/2020. FINDINGS: Surgical clips right upper quadrant. Drainage catheter noted over the pelvis. Catheter noted over the mid abdomen. Stomach is nondistended. Slightly prominent air-filled loops of small bowel noted. Colon is nondistended. Mild adynamic ileus could present in this fashion. Follow-up exam suggested to exclude developing bowel obstruction. Hemidiaphragms incompletely imaged. No free air identified. Degenerative change and scoliosis lumbar spine and both hips. Pelvic calcifications consistent phleboliths. IMPRESSION: Mildly prominent air-filled loops of small bowel noted. Colon is nondistended. Mild adynamic ileus could present in this fashion. Follow-up exam suggested to exclude developing bowel obstruction. Electronically Signed   By: Marcello Moores  Register   On: 06/20/2020 06:32   CT ABDOMEN PELVIS W CONTRAST  Result Date: 06/15/2020 CLINICAL DATA:  Abdominal distension.  COVID positive. EXAM: CT ABDOMEN AND PELVIS WITH CONTRAST TECHNIQUE: Multidetector CT imaging of the abdomen and pelvis was performed using the standard protocol following bolus administration of intravenous contrast. CONTRAST:  150m OMNIPAQUE IOHEXOL 300 MG/ML  SOLN COMPARISON:  June 10, 2020 FINDINGS: Lower chest: There are small bilateral pleural effusions with adjacent atelectasis.The heart size is normal. 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 There is a similar hypoattenuating area adjacent to the gallbladder fossa.  The  patient is status post prior cholecystectomy.There is no biliary ductal dilation. Pancreas: Normal contours without ductal dilatation. No peripancreatic fluid collection. Spleen: Unremarkable. Adrenals/Urinary Tract: --Adrenal glands: Unremarkable. --Right kidney/ureter: No hydronephrosis or radiopaque kidney stones. --Left kidney/ureter: No hydronephrosis or radiopaque kidney stones. --Urinary bladder: Unremarkable. Stomach/Bowel: --Stomach/Duodenum: No hiatal hernia or other gastric abnormality. Normal duodenal course and caliber. --Small bowel: Unremarkable. --Colon: Again noted are extensive inflammatory changes of the sigmoid colon consistent with sigmoid diverticulitis. These changes have substantially worsened since the prior study. There are now appears to be some degree of obstruction at this level as evidence by mildly dilated upstream colon with air-fluid levels. There are scattered colonic diverticula throughout the remaining portions of the colon. There is no abscess. There is no free air. --Appendix: Normal. Vascular/Lymphatic: Atherosclerotic calcification is present within the non-aneurysmal abdominal aorta, without hemodynamically significant stenosis. --No retroperitoneal lymphadenopathy. --No mesenteric lymphadenopathy. --No pelvic or inguinal lymphadenopathy. Reproductive: Unremarkable Other: No ascites or free air. The abdominal wall is normal. Musculoskeletal. No acute displaced fractures. IMPRESSION: 1. Worsening sigmoid diverticulitis, still without evidence for perforation or abscess formation. There now appears to be some degree of obstruction as evidence by dilatation of the upstream colon now with associated air-fluid levels. 2. Growing small bilateral pleural effusions. 3. Additional chronic findings as detailed above. Aortic Atherosclerosis (ICD10-I70.0). Electronically Signed   By: Constance Holster M.D.   On: 06/15/2020 17:09   CT ABDOMEN PELVIS W CONTRAST  Result Date:  06/10/2020 CLINICAL DATA:  60 year old female with abdominal pain. Concern for acute diverticulitis. EXAM: CT ABDOMEN AND PELVIS WITH CONTRAST TECHNIQUE: Multidetector CT imaging of the abdomen and pelvis was performed using the standard protocol following bolus administration of intravenous contrast. CONTRAST:  132m OMNIPAQUE IOHEXOL 300 MG/ML  SOLN COMPARISON:  CT abdomen pelvis dated 06/06/2020. FINDINGS: Lower chest: Partially visualized small bilateral pleural effusions, new since the prior CT. There is associated partial compressive atelectasis of the lower lobes. Pneumonia is less likely but not excluded clinical correlation is recommended. No intra-abdominal free air. Small free fluid in the pelvis. Hepatobiliary: Fatty liver. Large right hepatic hemangioma better characterized on the prior MRI. No intrahepatic biliary ductal dilatation. Cholecystectomy. Stable appearance of hypoenhancing area in the left lobe of the liver superior to the cholecystectomy. Pancreas: Unremarkable. No pancreatic ductal dilatation or surrounding inflammatory changes. Spleen: Normal in size without focal abnormality. Adrenals/Urinary Tract: The adrenal glands unremarkable. The kidneys, visualized ureters, and urinary bladder appear unremarkable. Stomach/Bowel: There is sigmoid diverticulosis with diffusely thickened and inflamed sigmoid colon most likely acute diverticulitis, and less likely colitis. Clinical correlation is recommended. Overall no significant improvement in the inflammatory changes of the sigmoid colon compared to prior CT. Small amount of partially loculated fluid or phlegmon to the left of the sigmoid colon as seen on the prior CT. No discrete drainable fluid collection or abscess identified. There is a 2.2 x 1.8 cm ill-defined inflammatory area inferior to the sigmoid colon (76/3). No extraluminal air. There is no bowel obstruction. The appendix is normal. Vascular/Lymphatic: Mild aortoiliac atherosclerotic  disease. The IVC is unremarkable. No portal venous gas. There is no adenopathy. Reproductive: The uterus is anteverted and grossly unremarkable. No adnexal masses. Other: Midline vertical anterior pelvic wall incisional scar. Musculoskeletal: Osteopenia with degenerative changes of the spine. No acute osseous pathology. IMPRESSION: 1. Persistent sigmoid diverticulitis. No drainable fluid collection or abscess. 2. Partially visualized small bilateral pleural effusions, new since the prior CT. 3. Fatty liver. Large right hepatic hemangioma  better characterized on the prior MRI. 4. Aortic Atherosclerosis (ICD10-I70.0). Electronically Signed   By: Anner Crete M.D.   On: 06/10/2020 16:22   CT ABDOMEN PELVIS W CONTRAST  Result Date: 06/06/2020 CLINICAL DATA:  Diverticulitis, constipation, worsening pain EXAM: CT ABDOMEN AND PELVIS WITH CONTRAST TECHNIQUE: Multidetector CT imaging of the abdomen and pelvis was performed using the standard protocol following bolus administration of intravenous contrast. CONTRAST:  19m OMNIPAQUE IOHEXOL 300 MG/ML  SOLN COMPARISON:  CT abdomen pelvis, 05/29/2020, abdominal MR, 08/15/2016 FINDINGS: Lower chest: No acute abnormality. 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 Status post cholecystectomy. No biliary ductal dilatation. Pancreas: Unremarkable. No pancreatic ductal dilatation or surrounding inflammatory changes. Spleen: Normal in size without significant abnormality. Adrenals/Urinary Tract: Adrenal glands are unremarkable. Kidneys are normal, without renal calculi, solid lesion, or hydronephrosis. Bladder is unremarkable. Stomach/Bowel: Stomach is within normal limits. Incidental diverticula of the descending portion of the duodenum. Appendix appears normal. Sigmoid diverticulosis with  redemonstrated long segment wall thickening and adjacent fat stranding about the mid to distal sigmoid colon. Suspect a small phlegmon or abscess just inferior to the distal sigmoid colon, closely abutting the posterior aspect of the lower uterine segment, measuring approximately 3.4 x 3.3 x 2.7 cm (series 3, image 74, series 7, image 76). Unchanged small volume of fluid posterior to the midportion of the sigmoid measuring approximately 3.4 x 1.7 cm (series 3, image 70). Vascular/Lymphatic: No significant vascular findings are present. No enlarged abdominal or pelvic lymph nodes. Reproductive: No mass or other significant abnormality. Other: No abdominal wall hernia or abnormality. No abdominopelvic ascites. Musculoskeletal: No acute or significant osseous findings. IMPRESSION: 1. Sigmoid diverticulosis with redemonstrated long segment wall thickening and adjacent fat stranding about the mid to distal sigmoid colon, consistent with acute diverticulitis. 2. Suspect a small phlegmon or abscess just inferior to the distal sigmoid colon, closely abutting the posterior aspect of the lower uterine segment, measuring approximately 3.4 x 3.3 x 2.7 cm, which is more defined appearing than on prior examination. 3. Unchanged small volume of fluid posterior to the midportion of the sigmoid, which is not clearly loculated. 4. 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 Electronically Signed   By: AEddie CandleM.D.   On: 06/06/2020 15:01   CT ABDOMEN PELVIS W CONTRAST  Addendum Date: 05/29/2020   ADDENDUM REPORT: 05/29/2020 12:53 ADDENDUM: This case was discussed with Dr. DRoslynn Amblefollowing its interpretation. He reports that there was small volume contrast extravasation at the IV site during the injection for the CT which had not been reported in the technologist notes. Technologist indicates this was  approximately 10 cc of contrast. Small extravasation volumes of contrast are typically without consequence. Recommend elevation of the extremity with intermittent cold and hot compresses. Electronically Signed   By: WRichardean SaleM.D.   On: 05/29/2020 12:53   Result Date: 05/29/2020 CLINICAL DATA:  Constipation for 1 month with left lower quadrant abdominal pain. Diverticulitis suspected. EXAM: CT ABDOMEN AND PELVIS WITH CONTRAST TECHNIQUE: Multidetector CT imaging of the abdomen and pelvis was performed using the standard protocol following bolus administration of intravenous contrast. CONTRAST:  81mOMNIPAQUE IOHEXOL 300 MG/ML  SOLN COMPARISON:  Abdominal MRI 08/15/2016. FINDINGS: Lower chest: Clear lung bases. No significant pleural or  pericardial effusion. Hepatobiliary: Mass involving segments 6 and 7 previously characterized as a hemangioma on MRI is unchanged in size, measuring 8.3 x 6.6 cm on image 28/3. There is focal fat superior to the cholecystectomy bed. No new or enlarging hepatic lesions. No significant biliary dilatation post cholecystectomy. Pancreas: Unremarkable. No pancreatic ductal dilatation or surrounding inflammatory changes. Spleen: Normal in size without focal abnormality. Adrenals/Urinary Tract: Both adrenal glands appear normal. The kidneys appear normal without evidence of urinary tract calculus, suspicious lesion or hydronephrosis. No bladder abnormalities are seen. Stomach/Bowel: No enteric contrast was administered. The stomach and small bowel appear normal aside from a small proximal duodenal diverticulum. The appendix and proximal colon appear normal. There is long segment wall thickening of the sigmoid colon associated with diverticulosis and surrounding inflammatory changes, consistent with acute diverticulitis. There is a small amount of pericolonic fluid on the left measuring 3.5 x 1.5 cm on image 74/3, but no well-defined extraluminal fluid collection to suggest an  abscess. No evidence of bowel obstruction or free air. Vascular/Lymphatic: There are no enlarged abdominal or pelvic lymph nodes. Minimal aortoiliac atherosclerosis without acute vascular findings. The portal, superior mesenteric and splenic veins are patent. Reproductive: The uterus and ovaries appear normal. No adnexal mass. Other: As above, inflammatory changes in the pelvis with a small extraluminal fluid collection lateral to the sigmoid colon. No ascites or free air. Musculoskeletal: No acute or significant osseous findings. Lumbar spondylosis noted. IMPRESSION: 1. Findings are consistent with acute sigmoid diverticulitis. There is a small amount of pericolonic fluid on the left, but no well-defined extraluminal fluid collection to suggest an abscess. No evidence of bowel obstruction or free air. 2. Stable hepatic hemangioma. 3. Aortic Atherosclerosis (ICD10-I70.0). Electronically Signed: By: Richardean Sale M.D. On: 05/29/2020 12:06   DG Retrograde Pyelogram  Result Date: 06/18/2020 CLINICAL DATA:  Bilateral stent placement with retrograde pyelogram. EXAM: INTRAOPERATIVE BILATERAL RETROGRADE UROGRAPHY TECHNIQUE: Images were obtained with the C-arm fluoroscopic device intraoperatively and submitted for interpretation post-operatively. Please see the procedural report for the amount of contrast and the fluoroscopy time utilized. COMPARISON:  CT abdomen pelvis 06/15/2020 FINDINGS: Five intraoperative fluoroscopic images are submitted for interpretation. There is mild irregular narrowing of the distal right ureter. Rounded filling defect in the distal left ureter most likely air bubble or debris. Bilateral ureteral stents are noted. IMPRESSION: Intraoperative fluoroscopic images of retrograde urography as above. Electronically Signed   By: Miachel Roux M.D.   On: 06/18/2020 11:10   DG CHEST PORT 1 VIEW  Result Date: 06/16/2020 CLINICAL DATA:  PICC line placement EXAM: PORTABLE CHEST 1 VIEW COMPARISON:   06/11/2020 FINDINGS: Normal cardiac silhouette. PICC line pace with tip in distal SVC. Patchy airspace markings. No pneumothorax. No focal consolidation. IMPRESSION: RIGHT PICC line placed without complication. Patchy airspace opacities in lower lobes. Electronically Signed   By: Suzy Bouchard M.D.   On: 06/16/2020 14:16   DG Chest Port 1 View  Result Date: 06/11/2020 CLINICAL DATA:  Dyspnea.  COVID positive EXAM: PORTABLE CHEST 1 VIEW COMPARISON:  10/11/2017 FINDINGS: Cardiac enlargement without heart failure. Lungs are clear without infiltrate or effusion. IMPRESSION: No active disease. Electronically Signed   By: Franchot Gallo M.D.   On: 06/11/2020 10:22   DG ABD ACUTE 2+V W 1V CHEST  Result Date: 06/15/2020 CLINICAL DATA:  Perforated abdominal viscus EXAM: DG ABDOMEN ACUTE WITH 1 VIEW CHEST COMPARISON:  Chest x-ray 06/11/2020, CT abdomen pelvis 06/10/2020 FINDINGS: There is no evidence of dilated bowel loops or  free intraperitoneal air. No radiopaque calculi or other significant radiographic abnormality is seen. Surgical clips overlie the right upper quadrant. The heart size and mediastinal contours are unchanged. Bibasilar compressive changes. No definite focal consolidation. No pulmonary edema. No pleural effusion. No pneumothorax. No acute osseous abnormality. IMPRESSION: Negative abdominal radiographs.  No acute cardiopulmonary disease. Electronically Signed   By: Iven Finn M.D.   On: 06/15/2020 04:06   DG ABD ACUTE 2+V W 1V CHEST  Result Date: 06/14/2020 CLINICAL DATA:  Abdominal pain and emesis EXAM: DG ABDOMEN ACUTE WITH 1 VIEW CHEST COMPARISON:  Chest radiograph June 11, 2020; CT abdomen and pelvis June 10, 2020 FINDINGS: AP chest: There is no edema or airspace opacity. Heart is borderline enlarged with pulmonary vascularity normal. No adenopathy. Supine and upright abdomen: There is moderate stool in the colon. There is no bowel dilatation or air-fluid level to suggest bowel  obstruction. No free air. There are surgical clips in the right upper abdomen. IMPRESSION: No bowel obstruction or free air evident. Moderate stool in colon. No edema or airspace opacity. Borderline cardiac enlargement. Electronically Signed   By: Lowella Grip III M.D.   On: 06/14/2020 10:48   [place] EKG SITE RITE  Result Date: 06/16/2020 If Site Rite image not attached, placement could not be confirmed due to current cardiac rhythm.  [place] EKG SITE RITE  Result Date: 06/16/2020 If Site Rite image not attached, placement could not be confirmed due to current cardiac rhythm.

## 2020-06-21 NOTE — Consult Note (Signed)
[place] Nurse wound follow up Patient receiving care in [place]. Surgical PA at bedside for wound and ostomy assessment. Wound type: Abdominal surgical Measurement: deferred Wound bed: pink with golden adipose tissue Drainage (amount, consistency, odor) serosanginous in cannister Periwound: intact Dressing procedure/placement/frequency: 3 pieces of cut black foam placed into wound. Drape applied, immediate seal obtained. Patient tolerated well.  Ostomy pouch, LUQ removed and changed.  Stoma remains edematous, pink, moist, sutures intact, budded.  Barrier ring used to prevent undermining of barrier by stool.  Stool was thin and green, and a very small amount in existing pouch.  Patient is familiar with the use of paste.  So today I explained the purpose and how to use the barrier ring.  I was able to off set the ostomy pouch and make the VAC dressing and ostomy pouch change independent of each other.  Supplies were ordered for Monday.  Val Riles, RN, MSN, CWOCN, CNS-BC, pager [phone]

## 2020-06-21 NOTE — Progress Notes (Signed)
ANTICOAGULATION CONSULT NOTE - Follow Up Consult  Pharmacy Consult for IV Heparin Indication: atrial fibrillation  No Known Allergies  Patient Measurements: Height: 5' 8"  (172.7 cm) Weight: 108 kg (238 lb 1.6 oz) IBW/kg (Calculated) : 63.9 Heparin Dosing Weight: 89 kg  Vital Signs: Temp: 97.7 F (36.5 C) (02/04 0753) Temp Source: Oral (02/04 0753) BP: 120/79 (02/04 0753) Pulse Rate: 77 (02/04 0753)  Labs: Recent Labs    06/18/20 1319 06/19/20 0436 06/20/20 0400 06/20/20 2130 06/21/20 0320 06/21/20 0511  HGB 10.0* 8.8* 9.4*  --   --   --   HCT 32.6* 28.9* 29.3*  --   --   --   PLT 141* 138* 153  --   --   --   HEPARINUNFRC  --   --   --  0.52  --  0.70  CREATININE 0.82 0.74 0.88  --  0.70  --     Estimated Creatinine Clearance: 97.4 mL/min (by C-G formula based on SCr of 0.7 mg/dL).   Assessment: Pt was recently discharged from [facility] for diverticulitis with possible abscess. Pt now has epigastric pain with worsening vomiting. She is on chronic apixaban for afib (last dose on 1/19). Plan is to hold apixaban and bridge with IV heparin. Pharmacy was consulted to restart IV heparin on 06/20/20 as cleared from surgical team.   POD #3 S/p Ex-lap, left colectomy, end colostomy, SBR, drain placement.   Heparin level therapeutic (0.7) on gtt at 2300 units/hr. No bleeding noted. Hgb stable 9.4 on 2/3.  Today's CBC in process. MD ordered to aim for low therapeutic index (HL) if possible, thus I will, reduce heparin rate.  Goal of Therapy:  Heparin level 0.3-0.7 units/ml Monitor platelets by anticoagulation protocol: Yes   Plan:  Decrease heparin infusion to 2200 units/hr to aim to keep HL in low therapeutic target. F/u daily heparin level and CBC  Nicole Cella, [place] Clinical Pharmacist [phone] Please see amion for complete clinical pharmacist phone list 06/21/2020 9:37 AM

## 2020-06-21 NOTE — Progress Notes (Signed)
JP drain site unremarkable. No bleeding, sutures present. Split gauze placed and secured with tape. Patient tolerated dressing with no issues.  Garlon Hatchet, RN aware.   Trinna Balloon, RN, BSN

## 2020-06-22 DIAGNOSIS — Z933 Colostomy status: Secondary | ICD-10-CM | POA: Diagnosis not present

## 2020-06-22 DIAGNOSIS — R739 Hyperglycemia, unspecified: Secondary | ICD-10-CM

## 2020-06-22 DIAGNOSIS — E669 Obesity, unspecified: Secondary | ICD-10-CM

## 2020-06-22 DIAGNOSIS — E440 Moderate protein-calorie malnutrition: Secondary | ICD-10-CM

## 2020-06-22 DIAGNOSIS — K5720 Diverticulitis of large intestine with perforation and abscess without bleeding: Secondary | ICD-10-CM | POA: Diagnosis not present

## 2020-06-22 DIAGNOSIS — R0902 Hypoxemia: Secondary | ICD-10-CM

## 2020-06-22 DIAGNOSIS — I10 Essential (primary) hypertension: Secondary | ICD-10-CM

## 2020-06-22 DIAGNOSIS — Z7901 Long term (current) use of anticoagulants: Secondary | ICD-10-CM | POA: Diagnosis not present

## 2020-06-22 DIAGNOSIS — N3091 Cystitis, unspecified with hematuria: Secondary | ICD-10-CM | POA: Diagnosis not present

## 2020-06-22 DIAGNOSIS — Z960 Presence of urogenital implants: Secondary | ICD-10-CM

## 2020-06-22 LAB — GLUCOSE, CAPILLARY
Glucose-Capillary: 105 mg/dL — ABNORMAL HIGH (ref 70–99)
Glucose-Capillary: 111 mg/dL — ABNORMAL HIGH (ref 70–99)
Glucose-Capillary: 111 mg/dL — ABNORMAL HIGH (ref 70–99)
Glucose-Capillary: 115 mg/dL — ABNORMAL HIGH (ref 70–99)
Glucose-Capillary: 96 mg/dL (ref 70–99)
Glucose-Capillary: 99 mg/dL (ref 70–99)

## 2020-06-22 LAB — CBC
HCT: 28.3 % — ABNORMAL LOW (ref 36.0–46.0)
Hemoglobin: 8.5 g/dL — ABNORMAL LOW (ref 12.0–15.0)
MCH: 26.4 pg (ref 26.0–34.0)
MCHC: 30 g/dL (ref 30.0–36.0)
MCV: 87.9 fL (ref 80.0–100.0)
Platelets: 71 10*3/uL — ABNORMAL LOW (ref 150–400)
RBC: 3.22 MIL/uL — ABNORMAL LOW (ref 3.87–5.11)
RDW: 16.6 % — ABNORMAL HIGH (ref 11.5–15.5)
WBC: 7.2 10*3/uL (ref 4.0–10.5)
nRBC: 0 % (ref 0.0–0.2)

## 2020-06-22 LAB — HEPARIN LEVEL (UNFRACTIONATED)
Heparin Unfractionated: 0.92 IU/mL — ABNORMAL HIGH (ref 0.30–0.70)
Heparin Unfractionated: 0.69 IU/mL (ref 0.30–0.70)
Heparin Unfractionated: 0.6 IU/mL (ref 0.30–0.70)

## 2020-06-22 LAB — TRIGLYCERIDES: Triglycerides: 124 mg/dL (ref <150)

## 2020-06-22 MED ORDER — TRAVASOL 10 % IV SOLN
INTRAVENOUS | Status: DC
  Filled 2020-06-22: qty 1080

## 2020-06-22 NOTE — Progress Notes (Signed)
ANTICOAGULATION CONSULT NOTE - Follow Up Consult  Pharmacy Consult for IV Heparin Indication: atrial fibrillation  Labs: Recent Labs    06/20/20 0400 06/20/20 2130 06/21/20 0320 06/21/20 0511 06/21/20 0849 06/22/20 0443 06/22/20 1148  HGB 9.4*  --   --   --  9.3* 8.5*  --   HCT 29.3*  --   --   --  31.7* 28.3*  --   PLT 153  --   --   --  97* 71*  --   HEPARINUNFRC  --    < >  --  0.70  --  0.92* 0.69  CREATININE 0.88  --  0.70  --   --   --   --    < > = values in this interval not displayed.    Assessment: Pt was recently discharged from [facility] for diverticulitis with possible abscess. Pt now has epigastric pain with worsening vomiting. She is on chronic apixaban for afib (last dose on 1/19). Plan is to hold apixaban and bridge with IV heparin. Pharmacy was consulted to restart IV heparin on 06/20/20 as cleared from surgical team.   POD #3 S/p Ex-lap, left colectomy, end colostomy, SBR, drain placement.   Heparin level 0.69 units/ml.  Hg down to 8.5, PLT 71.  No bleeding per RN. Will reduce rate slightly since CBC is low and HL target is low therapeutic range.   Goal of Therapy:  Heparin level 0.3-0.7 units/ml Monitor platelets by anticoagulation protocol: Yes   Plan:  Reduce heparin infusion rate to 1900 units/hr Check heparin level in ~6 hours then monitor HL daily Follow CBC, s/sx bleeding, and plans for oral [facility]  Thanks for allowing pharmacy to be a part of this patient's care.  Mercy Riding, PharmD PGY1 Acute Care Pharmacy Resident Please refer to [facility] for unit-specific pharmacist

## 2020-06-22 NOTE — Progress Notes (Addendum)
ANTICOAGULATION CONSULT NOTE - Follow Up Consult  Pharmacy Consult for IV Heparin Indication: atrial fibrillation  Labs: Recent Labs    06/20/20 0400 06/20/20 2130 06/21/20 0320 06/21/20 0511 06/21/20 0849 06/22/20 0443 06/22/20 1148 06/22/20 2123  HGB 9.4*  --   --   --  9.3* 8.5*  --   --   HCT 29.3*  --   --   --  31.7* 28.3*  --   --   PLT 153  --   --   --  97* 71*  --   --   HEPARINUNFRC  --    < >  --    < >  --  0.92* 0.69 0.60  CREATININE 0.88  --  0.70  --   --   --   --   --    < > = values in this interval not displayed.    Assessment: Pt was recently discharged from [facility] for diverticulitis with possible abscess. Pt now has epigastric pain with worsening vomiting. She is on chronic apixaban for afib (last dose on 1/19). Plan is to hold apixaban and bridge with IV heparin. Pharmacy was consulted to restart IV heparin on 06/20/20 as cleared from surgical team.   POD #3 S/p Ex-lap, left colectomy, end colostomy, SBR, drain placement.   Heparin level 0.6 units/ml, still above desired goal on recheck tonight. Will make slight rate adjustment.   Goal of Therapy:  Heparin level 0.3-0.5 units/ml Monitor platelets by anticoagulation protocol: Yes   Plan:  Reduce heparin infusion rate to 1800 units/hr Follow CBC, s/sx bleeding, and plans for oral [facility]  Thanks for allowing pharmacy to be a part of this patient's care.  Erin Hearing PharmD., BCPS Clinical Pharmacist 06/22/2020 10:37 PM

## 2020-06-22 NOTE — Progress Notes (Signed)
PHARMACY - TOTAL PARENTERAL NUTRITION CONSULT NOTE   Indication: Prolonged ileus  Patient Measurements: Height: 5' 8"  (172.7 cm) Weight: 108 kg (238 lb 1.6 oz) IBW/kg (Calculated) : 63.9 TPN AdjBW (KG): 74.9 Body mass index is 36.2 kg/m.  Assessment: 60 years of age female with past medical history of paroxysmal atrial fibrillation and tobacco use admitted on 06/05/20 with 1 month history of abdominal pain treated outpatient as constipation and uncomplicated diverticulitis. Pain progressively worsening despite antibiotic treatment outpatient. 06/05/20 CT showed sigmoid diverticulitis. On 06/15/20, patient symptoms changed to epigastric pain and worsening vomiting. Patient was made NPO. Patient was eating 50-100% of meals up until 06/13/20. CT 1/29 with worsening sigmoid diverticulitis and some degree of obstruction. Plan is to proceed with surgery 2/1 for bowel resection with colostomy. Pharmacy consulted for TPN given prolonged ileus.  Glucose / Insulin: No hx DM (A1c 5.6). CBGs controlled. SSI d/c'd due to lack of use Electrolytes: last bmet 2/4 - Na 133; others stable WNL Renal: SCr 0.7 - stable. BUN wnl.  LFTs / TGs: LFTs / Tbili / TG wnl Prealbumin / albumin: Albumin 2.7. Prealbumin 13.4 Intake / Output; MIVF:  UOP 0.5 ml/kg/hr per documentation. Colostomy output 234m/24hrs. R JP drain output 1652m24hrs. Less nausea overnight 2/5 GI Imaging: 1/20 CT Abd: persistent sigmoid diverticulitis, no drainable fluid collection or abscess 1/29: CT Abd: worsening sigmoid diverticulitis, some degree of obstruction, no evidence for perforation or abscess, likely colostomy Surgeries / Procedures: 2/1: ex lap with left colectomy, end colostomy and small bowel resection Central access: PICC 06/16/20 TPN start date: 06/16/20  Nutritional Goals (per RD recommendations on 1/31): 2000-2200 kcal, 100-110 g protein, fluid > 2L/day Goal TPN rate 85 ml/hr - providing 110g protein daily and with 24 g/L lipids  MWF, for weekly average of 2038 kcals/day  Current Nutrition:  TPN Clear liquid diet - started 2/1 PM, 0 intake documented, less emesis reported overnight  Plan:  Continue TPN at goal rate 90 ml/hr - will provide 108g AA and avg of 2038 Kcal/d with lipids MWF only Electrolytes in TPN: continue same today - Na 150 mEq/L, Ca 7 mEq/L, K 33 mEq/L, 8 mEq/L of Mg, 8 mmol/L of Phos. Cl:Ac 2:1 Add standard MVI and trace elements to TPN No insulin needed  Monitor TPN labs on Mon/Thurs Follow up tolerance/advancement of diet and ability to wean TPN   HaArturo MortonPharmD, BCPS Please check AMION for all [place] numbers Clinical Pharmacist 06/22/2020 8:58 AM

## 2020-06-22 NOTE — Progress Notes (Signed)
ANTICOAGULATION CONSULT NOTE - Follow Up Consult  Pharmacy Consult for IV Heparin Indication: atrial fibrillation  Labs: Recent Labs    06/20/20 0400 06/20/20 2130 06/21/20 0320 06/21/20 0511 06/21/20 0849 06/22/20 0443  HGB 9.4*  --   --   --  9.3* 8.5*  HCT 29.3*  --   --   --  31.7* 28.3*  PLT 153  --   --   --  97* 71*  HEPARINUNFRC  --  0.52  --  0.70  --  0.92*  CREATININE 0.88  --  0.70  --   --   --     Assessment: Pt was recently discharged from [facility] for diverticulitis with possible abscess. Pt now has epigastric pain with worsening vomiting. She is on chronic apixaban for afib (last dose on 1/19). Plan is to hold apixaban and bridge with IV heparin. Pharmacy was consulted to restart IV heparin on 06/20/20 as cleared from surgical team.   POD #3 S/p Ex-lap, left colectomy, end colostomy, SBR, drain placement.   Heparin level 0.92 units/ml.  Hg down to 8.5, PTLC down to 71.  No bleeding per RN  Goal of Therapy:  Heparin level 0.3-0.7 units/ml Monitor platelets by anticoagulation protocol: Yes   Plan:  Decrease heparin infusion to 2000 units/hr Check heparin level in ~ 6 hours  Thanks for allowing pharmacy to be a part of this patient's care.  Excell Seltzer, PharmD Clinical Pharmacist  06/22/2020 5:29 AM

## 2020-06-22 NOTE — Progress Notes (Signed)
4 Days Post-Op   Subjective/Chief Complaint: Feeling better. Less nausea. Tolerating ice chips   Objective: Vital signs in last 24 hours: Temp:  [97.9 F (36.6 C)-99.4 F (37.4 C)] 97.9 F (36.6 C) (02/05 0757) Pulse Rate:  [75-80] 76 (02/05 0757) Resp:  [14-17] 14 (02/05 0757) BP: (110-125)/(57-86) 113/66 (02/05 0757) SpO2:  [97 %-100 %] 98 % (02/05 0757) Last BM Date: 06/21/20  Intake/Output from previous day: 02/04 0701 - 02/05 0700 In: 2214.2 [P.O.:100; I.V.:1899.4; IV Piggyback:214.8] Out: 1615 [Urine:1250; Drains:165; Stool:200] Intake/Output this shift: No intake/output data recorded.  General appearance: alert and cooperative Resp: clear to auscultation bilaterally Cardio: regular rate and rhythm GI: soft, moderate tenderness near incision. ostomy pink and productive  Lab Results:  Recent Labs    06/21/20 0849 06/22/20 0443  WBC 9.1 7.2  HGB 9.3* 8.5*  HCT 31.7* 28.3*  PLT 97* 71*   BMET Recent Labs    06/20/20 0400 06/21/20 0320  NA 134* 133*  K 4.3 4.1  CL 96* 102  CO2 29 23  GLUCOSE 137* 140*  BUN 15 11  CREATININE 0.88 0.70  CALCIUM 8.8* 8.6*   PT/INR No results for input(s): LABPROT, INR in the last 72 hours. ABG No results for input(s): PHART, HCO3 in the last 72 hours.  Invalid input(s): PCO2, PO2  Studies/Results: No results found.  Anti-infectives: Anti-infectives (From admission, onward)   Start     Dose/Rate Route Frequency Ordered Stop   06/18/20 1730  piperacillin-tazobactam (ZOSYN) IVPB 3.375 g  Status:  Discontinued        3.375 g 12.5 mL/hr over 240 Minutes Intravenous Every 8 hours 06/18/20 1644 06/18/20 1644   06/18/20 1730  piperacillin-tazobactam (ZOSYN) IVPB 3.375 g        3.375 g 12.5 mL/hr over 240 Minutes Intravenous Every 8 hours 06/18/20 1644 06/23/20 1359   06/14/20 0000  ciprofloxacin (CIPRO) 500 MG tablet        500 mg Oral 2 times daily 06/14/20 1100 06/24/20 2359   06/14/20 0000  metroNIDAZOLE (FLAGYL)  500 MG tablet        500 mg Oral 3 times daily 06/14/20 1100 06/24/20 2359   06/11/20 1230  ertapenem (INVANZ) 1,000 mg in sodium chloride 0.9 % 100 mL IVPB  Status:  Discontinued        1 g 200 mL/hr over 30 Minutes Intravenous Every 24 hours 06/11/20 1114 06/18/20 1644   06/06/20 1850  piperacillin-tazobactam (ZOSYN) IVPB 3.375 g  Status:  Discontinued        3.375 g 12.5 mL/hr over 240 Minutes Intravenous Every 8 hours 06/06/20 1850 06/11/20 1114   06/06/20 1830  piperacillin-tazobactam (ZOSYN) IVPB 3.375 g        3.375 g 100 mL/hr over 30 Minutes Intravenous  Once 06/06/20 1818 06/06/20 2039      Assessment/Plan: s/p Procedure(s): SIGMOID COLECTOMY WITH COLOSTOMY CREATION; DRAINAGE OF PELVIC ABSCESS (N/A) EXPLORATORY LAPAROTOMY (N/A) CYSTOSCOPY WITH STENT PLACEMENT (Bilateral) Advance diet. Allow clears today Paroxysmal A. Fib on Eliquis at home  OSA HTN COVID + - off precautions  No prior colonoscopy   Acute sigmoid diverticulitis  S/p Ex-lap, left colectomy, end colostomy, SBR, drain placement - Dr. Donne Hazel, 06/18/2020 - POD #4 - Path benign. -on going signs of ileus - nausea, minimal ostomy output. Will still allow sips from the floor.  - Cont TPN until tolerating a diet  - Cont IV abx through 2/6 - Cont JP drain - Vac to midline. M/W/F, WOCN  following for new ostomy - OOB, ambulate/mobilize, PT has seen and recommending no f/u - Pulm toilet - AM labs   FEN - sips/chips. TPN VTE - SCDs, hep gtt ID - Zosyn through 2/6 Foley - d/c-ed 2/3, ,external cath Follow-up - Dr. Donne Hazel   Plan: post-op ileus with nausea, await further ROBF. Continue sips/chips and PRN anti-emetics.   LOS: 16 days    [name] 06/22/2020

## 2020-06-22 NOTE — Progress Notes (Signed)
PROGRESS NOTE    Tammy Davies  TDH:[id number] DOB: 22-Aug-1960 DOA: 06/05/2020 PCP: Patient, No Pcp Per   Brief Narrative:  HPI on 06/06/2020 by Dr. Laurey Arrow Tammy Davies is a 60 y.o. female with medical history significant for paroxysmal a fib on noac, smoker, osa not on cpap, htn, history pancreatitis x1, who presents with the above.  Symptoms began a month ago. Began as constipation. Then developed right lower quadrant pain. At first came and went but has worsened. With some associated nausea and decreased appetite. Stools now abnormal, are watery, sometimes with small amounts of red blood. Seen in this ED 1 week ago and diagnosed w/ acute uncomplicated diverticulitis, prescribed augmentin. Took that medicine but LLQ abdominal pain has worsened. Denies fever, no vomiting. No chest pain or cough or sob. No dysuria. No history colonoscopy. No hx of diverticulitis  Interim history Patient admitted with diverticulitis which was unresponsive to conservative treatment and subsequent underwent Hartman's procedure on 06/18/2020.  General surgery continues to follow.  Currently on TPN.  Urology was also consulted as patient underwent cystoscopy with insertion of bilateral ureteral stents on 2/1. Assessment & Plan   Acute complicated diverticulitis with abscess -General surgery consulted and appreciated -Patient failed medical management and underwent laparotomy with left colectomy and end ostomy on 2/1. -Urology was also consulted for periop ureteral stenting which occurred on 2/1, stents were removed on 2/2. -Currently has JP drain as well as wound VAC in place to the midline with new ostomy which wound care is following -Currently on TPN -Patient states that she had [place] ice yesterday evening however had nausea following this -Currently on Zosyn-which will continue through 06/23/2020  Paroxysmal atrial fibrillation/a flutter -Patient appears to be rate controlled although in atrial  flutter -Currently on IV Cardizem as well as heparin gtt. -May consider transitioning to oral Cardizem once patient is able to tolerate oral intake  Essential hypertension -Currently stable, on Cardizem  Normocytic anemia -Hemoglobin currently 8.5, appears to be stable -If hemoglobin drops below 7 will transfuse  COVID-19 infection -Incidental finding.  Patient has completed isolation.  DVT Prophylaxis  SCDs  Code Status: Full  Family Communication: None at bedside  Disposition Plan:  Status is: Inpatient  Remains inpatient appropriate because:IV treatments appropriate due to intensity of illness or inability to take PO   Dispo: The patient is from: Home              Anticipated d/c is to: Home              Anticipated d/c date is: > 3 days              Patient currently is not medically stable to d/c.   Difficult to place patient No   Consultants General surgery Urology  Procedures  2/1>> exploratory laparotomy, left colectomy and colostomy 2/1>> cystoscopy with insertion of bilateral ureteral stents  Antibiotics   Anti-infectives (From admission, onward)   Start     Dose/Rate Route Frequency Ordered Stop   06/18/20 1730  piperacillin-tazobactam (ZOSYN) IVPB 3.375 g  Status:  Discontinued        3.375 g 12.5 mL/hr over 240 Minutes Intravenous Every 8 hours 06/18/20 1644 06/18/20 1644   06/18/20 1730  piperacillin-tazobactam (ZOSYN) IVPB 3.375 g        3.375 g 12.5 mL/hr over 240 Minutes Intravenous Every 8 hours 06/18/20 1644 06/23/20 1359   06/14/20 0000  ciprofloxacin (CIPRO) 500 MG tablet  500 mg Oral 2 times daily 06/14/20 1100 06/24/20 2359   06/14/20 0000  metroNIDAZOLE (FLAGYL) 500 MG tablet        500 mg Oral 3 times daily 06/14/20 1100 06/24/20 2359   06/11/20 1230  ertapenem (INVANZ) 1,000 mg in sodium chloride 0.9 % 100 mL IVPB  Status:  Discontinued        1 g 200 mL/hr over 30 Minutes Intravenous Every 24 hours 06/11/20 1114 06/18/20 1644    06/06/20 1850  piperacillin-tazobactam (ZOSYN) IVPB 3.375 g  Status:  Discontinued        3.375 g 12.5 mL/hr over 240 Minutes Intravenous Every 8 hours 06/06/20 1850 06/11/20 1114   06/06/20 1830  piperacillin-tazobactam (ZOSYN) IVPB 3.375 g        3.375 g 100 mL/hr over 30 Minutes Intravenous  Once 06/06/20 1818 06/06/20 2039      Subjective:   Tammy Davies seen and examined today.   Patient states she is feeling overall better however did have some nausea last night after having [place] ice.  Denies any vomiting.  Currently denies chest pain or shortness of breath, dizziness or headache.  Does state that she has some tenderness when her stomach is touched.  Objective:   Vitals:   06/21/20 2009 06/21/20 2315 06/22/20 0403 06/22/20 0757  BP: (!) 111/57 117/86 111/66 113/66  Pulse: 76 77 76 76  Resp: 15 16 14 14   Temp: 98.2 F (36.8 C) 98.6 F (37 C) 98 F (36.7 C) 97.9 F (36.6 C)  TempSrc: Oral Oral Oral Oral  SpO2: 98% 98% 98% 98%  Weight:      Height:        Intake/Output Summary (Last 24 hours) at 06/22/2020 1105 Last data filed at 06/22/2020 0800 Gross per 24 hour  Intake 2214.21 ml  Output 685 ml  Net 1529.21 ml   Filed Weights   06/15/20 0430 06/16/20 0404 06/18/20 0814  Weight: 110.5 kg 108 kg 108 kg    Exam  General: Well developed, chronically ill-appearing, NAD  HEENT: NCAT,  mucous membranes moist.   Cardiovascular: S1 S2 auscultated, irregular  Respiratory: Diminished breath sounds anteriorly however clear  Abdomen: Soft, obese, TTP,  nondistended, + bowel sounds, wound VAC to the midline, ostomy LLQ  Extremities: warm dry without cyanosis clubbing or edema  Neuro: AAOx3, nonfocal  Psych: Normal affect and demeanor with intact judgement and insight   Data Reviewed: I have personally reviewed following labs and imaging studies  CBC: Recent Labs  Lab 06/17/20 0400 06/18/20 0332 06/18/20 1319 06/19/20 0436 06/20/20 0400 06/21/20 0849  06/22/20 0443  WBC 5.6   < > 13.0* 12.3* 10.9* 9.1 7.2  NEUTROABS 3.8  --   --   --   --   --   --   HGB 10.2*   < > 10.0* 8.8* 9.4* 9.3* 8.5*  HCT 33.9*   < > 32.6* 28.9* 29.3* 31.7* 28.3*  MCV 87.4   < > 86.9 86.3 85.4 88.3 87.9  PLT 170   < > 141* 138* 153 97* 71*   < > = values in this interval not displayed.   Basic Metabolic Panel: Recent Labs  Lab 06/17/20 0400 06/18/20 0332 06/18/20 1102 06/18/20 1319 06/19/20 0436 06/20/20 0400 06/21/20 0320  NA 135 135 138 133* 132* 134* 133*  K 3.5 4.1 4.0 4.1 4.8 4.3 4.1  CL 99 100  --  101 99 96* 102  CO2 29 28  --  23  26 29 23   GLUCOSE 114* 101*  --  217* 136* 137* 140*  BUN 5* 7  --  9 14 15 11   CREATININE 0.78 0.76  --  0.82 0.74 0.88 0.70  CALCIUM 8.8* 8.6*  --  8.3* 8.3* 8.8* 8.6*  MG 1.8 2.0  --   --  2.0 1.9  --   PHOS 3.9 4.3  --   --  3.7 3.5  --    GFR: Estimated Creatinine Clearance: 97.4 mL/min (by C-G formula based on SCr of 0.7 mg/dL). Liver Function Tests: Recent Labs  Lab 06/16/20 0406 06/17/20 0400 06/20/20 0400  AST 20 15 19   ALT 22 19 20   ALKPHOS 95 87 88  BILITOT 0.6 0.7 0.7  PROT 5.7* 5.6* 5.7*  ALBUMIN 2.8* 2.8* 2.7*   Recent Labs  Lab 06/16/20 0406  LIPASE 19   No results for input(s): AMMONIA in the last 168 hours. Coagulation Profile: No results for input(s): INR, PROTIME in the last 168 hours. Cardiac Enzymes: No results for input(s): CKTOTAL, CKMB, CKMBINDEX, TROPONINI in the last 168 hours. BNP (last 3 results) No results for input(s): PROBNP in the last 8760 hours. HbA1C: No results for input(s): HGBA1C in the last 72 hours. CBG: Recent Labs  Lab 06/21/20 1608 06/21/20 2007 06/21/20 2334 06/22/20 0405 06/22/20 0824  GLUCAP 118* 132* 115* 111* 99   Lipid Profile: Recent Labs    06/22/20 0935  TRIG 124   Thyroid Function Tests: No results for input(s): TSH, T4TOTAL, FREET4, T3FREE, THYROIDAB in the last 72 hours. Anemia Panel: No results for input(s): VITAMINB12,  FOLATE, FERRITIN, TIBC, IRON, RETICCTPCT in the last 72 hours. Urine analysis:    Component Value Date/Time   COLORURINE AMBER (A) 05/29/2020 0951   APPEARANCEUR HAZY (A) 05/29/2020 0951   LABSPEC 1.028 05/29/2020 0951   PHURINE 5.0 05/29/2020 0951   GLUCOSEU NEGATIVE 05/29/2020 0951   HGBUR NEGATIVE 05/29/2020 0951   BILIRUBINUR NEGATIVE 05/29/2020 0951   KETONESUR 5 (A) 05/29/2020 0951   PROTEINUR 30 (A) 05/29/2020 0951   UROBILINOGEN 0.2 09/09/2016 1206   NITRITE NEGATIVE 05/29/2020 0951   LEUKOCYTESUR NEGATIVE 05/29/2020 0951   Sepsis Labs: @LABRCNTIP (procalcitonin:4,lacticidven:4)  )No results found for this or any previous visit (from the past 240 hour(s)).    Radiology Studies: No results found.   Scheduled Meds: . acetaminophen  1,000 mg Oral Q6H  . Chlorhexidine Gluconate Cloth  6 each Topical Daily  . docusate sodium  100 mg Oral BID  . lip balm  1 application Topical BID  . polycarbophil  625 mg Oral BID   Continuous Infusions: . diltiazem (CARDIZEM) infusion 5 mg/hr (06/22/20 0808)  . heparin 2,000 Units/hr (06/22/20 0531)  . methocarbamol (ROBAXIN) IV 1,000 mg (06/20/20 0347)  . piperacillin-tazobactam (ZOSYN)  IV 3.375 g (06/22/20 0507)  . TPN ADULT (ION) 90 mL/hr at 06/22/20 0403  . TPN ADULT (ION)       LOS: 16 days   Time Spent in minutes   45 minutes  Lena Gores D.O. on 06/22/2020 at 11:05 AM  Between 7am to 7pm - Please see pager noted on [email or web address]  After 7pm go to [email or web address]  And look for the night coverage person covering for me after hours  [facility] Office  [phone]

## 2020-06-23 DIAGNOSIS — N3091 Cystitis, unspecified with hematuria: Secondary | ICD-10-CM | POA: Diagnosis not present

## 2020-06-23 DIAGNOSIS — Z7901 Long term (current) use of anticoagulants: Secondary | ICD-10-CM | POA: Diagnosis not present

## 2020-06-23 DIAGNOSIS — K5720 Diverticulitis of large intestine with perforation and abscess without bleeding: Secondary | ICD-10-CM | POA: Diagnosis not present

## 2020-06-23 DIAGNOSIS — Z933 Colostomy status: Secondary | ICD-10-CM | POA: Diagnosis not present

## 2020-06-23 LAB — CBC
HCT: 28.8 % — ABNORMAL LOW (ref 36.0–46.0)
Hemoglobin: 8.8 g/dL — ABNORMAL LOW (ref 12.0–15.0)
MCH: 26.6 pg (ref 26.0–34.0)
MCHC: 30.6 g/dL (ref 30.0–36.0)
MCV: 87 fL (ref 80.0–100.0)
Platelets: 68 10*3/uL — ABNORMAL LOW (ref 150–400)
RBC: 3.31 MIL/uL — ABNORMAL LOW (ref 3.87–5.11)
RDW: 16.8 % — ABNORMAL HIGH (ref 11.5–15.5)
WBC: 7.5 10*3/uL (ref 4.0–10.5)
nRBC: 0 % (ref 0.0–0.2)

## 2020-06-23 LAB — HEPARIN LEVEL (UNFRACTIONATED)
Heparin Unfractionated: 0.64 IU/mL (ref 0.30–0.70)
Heparin Unfractionated: 0.55 IU/mL (ref 0.30–0.70)

## 2020-06-23 LAB — BASIC METABOLIC PANEL
Anion gap: 8 (ref 5–15)
BUN: 14 mg/dL (ref 6–20)
CO2: 25 mmol/L (ref 22–32)
Calcium: 8.6 mg/dL — ABNORMAL LOW (ref 8.9–10.3)
Chloride: 101 mmol/L (ref 98–111)
Creatinine, Ser: 0.75 mg/dL (ref 0.44–1.00)
GFR, Estimated: >60 mL/min (ref >60)
Glucose, Bld: 119 mg/dL — ABNORMAL HIGH (ref 70–99)
Potassium: 4.2 mmol/L (ref 3.5–5.1)
Sodium: 134 mmol/L — ABNORMAL LOW (ref 135–145)

## 2020-06-23 LAB — GLUCOSE, CAPILLARY
Glucose-Capillary: 108 mg/dL — ABNORMAL HIGH (ref 70–99)
Glucose-Capillary: 84 mg/dL (ref 70–99)
Glucose-Capillary: 95 mg/dL (ref 70–99)
Glucose-Capillary: 94 mg/dL (ref 70–99)
Glucose-Capillary: 88 mg/dL (ref 70–99)
Glucose-Capillary: 90 mg/dL (ref 70–99)

## 2020-06-23 LAB — APTT: aPTT: 51 seconds — ABNORMAL HIGH (ref 24–36)

## 2020-06-23 MED ORDER — TRAVASOL 10 % IV SOLN
INTRAVENOUS | Status: AC
  Filled 2020-06-23: qty 480

## 2020-06-23 MED ORDER — ARGATROBAN 50 MG/50ML IV SOLN
0.8000 ug/kg/min | INTRAVENOUS | Status: DC
  Administered 2020-06-23: 17:00:00 0.5 ug/kg/min via INTRAVENOUS
  Administered 2020-06-24: 14:00:00 0.6 ug/kg/min via INTRAVENOUS
  Filled 2020-06-23 (×4): qty 50

## 2020-06-23 NOTE — Progress Notes (Signed)
PROGRESS NOTE    Tammy Davies  JJO:[id number] DOB: 1960-11-04 DOA: 06/05/2020 PCP: Patient, No Pcp Per   Brief Narrative:  HPI on 06/06/2020 by Dr. Laurey Arrow Tammy Davies is a 60 y.o. female with medical history significant for paroxysmal a fib on noac, smoker, osa not on cpap, htn, history pancreatitis x1, who presents with the above.  Symptoms began a month ago. Began as constipation. Then developed right lower quadrant pain. At first came and went but has worsened. With some associated nausea and decreased appetite. Stools now abnormal, are watery, sometimes with small amounts of red blood. Seen in this ED 1 week ago and diagnosed w/ acute uncomplicated diverticulitis, prescribed augmentin. Took that medicine but LLQ abdominal pain has worsened. Denies fever, no vomiting. No chest pain or cough or sob. No dysuria. No history colonoscopy. No hx of diverticulitis  Interim history Patient admitted with diverticulitis which was unresponsive to conservative treatment and subsequent underwent Hartman's procedure on 06/18/2020.  General surgery continues to follow.  Currently on TPN.  Urology was also consulted as patient underwent cystoscopy with insertion of bilateral ureteral stents on 2/1. Assessment & Plan   Acute complicated diverticulitis with abscess -General surgery consulted and appreciated -Patient failed medical management and underwent laparotomy with left colectomy and end ostomy on 2/1. -Urology was also consulted for periop ureteral stenting which occurred on 2/1, stents were removed on 2/2. -Currently has JP drain as well as wound VAC in place to the midline with new ostomy which wound care is following -Currently on TPN-until able to tolerate diet -Currently on Zosyn-which will continue through today, 2/6 -No further nausea today -Was able to tolerate clear liquid diet and will advance diet today  Paroxysmal atrial fibrillation/a flutter -Patient appears to be rate  controlled although in atrial flutter -Currently on IV Cardizem as well as heparin gtt. -May consider transitioning to oral Cardizem once patient is able to tolerate oral intake  Essential hypertension -Currently stable, on Cardizem  Normocytic anemia -Hemoglobin currently 8.5, appears to be stable -If hemoglobin drops below 7 will transfuse  Thrombocytopenia -patient currently on heparin drip- no signs of bleeding -not sure why CBC was discontinued today, but will reorder  COVID-19 infection -Incidental finding.  Patient has completed isolation.  DVT Prophylaxis  SCDs  Code Status: Full  Family Communication: None at bedside  Disposition Plan:  Status is: Inpatient  Remains inpatient appropriate because:IV treatments appropriate due to intensity of illness or inability to take PO   Dispo: The patient is from: Home              Anticipated d/c is to: Home              Anticipated d/c date is: > 3 days              Patient currently is not medically stable to d/c.   Difficult to place patient No   Consultants General surgery Urology  Procedures  2/1>> exploratory laparotomy, left colectomy and colostomy 2/1>> cystoscopy with insertion of bilateral ureteral stents  Antibiotics   Anti-infectives (From admission, onward)   Start     Dose/Rate Route Frequency Ordered Stop   06/18/20 1730  piperacillin-tazobactam (ZOSYN) IVPB 3.375 g  Status:  Discontinued        3.375 g 12.5 mL/hr over 240 Minutes Intravenous Every 8 hours 06/18/20 1644 06/18/20 1644   06/18/20 1730  piperacillin-tazobactam (ZOSYN) IVPB 3.375 g  3.375 g 12.5 mL/hr over 240 Minutes Intravenous Every 8 hours 06/18/20 1644 06/23/20 0936   06/14/20 0000  ciprofloxacin (CIPRO) 500 MG tablet        500 mg Oral 2 times daily 06/14/20 1100 06/24/20 2359   06/14/20 0000  metroNIDAZOLE (FLAGYL) 500 MG tablet        500 mg Oral 3 times daily 06/14/20 1100 06/24/20 2359   06/11/20 1230  ertapenem  (INVANZ) 1,000 mg in sodium chloride 0.9 % 100 mL IVPB  Status:  Discontinued        1 g 200 mL/hr over 30 Minutes Intravenous Every 24 hours 06/11/20 1114 06/18/20 1644   06/06/20 1850  piperacillin-tazobactam (ZOSYN) IVPB 3.375 g  Status:  Discontinued        3.375 g 12.5 mL/hr over 240 Minutes Intravenous Every 8 hours 06/06/20 1850 06/11/20 1114   06/06/20 1830  piperacillin-tazobactam (ZOSYN) IVPB 3.375 g        3.375 g 100 mL/hr over 30 Minutes Intravenous  Once 06/06/20 1818 06/06/20 2039      Subjective:   Tammy Davies seen and examined today.  Patient denies any nausea overnight and was able to tolerate full liquids yesterday.  Denies any vomiting at this time.  Denies chest pain, shortness of breath, dizziness or headache.  States that she has not noticed much output in her colostomy bag although is passing gas.  Does complain of some abdominal tenderness.    Objective:   Vitals:   06/22/20 2338 06/23/20 0330 06/23/20 0742 06/23/20 1110  BP: 102/70 106/68 113/70 115/61  Pulse: 82 79 76 79  Resp: 20 19 18 17   Temp: 99.1 F (37.3 C) 98.9 F (37.2 C) 97.8 F (36.6 C) 98.1 F (36.7 C)  TempSrc: Oral Oral Oral Oral  SpO2: 96% 98% 100% 100%  Weight:  110.8 kg    Height:        Intake/Output Summary (Last 24 hours) at 06/23/2020 1134 Last data filed at 06/23/2020 1111 Gross per 24 hour  Intake -  Output 730 ml  Net -730 ml   Filed Weights   06/16/20 0404 06/18/20 0814 06/23/20 0330  Weight: 108 kg 108 kg 110.8 kg   Exam  General: Well developed, chronically ill-appearing, NAD  HEENT: NCAT, mucous membranes moist.   Cardiovascular: S1 S2 auscultated, irregular  Respiratory: Clear to auscultation bilaterally with equal chest rise  Abdomen: Soft, obese, TTP, nondistended, + bowel sounds.  Wound VAC to midline, ostomy with liquid stool  Extremities: warm dry without cyanosis clubbing or edema  Neuro: AAOx3, nonfocal  Psych: appropriate mood and affect,  pleasant  Data Reviewed: I have personally reviewed following labs and imaging studies  CBC: Recent Labs  Lab 06/17/20 0400 06/18/20 0332 06/18/20 1319 06/19/20 0436 06/20/20 0400 06/21/20 0849 06/22/20 0443  WBC 5.6   < > 13.0* 12.3* 10.9* 9.1 7.2  NEUTROABS 3.8  --   --   --   --   --   --   HGB 10.2*   < > 10.0* 8.8* 9.4* 9.3* 8.5*  HCT 33.9*   < > 32.6* 28.9* 29.3* 31.7* 28.3*  MCV 87.4   < > 86.9 86.3 85.4 88.3 87.9  PLT 170   < > 141* 138* 153 97* 71*   < > = values in this interval not displayed.   Basic Metabolic Panel: Recent Labs  Lab 06/17/20 0400 06/18/20 0332 06/18/20 1102 06/18/20 1319 06/19/20 0436 06/20/20 0400 06/21/20 0320 06/23/20 0732  NA 135 135   < > 133* 132* 134* 133* 134*  K 3.5 4.1   < > 4.1 4.8 4.3 4.1 4.2  CL 99 100  --  101 99 96* 102 101  CO2 29 28  --  23 26 29 23 25   GLUCOSE 114* 101*  --  217* 136* 137* 140* 119*  BUN 5* 7  --  9 14 15 11 14   CREATININE 0.78 0.76  --  0.82 0.74 0.88 0.70 0.75  CALCIUM 8.8* 8.6*  --  8.3* 8.3* 8.8* 8.6* 8.6*  MG 1.8 2.0  --   --  2.0 1.9  --   --   PHOS 3.9 4.3  --   --  3.7 3.5  --   --    < > = values in this interval not displayed.   GFR: Estimated Creatinine Clearance: 98.9 mL/min (by C-G formula based on SCr of 0.75 mg/dL). Liver Function Tests: Recent Labs  Lab 06/17/20 0400 06/20/20 0400  AST 15 19  ALT 19 20  ALKPHOS 87 88  BILITOT 0.7 0.7  PROT 5.6* 5.7*  ALBUMIN 2.8* 2.7*   No results for input(s): LIPASE, AMYLASE in the last 168 hours. No results for input(s): AMMONIA in the last 168 hours. Coagulation Profile: No results for input(s): INR, PROTIME in the last 168 hours. Cardiac Enzymes: No results for input(s): CKTOTAL, CKMB, CKMBINDEX, TROPONINI in the last 168 hours. BNP (last 3 results) No results for input(s): PROBNP in the last 8760 hours. HbA1C: No results for input(s): HGBA1C in the last 72 hours. CBG: Recent Labs  Lab 06/22/20 2033 06/22/20 2337 06/23/20 0329  06/23/20 0745 06/23/20 1112  GLUCAP 111* 115* 108* 84 95   Lipid Profile: Recent Labs    06/22/20 0935  TRIG 124   Thyroid Function Tests: No results for input(s): TSH, T4TOTAL, FREET4, T3FREE, THYROIDAB in the last 72 hours. Anemia Panel: No results for input(s): VITAMINB12, FOLATE, FERRITIN, TIBC, IRON, RETICCTPCT in the last 72 hours. Urine analysis:    Component Value Date/Time   COLORURINE AMBER (A) 05/29/2020 0951   APPEARANCEUR HAZY (A) 05/29/2020 0951   LABSPEC 1.028 05/29/2020 0951   PHURINE 5.0 05/29/2020 0951   GLUCOSEU NEGATIVE 05/29/2020 0951   HGBUR NEGATIVE 05/29/2020 0951   BILIRUBINUR NEGATIVE 05/29/2020 0951   KETONESUR 5 (A) 05/29/2020 0951   PROTEINUR 30 (A) 05/29/2020 0951   UROBILINOGEN 0.2 09/09/2016 1206   NITRITE NEGATIVE 05/29/2020 0951   LEUKOCYTESUR NEGATIVE 05/29/2020 0951   Sepsis Labs: @LABRCNTIP (procalcitonin:4,lacticidven:4)  )No results found for this or any previous visit (from the past 240 hour(s)).    Radiology Studies: No results found.   Scheduled Meds: . acetaminophen  1,000 mg Oral Q6H  . Chlorhexidine Gluconate Cloth  6 each Topical Daily  . docusate sodium  100 mg Oral BID  . lip balm  1 application Topical BID  . polycarbophil  625 mg Oral BID   Continuous Infusions: . diltiazem (CARDIZEM) infusion 5 mg/hr (06/23/20 0756)  . heparin 1,800 Units/hr (06/23/20 0949)  . methocarbamol (ROBAXIN) IV 1,000 mg (06/20/20 0347)  . TPN ADULT (ION) 40 mL/hr at 06/23/20 0949     LOS: 17 days   Time Spent in minutes   45 minutes  Seira Cody D.O. on 06/23/2020 at 11:34 AM  Between 7am to 7pm - Please see pager noted on [email or web address]  After 7pm go to [email or web address]  And look for the night coverage person covering for me after hours  [place]  [phone]

## 2020-06-23 NOTE — Progress Notes (Signed)
5 Days Post-Op   Subjective/Chief Complaint: Feels better. No nausea. Tolerated clears   Objective: Vital signs in last 24 hours: Temp:  [97.8 F (36.6 C)-99.2 F (37.3 C)] 97.8 F (36.6 C) (02/06 0742) Pulse Rate:  [71-82] 76 (02/06 0742) Resp:  [18-20] 18 (02/06 0742) BP: (102-113)/(53-71) 113/70 (02/06 0742) SpO2:  [96 %-100 %] 100 % (02/06 0742) Weight:  [110.8 kg] 110.8 kg (02/06 0330) Last BM Date: 06/22/20  Intake/Output from previous day: 02/05 0701 - 02/06 0700 In: -  Out: 210 [Drains:210] Intake/Output this shift: No intake/output data recorded.  General appearance: alert and cooperative Resp: clear to auscultation bilaterally Cardio: irregularly irregular rhythm GI: soft, tender near incision. vac in place. ostomy pink and productive  Lab Results:  Recent Labs    06/21/20 0849 06/22/20 0443  WBC 9.1 7.2  HGB 9.3* 8.5*  HCT 31.7* 28.3*  PLT 97* 71*   BMET Recent Labs    06/21/20 0320 06/23/20 0732  NA 133* 134*  K 4.1 4.2  CL 102 101  CO2 23 25  GLUCOSE 140* 119*  BUN 11 14  CREATININE 0.70 0.75  CALCIUM 8.6* 8.6*   PT/INR No results for input(s): LABPROT, INR in the last 72 hours. ABG No results for input(s): PHART, HCO3 in the last 72 hours.  Invalid input(s): PCO2, PO2  Studies/Results: No results found.  Anti-infectives: Anti-infectives (From admission, onward)   Start     Dose/Rate Route Frequency Ordered Stop   06/18/20 1730  piperacillin-tazobactam (ZOSYN) IVPB 3.375 g  Status:  Discontinued        3.375 g 12.5 mL/hr over 240 Minutes Intravenous Every 8 hours 06/18/20 1644 06/18/20 1644   06/18/20 1730  piperacillin-tazobactam (ZOSYN) IVPB 3.375 g        3.375 g 12.5 mL/hr over 240 Minutes Intravenous Every 8 hours 06/18/20 1644 06/23/20 1359   06/14/20 0000  ciprofloxacin (CIPRO) 500 MG tablet        500 mg Oral 2 times daily 06/14/20 1100 06/24/20 2359   06/14/20 0000  metroNIDAZOLE (FLAGYL) 500 MG tablet        500 mg  Oral 3 times daily 06/14/20 1100 06/24/20 2359   06/11/20 1230  ertapenem (INVANZ) 1,000 mg in sodium chloride 0.9 % 100 mL IVPB  Status:  Discontinued        1 g 200 mL/hr over 30 Minutes Intravenous Every 24 hours 06/11/20 1114 06/18/20 1644   06/06/20 1850  piperacillin-tazobactam (ZOSYN) IVPB 3.375 g  Status:  Discontinued        3.375 g 12.5 mL/hr over 240 Minutes Intravenous Every 8 hours 06/06/20 1850 06/11/20 1114   06/06/20 1830  piperacillin-tazobactam (ZOSYN) IVPB 3.375 g        3.375 g 100 mL/hr over 30 Minutes Intravenous  Once 06/06/20 1818 06/06/20 2039      Assessment/Plan: s/p Procedure(s): SIGMOID COLECTOMY WITH COLOSTOMY CREATION; DRAINAGE OF PELVIC ABSCESS (N/A) EXPLORATORY LAPAROTOMY (N/A) CYSTOSCOPY WITH STENT PLACEMENT (Bilateral) Advance diet. Allow fulls and advance to soft tonight if she tolerates Wean tpn to off Paroxysmal A. Fib on Eliquis at home  OSA HTN COVID + - off precautions  No prior colonoscopy   Acutesigmoiddiverticulitis  S/p Ex-lap, left colectomy, end colostomy, SBR, drain placement - Dr. Donne Hazel, 06/18/2020 - POD #5 - Pathbenign. -ileus resolving. Advance diet - Cont TPNuntiltolerating a diet  - Cont IV abx through 2/6 - Cont JP drain - Vac to midline. M/W/F,WOCN following for new ostomy - OOB,  ambulate/mobilize, PT has seen and recommending no f/u - Pulm toilet - AM labs   FEN -sips/chips. TPN VTE - SCDs,hep gtt ID - Zosyn through 2/6 Foley - d/c-ed 2/3, ,external cath Follow-up - Dr. Donne Hazel   LOS: 17 days    Tammy Davies 06/23/2020

## 2020-06-23 NOTE — Progress Notes (Signed)
Physical Therapy Treatment Patient Details Name: Tammy Davies MRN: [id number] DOB: 1960/10/04 Today's Date: 06/23/2020    History of Present Illness Pt is a 60 y.o. F with significant PMH of paroxysmal A. fib on anticoaguation, tobacco use, HTN, admitted for abdominal pain. CT abdomen pelvis showed sigmoid diverticulitis with small abscess. Patient went for exploratory laparotomy with left colectomy, end colostomy and small bowel resection by Dr. Donne Hazel 06/18/2020.    PT Comments    Pt continues to make progress towards her goals with mobility as she was able to ambulate an increased distance of ~350 ft without any AD/AE this date. However, she does continue to have endurance deficits resulting in her requiring several standing rest breaks to go this far and displaying and reporting increased leg weakness with increased distance. Practiced 12 stairs with simulated use of rails to home with pt able to negotiate them safely with increased time. Will continue to follow acutely. Current recommendations remain appropriate.  Follow Up Recommendations  No PT follow up;Supervision for mobility/OOB     Equipment Recommendations  None recommended by PT    Recommendations for Other Services       Precautions / Restrictions Precautions Precautions: Fall;Other (comment) Precaution Comments: colostomy, wound vac, JP drain Restrictions Weight Bearing Restrictions: No    Mobility  Bed Mobility Overal bed mobility: Needs Assistance Bed Mobility: Sit to Supine       Sit to supine: Supervision   General bed mobility comments: Supervision for safety with managing lines and leads.  Transfers Overall transfer level: Needs assistance Equipment used: Rolling walker (2 wheeled) Transfers: Sit to/from Stand Sit to Stand: Supervision         General transfer comment: Sit to stand from recliner 1x with supervision for safety, no LOB.  Ambulation/Gait Ambulation/Gait assistance:  Supervision;Min guard Gait Distance (Feet): 350 Feet (x4 short standing rest breaks) Assistive device: None Gait Pattern/deviations: Step-through pattern;Decreased stride length Gait velocity: decreased Gait velocity interpretation: 1.31 - 2.62 ft/sec, indicative of limited community ambulator General Gait Details: Ambulates without LOB and supervision for safety initially but provided min guard as distance progressed and pt appeared and reported to feel weaker in her legs. x4 short standing rest breaks due to fatigue. Cued pt to increase speed, but no success this date as pt fatigued.   Stairs Stairs: Yes Stairs assistance: Min guard Stair Management: One rail Left;Step to pattern;One rail Right;Alternating pattern;Forwards Number of Stairs: 12 (multiple times up and down on 2 steps at bottom of set of stairs due to lines/leads) General stair comments: no LOB, min guard for safety. Utilized R rail ascending and L descending to simulate home set-up. Ascends with reciprocal gait pattern and descends with step-to gait pattern.   Wheelchair Mobility    Modified Rankin (Stroke Patients Only)       Balance Overall balance assessment: Needs assistance Sitting-balance support: Feet supported Sitting balance-Leahy Scale: Good     Standing balance support: No upper extremity supported Standing balance-Leahy Scale: Good Standing balance comment: Ambulates without UE support and supervision-min guard for safety.                            Cognition Arousal/Alertness: Awake/alert Behavior During Therapy: WFL for tasks assessed/performed Overall Cognitive Status: Within Functional Limits for tasks assessed  General Comments: motivated, participatory      Exercises      General Comments        Pertinent Vitals/Pain Pain Assessment: Faces Faces Pain Scale: Hurts a little bit Pain Location: incisional Pain Descriptors /  Indicators: Discomfort;Guarding;Grimacing Pain Intervention(s): Limited activity within patient's tolerance;Monitored during session;Repositioned    Home Living                      Prior Function            PT Goals (current goals can now be found in the care plan section) Acute Rehab PT Goals Patient Stated Goal: return home and to work PT Goal Formulation: With patient Time For Goal Achievement: 07/03/20 Potential to Achieve Goals: Good Progress towards PT goals: Progressing toward goals    Frequency    Min 3X/week      PT Plan Current plan remains appropriate;Equipment recommendations need to be updated    Co-evaluation              AM-PAC PT "6 Clicks" Mobility   Outcome Measure  Help needed turning from your back to your side while in a flat bed without using bedrails?: None Help needed moving from lying on your back to sitting on the side of a flat bed without using bedrails?: A Little Help needed moving to and from a bed to a chair (including a wheelchair)?: A Little Help needed standing up from a chair using your arms (e.g., wheelchair or bedside chair)?: A Little Help needed to walk in hospital room?: A Little Help needed climbing 3-5 steps with a railing? : A Little 6 Click Score: 19    End of Session Equipment Utilized During Treatment: Gait belt Activity Tolerance: Patient tolerated treatment well Patient left: with call bell/phone within reach;in bed   PT Visit Diagnosis: Pain;Difficulty in walking, not elsewhere classified (R26.2);Unsteadiness on feet (R26.81) Pain - part of body:  (abdomen)     Time: 1219-7588 PT Time Calculation (min) (ACUTE ONLY): 18 min  Charges:  $Gait Training: 8-22 mins                     Moishe Spice, PT, DPT Acute Rehabilitation Services  Pager: [phone] Office: [place] 06/23/2020, 12:29 PM

## 2020-06-23 NOTE — Progress Notes (Signed)
Addendum: follow-up platelet level 68 which has been trending down since admission. Discussed w/ TRH, will work-up for HIT and transition to argatroban.   ANTICOAGULATION CONSULT NOTE - Follow Up Consult  Pharmacy Consult for IV Heparin Indication: atrial fibrillation  Labs: Recent Labs    06/21/20 0320 06/21/20 0511 06/21/20 0849 06/22/20 0443 06/22/20 1148 06/22/20 2123 06/23/20 0732 06/23/20 1422  HGB  --    < > 9.3* 8.5*  --   --   --  8.8*  HCT  --   --  31.7* 28.3*  --   --   --  28.8*  PLT  --   --  97* 71*  --   --   --  68*  HEPARINUNFRC  --    < >  --  0.92*   < > 0.60 0.64 0.55  CREATININE 0.70  --   --   --   --   --  0.75  --    < > = values in this interval not displayed.    Assessment: Pt was recently discharged from [facility] for diverticulitis with possible abscess. Pt now has epigastric pain with worsening vomiting. She is on chronic apixaban for afib (last dose on 1/19). Plan is to hold apixaban and bridge with IV heparin. Pharmacy was consulted to restart IV heparin on 06/20/20 as cleared from surgical team.   POD #3 S/p Ex-lap, left colectomy, end colostomy, SBR, drain placement.   Heparin level 0.64 units/ml, still above desired goal. Will make slight rate adjustment. No s/sx bleeding noted.  Goal of Therapy:  Heparin level 0.3-0.5 units/ml Monitor platelets by anticoagulation protocol: Yes   Plan:  Reduce heparin infusion rate to 1,800 units/hr Follow up with 6-hr HL then daily  Follow CBC, s/sx bleeding, and plans for oral [facility]  Thanks for allowing pharmacy to be a part of this patient's care.  Mercy Riding, PharmD PGY1 Acute Care Pharmacy Resident Please refer to [facility] for unit-specific pharmacist

## 2020-06-23 NOTE — Progress Notes (Addendum)
PHARMACY - TOTAL PARENTERAL NUTRITION CONSULT NOTE   Indication: Prolonged ileus  Patient Measurements: Height: 5' 8"  (172.7 cm) Weight: 110.8 kg (244 lb 4.3 oz) IBW/kg (Calculated) : 63.9 TPN AdjBW (KG): 74.9 Body mass index is 37.14 kg/m.  Assessment: 60 years of age female with past medical history of paroxysmal atrial fibrillation and tobacco use admitted on 06/05/20 with 1 month history of abdominal pain treated outpatient as constipation and uncomplicated diverticulitis. Pain progressively worsening despite antibiotic treatment outpatient. 06/05/20 CT showed sigmoid diverticulitis. On 06/15/20, patient symptoms changed to epigastric pain and worsening vomiting. Patient was made NPO. Patient was eating 50-100% of meals up until 06/13/20. CT 1/29 with worsening sigmoid diverticulitis and some degree of obstruction. Plan is to proceed with surgery 2/1 for bowel resection with colostomy. Pharmacy consulted for TPN given prolonged ileus.  Glucose / Insulin: No hx DM (A1c 5.6). CBGs controlled. SSI d/c'd due to lack of use Electrolytes: Na 134; others stable WNL Renal: SCr 0.75 - stable. BUN wnl.  LFTs / TGs: LFTs / Tbili / TG wnl Prealbumin / albumin: Albumin 2.7. Prealbumin 13.4 Intake / Output; MIVF: Colostomy output not charted overnight. R JP drain output 136m/24hrs GI Imaging: 1/20 CT Abd: persistent sigmoid diverticulitis, no drainable fluid collection or abscess 1/29: CT Abd: worsening sigmoid diverticulitis, some degree of obstruction, no evidence for perforation or abscess, likely colostomy Surgeries / Procedures: 2/1: ex lap with left colectomy, end colostomy and small bowel resection Central access: PICC 06/16/20 TPN start date: 06/16/20  Nutritional Goals (per RD recommendations on 1/31): 2000-2200 kcal, 100-110 g protein, fluid > 2L/day Goal TPN rate 85 ml/hr - providing 110g protein daily and with 24 g/L lipids MWF, for weekly average of 2038 kcals/day  Current Nutrition:   TPN Advancing to FLD 2/6 with plans to further advance to soft diet tonight if tolerates per Surgery (no nausea and tolerated CLD 2/5)  Plan:  Wean TPN to off today per Surgery. Dr. TMarlou Starksalready reduced rate to 40 ml/hr and to d/c at 1800 tonight - communicated plan with RN D/c TPN labs and RN orders [facility]  HArturo Morton PharmD, BCPS Please check AMION for all MLarimercontact numbers Clinical Pharmacist 06/23/2020 8:40 AM

## 2020-06-23 NOTE — Progress Notes (Signed)
ANTICOAGULATION CONSULT NOTE - Follow Up Consult  Pharmacy Consult for IV Heparin Indication: atrial fibrillation  Labs: Recent Labs    06/21/20 0320 06/21/20 0511 06/21/20 0849 06/22/20 0443 06/22/20 1148 06/22/20 2123 06/23/20 0732  HGB  --   --  9.3* 8.5*  --   --   --   HCT  --   --  31.7* 28.3*  --   --   --   PLT  --   --  97* 71*  --   --   --   HEPARINUNFRC  --    < >  --  0.92* 0.69 0.60 0.64  CREATININE 0.70  --   --   --   --   --  0.75   < > = values in this interval not displayed.    Assessment: Pt was recently discharged from [facility] for diverticulitis with possible abscess. Pt now has epigastric pain with worsening vomiting. She is on chronic apixaban for afib (last dose on 1/19). Plan is to hold apixaban and bridge with IV heparin. Pharmacy was consulted to restart IV heparin on 06/20/20 as cleared from surgical team.   POD #3 S/p Ex-lap, left colectomy, end colostomy, SBR, drain placement.   Heparin level 0.64 units/ml, still above desired goal. Will make slight rate adjustment. No s/sx bleeding noted.  Goal of Therapy:  Heparin level 0.3-0.5 units/ml Monitor platelets by anticoagulation protocol: Yes   Plan:  Reduce heparin infusion rate to 1,800 units/hr Follow up with 6-hr HL then daily  Follow CBC, s/sx bleeding, and plans for oral [facility]  Thanks for allowing pharmacy to be a part of this patient's care.  Mercy Riding, PharmD PGY1 Acute Care Pharmacy Resident Please refer to [facility] for unit-specific pharmacist

## 2020-06-23 NOTE — Progress Notes (Addendum)
ANTICOAGULATION CONSULT NOTE - Follow Up Consult  Pharmacy Consult for ARGATROBAN Indication: atrial fibrillation  Labs: Recent Labs    06/21/20 0320 06/21/20 0511 06/21/20 0849 06/22/20 0443 06/22/20 1148 06/22/20 2123 06/23/20 0732 06/23/20 1422 06/23/20 2100  HGB  --    < > 9.3* 8.5*  --   --   --  8.8*  --   HCT  --   --  31.7* 28.3*  --   --   --  28.8*  --   PLT  --   --  97* 71*  --   --   --  68*  --   APTT  --   --   --   --   --   --   --   --  51*  HEPARINUNFRC  --    < >  --  0.92*   < > 0.60 0.64 0.55  --   CREATININE 0.70  --   --   --   --   --  0.75  --   --    < > = values in this interval not displayed.    Assessment: Pt was recently discharged from [facility] for diverticulitis with possible abscess. Pt now has epigastric pain with worsening vomiting. She is on chronic apixaban for afib (last dose on 1/19). Plan is to hold apixaban and bridge with IV heparin. Pharmacy was consulted to restart IV heparin on 06/20/20 as cleared from surgical team.   POD #3 S/p Ex-lap, left colectomy, end colostomy, SBR, drain placement.   Follow-up platelet level 68 which has been trending down since admission. Discussed w/ TRH, will work-up for HIT and transition to argatroban.  Initial aPTT 51 sec  Goal of Therapy:  APTT 50-80 sec Monitor platelets by anticoagulation protocol: Yes   Plan:  Continue argatroban 0.5 mcg/kg/min.   Check aPTT at 04:00 Follow CBC, s/sx bleeding, and plans for oral [facility]  Thanks for allowing pharmacy to be a part of this patient's care.  Excell Seltzer, PharmD Clinical Pharmacist

## 2020-06-24 DIAGNOSIS — Z933 Colostomy status: Secondary | ICD-10-CM | POA: Diagnosis not present

## 2020-06-24 DIAGNOSIS — N3091 Cystitis, unspecified with hematuria: Secondary | ICD-10-CM | POA: Diagnosis not present

## 2020-06-24 DIAGNOSIS — Z7901 Long term (current) use of anticoagulants: Secondary | ICD-10-CM | POA: Diagnosis not present

## 2020-06-24 DIAGNOSIS — K5720 Diverticulitis of large intestine with perforation and abscess without bleeding: Secondary | ICD-10-CM | POA: Diagnosis not present

## 2020-06-24 LAB — GLUCOSE, CAPILLARY
Glucose-Capillary: 100 mg/dL — ABNORMAL HIGH (ref 70–99)
Glucose-Capillary: 115 mg/dL — ABNORMAL HIGH (ref 70–99)
Glucose-Capillary: 81 mg/dL (ref 70–99)
Glucose-Capillary: 82 mg/dL (ref 70–99)
Glucose-Capillary: 88 mg/dL (ref 70–99)
Glucose-Capillary: 96 mg/dL (ref 70–99)

## 2020-06-24 LAB — CBC
HCT: 27.1 % — ABNORMAL LOW (ref 36.0–46.0)
Hemoglobin: 8.5 g/dL — ABNORMAL LOW (ref 12.0–15.0)
MCH: 26.9 pg (ref 26.0–34.0)
MCHC: 31.4 g/dL (ref 30.0–36.0)
MCV: 85.8 fL (ref 80.0–100.0)
Platelets: 73 10*3/uL — ABNORMAL LOW (ref 150–400)
RBC: 3.16 MIL/uL — ABNORMAL LOW (ref 3.87–5.11)
RDW: 17 % — ABNORMAL HIGH (ref 11.5–15.5)
WBC: 6.8 10*3/uL (ref 4.0–10.5)
nRBC: 0 % (ref 0.0–0.2)

## 2020-06-24 LAB — HEPARIN INDUCED PLATELET AB (HIT ANTIBODY): Heparin Induced Plt Ab: 3.066 OD — ABNORMAL HIGH (ref 0.000–0.400)

## 2020-06-24 LAB — APTT
aPTT: 46 seconds — ABNORMAL HIGH (ref 24–36)
aPTT: 55 seconds — ABNORMAL HIGH (ref 24–36)

## 2020-06-24 MED ORDER — DILTIAZEM HCL 60 MG PO TABS
30.0000 mg | ORAL_TABLET | Freq: Four times a day (QID) | ORAL | Status: DC
  Administered 2020-06-24 – 2020-06-27 (×12): 30 mg via ORAL
  Filled 2020-06-24 (×12): qty 1

## 2020-06-24 MED ORDER — ALTEPLASE 2 MG IJ SOLR
2.0000 mg | Freq: Once | INTRAMUSCULAR | Status: AC
  Administered 2020-06-24: 2 mg

## 2020-06-24 MED ORDER — METHOCARBAMOL 500 MG PO TABS
500.0000 mg | ORAL_TABLET | Freq: Three times a day (TID) | ORAL | Status: DC
  Administered 2020-06-24 – 2020-06-28 (×12): 500 mg via ORAL
  Filled 2020-06-24 (×13): qty 1

## 2020-06-24 MED ORDER — OXYCODONE HCL 5 MG PO TABS
5.0000 mg | ORAL_TABLET | ORAL | Status: DC | PRN
  Administered 2020-06-24: 5 mg via ORAL
  Administered 2020-06-24 – 2020-06-25 (×2): 10 mg via ORAL
  Administered 2020-06-25: 5 mg via ORAL
  Administered 2020-06-25 – 2020-06-27 (×5): 10 mg via ORAL
  Filled 2020-06-24 (×2): qty 2
  Filled 2020-06-24: qty 1
  Filled 2020-06-24 (×4): qty 2
  Filled 2020-06-24: qty 1
  Filled 2020-06-24 (×2): qty 2

## 2020-06-24 MED ORDER — NAPROXEN 375 MG PO TABS
375.0000 mg | ORAL_TABLET | Freq: Two times a day (BID) | ORAL | Status: DC
  Administered 2020-06-24: 375 mg via ORAL
  Filled 2020-06-24 (×3): qty 1

## 2020-06-24 MED ORDER — ADULT MULTIVITAMIN W/MINERALS CH
1.0000 | ORAL_TABLET | Freq: Every day | ORAL | Status: DC
  Administered 2020-06-24 – 2020-06-28 (×5): 1 via ORAL
  Filled 2020-06-24 (×4): qty 1

## 2020-06-24 MED ORDER — ENSURE ENLIVE PO LIQD
237.0000 mL | Freq: Three times a day (TID) | ORAL | Status: DC
  Administered 2020-06-24 (×2): 237 mL via ORAL

## 2020-06-24 NOTE — Progress Notes (Addendum)
6 Days Post-Op    CC: Abdominal pain  Subjective: Patient is sitting up in bed and actually looks pretty good. She is not eating much of the soft diet so far, but has had no nausea or discomfort with p.o.'s. Her ostomy is working with stool and gas in the bag. The ostomy has some swelling but overall looks fine. The JP drain is serous fluid. She has a wound VAC in place. 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 She would like to go home with a wound VAC if possible.  Objective: Vital signs in last 24 hours: Temp:  [97.7 F (36.5 C)-99.2 F (37.3 C)] 99 F (37.2 C) (02/07 0803) Pulse Rate:  [77-121] 88 (02/07 0803) Resp:  [17-20] 18 (02/07 0803) BP: (104-131)/(61-113) 124/73 (02/07 0803) SpO2:  [96 %-100 %] 97 % (02/07 0803) Weight:  [110.8 kg] 110.8 kg (02/07 0406) Last BM Date: 06/23/20 PO not recorded 31.7 IV recorded 1100 urine Stool 100 Afebrile, VSS one BB 131/113 with HR 121 recorded WBC 6.8 H/H 8.5/27.1 Platelets 73K  Intake/Output from previous day: 02/06 0701 - 02/07 0700 In: 31.7 [I.V.:31.7] Out: 1975 [Urine:1750; Drains:125; Stool:100] Intake/Output this shift: Total I/O In: -  Out: 2000 [Urine:2000]  General appearance: alert, cooperative and no distress Resp: clear to auscultation bilaterally GI: Soft, still fairly sore. Taking a fair amount of pain medicine. Positive bowel sounds with stool from suctioning ostomy. Tolerating low volume of a soft diet.   Wound looks good, plan home wound vac Lab Results:  Recent Labs    06/23/20 1422 06/24/20 0500  WBC 7.5 6.8  HGB 8.8* 8.5*  HCT 28.8* 27.1*  PLT 68* 73*    BMET Recent Labs    06/23/20 0732  NA 134*  K 4.2  CL 101  CO2 25  GLUCOSE 119*  BUN 14  CREATININE 0.75  CALCIUM 8.6*   PT/INR No results for input(s): LABPROT, INR in the last 72 hours.  Recent Labs  Lab 06/20/20 0400  AST 19  ALT 20  ALKPHOS 88   BILITOT 0.7  PROT 5.7*  ALBUMIN 2.7*     Lipase     Component Value Date/Time   LIPASE 19 06/16/2020 0406     Medications: . acetaminophen  1,000 mg Oral Q6H  . Chlorhexidine Gluconate Cloth  6 each Topical Daily  . docusate sodium  100 mg Oral BID  . lip balm  1 application Topical BID  . polycarbophil  625 mg Oral BID   . argatroban 0.6 mcg/kg/min (06/24/20 9563)  . diltiazem (CARDIZEM) infusion 5 mg/hr (06/23/20 0756)  . methocarbamol (ROBAXIN) IV 1,000 mg (06/20/20 0347)    Assessment/Plan Advance diet. Allow fulls and advance to soft tonight if she tolerates Wean tpn to off Paroxysmal A. Fib on Eliquis at home  OSA HTN COVID + - off precautions  No prior colonoscopy  Thrombocytopenia  Acutesigmoiddiverticulitis  S/p Ex-lap, left colectomy, end colostomy, SBR, drain placement - Dr. Donne Hazel, 06/18/2020 - POD #6 - Pathbenign. -Check on removing JP drain - Vac to midline. M/W/F,WOCN following for new ostomy -will look at midline incision with ostomy    nurse and VAC change. - OOB, ambulate/mobilize, PT has seen and recommending no f/u - Pulm toilet - AM labs   FEN - soft diet VTE - SCDs,argatroban  ID - Zosyn through 2/1-06/23/20 Foley - d/c-ed 2/3, ,external cath Pain:  Tylenol 1  gm x 3,Dilaudid 1 mg x 5 yesterday Follow-up - Dr. Donne Hazel   Plan: Continue soft diet for now. We will look at the midline incision with wound care when they do the VAC change. I have asked TOC to set her up for home health with colostomy teaching/assistance, and also wound VAC(KCl). PT saw her on 06/20/2020, and only recommended a rolling walker. I'm going to add oxycodone, Naprosyn, and Robaxin p.o. to her pain medications. She should be able to go home when medically stable, and after we reviewed the midline wound. She has a postop appointment with Dr. Donne Hazel on 07/16/2020. She also has discharge instructions in the AVS, for a low fiber diet, postop open abdominal surgery,  wound VAC, and colostomy care.    LOS: 18 days    Safia Panzer 06/24/2020 Please see Amion

## 2020-06-24 NOTE — Progress Notes (Signed)
Occupational Therapy Treatment Patient Details Name: Tammy Davies MRN: [id number] DOB: 06/05/1960 Today's Date: 06/24/2020    History of present illness Pt is a 60 y.o. F with significant PMH of paroxysmal A. fib on anticoaguation, tobacco use, HTN, admitted for abdominal pain. CT abdomen pelvis showed sigmoid diverticulitis with small abscess. Patient went for exploratory laparotomy with left colectomy, end colostomy and small bowel resection by Dr. Donne Davies 06/18/2020.   OT comments  Pt making steady progress towards OT goals this session. Overall, pt requires supervision for functional mobility with no AD. Pt completing toileting tasks with supervision and UB/LB ADLs with supervision. Pt limited by increased HR this session with HR increasing to as much as 158 bpm with mobility however pt asymptomatic and HR decreases quickly with seated rest break. DC plan remains appropriate, will follow acutely per POC.    Follow Up Recommendations  No OT follow up    Equipment Recommendations  None recommended by OT    Recommendations for Other Services      Precautions / Restrictions Precautions Precautions: Fall;Other (comment) Precaution Comments: colostomy, wound vac, JP drain, watch HR Restrictions Weight Bearing Restrictions: No       Mobility Bed Mobility Overal bed mobility: Needs Assistance Bed Mobility: Supine to Sit;Sit to Supine     Supine to sit: Modified independent (Device/Increase time);HOB elevated Sit to supine: Modified independent (Device/Increase time);HOB elevated   General bed mobility comments: assist of elevated HOB and use of rail, no physical assist needed  Transfers Overall transfer level: Needs assistance Equipment used: None Transfers: Sit to/from Stand Sit to Stand: Supervision         General transfer comment: supervision for safety from EOB and toilet mostly for line mgmt    Balance Overall balance assessment: Needs  assistance Sitting-balance support: Feet supported Sitting balance-Leahy Scale: Good Sitting balance - Comments: sitting EOB   Standing balance support: No upper extremity supported Standing balance-Leahy Scale: Good Standing balance comment: able to ambulate with no UE support and complete UB grooming with no UE support with no LOB                           ADL either performed or assessed with clinical judgement   ADL Overall ADL's : Needs assistance/impaired     Grooming: Wash/dry face;Standing;Supervision/safety       Lower Body Bathing: Set up;Sitting/lateral leans Lower Body Bathing Details (indicate cue type and reason): simulated via LB dressing Upper Body Dressing : Set up;Sitting Upper Body Dressing Details (indicate cue type and reason): to don gown as back side cover Lower Body Dressing: Supervision/safety;Set up;Sitting/lateral leans Lower Body Dressing Details (indicate cue type and reason): to don socks from EOB Toilet Transfer: Supervision/safety;Ambulation;Regular Glass blower/designer Details (indicate cue type and reason): supervision for safety mostly for ling mgmt Toileting- Clothing Manipulation and Hygiene: Supervision/safety;Sitting/lateral lean Toileting - Clothing Manipulation Details (indicate cue type and reason): supervision for safety     Functional mobility during ADLs: Supervision/safety General ADL Comments: pt making good progress toward OT goals, able to complete functional mobility greater than a household distanc with no AD and close supervision, toileting tasks with supervision, LB dressing and  UB grooming with supervision. pt limited by irregular HR with increase to 158 bpm however pt asymptomatic     Vision       Perception     Praxis      Cognition Arousal/Alertness: Awake/alert Behavior During Therapy: [facility]  for tasks assessed/performed Overall Cognitive Status: Within Functional Limits for tasks assessed                                           Exercises     Shoulder Instructions       General Comments pt HR increase to as much as 158 bpm with mobility, pt asymptomatic and HR decreased quickly with seated rest break, pt on RA during session sats [facility]    Pertinent Vitals/ Pain       Pain Assessment: Faces Faces Pain Scale: Hurts a little bit Pain Location: incisional Pain Descriptors / Indicators: Discomfort;Guarding;Grimacing Pain Intervention(s): Monitored during session;Repositioned  Home Living                                          Prior Functioning/Environment              Frequency  Min 1X/week        Progress Toward Goals  OT Goals(current goals can now be found in the care plan section)  Progress towards OT goals: Progressing toward goals  Acute Rehab OT Goals Patient Stated Goal: return home and to work OT Goal Formulation: With patient Time For Goal Achievement: 07/04/20 Potential to Achieve Goals: Good  Plan Discharge plan remains appropriate;Frequency remains appropriate    Co-evaluation                 AM-PAC OT "6 Clicks" Daily Activity     Outcome Measure   Help from another person eating meals?: None Help from another person taking care of personal grooming?: None Help from another person toileting, which includes using toliet, bedpan, or urinal?: A Little Help from another person bathing (including washing, rinsing, drying)?: A Little Help from another person to put on and taking off regular upper body clothing?: None Help from another person to put on and taking off regular lower body clothing?: None 6 Click Score: 22    End of Session    OT Visit Diagnosis: Unsteadiness on feet (R26.81);Pain   Activity Tolerance Patient tolerated treatment well   Patient Left in bed;with call bell/phone within reach   Nurse Communication Mobility status        Time: 5885-0277 OT Time Calculation (min): 17  min  Charges: OT General Charges $OT Visit: 1 Visit OT Treatments $Self Care/Home Management : 8-22 mins  Tammy Davies., COTA/L Acute Rehabilitation Services [phone]    Tammy Davies 06/24/2020, 10:21 AM

## 2020-06-24 NOTE — Progress Notes (Signed)
ANTICOAGULATION CONSULT NOTE - Follow Up Consult  Pharmacy Consult for ARGATROBAN Indication: atrial fibrillation  Labs: Recent Labs    06/22/20 0443 06/22/20 1148 06/22/20 2123 06/23/20 0732 06/23/20 1422 06/23/20 2100 06/24/20 0400 06/24/20 0500 06/24/20 1357  HGB 8.5*  --   --   --  8.8*  --   --  8.5*  --   HCT 28.3*  --   --   --  28.8*  --   --  27.1*  --   PLT 71*  --   --   --  68*  --   --  73*  --   APTT  --   --   --   --   --  51* 46*  --  55*  HEPARINUNFRC 0.92*   < > 0.60 0.64 0.55  --   --   --   --   CREATININE  --   --   --  0.75  --   --   --   --   --    < > = values in this interval not displayed.    Assessment: Pt was recently discharged from [facility] for diverticulitis with possible abscess. Pt now has epigastric pain with worsening vomiting. She is on chronic apixaban for afib (last dose on 1/19). Plan is to hold apixaban and bridge with IV heparin. Pharmacy was consulted to restart IV heparin on 06/20/20 as cleared from surgical team.   2/1 S/p Ex-lap, left colectomy, end colostomy, SBR, drain placement.   APTT therapeutic at 55 sec) on argatroban 0.6 mcg/kg/min. No issues with line or bleeding reported. H/H, plt low stable. 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  Benetta Spar, PharmD, BCPS, BCCP Clinical Pharmacist  Please check AMION for all [place] phone numbers After 10:00 PM, call [place] [phone]

## 2020-06-24 NOTE — Progress Notes (Signed)
Noted referral for home wound VAC needs- pt is uninsured and will need to be approved for a charity home VAC with [employer]- forms have been placed on shadow chart for MD signature- 2 forms to sign to begin process for approval- once signed-TOC will f/u and fax in to start approval process- TOC will also f/u with pt for further TOC transition needs HHRN, etc.

## 2020-06-24 NOTE — Progress Notes (Signed)
Nutrition Follow-up  DOCUMENTATION CODES:   Obesity unspecified  INTERVENTION:    Ensure Enlive po TID (strawberry), each supplement provides 350 kcal and 20 grams of protein  Magic cup TID with meals, each supplement provides 290 kcal and 9 grams of protein  MVI daily   NUTRITION DIAGNOSIS:   Inadequate oral intake related to inability to eat as evidenced by NPO status.  Ongoing  GOAL:   Patient will meet greater than or equal to 90% of their needs  Progressing   MONITOR:   Skin,Weight trends,Labs,I & O's,Diet advancement  REASON FOR ASSESSMENT:   Consult New TPN/TNA  ASSESSMENT:   60 year old with history of paroxysmal A. fib on anticoagulation, tobacco use, obstructive sleep apnea on CPAP, HTN admitted for abdominal pain. CT abdomen pelvis showed sigmoid diverticulitis with small abscess. Pt COVID positive.   1/29- epigastric pain, vomiting, NPO 1/30- start TPN 2/01- ex lap with L colectomy, end colostomy and small bowel resection 2/02- clear liquid diet 2/06- stop TPN  Diet advanced to soft yesterday. Per patient she is able to consume around 25% of her tray. Struggling with off/on nausea, trying to stay on top of Zofran schedule. RD observed breakfast tray with bites taken. Discussed supplement options to maximize kcal/protein. Patient does not like flavors of Ensure but is willing to have strawberry and also try Magic Cup.   RD encouraged small more frequent meals given nausea.   Admission weight: 111.1 kg  Current weight: 110.8 kg   UOP: 1750 ml x 24 hrs  JP drain (R): 125 ml x 24 hrs  Colostomy: 100 ml x 24 hrs   Medications: colace, fibercon BID Labs: Na 134 (L) CBG 84-115  Diet Order:   Diet Order            DIET SOFT Room service appropriate? Yes; Fluid consistency: Thin  Diet effective 1400           Diet - low sodium heart healthy                 EDUCATION NEEDS:   Not appropriate for education at this time  Skin:  Skin  Assessment: Skin Integrity Issues: Skin Integrity Issues:: Incisions Incisions: abdomen  Last BM:  2/7 via colostomy  Height:   Ht Readings from Last 1 Encounters:  06/18/20 5\' 8"  (1.727 m)    Weight:   Wt Readings from Last 1 Encounters:  06/24/20 110.8 kg    BMI:  Body mass index is 37.14 kg/m.  Estimated Nutritional Needs:   Kcal:  2000-2200  Protein:  100-110 grams  Fluid:  >/= 2 L/day  Mariana Single RD, LDN Clinical Nutrition Pager listed in [place]

## 2020-06-24 NOTE — Consult Note (Signed)
[place] Nurse wound follow up Patient receiving care in [place]. Wound type: abdominal surgical Measurement: 17 cm x 5.2 cm x 3 cm Wound bed: 100% beefy red granulation tissue with scattered adipose, golden in color Drainage (amount, consistency, odor) serosanginous in cannister Periwound: intact Dressing procedure/placement/frequency: 3 pieces of black foam removed, 3 pieces of black foam placed. Drape applied, immediate seal obtained.  [place] Nurse ostomy follow up Stoma type/location: LUQ end colostomy Stomal assessment/size: 1.5 inches round, budded, edematous, pink, moist, all sutures intact Peristomal assessment: intact, no undermining of stool with use of barrier ring Treatment options for stomal/peristomal skin: barrier ring Output: scant amount liquid green in existing pouch Ostomy pouching: 1pc. Flat, Lawson # [phone] and barrier ring, Lawson # [id number].  Additional supplies in room for [facility] and ostomy care. Education provided:  Enrolled patient in [employer] Discharge program: Yes, today.  Patient removed existing pouch, did vast majority of cutting of new opening in new pouch.  Watched and discussed all aspects of pouch change and care.  Patient very interested and motivated to perform her own ostomy care.  We discussed how being at home in front of a mirror with her supplies close will be very helpful in doing so.  Val Riles, RN, MSN, CWOCN, CNS-BC, pager [phone]

## 2020-06-24 NOTE — Progress Notes (Signed)
ANTICOAGULATION CONSULT NOTE - Follow Up Consult  Pharmacy Consult for ARGATROBAN Indication: atrial fibrillation  Labs: Recent Labs    06/21/20 0849 06/22/20 0443 06/22/20 1148 06/22/20 2123 06/23/20 0732 06/23/20 1422 06/23/20 2100 06/24/20 0400  HGB 9.3* 8.5*  --   --   --  8.8*  --   --   HCT 31.7* 28.3*  --   --   --  28.8*  --   --   PLT 97* 71*  --   --   --  68*  --   --   APTT  --   --   --   --   --   --  51* 46*  HEPARINUNFRC  --  0.92*   < > 0.60 0.64 0.55  --   --   CREATININE  --   --   --   --  0.75  --   --   --    < > = values in this interval not displayed.    Assessment: Pt was recently discharged from [facility] for diverticulitis with possible abscess. Pt now has epigastric pain with worsening vomiting. She is on chronic apixaban for afib (last dose on 1/19). Plan is to hold apixaban and bridge with IV heparin. Pharmacy was consulted to restart IV heparin on 06/20/20 as cleared from surgical team.   2/1 S/p Ex-lap, left colectomy, end colostomy, SBR, drain placement.   APTT down to subtherapeutic (46 sec) on argatroban 0.5 mcg/kg/min. No issues with line or bleeding reported per RN.  Goal of Therapy:  APTT 50-80 sec Monitor platelets by anticoagulation protocol: Yes   Plan:  Increase argatroban to 0.6 mcg/kg/min.   Check APTT in 4 hr F/u plt  Thanks for allowing pharmacy to be a part of this patient's care.  Sherlon Handing, PharmD, BCPS Please see amion for complete clinical pharmacist phone list 06/24/2020 5:58 AM

## 2020-06-24 NOTE — Progress Notes (Signed)
PROGRESS NOTE    Tammy Davies  GYJ:[id number] DOB: 08/02/1960 DOA: 06/05/2020 PCP: Patient, No Pcp Per   Brief Narrative:  HPI on 06/06/2020 by Dr. Laurey Arrow [name] is a 60 y.o. female with medical history significant for paroxysmal a fib on noac, smoker, osa not on cpap, htn, history pancreatitis x1, who presents with the above.  Symptoms began a month ago. Began as constipation. Then developed right lower quadrant pain. At first came and went but has worsened. With some associated nausea and decreased appetite. Stools now abnormal, are watery, sometimes with small amounts of red blood. Seen in this ED 1 week ago and diagnosed w/ acute uncomplicated diverticulitis, prescribed augmentin. Took that medicine but LLQ abdominal pain has worsened. Denies fever, no vomiting. No chest pain or cough or sob. No dysuria. No history colonoscopy. No hx of diverticulitis  Interim history Patient admitted with diverticulitis which was unresponsive to conservative treatment and subsequent underwent Hartman's procedure on 06/18/2020.  General surgery continues to follow.  Currently on TPN.  Urology was also consulted as patient underwent cystoscopy with insertion of bilateral ureteral stents on 2/1. Assessment & Plan   Acute complicated diverticulitis with abscess -General surgery consulted and appreciated -Patient failed medical management and underwent laparotomy with left colectomy and end ostomy on 2/1. -Urology was also consulted for periop ureteral stenting which occurred on 2/1, stents were removed on 2/2. -Currently has JP drain as well as wound VAC in place to the midline with new ostomy which wound care is following -Completed course of Zosyn -TPN discontinued -Patient able to tolerate soft diet  Paroxysmal atrial fibrillation/a flutter -Patient appears to be rate controlled although in atrial flutter -Currently on IV Cardizem  -Now the patient has been able to tolerate a soft  diet, will start on oral Cardizem -Continue argatroban (heparin discontinued given thrombocytopenia)  Essential hypertension -Currently stable, on Cardizem  Normocytic anemia -Hemoglobin currently 8.5, appears to be stable -If hemoglobin drops below 7 will transfuse  Thrombocytopenia -patient currently on heparin drip- no signs of bleeding -Question if this is due to HIT -Discussed with pharmacy, HIT antibodies sent -Have placed her on argatroban -Continue to monitor  COVID-19 infection -Incidental finding.  Patient has completed isolation.  DVT Prophylaxis  SCDs  Code Status: Full  Family Communication: None at bedside  Disposition Plan:  Status is: Inpatient  Remains inpatient appropriate because:IV treatments appropriate due to intensity of illness or inability to take PO   Dispo: The patient is from: Home              Anticipated d/c is to: Home with home health services              Anticipated d/c date is: > 3 days              Patient currently is not medically stable to d/c.   Difficult to place patient No   Consultants General surgery Urology  Procedures  2/1>> exploratory laparotomy, left colectomy and colostomy 2/1>> cystoscopy with insertion of bilateral ureteral stents  Antibiotics   Anti-infectives (From admission, onward)   Start     Dose/Rate Route Frequency Ordered Stop   06/18/20 1730  piperacillin-tazobactam (ZOSYN) IVPB 3.375 g  Status:  Discontinued        3.375 g 12.5 mL/hr over 240 Minutes Intravenous Every 8 hours 06/18/20 1644 06/18/20 1644   06/18/20 1730  piperacillin-tazobactam (ZOSYN) IVPB 3.375 g  3.375 g 12.5 mL/hr over 240 Minutes Intravenous Every 8 hours 06/18/20 1644 06/23/20 0936   06/14/20 0000  ciprofloxacin (CIPRO) 500 MG tablet        500 mg Oral 2 times daily 06/14/20 1100 06/24/20 2359   06/14/20 0000  metroNIDAZOLE (FLAGYL) 500 MG tablet        500 mg Oral 3 times daily 06/14/20 1100 06/24/20 2359   06/11/20  1230  ertapenem (INVANZ) 1,000 mg in sodium chloride 0.9 % 100 mL IVPB  Status:  Discontinued        1 g 200 mL/hr over 30 Minutes Intravenous Every 24 hours 06/11/20 1114 06/18/20 1644   06/06/20 1850  piperacillin-tazobactam (ZOSYN) IVPB 3.375 g  Status:  Discontinued        3.375 g 12.5 mL/hr over 240 Minutes Intravenous Every 8 hours 06/06/20 1850 06/11/20 1114   06/06/20 1830  piperacillin-tazobactam (ZOSYN) IVPB 3.375 g        3.375 g 100 mL/hr over 30 Minutes Intravenous  Once 06/06/20 1818 06/06/20 2039      Subjective:   [name] seen and examined today.  Patient states she was able to tolerate her diet well last night.  No nausea or vomiting.  Continues to have some abdominal pain and tenderness.  Denies current chest pain, shortness of breath, dizziness or headache.  Concerned about the size of her wound VAC and if she will be going home with this.    Objective:   Vitals:   06/23/20 2129 06/23/20 2323 06/24/20 0406 06/24/20 0803  BP: 119/68 104/66 (!) 131/113 124/73  Pulse: 85 79 (!) 121 88  Resp: 19 20 20 18   Temp: 97.7 F (36.5 C) 97.9 F (36.6 C) 97.7 F (36.5 C) 99 F (37.2 C)  TempSrc: Oral Oral Oral Oral  SpO2: 100% 97% 96% 97%  Weight:   110.8 kg   Height:        Intake/Output Summary (Last 24 hours) at 06/24/2020 1023 Last data filed at 06/24/2020 0900 Gross per 24 hour  Intake 31.66 ml  Output 3995 ml  Net -3963.34 ml   Filed Weights   06/18/20 0814 06/23/20 0330 06/24/20 0406  Weight: 108 kg 110.8 kg 110.8 kg   Exam  General: Well developed, chronically ill-appearing, NAD  HEENT: NCAT, mucous membranes moist.   Cardiovascular: S1 S2 auscultated, irregular  Respiratory: Clear to auscultation bilaterally with equal chest rise  Abdomen: Soft, obese, TTP, nondistended, + bowel sounds.  Wound VAC to midline, ostomy with liquid stool  Extremities: warm dry without cyanosis clubbing or edema  Neuro: AAOx3, nonfocal  Psych: appropriate  mood and affect, pleasant  Data Reviewed: I have personally reviewed following labs and imaging studies  CBC: Recent Labs  Lab 06/20/20 0400 06/21/20 0849 06/22/20 0443 06/23/20 1422 06/24/20 0500  WBC 10.9* 9.1 7.2 7.5 6.8  HGB 9.4* 9.3* 8.5* 8.8* 8.5*  HCT 29.3* 31.7* 28.3* 28.8* 27.1*  MCV 85.4 88.3 87.9 87.0 85.8  PLT 153 97* 71* 68* 73*   Basic Metabolic Panel: Recent Labs  Lab 06/18/20 0332 06/18/20 1102 06/18/20 1319 06/19/20 0436 06/20/20 0400 06/21/20 0320 06/23/20 0732  NA 135   < > 133* 132* 134* 133* 134*  K 4.1   < > 4.1 4.8 4.3 4.1 4.2  CL 100  --  101 99 96* 102 101  CO2 28  --  23 26 29 23 25   GLUCOSE 101*  --  217* 136* 137* 140* 119*  BUN  7  --  9 14 15 11 14   CREATININE 0.76  --  0.82 0.74 0.88 0.70 0.75  CALCIUM 8.6*  --  8.3* 8.3* 8.8* 8.6* 8.6*  MG 2.0  --   --  2.0 1.9  --   --   PHOS 4.3  --   --  3.7 3.5  --   --    < > = values in this interval not displayed.   GFR: Estimated Creatinine Clearance: 98.9 mL/min (by C-G formula based on SCr of 0.75 mg/dL). Liver Function Tests: Recent Labs  Lab 06/20/20 0400  AST 19  ALT 20  ALKPHOS 88  BILITOT 0.7  PROT 5.7*  ALBUMIN 2.7*   No results for input(s): LIPASE, AMYLASE in the last 168 hours. No results for input(s): AMMONIA in the last 168 hours. Coagulation Profile: No results for input(s): INR, PROTIME in the last 168 hours. Cardiac Enzymes: No results for input(s): CKTOTAL, CKMB, CKMBINDEX, TROPONINI in the last 168 hours. BNP (last 3 results) No results for input(s): PROBNP in the last 8760 hours. HbA1C: No results for input(s): HGBA1C in the last 72 hours. CBG: Recent Labs  Lab 06/23/20 1633 06/23/20 2132 06/23/20 2326 06/24/20 0409 06/24/20 0759  GLUCAP 94 88 90 88 82   Lipid Profile: Recent Labs    06/22/20 0935  TRIG 124   Thyroid Function Tests: No results for input(s): TSH, T4TOTAL, FREET4, T3FREE, THYROIDAB in the last 72 hours. Anemia Panel: No results  for input(s): VITAMINB12, FOLATE, FERRITIN, TIBC, IRON, RETICCTPCT in the last 72 hours. Urine analysis:    Component Value Date/Time   COLORURINE AMBER (A) 05/29/2020 0951   APPEARANCEUR HAZY (A) 05/29/2020 0951   LABSPEC 1.028 05/29/2020 0951   PHURINE 5.0 05/29/2020 0951   GLUCOSEU NEGATIVE 05/29/2020 0951   HGBUR NEGATIVE 05/29/2020 0951   BILIRUBINUR NEGATIVE 05/29/2020 0951   KETONESUR 5 (A) 05/29/2020 0951   PROTEINUR 30 (A) 05/29/2020 0951   UROBILINOGEN 0.2 09/09/2016 1206   NITRITE NEGATIVE 05/29/2020 0951   LEUKOCYTESUR NEGATIVE 05/29/2020 0951   Sepsis Labs: @LABRCNTIP (procalcitonin:4,lacticidven:4)  )No results found for this or any previous visit (from the past 240 hour(s)).    Radiology Studies: No results found.   Scheduled Meds: . acetaminophen  1,000 mg Oral Q6H  . Chlorhexidine Gluconate Cloth  6 each Topical Daily  . docusate sodium  100 mg Oral BID  . lip balm  1 application Topical BID  . methocarbamol  500 mg Oral TID  . naproxen  375 mg Oral BID WC  . polycarbophil  625 mg Oral BID   Continuous Infusions: . argatroban 0.6 mcg/kg/min (06/24/20 2811)  . diltiazem (CARDIZEM) infusion 5 mg/hr (06/24/20 1019)  . methocarbamol (ROBAXIN) IV 1,000 mg (06/20/20 0347)     LOS: 18 days   Time Spent in minutes   45 minutes  Markian Glockner D.O. on 06/24/2020 at 10:23 AM  Between 7am to 7pm - Please see pager noted on [email or web address]  After 7pm go to [email or web address]  And look for the night coverage person covering for me after hours  [facility] Office  [phone]

## 2020-06-25 DIAGNOSIS — N3091 Cystitis, unspecified with hematuria: Secondary | ICD-10-CM | POA: Diagnosis not present

## 2020-06-25 DIAGNOSIS — Z7901 Long term (current) use of anticoagulants: Secondary | ICD-10-CM | POA: Diagnosis not present

## 2020-06-25 DIAGNOSIS — K5720 Diverticulitis of large intestine with perforation and abscess without bleeding: Secondary | ICD-10-CM | POA: Diagnosis not present

## 2020-06-25 DIAGNOSIS — Z933 Colostomy status: Secondary | ICD-10-CM | POA: Diagnosis not present

## 2020-06-25 LAB — CBC
HCT: 28.3 % — ABNORMAL LOW (ref 36.0–46.0)
Hemoglobin: 9.1 g/dL — ABNORMAL LOW (ref 12.0–15.0)
MCH: 27.3 pg (ref 26.0–34.0)
MCHC: 32.2 g/dL (ref 30.0–36.0)
MCV: 85 fL (ref 80.0–100.0)
Platelets: 107 10*3/uL — ABNORMAL LOW (ref 150–400)
RBC: 3.33 MIL/uL — ABNORMAL LOW (ref 3.87–5.11)
RDW: 17.2 % — ABNORMAL HIGH (ref 11.5–15.5)
WBC: 5.9 10*3/uL (ref 4.0–10.5)
nRBC: 0 % (ref 0.0–0.2)

## 2020-06-25 LAB — GLUCOSE, CAPILLARY
Glucose-Capillary: 123 mg/dL — ABNORMAL HIGH (ref 70–99)
Glucose-Capillary: 78 mg/dL (ref 70–99)
Glucose-Capillary: 84 mg/dL (ref 70–99)
Glucose-Capillary: 92 mg/dL (ref 70–99)
Glucose-Capillary: 94 mg/dL (ref 70–99)
Glucose-Capillary: 95 mg/dL (ref 70–99)

## 2020-06-25 LAB — APTT: aPTT: 38 seconds — ABNORMAL HIGH (ref 24–36)

## 2020-06-25 LAB — HEPARIN INDUCED PLATELET AB (HIT ANTIBODY): Heparin Induced Plt Ab: 2.528 OD — ABNORMAL HIGH (ref 0.000–0.400)

## 2020-06-25 MED ORDER — APIXABAN 5 MG PO TABS
5.0000 mg | ORAL_TABLET | Freq: Two times a day (BID) | ORAL | Status: DC
  Administered 2020-06-25 – 2020-06-28 (×7): 5 mg via ORAL
  Filled 2020-06-25 (×7): qty 1

## 2020-06-25 NOTE — Progress Notes (Addendum)
ANTICOAGULATION CONSULT NOTE - Follow Up Consult  Pharmacy Consult for ARGATROBAN Indication: atrial fibrillation  Labs: Recent Labs     0000 06/22/20 2123 06/23/20 0732 06/23/20 1422 06/23/20 2100 06/24/20 0400 06/24/20 0500 06/24/20 1357 06/25/20 0500  HGB   < >  --   --  8.8*  --   --  8.5*  --  9.1*  HCT  --   --   --  28.8*  --   --  27.1*  --  28.3*  PLT  --   --   --  68*  --   --  73*  --  107*  APTT  --   --   --   --    < > 46*  --  55* 38*  HEPARINUNFRC  --  0.60 0.64 0.55  --   --   --   --   --   CREATININE  --   --  0.75  --   --   --   --   --   --    < > = values in this interval not displayed.    Assessment: 60 yo W on chronic apixaban for afib (last dose on 1/19) held for surgery. Patient was bridged with heparin and found to have platelet drop. Pharmacy was consulted for argatroban on 2/6. 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   APTT dropped to SUBtherapeutic at 38 sec on argatroban 0.6 mcg/kg/min. No issues with line or bleeding per RN. H/H stable, plt uptrending. 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 Naproxen BID added, may increase risk for bleeding.  Goal of Therapy:  APTT 50-80 sec Monitor platelets by anticoagulation protocol: Yes   Plan:  Increase argatroban to 0.6 mcg/kg/min.   F/u 4hr aPTT  Monitor daily aPTT, CBC/plt Monitor for signs/symptoms of bleeding  F/u SRA (sent 2/7)  Thanks for allowing pharmacy to be a part of this patient's care.  Benetta Spar, PharmD, BCPS, BCCP Clinical Pharmacist  Please check AMION for all [place] phone numbers After 10:00 PM, call [place] [phone]

## 2020-06-25 NOTE — Progress Notes (Signed)
7 Days Post-Op  Subjective: CC: Doing better this AM. She thinks that she lost her taste from [place] which is why she doesn't like anything or wanting to eat much. She did have some nausea yesterday when drinking the ensure. But tried chick-fila today and was able to eat a sandwich and drink half a large lemonade with nausea. She is still having bowel function.  She reports that the medication adjustments done yesterday helped and has stayed off the IV pain medications this morning. She reports some pain around the colostomy that is tolerable and improved from yesterday.  She worked with [employer] yesterday and colostomy teaching. Patient removed existing pouch, did vast majority of cutting of new opening in new pouch. She emptied her bag this morning.   Objective: Vital signs in last 24 hours: Temp:  [97.8 F (36.6 C)-98.9 F (37.2 C)] 97.8 F (36.6 C) (02/08 0300) Pulse Rate:  [77-102] 77 (02/08 0300) Resp:  [15-20] 20 (02/08 0300) BP: (108-134)/(68-91) 109/68 (02/08 0300) SpO2:  [95 %-98 %] 97 % (02/08 0300) Last BM Date: 06/24/20  Intake/Output from previous day: 02/07 0701 - 02/08 0700 In: -  Out: 2660 [Urine:2500; Drains:160] Intake/Output this shift: No intake/output data recorded.  PE: Gen:  Alert, NAD, pleasant HEENT: EOM's intact, pupils equal and round Card:  Reg Pulm:  Normal rate and effort  Abd: Soft, mild distension, some tenderness on the left upper abdomen near colostomy. No peritonitis. +BS. Colostomy bag with stool and air in bag. Stoma budded and viable. Midline vac in place with SS output in cannister. JP drain with SS fluid in bulb.  Ext:  No LE edema  Psych: A&Ox3  Skin: no rashes noted, warm and dry   Lab Results:  Recent Labs    06/24/20 0500 06/25/20 0500  WBC 6.8 5.9  HGB 8.5* 9.1*  HCT 27.1* 28.3*  PLT 73* 107*   BMET Recent Labs    06/23/20 0732  NA 134*  K 4.2  CL 101  CO2 25  GLUCOSE 119*  BUN 14  CREATININE 0.75  CALCIUM 8.6*    PT/INR No results for input(s): LABPROT, INR in the last 72 hours. CMP     Component Value Date/Time   NA 134 (L) 06/23/2020 0732   NA 141 07/19/2019 0840   K 4.2 06/23/2020 0732   CL 101 06/23/2020 0732   CO2 25 06/23/2020 0732   GLUCOSE 119 (H) 06/23/2020 0732   BUN 14 06/23/2020 0732   BUN 13 07/19/2019 0840   CREATININE 0.75 06/23/2020 0732   CREATININE 0.88 09/09/2016 1146   CALCIUM 8.6 (L) 06/23/2020 0732   PROT 5.7 (L) 06/20/2020 0400   ALBUMIN 2.7 (L) 06/20/2020 0400   AST 19 06/20/2020 0400   ALT 20 06/20/2020 0400   ALKPHOS 88 06/20/2020 0400   BILITOT 0.7 06/20/2020 0400   GFRNONAA >60 06/23/2020 0732   GFRNONAA 74 09/09/2016 1146   GFRAA 105 07/19/2019 0840   GFRAA 86 09/09/2016 1146   Lipase     Component Value Date/Time   LIPASE 19 06/16/2020 0406       Studies/Results: No results found.  Anti-infectives: Anti-infectives (From admission, onward)   Start     Dose/Rate Route Frequency Ordered Stop   06/18/20 1730  piperacillin-tazobactam (ZOSYN) IVPB 3.375 g  Status:  Discontinued        3.375 g 12.5 mL/hr over 240 Minutes Intravenous Every 8 hours 06/18/20 1644 06/18/20 1644   06/18/20 1730  piperacillin-tazobactam (  ZOSYN) IVPB 3.375 g        3.375 g 12.5 mL/hr over 240 Minutes Intravenous Every 8 hours 06/18/20 1644 06/23/20 0936   06/14/20 0000  ciprofloxacin (CIPRO) 500 MG tablet        500 mg Oral 2 times daily 06/14/20 1100 06/24/20 2359   06/14/20 0000  metroNIDAZOLE (FLAGYL) 500 MG tablet        500 mg Oral 3 times daily 06/14/20 1100 06/24/20 2359   06/11/20 1230  ertapenem (INVANZ) 1,000 mg in sodium chloride 0.9 % 100 mL IVPB  Status:  Discontinued        1 g 200 mL/hr over 30 Minutes Intravenous Every 24 hours 06/11/20 1114 06/18/20 1644   06/06/20 1850  piperacillin-tazobactam (ZOSYN) IVPB 3.375 g  Status:  Discontinued        3.375 g 12.5 mL/hr over 240 Minutes Intravenous Every 8 hours 06/06/20 1850 06/11/20 1114   06/06/20 1830   piperacillin-tazobactam (ZOSYN) IVPB 3.375 g        3.375 g 100 mL/hr over 30 Minutes Intravenous  Once 06/06/20 1818 06/06/20 2039       Assessment/Plan Paroxysmal A. Fib on Eliquis at home  OSA HTN COVID +- off precautions  No prior colonoscopy  Thrombocytopenia  Acutesigmoiddiverticulitis  S/p Ex-lap, left colectomy, end colostomy, SBR, drain placement - Dr. Donne Hazel, 06/18/2020 - POD #7 - Pathbenign. - Discuss with MD on removing JP drain. Otherwise will need follow up in the office - Vac to midline. M/W/F - WOCN following for new ostomy. She did well with teaching yesterday and emptied the bag herself this AM. CM working on setting up [facility] for new ostomy and vac - OOB, ambulate/mobilize, PT has seen and recommending no f/u - Pulm toilet - Likely home in the next 24 hours from our standpoint.   FEN - Soft diet VTE - SCDs,argatroban (okay to restart home Eliquis) ID - Zosyn through 2/1-06/23/20 [place] - d/c-ed 2/3, external cath currently  Follow-up - Dr. Donne Hazel.    LOS: 19 days    Jillyn Ledger , [facility] Surgery 06/25/2020, 9:26 AM Please see Amion for pager number during day hours 7:00am-4:30pm

## 2020-06-25 NOTE — Progress Notes (Signed)
Physical Therapy Treatment Patient Details Name: Tammy Davies MRN: [id number] DOB: 1960-06-30 Today's Date: 06/25/2020    History of Present Illness Pt is a 60 y.o. F with significant PMH of paroxysmal A. fib on anticoaguation, tobacco use, HTN, admitted for abdominal pain. CT abdomen pelvis showed sigmoid diverticulitis with small abscess. Patient went for exploratory laparotomy with left colectomy, end colostomy and small bowel resection by Dr. Donne Hazel 06/18/2020.    PT Comments    Pt received in supine and demonstrates good participation, with good progress toward mobility goals noted this session. Pt mainly limited due to elevated HR and decreased activity tolerance from baseline although per pt her Afib is chronic. Pt performed gait trial without AD this session and reports 4/10 modified RPE (fatigue) after gait/seated/standing exercises. Pt continues to benefit from PT services to progress toward functional mobility goals as she is below her functional baseline of being active on her feet full time hours at work.  Follow Up Recommendations  No PT follow up;Supervision for mobility/OOB     Equipment Recommendations  None recommended by PT    Recommendations for Other Services       Precautions / Restrictions Precautions Precautions: Fall;Other (comment) Precaution Comments: colostomy, wound vac, JP drain Restrictions Weight Bearing Restrictions: No    Mobility  Bed Mobility Overal bed mobility: Needs Assistance Bed Mobility: Rolling;Sidelying to Sit Rolling: Modified independent (Device/Increase time)   Supine to sit: Modified independent (Device/Increase time)     General bed mobility comments: cues for log roll returning to bed; pt at times ignoring cues  Transfers Overall transfer level: Needs assistance Equipment used: Rolling walker (2 wheeled) Transfers: Sit to/from Stand Sit to Stand: Supervision         General transfer comment: from EOB, chair,  toilet heights no AD  Ambulation/Gait Ambulation/Gait assistance: Supervision Gait Distance (Feet): 300 Feet (300 +30 ft with seated break) Assistive device: None Gait Pattern/deviations: Step-through pattern;Decreased stride length Gait velocity: decreased; ~0.4 m/s   General Gait Details: cues for activity pacing/slow deep breathing; standing rest breaks when HR elevated >130 bpm (pt chronic Afib), HR max 148 bpm during hallway amb   Stairs             Wheelchair Mobility    Modified Rankin (Stroke Patients Only)       Balance Overall balance assessment: Needs assistance Sitting-balance support: Feet supported Sitting balance-Leahy Scale: Good     Standing balance support: Bilateral upper extremity supported Standing balance-Leahy Scale: Fair Standing balance comment: no AD, no LOB for static/dynamic tasks                            Cognition Arousal/Alertness: Awake/alert Behavior During Therapy: WFL for tasks assessed/performed Overall Cognitive Status: Within Functional Limits for tasks assessed                                 General Comments: motivated, participatory      Exercises Other Exercises Other Exercises: HEP handout ([email or web address]    Access Code: [email or web address]) Other Exercises: BLE AROM: seated hip flexion, LAQ, ankle pumps; standing hamstring curls 1x10-15 reps ea    General Comments General comments (skin integrity, edema, etc.): HR 110-148 bpm during mobility; HR 77-100 bpm resting, RR 14-17 rpm during ADL tasks      Pertinent Vitals/Pain Pain Assessment: Faces Faces Pain Scale: Hurts a little bit Pain  Location: incisional Pain Descriptors / Indicators: Discomfort Pain Intervention(s): Monitored during session;Repositioned    Home Living                      Prior Function            PT Goals (current goals can now be found in the care plan section) Acute Rehab PT Goals Patient Stated  Goal: return home and to work next week on light duty PT Goal Formulation: With patient Time For Goal Achievement: 07/03/20 Potential to Achieve Goals: Good Progress towards PT goals: Progressing toward goals    Frequency    Min 3X/week      PT Plan Current plan remains appropriate    Co-evaluation              AM-PAC PT "6 Clicks" Mobility   Outcome Measure  Help needed turning from your back to your side while in a flat bed without using bedrails?: None Help needed moving from lying on your back to sitting on the side of a flat bed without using bedrails?: None Help needed moving to and from a bed to a chair (including a wheelchair)?: A Little Help needed standing up from a chair using your arms (e.g., wheelchair or bedside chair)?: A Little Help needed to walk in hospital room?: A Little Help needed climbing 3-5 steps with a railing? : A Little 6 Click Score: 20    End of Session   Activity Tolerance: Patient tolerated treatment well Patient left: with call bell/phone within reach;in bed Nurse Communication: Mobility status PT Visit Diagnosis: Pain;Difficulty in walking, not elsewhere classified (R26.2) Pain - part of body:  (abdomen)     Time: 5176-1607 PT Time Calculation (min) (ACUTE ONLY): 33 min  Charges:  $Gait Training: 8-22 mins $Therapeutic Exercise: 8-22 mins                     Camilla Skeen P., PTA Acute Rehabilitation Services Pager: [phone] Office: [place] 06/25/2020, 10:52 AM

## 2020-06-25 NOTE — Progress Notes (Signed)
PROGRESS NOTE    Tammy Davies  VHQ:[id number] DOB: Jul 01, 1960 DOA: 06/05/2020 PCP: Patient, No Pcp Per   Brief Narrative:  HPI on 06/06/2020 by Dr. Laurey Arrow Tammy Davies is a 60 y.o. female with medical history significant for paroxysmal a fib on noac, smoker, osa not on cpap, htn, history pancreatitis x1, who presents with the above.  Symptoms began a month ago. Began as constipation. Then developed right lower quadrant pain. At first came and went but has worsened. With some associated nausea and decreased appetite. Stools now abnormal, are watery, sometimes with small amounts of red blood. Seen in this ED 1 week ago and diagnosed w/ acute uncomplicated diverticulitis, prescribed augmentin. Took that medicine but LLQ abdominal pain has worsened. Denies fever, no vomiting. No chest pain or cough or sob. No dysuria. No history colonoscopy. No hx of diverticulitis  Interim history Patient admitted with diverticulitis which was unresponsive to conservative treatment and subsequent underwent Hartman's procedure on 06/18/2020.  General surgery continues to follow.  Currently on TPN.  Urology was also consulted as patient underwent cystoscopy with insertion of bilateral ureteral stents on 2/1.  Has been transitioned off of TPN and currently tolerating diet.  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  Platelets have improved, over 100 now, will transition to Eliquis. Assessment & Plan   Acute complicated diverticulitis with abscess -General surgery consulted and appreciated -Patient failed medical management and underwent laparotomy with left colectomy and end ostomy on 2/1. -Urology was also consulted for periop ureteral stenting which occurred on 2/1, stents were removed on 2/2. -Currently has JP drain as well as wound VAC in place to the midline with new ostomy which wound care is following-TOC consulted -Completed course of Zosyn -TPN discontinued -Patient able  to tolerate soft diet  Paroxysmal atrial fibrillation/atrial flutter -Patient appears to be rate controlled although in atrial flutter -Transition from IV Cardizem to oral -Now the patient has been able to tolerate a soft diet, will start on oral Cardizem -Was placed on argatroban (heparin discontinued given thrombocytopenia) -We will transition to Eliquis  Essential hypertension -Currently stable, on Cardizem  Normocytic anemia -Hemoglobin currently 9.1, appears to be stable -If hemoglobin drops below 7 will transfuse  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  Patient has completed isolation.  DVT Prophylaxis  SCDs  Code Status: Full  Family Communication: None at bedside  Disposition Plan:  Status is: Inpatient  Remains inpatient appropriate because:IV treatments appropriate due to intensity of illness or inability to take PO   Dispo: The patient is from: Home              Anticipated d/c is to: Home with home health services              Anticipated d/c date is: 1 day              Patient currently is not medically stable to d/c.   Difficult to place patient No   Consultants General surgery Urology  Procedures  2/1>> exploratory laparotomy, left colectomy and colostomy 2/1>> cystoscopy with insertion of bilateral ureteral stents  Antibiotics   Anti-infectives (From admission, onward)   Start     Dose/Rate Route Frequency Ordered Stop   06/18/20 1730  piperacillin-tazobactam (ZOSYN) IVPB  3.375 g  Status:  Discontinued        3.375 g 12.5 mL/hr over 240 Minutes Intravenous Every 8 hours 06/18/20 1644 06/18/20 1644   06/18/20 1730  piperacillin-tazobactam (ZOSYN) IVPB 3.375 g        3.375 g 12.5 mL/hr over 240  Minutes Intravenous Every 8 hours 06/18/20 1644 06/23/20 0936   06/14/20 0000  ciprofloxacin (CIPRO) 500 MG tablet        500 mg Oral 2 times daily 06/14/20 1100 06/24/20 2359   06/14/20 0000  metroNIDAZOLE (FLAGYL) 500 MG tablet        500 mg Oral 3 times daily 06/14/20 1100 06/24/20 2359   06/11/20 1230  ertapenem (INVANZ) 1,000 mg in sodium chloride 0.9 % 100 mL IVPB  Status:  Discontinued        1 g 200 mL/hr over 30 Minutes Intravenous Every 24 hours 06/11/20 1114 06/18/20 1644   06/06/20 1850  piperacillin-tazobactam (ZOSYN) IVPB 3.375 g  Status:  Discontinued        3.375 g 12.5 mL/hr over 240 Minutes Intravenous Every 8 hours 06/06/20 1850 06/11/20 1114   06/06/20 1830  piperacillin-tazobactam (ZOSYN) IVPB 3.375 g        3.375 g 100 mL/hr over 30 Minutes Intravenous  Once 06/06/20 1818 06/06/20 2039      Subjective:   Tammy Davies seen and examined today.  Patient with some nausea overnight feels it may be due to pain medications.  Has been tolerating her diet well without actual vomiting.  Denies current chest pain or shortness of breath, dizziness or headache.  Continues to have some abdominal tenderness at surgical site.  Objective:   Vitals:   06/24/20 2104 06/24/20 2350 06/25/20 0300 06/25/20 0948  BP: 122/72 134/76 109/68 119/68  Pulse: 88 93 77 88  Resp: 19 19 20 14   Temp: 98.6 F (37 C) 98.9 F (37.2 C) 97.8 F (36.6 C) 98.1 F (36.7 C)  TempSrc: Oral Oral Oral Oral  SpO2: 98% 98% 97% 96%  Weight:      Height:        Intake/Output Summary (Last 24 hours) at 06/25/2020 1106 Last data filed at 06/25/2020 0400 Gross per 24 hour  Intake --  Output 590 ml  Net -590 ml   Filed Weights   06/18/20 0814 06/23/20 0330 06/24/20 0406  Weight: 108 kg 110.8 kg 110.8 kg   Exam  General: Well developed, chronically ill-appearing, NAD  HEENT: NCAT, mucous membranes moist.   Cardiovascular: S1 S2 auscultated, irregularly irregular  Respiratory: Diminished breath  sounds however clear  Abdomen: Soft, obese, TTP, nondistended, + bowel sounds.  Wound VAC to midline, ostomy with liquid stool  Extremities: warm dry without cyanosis clubbing or edema  Neuro: AAOx3, nonfocal  Psych: appropriate mood and affect, pleasant  Data Reviewed: I have personally reviewed following labs and imaging studies  CBC: Recent Labs  Lab 06/21/20 0849 06/22/20 0443 06/23/20 1422 06/24/20 0500 06/25/20 0500  WBC 9.1 7.2 7.5 6.8 5.9  HGB 9.3* 8.5* 8.8* 8.5* 9.1*  HCT 31.7* 28.3* 28.8* 27.1* 28.3*  MCV 88.3 87.9 87.0 85.8 85.0  PLT 97* 71* 68* 73* 161*   Basic Metabolic Panel: Recent Labs  Lab 06/18/20 1319 06/19/20 0436 06/20/20 0400 06/21/20 0320 06/23/20 0732  NA 133* 132* 134* 133* 134*  K 4.1 4.8 4.3 4.1 4.2  CL 101 99 96* 102 101  CO2 23 26 29 23 25   GLUCOSE 217* 136* 137* 140* 119*  BUN 9 14 15 11 14   CREATININE 0.82 0.74 0.88 0.70 0.75  CALCIUM 8.3* 8.3* 8.8* 8.6* 8.6*  MG  --  2.0 1.9  --   --   PHOS  --  3.7 3.5  --   --    GFR: Estimated Creatinine Clearance: 98.9 mL/min (by C-G formula based on SCr of 0.75 mg/dL). Liver Function Tests: Recent Labs  Lab 06/20/20 0400  AST 19  ALT 20  ALKPHOS 88  BILITOT 0.7  PROT 5.7*  ALBUMIN 2.7*   No results for input(s): LIPASE, AMYLASE in the last 168 hours. No results for input(s): AMMONIA in the last 168 hours. Coagulation Profile: No results for input(s): INR, PROTIME in the last 168 hours. Cardiac Enzymes: No results for input(s): CKTOTAL, CKMB, CKMBINDEX, TROPONINI in the last 168 hours. BNP (last 3 results) No results for input(s): PROBNP in the last 8760 hours. HbA1C: No results for input(s): HGBA1C in the last 72 hours. CBG: Recent Labs  Lab 06/24/20 1647 06/24/20 2107 06/24/20 2355 06/25/20 0418 06/25/20 0817  GLUCAP 100* 81 115* 84 78   Lipid Profile: No results for input(s): CHOL, HDL, LDLCALC, TRIG, CHOLHDL, LDLDIRECT in the last 72 hours. Thyroid Function  Tests: No results for input(s): TSH, T4TOTAL, FREET4, T3FREE, THYROIDAB in the last 72 hours. Anemia Panel: No results for input(s): VITAMINB12, FOLATE, FERRITIN, TIBC, IRON, RETICCTPCT in the last 72 hours. Urine analysis:    Component Value Date/Time   COLORURINE AMBER (A) 05/29/2020 0951   APPEARANCEUR HAZY (A) 05/29/2020 0951   LABSPEC 1.028 05/29/2020 0951   PHURINE 5.0 05/29/2020 0951   GLUCOSEU NEGATIVE 05/29/2020 0951   HGBUR NEGATIVE 05/29/2020 0951   BILIRUBINUR NEGATIVE 05/29/2020 0951   KETONESUR 5 (A) 05/29/2020 0951   PROTEINUR 30 (A) 05/29/2020 0951   UROBILINOGEN 0.2 09/09/2016 1206   NITRITE NEGATIVE 05/29/2020 0951   LEUKOCYTESUR NEGATIVE 05/29/2020 0951   Sepsis Labs: @LABRCNTIP (procalcitonin:4,lacticidven:4)  )No results found for this or any previous visit (from the past 240 hour(s)).    Radiology Studies: No results found.   Scheduled Meds: . acetaminophen  1,000 mg Oral Q6H  . Chlorhexidine Gluconate Cloth  6 each Topical Daily  . diltiazem  30 mg Oral Q6H  . docusate sodium  100 mg Oral BID  . feeding supplement  237 mL Oral TID BM  . lip balm  1 application Topical BID  . methocarbamol  500 mg Oral TID  . multivitamin with minerals  1 tablet Oral Daily  . naproxen  375 mg Oral BID WC  . polycarbophil  625 mg Oral BID   Continuous Infusions: . argatroban 0.8 mcg/kg/min (06/25/20 0945)  . methocarbamol (ROBAXIN) IV 1,000 mg (06/20/20 0347)     LOS: 19 days   Time Spent in minutes   45 minutes  Kierston Plasencia D.O. on 06/25/2020 at 11:06 AM  Between 7am to 7pm - Please see pager noted on [email or web address]  After 7pm go to [email or web address]  And look for the night coverage person covering for me after hours  [facility] Office  [phone]

## 2020-06-25 NOTE — Progress Notes (Signed)
ANTICOAGULATION CONSULT NOTE - Follow Up Consult  Pharmacy Consult for argatroban >> apixaban Indication: atrial fibrillation and HIT  Labs: Recent Labs     0000 06/22/20 2123 06/23/20 0732 06/23/20 1422 06/23/20 2100 06/24/20 0400 06/24/20 0500 06/24/20 1357 06/25/20 0500  HGB   < >  --   --  8.8*  --   --  8.5*  --  9.1*  HCT  --   --   --  28.8*  --   --  27.1*  --  28.3*  PLT  --   --   --  68*  --   --  73*  --  107*  APTT  --   --   --   --    < > 46*  --  55* 38*  HEPARINUNFRC  --  0.60 0.64 0.55  --   --   --   --   --   CREATININE  --   --  0.75  --   --   --   --   --   --    < > = values in this interval not displayed.    Assessment: 60 yo W on chronic apixaban for afib (last dose on 1/19) held for surgery. Patient was bridged with heparin and found to have platelet drop. Pharmacy was consulted for argatroban on 2/6. 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 Pharmacy now consulted to switch back to home apixaban given platelet recovery.   Naproxen BID may increase risk for bleeding, ok to stop per MD. Good renal function.  Goal of Therapy:  Monitor platelets by anticoagulation protocol: Yes   Plan:  Stop argatroban Restart PTA apixaban 78m BID Monitor for signs/symptoms of bleeding  F/u SRA (sent 2/7) and platelets   Thanks for allowing pharmacy to be a part of this patient's care.  LBenetta Spar PharmD, BCPS, BCCP Clinical Pharmacist  Please check AMION for all [place] numbers After 10:00 PM, call [phone]

## 2020-06-25 NOTE — TOC Progression Note (Addendum)
Transition of Care (TOC) - Progression Note  Marvetta Gibbons RN, BSN Transitions of Care Unit 4E- RN Case Manager See Treatment Team for direct phone #    Patient Details  Name: Tammy Davies MRN: [id number] Date of Birth: Nov 25, 1960  Transition of Care [facility]) CM/SW Contact  Dahlia Client, Romeo Rabon, RN Phone Number: 06/25/2020, 12:24 PM  Clinical Narrative:    Referral received for Home wound VAC needs and HHRN, follow up done today with pt at bedside- pt is showing no insurance- went to room to discuss with pt charity home wound VAC  And [facility] RN arrangements. [employer] home wound VAC forms have been signed this AM.  Per conversation with pt she states she does have insurance, and is able to pull up insurance card through portal- was able to email card and print it out on unit. Copy of card faxed to admitting to have info placed into epic, also placed copy into shadow chart.  Since pt has insurance will not need charity wound VAC- will need insurance approval for home wound VAC- explained this process to pt, also provided pt choice for [facility] agency Per CMS guidelines from medicare.gov website with star ratings (copy placed in shadow chart), per pt she states the does not have a preference and defers to this writer to secure an agency on her behalf for her needs.  Went over DME needs- pt reports no DME needs at this time other than the wound VAC.  Pt reports she is establishing PCP at [facility] on [address]- she has missed appointment while here in hospital and will re-schedule when she gets home, she also has cardiologist with [place] will try and schedule f/u appointment per pt request.   Call made to [facility] with [facility] for home wound VAC needs- faxed home wound VAC order form along with copy of insurance card to [place] to begin process of insurance approval for home wound VAC. If approved will have home VAC delivered here once released.   CM will work on finding a [place] for [facility]  needs that can provide the needed services.  Call made to  [facility]- unable to accept due to staffing [facility]- pending  Expected Discharge Plan: [place] Barriers to Discharge: Continued Medical Work up  Expected Discharge Plan and Services Expected Discharge Plan: [place]   Discharge Planning Services: CM Consult Post Acute Care Choice: [place] arrangements for the past 2 months: Apartment                 DME Arranged: Vac DME Agency: [employer] Date DME Agency Contacted: 06/25/20 Time DME Agency Contacted: 1223 Representative spoke with at DME Agency: Olivia Mackie [facility] Arranged: RN           Social Determinants of Health ([place]) Interventions    Readmission Risk Interventions No flowsheet data found.

## 2020-06-26 ENCOUNTER — Other Ambulatory Visit (HOSPITAL_COMMUNITY): Payer: Self-pay | Admitting: Physician Assistant

## 2020-06-26 DIAGNOSIS — Z7901 Long term (current) use of anticoagulants: Secondary | ICD-10-CM | POA: Diagnosis not present

## 2020-06-26 DIAGNOSIS — Z933 Colostomy status: Secondary | ICD-10-CM | POA: Diagnosis not present

## 2020-06-26 DIAGNOSIS — K5720 Diverticulitis of large intestine with perforation and abscess without bleeding: Secondary | ICD-10-CM | POA: Diagnosis not present

## 2020-06-26 LAB — CBC
HCT: 30.7 % — ABNORMAL LOW (ref 36.0–46.0)
Hemoglobin: 9.3 g/dL — ABNORMAL LOW (ref 12.0–15.0)
MCH: 26.3 pg (ref 26.0–34.0)
MCHC: 30.3 g/dL (ref 30.0–36.0)
MCV: 86.7 fL (ref 80.0–100.0)
Platelets: 127 10*3/uL — ABNORMAL LOW (ref 150–400)
RBC: 3.54 MIL/uL — ABNORMAL LOW (ref 3.87–5.11)
RDW: 17.4 % — ABNORMAL HIGH (ref 11.5–15.5)
WBC: 5.3 10*3/uL (ref 4.0–10.5)
nRBC: 0 % (ref 0.0–0.2)

## 2020-06-26 LAB — GLUCOSE, CAPILLARY
Glucose-Capillary: 89 mg/dL (ref 70–99)
Glucose-Capillary: 123 mg/dL — ABNORMAL HIGH (ref 70–99)
Glucose-Capillary: 89 mg/dL (ref 70–99)
Glucose-Capillary: 100 mg/dL — ABNORMAL HIGH (ref 70–99)

## 2020-06-26 LAB — MAGNESIUM: Magnesium: 1.8 mg/dL (ref 1.7–2.4)

## 2020-06-26 MED ORDER — OXYCODONE HCL 5 MG PO TABS: 5.0000 mg | ORAL_TABLET | Freq: Four times a day (QID) | ORAL | 0 refills | Status: DC | PRN

## 2020-06-26 MED FILL — oxyCODONE HCL 5 MG TABS: 5 | 5 days supply | Qty: 20 | Fill #0

## 2020-06-26 NOTE — TOC Progression Note (Addendum)
Transition of Care (TOC) - Progression Note  Marvetta Gibbons RN, BSN Transitions of Care Unit 4E- RN Case Manager See Treatment Team for direct phone #    Patient Details  Name: Tammy Davies MRN: [id number] Date of Birth: February 25, 1961  Transition of Care [facility]) CM/SW Contact  Dahlia Client, Romeo Rabon, RN Phone Number: 06/26/2020, 2:53 PM  Clinical Narrative:    Noted pt medically stable for transition home- barrier [employer] home wound VAC approval and [facility] agency not secured for services.  Received a call back from [facility]- they are unable to accept due to staffing Call made to [facility]- unable to accept- [facility]- unable to verify insurance- unable to accept due to low RN staffing availability.   [facility]- unable to accept- do not accept new ostomies  [facility]- unable to accept- [facility] - unable to accept- no Armed forces logistics/support/administrative officer avail for pt's area [place]- unable to accept- no nursing availability [facility]- unable to accept- no nursing availability for needs [facility]- no availability  [facility]- pending- have not received return call after 2 attempts [facility] - unable to accept- no RN availability   Have also received call from [place] with [employer] that pt is OON with them for home wound VAC - per Olivia Mackie could see if pt can get OON benefit approval with further documentation (would need LMN and DNS from MD to submit)- called and spoke with Barth Kirks with surgery regarding barriers at this time- per Legrand Como will hold for now on wound VAC and see if [facility] can be secured. Will continue to reach out to [facility] agencies for [facility] needs. Will update Legrand Como on situation once all attempts have been made.  Olivia Mackie with [employer] on stand by to see if [facility] can be secured before pursuing home VAC further.   TOC continues to work on [facility] and have updated patient at the bedside. Attending MD also aware of barriers to transition home.     Expected Discharge Plan: [place] Barriers to  Discharge: Continued Medical Work up  Expected Discharge Plan and Services Expected Discharge Plan: [place]   Discharge Planning Services: CM Consult Post Acute Care Choice: [place] arrangements for the past 2 months: Apartment                 DME Arranged: Vac DME Agency: [employer] Date DME Agency Contacted: 06/25/20 Time DME Agency Contacted: 1223 Representative spoke with at DME Agency: Olivia Mackie HH Arranged: RN           Social Determinants of Health ([place]) Interventions    Readmission Risk Interventions No flowsheet data found.

## 2020-06-26 NOTE — Progress Notes (Signed)
Physical Therapy Treatment Patient Details Name: Tammy Davies MRN: [id number] DOB: [birth year] Today's Date: 06/26/2020    History of Present Illness Pt is a 60 y.o. F with significant PMH of paroxysmal A. fib on anticoaguation, tobacco use, HTN, admitted for abdominal pain. CT abdomen pelvis showed sigmoid diverticulitis with small abscess. Patient went for exploratory laparotomy with left colectomy, end colostomy and small bowel resection by Dr. Donne Hazel 06/18/2020.    PT Comments    Pt received in supine, agreeable to therapy session and with good participation and tolerance for mobility. Pt performed greater than household distance ambulation task without LOB and no assistive device used as well as 12 steps with single rail and Supervision per home setup. Pt reports 5/10 modified RPE (fatigue) at end of session and remains below functional mobility activity tolerance at baseline she is on her feet mostly 8 hours of the day. Plan to assess DGI next session for higher level balance challenge. Pt continues to benefit from PT services to progress toward functional mobility goals. Anticipate pt safe to DC home once medically cleared.  Follow Up Recommendations  No PT follow up;Supervision for mobility/OOB     Equipment Recommendations  None recommended by PT    Recommendations for Other Services       Precautions / Restrictions Precautions Precautions: Fall;Other (comment) Precaution Comments: colostomy, wound vac, JP drain Restrictions Weight Bearing Restrictions: No    Mobility  Bed Mobility Overal bed mobility: Needs Assistance Bed Mobility: Rolling;Sidelying to Sit Rolling: Modified independent (Device/Increase time)   Supine to sit: Modified independent (Device/Increase time)     General bed mobility comments: pt ignores cues for log rolling  Transfers Overall transfer level: Needs assistance Equipment used: Rolling walker (2 wheeled) Transfers: Sit to/from  Stand Sit to Stand: Modified independent (Device/Increase time)         General transfer comment: from EOB, no AD  Ambulation/Gait Ambulation/Gait assistance: Supervision Gait Distance (Feet): 500 Feet Assistive device: None Gait Pattern/deviations: Step-through pattern;Decreased stride length Gait velocity: grossly 0.6 m/s   General Gait Details: cues for activity pacing/slow deep breathing; standing rest breaks when HR elevated >130 bpm (pt chronic Afib), HR max 155 bpm during hallway amb/stairs   Stairs Stairs: Yes Stairs assistance: Supervision Stair Management: One rail Left;Step to pattern;One rail Right;Alternating pattern;Forwards Number of Stairs: 12 (4 steps x3 reps) General stair comments: no LOB, Supervision for safety. Utilized L rail ascending and R descending to simulate home set-up. Ascends with reciprocal gait pattern and descends with step-to gait pattern.   Wheelchair Mobility    Modified Rankin (Stroke Patients Only)       Balance Overall balance assessment: Needs assistance Sitting-balance support: Feet supported Sitting balance-Leahy Scale: Good     Standing balance support: Bilateral upper extremity supported Standing balance-Leahy Scale: Fair Standing balance comment: no AD, no LOB for static/dynamic tasks, pt deferring to attempt tandem stance this date, try DGI next session; pt at times reaching for hallway handrail but able to walk without AD when encouraged not to use railing                            Cognition Arousal/Alertness: Awake/alert Behavior During Therapy: [facility] for tasks assessed/performed Overall Cognitive Status: Within Functional Limits for tasks assessed  General Comments: motivated, participatory      Exercises      General Comments General comments (skin integrity, edema, etc.): HR 120-155 bpm during mobility tasks/after return to supine (chronic Afib); pt  reports 5/10 RPE after mobility      Pertinent Vitals/Pain Pain Assessment: Faces Faces Pain Scale: Hurts a little bit Pain Location: incisional Pain Descriptors / Indicators: Discomfort Pain Intervention(s): Monitored during session    Home Living                      Prior Function            PT Goals (current goals can now be found in the care plan section) Acute Rehab PT Goals Patient Stated Goal: return home and to work next week on light duty PT Goal Formulation: With patient Time For Goal Achievement: 07/03/20 Potential to Achieve Goals: Good Progress towards PT goals: Progressing toward goals    Frequency    Min 3X/week      PT Plan Current plan remains appropriate    Co-evaluation              AM-PAC PT "6 Clicks" Mobility   Outcome Measure  Help needed turning from your back to your side while in a flat bed without using bedrails?: None Help needed moving from lying on your back to sitting on the side of a flat bed without using bedrails?: None Help needed moving to and from a bed to a chair (including a wheelchair)?: None Help needed standing up from a chair using your arms (e.g., wheelchair or bedside chair)?: None Help needed to walk in hospital room?: A Little Help needed climbing 3-5 steps with a railing? : A Little 6 Click Score: 22    End of Session   Activity Tolerance: Patient tolerated treatment well Patient left: with call bell/phone within reach;in bed Nurse Communication: Mobility status PT Visit Diagnosis: Pain;Difficulty in walking, not elsewhere classified (R26.2) Pain - part of body:  (abdomen)     Time: 0370-4888 PT Time Calculation (min) (ACUTE ONLY): 12 min  Charges:  $Gait Training: 8-22 mins                     Dianara Smullen P., PTA Acute Rehabilitation Services Pager: [phone] Office: [place] 06/26/2020, 5:32 PM

## 2020-06-26 NOTE — Progress Notes (Signed)
Right arm DL PICC removed per protocol per MD order. Manual pressure applied for 5 mins. No bleeding or swelling noted. Instructed patient to remain in bed for thirty mins. Educated patient about S/S of infection and when to call MD; no heavy lifting or pressure on right side for 24 hours; keep dressing dry and intact for 24 hours. Pt verbalized comprehension.

## 2020-06-26 NOTE — Progress Notes (Signed)
Wound vac output 7a-7p today      50 cc serosanguinous

## 2020-06-26 NOTE — Progress Notes (Signed)
JP drain to right abdomen removed, patient tolerated procedure well. No bleeding or drainage noted to site. Site cleansed and gauze dressing applied. Redness present to peri insertion site.

## 2020-06-26 NOTE — Progress Notes (Addendum)
8 Days Post-Op  Subjective: CC: Doing well today. Able to eat more and reports she is tolerating without emesis. Nausea improved. Denies nausea this am. Colostomy functioning. Mobilizing. Wound vac already changed this am. She plans to stay at home with her 2 sons who will help her at discharge.   Objective: Vital signs in last 24 hours: Temp:  [97.8 F (36.6 C)-99.1 F (37.3 C)] 99.1 F (37.3 C) (02/09 1021) Pulse Rate:  [81-106] 88 (02/09 1021) Resp:  [15-20] 16 (02/09 1021) BP: (109-138)/(54-84) 138/67 (02/09 1021) SpO2:  [93 %-99 %] 98 % (02/09 0349) Last BM Date: 06/26/20  Intake/Output from previous day: 02/08 0701 - 02/09 0700 In: -  Out: 92 [Drains:90; Stool:2] Intake/Output this shift: No intake/output data recorded.  PE: Gen:  Alert, NAD, pleasant HEENT: EOM's intact, pupils equal and round Card:  Reg Pulm:  Normal rate and effort  Abd: Soft, mild distension, very mild tenderness on the left upper abdomen near colostomy. No peritonitis. +BS. Colostomy bag with stool and air in bag. Stoma budded and viable. Midline vac in place with SS output in cannister. JP drain with SS fluid in bulb. Vac already changed by WOCN and per their report it is 100% beefy red, clean Ext:  No LE edema  Psych: A&Ox3  Skin: no rashes noted, warm and dry  Lab Results:  Recent Labs    06/25/20 0500 06/26/20 0412  WBC 5.9 5.3  HGB 9.1* 9.3*  HCT 28.3* 30.7*  PLT 107* 127*   BMET No results for input(s): NA, K, CL, CO2, GLUCOSE, BUN, CREATININE, CALCIUM in the last 72 hours. PT/INR No results for input(s): LABPROT, INR in the last 72 hours. CMP     Component Value Date/Time   NA 134 (L) 06/23/2020 0732   NA 141 07/19/2019 0840   K 4.2 06/23/2020 0732   CL 101 06/23/2020 0732   CO2 25 06/23/2020 0732   GLUCOSE 119 (H) 06/23/2020 0732   BUN 14 06/23/2020 0732   BUN 13 07/19/2019 0840   CREATININE 0.75 06/23/2020 0732   CREATININE 0.88 09/09/2016 1146   CALCIUM 8.6 (L)  06/23/2020 0732   PROT 5.7 (L) 06/20/2020 0400   ALBUMIN 2.7 (L) 06/20/2020 0400   AST 19 06/20/2020 0400   ALT 20 06/20/2020 0400   ALKPHOS 88 06/20/2020 0400   BILITOT 0.7 06/20/2020 0400   GFRNONAA >60 06/23/2020 0732   GFRNONAA 74 09/09/2016 1146   GFRAA 105 07/19/2019 0840   GFRAA 86 09/09/2016 1146   Lipase     Component Value Date/Time   LIPASE 19 06/16/2020 0406       Studies/Results: No results found.  Anti-infectives: Anti-infectives (From admission, onward)   Start     Dose/Rate Route Frequency Ordered Stop   06/18/20 1730  piperacillin-tazobactam (ZOSYN) IVPB 3.375 g  Status:  Discontinued        3.375 g 12.5 mL/hr over 240 Minutes Intravenous Every 8 hours 06/18/20 1644 06/18/20 1644   06/18/20 1730  piperacillin-tazobactam (ZOSYN) IVPB 3.375 g        3.375 g 12.5 mL/hr over 240 Minutes Intravenous Every 8 hours 06/18/20 1644 06/23/20 0936   06/14/20 0000  ciprofloxacin (CIPRO) 500 MG tablet        500 mg Oral 2 times daily 06/14/20 1100 06/24/20 2359   06/14/20 0000  metroNIDAZOLE (FLAGYL) 500 MG tablet        500 mg Oral 3 times daily 06/14/20 1100 06/24/20 2359  06/11/20 1230  ertapenem (INVANZ) 1,000 mg in sodium chloride 0.9 % 100 mL IVPB  Status:  Discontinued        1 g 200 mL/hr over 30 Minutes Intravenous Every 24 hours 06/11/20 1114 06/18/20 1644   06/06/20 1850  piperacillin-tazobactam (ZOSYN) IVPB 3.375 g  Status:  Discontinued        3.375 g 12.5 mL/hr over 240 Minutes Intravenous Every 8 hours 06/06/20 1850 06/11/20 1114   06/06/20 1830  piperacillin-tazobactam (ZOSYN) IVPB 3.375 g        3.375 g 100 mL/hr over 30 Minutes Intravenous  Once 06/06/20 1818 06/06/20 2039       Assessment/Plan Paroxysmal A. Fib on Eliquis at home  OSA HTN COVID +- off precautions  No prior colonoscopy Thrombocytopenia  Acutesigmoiddiverticulitis  S/p Ex-lap, left colectomy, end colostomy, SBR, drain placement - Dr. Donne Hazel, 06/18/2020 - POD #8 -  Pathbenign. - Vac to midline. M/W/F - WOC[facility] following for new ostomy. She did well with teaching this week. CM working on setting up [facility] for new ostomy and vac - OOB, ambulate/mobilize, PT has seen and recommending no f/u - Pulm toilet - Okay for d/c from our standpoint. I have written for drain to be removed prior to d/c. I have sent her pain meds to the pharmacy. I reached out to [facility] to let them know.  FEN -Soft diet VTE - SCDs, Eliquis (hgb stable at 9.3) ID - Zosyn through2/1-06/23/20 F[place] - d/c-ed 2/3 Follow-up - Dr. Donne Hazel.     LOS: 20 days    Tammy Davies , [facility] 06/26/2020, 10:57 AM Please see Amion for pager number during day hours 7:00am-4:30pm

## 2020-06-26 NOTE — Progress Notes (Signed)
PROGRESS NOTE    Tammy Davies  ONG:[id number] DOB: [birth year] DOA: 06/05/2020 PCP: Patient, No Pcp Per    Brief Narrative:  60 y.o.femalewith medical history significant forparoxysmal a fib on noac, smoker, osa not on cpap, htn, history pancreatitis x1, who presents with the above.  Symptoms began a month ago. Began as constipation. Then developed right lower quadrant pain. At first came and went but has worsened. With some associated nausea and decreased appetite. Stools now abnormal, are watery, sometimes with small amounts of red blood. Seen in this ED 1 week ago and diagnosed w/ acute uncomplicated diverticulitis, prescribed augmentin. Took that medicine but LLQ abdominal pain has worsened. Denies fever, no vomiting. No chest pain or cough or sob. No dysuria. No history colonoscopy. 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 -Urology was also consulted for periop ureteral stenting which occurred on 2/1, stents were removed on 2/2. -Currently has JP drain as well as wound VAC in place to the midline with new ostomy which wound care is following-TOC consulted -Completed course of Zosyn -TPN discontinued -Patient able to tolerate soft diet -Discussed with Surgery, OK to d/c from surgery standpoint. 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  Patient has completed isolation.  DVT Prophylaxis  eliquis  DVT prophylaxis: eliquis Code Status: Full Family Communication: Pt in room, family not at bedside  Status is: Inpatient  Remains inpatient appropriate because:Unsafe d/c plan   Dispo: The patient is from: Home              Anticipated d/c is to: Home              Anticipated d/c date is: 1 day              Patient currently is medically stable to d/c.Just arranging services per [facility]   Difficult to place patient No       Consultants:   General Surgery  Procedures:  2/1>> exploratory laparotomy, left colectomy and colostomy 2/1>> cystoscopy with insertion of bilateral ureteral stents  Antimicrobials: Anti-infectives (From admission, onward)   Start     Dose/Rate Route Frequency Ordered Stop   06/18/20 1730  piperacillin-tazobactam (ZOSYN) IVPB 3.375 g  Status:  Discontinued  3.375 g 12.5 mL/hr over 240 Minutes Intravenous Every 8 hours 06/18/20 1644 06/18/20 1644   06/18/20 1730  piperacillin-tazobactam (ZOSYN) IVPB 3.375 g        3.375 g 12.5 mL/hr over 240 Minutes Intravenous Every 8 hours 06/18/20 1644 06/23/20 0936    06/14/20 0000  ciprofloxacin (CIPRO) 500 MG tablet        500 mg Oral 2 times daily 06/14/20 1100 06/24/20 2359   06/14/20 0000  metroNIDAZOLE (FLAGYL) 500 MG tablet        500 mg Oral 3 times daily 06/14/20 1100 06/24/20 2359   06/11/20 1230  ertapenem (INVANZ) 1,000 mg in sodium chloride 0.9 % 100 mL IVPB  Status:  Discontinued        1 g 200 mL/hr over 30 Minutes Intravenous Every 24 hours 06/11/20 1114 06/18/20 1644   06/06/20 1850  piperacillin-tazobactam (ZOSYN) IVPB 3.375 g  Status:  Discontinued        3.375 g 12.5 mL/hr over 240 Minutes Intravenous Every 8 hours 06/06/20 1850 06/11/20 1114   06/06/20 1830  piperacillin-tazobactam (ZOSYN) IVPB 3.375 g        3.375 g 100 mL/hr over 30 Minutes Intravenous  Once 06/06/20 1818 06/06/20 2039       Subjective: Eager to go home  Objective: Vitals:   06/25/20 2346 06/26/20 0349 06/26/20 1021 06/26/20 1209  BP: 118/79 (!) 116/54 138/67 128/69  Pulse: (!) 104 (!) 106 88 (!) 106  Resp: 16 20 16 18   Temp: 97.8 F (36.6 C) 98 F (36.7 C) 99.1 F (37.3 C) 99 F (37.2 C)  TempSrc: Oral Oral Oral Oral  SpO2: 99% 98%  96%  Weight:      Height:        Intake/Output Summary (Last 24 hours) at 06/26/2020 1813 Last data filed at 06/26/2020 1300 Gross per 24 hour  Intake --  Output 55 ml  Net -55 ml   Filed Weights   06/18/20 0814 06/23/20 0330 06/24/20 0406  Weight: 108 kg 110.8 kg 110.8 kg    Examination:  General exam: Appears calm and comfortable  Respiratory system: Clear to auscultation. Respiratory effort normal. Cardiovascular system: S1 & S2 heard, Regular Gastrointestinal system: Abdomen is nondistended, ostomy bag in place Central nervous system: Alert and oriented. No focal neurological deficits. Extremities: Symmetric 5 x 5 power. Skin: No rashes, lesions Psychiatry: Judgement and insight appear normal. Mood & affect appropriate.   Data Reviewed: I have personally reviewed following labs and imaging  studies  CBC: Recent Labs  Lab 06/22/20 0443 06/23/20 1422 06/24/20 0500 06/25/20 0500 06/26/20 0412  WBC 7.2 7.5 6.8 5.9 5.3  HGB 8.5* 8.8* 8.5* 9.1* 9.3*  HCT 28.3* 28.8* 27.1* 28.3* 30.7*  MCV 87.9 87.0 85.8 85.0 86.7  PLT 71* 68* 73* 107* 151*   Basic Metabolic Panel: Recent Labs  Lab 06/20/20 0400 06/21/20 0320 06/23/20 0732 06/26/20 1016  NA 134* 133* 134*  --   K 4.3 4.1 4.2  --   CL 96* 102 101  --   CO2 29 23 25   --   GLUCOSE 137* 140* 119*  --   BUN 15 11 14   --   CREATININE 0.88 0.70 0.75  --   CALCIUM 8.8* 8.6* 8.6*  --   MG 1.9  --   --  1.8  PHOS 3.5  --   --   --    GFR: Estimated Creatinine Clearance: 98.9 mL/min (by C-G formula based on SCr  of 0.75 mg/dL). Liver Function Tests: Recent Labs  Lab 06/20/20 0400  AST 19  ALT 20  ALKPHOS 88  BILITOT 0.7  PROT 5.7*  ALBUMIN 2.7*   No results for input(s): LIPASE, AMYLASE in the last 168 hours. No results for input(s): AMMONIA in the last 168 hours. Coagulation Profile: No results for input(s): INR, PROTIME in the last 168 hours. Cardiac Enzymes: No results for input(s): CKTOTAL, CKMB, CKMBINDEX, TROPONINI in the last 168 hours. BNP (last 3 results) No results for input(s): PROBNP in the last 8760 hours. HbA1C: No results for input(s): HGBA1C in the last 72 hours. CBG: Recent Labs  Lab 06/25/20 2344 06/26/20 0538 06/26/20 0830 06/26/20 1204 06/26/20 1629  GLUCAP 92 89 123* 89 100*   Lipid Profile: No results for input(s): CHOL, HDL, LDLCALC, TRIG, CHOLHDL, LDLDIRECT in the last 72 hours. Thyroid Function Tests: No results for input(s): TSH, T4TOTAL, FREET4, T3FREE, THYROIDAB in the last 72 hours. Anemia Panel: No results for input(s): VITAMINB12, FOLATE, FERRITIN, TIBC, IRON, RETICCTPCT in the last 72 hours. Sepsis Labs: No results for input(s): PROCALCITON, LATICACIDVEN in the last 168 hours.  No results found for this or any previous visit (from the past 240 hour(s)).    Radiology Studies: No results found.  Scheduled Meds: . acetaminophen  1,000 mg Oral Q6H  . apixaban  5 mg Oral BID  . Chlorhexidine Gluconate Cloth  6 each Topical Daily  . diltiazem  30 mg Oral Q6H  . docusate sodium  100 mg Oral BID  . feeding supplement  237 mL Oral TID BM  . lip balm  1 application Topical BID  . methocarbamol  500 mg Oral TID  . multivitamin with minerals  1 tablet Oral Daily  . polycarbophil  625 mg Oral BID   Continuous Infusions: . methocarbamol (ROBAXIN) IV 1,000 mg (06/20/20 0347)     LOS: 20 days   Marylu Lund, MD [facility] Pager On Amion  If 7PM-7AM, please contact night-coverage 06/26/2020, 6:13 PM

## 2020-06-26 NOTE — TOC Benefit Eligibility Note (Signed)
Transition of Care [facility]) Benefit Eligibility Note    Patient Details  Name: Tammy Davies MRN: [id number] Date of Birth: Mar 30, 1961   Medication/Dose: Arne Cleveland  5 MG BID  Covered?: Yes  Tier: 2 Drug  Prescription Coverage Preferred Pharmacy: [facility] with Person/Company/Phone Number:: KRISTY  @  [place] # [phone]  Co-Pay: $90.00  Prior Approval: No  Deductible: Met Duayne Cal Phone Number: 06/26/2020, 12:00 PM

## 2020-06-26 NOTE — Consult Note (Signed)
[place] Nurse wound follow up Patient receiving care in Tammy Davies.  Daughter, [name], at bedside for ostomy teaching. Wound type: abdominal surgical incision Measurement: deferred Wound bed: 100% beefy red, clean Drainage (amount, consistency, odor) serosanginous in cannister Periwound: intact Dressing procedure/placement/frequency: All black foam pieces removed from wound bed. Wound filled with 2 pieces of black foam, drape applied, immediate seal obtained.  [place] Nurse ostomy follow up Stoma type/location: LUQ colostomy Stomal assessment/size: 1 1/4 inch Peristomal assessment: the very early stages of fungal rash to peristomal area.  This was crusted with antifungal powder and skin prep pads Treatment options for stomal/peristomal skin: barrier ring. Next new pouch cut for patient at new size of 1 1/4 inches offset to accommodate VAD dressing. Output: thin brown Ostomy pouching: 1pc. Education provided:  Enrolled patient in [place] Start Discharge program: Yes  Margo removed existing pouch, cut new pouch, applied barrier ring, closed pouch, applied tape border. I have requested the [place] to order additional pouches and barrier rings.  Patient states she may go home today.\ Val Riles, RN, MSN, CWOCN, CNS-BC, pager [phone]

## 2020-06-27 ENCOUNTER — Telehealth: Payer: Self-pay | Admitting: Interventional Cardiology

## 2020-06-27 LAB — COMPREHENSIVE METABOLIC PANEL
ALT: 35 U/L (ref 0–44)
AST: 29 U/L (ref 15–41)
Albumin: 3 g/dL — ABNORMAL LOW (ref 3.5–5.0)
Alkaline Phosphatase: 109 U/L (ref 38–126)
Anion gap: 9 (ref 5–15)
BUN: 10 mg/dL (ref 6–20)
CO2: 26 mmol/L (ref 22–32)
Calcium: 9.6 mg/dL (ref 8.9–10.3)
Chloride: 104 mmol/L (ref 98–111)
Creatinine, Ser: 0.92 mg/dL (ref 0.44–1.00)
GFR, Estimated: >60 mL/min (ref >60)
Glucose, Bld: 101 mg/dL — ABNORMAL HIGH (ref 70–99)
Potassium: 4.1 mmol/L (ref 3.5–5.1)
Sodium: 139 mmol/L (ref 135–145)
Total Bilirubin: 0.6 mg/dL (ref 0.3–1.2)
Total Protein: 6.1 g/dL — ABNORMAL LOW (ref 6.5–8.1)

## 2020-06-27 LAB — CBC
HCT: 30.7 % — ABNORMAL LOW (ref 36.0–46.0)
Hemoglobin: 9.6 g/dL — ABNORMAL LOW (ref 12.0–15.0)
MCH: 26.9 pg (ref 26.0–34.0)
MCHC: 31.3 g/dL (ref 30.0–36.0)
MCV: 86 fL (ref 80.0–100.0)
Platelets: 227 10*3/uL (ref 150–400)
RBC: 3.57 MIL/uL — ABNORMAL LOW (ref 3.87–5.11)
RDW: 17.4 % — ABNORMAL HIGH (ref 11.5–15.5)
WBC: 5.8 10*3/uL (ref 4.0–10.5)
nRBC: 0 % (ref 0.0–0.2)

## 2020-06-27 LAB — GLUCOSE, CAPILLARY: Glucose-Capillary: 85 mg/dL (ref 70–99)

## 2020-06-27 MED ORDER — DILTIAZEM HCL 60 MG PO TABS
60.0000 mg | ORAL_TABLET | Freq: Four times a day (QID) | ORAL | Status: AC
  Administered 2020-06-27 – 2020-06-28 (×3): 60 mg via ORAL
  Filled 2020-06-27 (×3): qty 1

## 2020-06-27 MED ORDER — DILTIAZEM HCL 60 MG PO TABS: 60.0000 mg | ORAL_TABLET | Freq: Four times a day (QID) | ORAL | Status: DC

## 2020-06-27 MED ORDER — DILTIAZEM HCL ER COATED BEADS 120 MG PO CP24
240.0000 mg | ORAL_CAPSULE | Freq: Every day | ORAL | Status: DC
  Administered 2020-06-28: 240 mg via ORAL
  Filled 2020-06-27: qty 2

## 2020-06-27 NOTE — Plan of Care (Signed)
  Problem: Respiratory: Goal: Complications related to the disease process, condition or treatment will be avoided or minimized Outcome: Progressing   Problem: Clinical Measurements: Goal: Will remain free from infection Outcome: Progressing Goal: Diagnostic test results will improve Outcome: Progressing

## 2020-06-27 NOTE — TOC Progression Note (Signed)
Transition of Care (TOC) - Progression Note  Valentina Gu, BSN Transitions of Care Unit 4E- RN Case Manager See Treatment Team for direct phone #    Patient Details  Name: Tammy Davies MRN: [id number] Date of Birth: 07-Jun-1960  Transition of Care [facility]) CM/SW Contact  Dahlia Client Romeo Rabon, RN Phone Number: 06/27/2020, 11:46 AM  Clinical Narrative:    Follow up done this AM with pending [facility] agencies, [place] and [place] both are unable to accept, have still not heard from [place] after 2 attempts, doubtful that they will be able to assist. Have spoke with pt and informed that [facility] can not be secured. Call made to surgical team- spoke with Barth Kirks. Who will discuss with attending wound care plan for transition home.    Expected Discharge Plan: [place] Barriers to Discharge: Continued Medical Work up  Expected Discharge Plan and Services Expected Discharge Plan: [place]   Discharge Planning Services: CM Consult Post Acute Care Choice: [place] arrangements for the past 2 months: Apartment                 DME Arranged: Vac DME Agency: [facility] Date DME Agency Contacted: 06/25/20 Time DME Agency Contacted: 1223 Representative spoke with at DME Agency: Olivia Mackie HH Arranged: RN           Social Determinants of Health ([place]) Interventions    Readmission Risk Interventions No flowsheet data found.

## 2020-06-27 NOTE — Progress Notes (Signed)
Physical Therapy Treatment Patient Details Name: Tammy Davies MRN: [id number] DOB: 1960-11-27 Today's Date: 06/27/2020    History of Present Illness Pt is a 60 y.o. F with significant PMH of paroxysmal A. fib on anticoaguation, tobacco use, HTN, admitted for abdominal pain. CT abdomen pelvis showed sigmoid diverticulitis with small abscess. Patient went for exploratory laparotomy with left colectomy, end colostomy and small bowel resection by Dr. Donne Hazel 06/18/2020.    PT Comments    Pt fully participated in session; pt with improved activity tolerance and balance with ambulation. Performed DGI with minimal deficits noted. Pt with minimal balance deficits during horizontal head movements and stairs. Pt complains of difficulty bending down with therapist providing education on mini squats against wall with pt verbalizing understanding. Pt is safe to d/c home with no follow up PT needed. If remains in acute setting will continue to follow for high level balance activities.    Follow Up Recommendations  No PT follow up;Supervision for mobility/OOB     Equipment Recommendations  None recommended by PT    Recommendations for Other Services       Precautions / Restrictions Precautions Precautions: Fall;Other (comment) Precaution Comments: colostomy, wound vac, JP drain Restrictions Weight Bearing Restrictions: No    Mobility  Bed Mobility Overal bed mobility: Independent                  Transfers Overall transfer level: Independent Equipment used: None                Ambulation/Gait Ambulation/Gait assistance: Supervision;Modified independent (Device/Increase time) Gait Distance (Feet): 1000 Feet         General Gait Details: max HR during ambulation 147 wtih RPE 4/10. Pt requiring S towards end of ambulation due to fatigue and increased lateral weight shifting   Stairs             Wheelchair Mobility    Modified Rankin (Stroke Patients  Only)       Balance                                 Standardized Balance Assessment Standardized Balance Assessment : Dynamic Gait Index   Dynamic Gait Index Level Surface: Normal Change in Gait Speed: Normal Gait with Horizontal Head Turns: Mild Impairment Gait with Vertical Head Turns: Normal Gait and Pivot Turn: Normal Step Over Obstacle: Normal Step Around Obstacles: Normal Steps: Moderate Impairment Total Score: 21      Cognition                                              Exercises      General Comments General comments (skin integrity, edema, etc.): max HR 147, RPE 4/10; education on mini squats to perform against a wall for eccentric strengthening to miprove abillity to bend down during job      Pertinent Vitals/Pain Pain Assessment: No/denies pain    Home Living                      Prior Function            PT Goals (current goals can now be found in the care plan section) Acute Rehab PT Goals Patient Stated Goal: return home and to work next week on light duty PT  Goal Formulation: With patient Time For Goal Achievement: 07/03/20 Potential to Achieve Goals: Good Progress towards PT goals: Progressing toward goals    Frequency    Min 3X/week      PT Plan Current plan remains appropriate    [name]              AM-PAC PT "6 Clicks" Mobility   Outcome Measure  Help needed turning from your back to your side while in a flat bed without using bedrails?: None Help needed moving from lying on your back to sitting on the side of a flat bed without using bedrails?: None Help needed moving to and from a bed to a chair (including a wheelchair)?: None Help needed standing up from a chair using your arms (e.g., wheelchair or bedside chair)?: None Help needed to walk in hospital room?: A Little Help needed climbing 3-5 steps with a railing? : A Little 6 Click Score: 22    End of Session  Equipment Utilized During Treatment: Gait belt Activity Tolerance: Patient tolerated treatment well Patient left: with call bell/phone within reach;in bed Nurse Communication: Mobility status PT Visit Diagnosis: Difficulty in walking, not elsewhere classified (R26.2)     Time: 4827-0786 PT Time Calculation (min) (ACUTE ONLY): 22 min  Charges:  $Gait Training: 8-22 mins                     Tammy Davies, DPT Acute Rehabilitation Services [phone]   Tammy Davies 06/27/2020, 1:17 PM

## 2020-06-27 NOTE — Telephone Encounter (Signed)
Called and scheduled pt to see Robbie Lis, PA-C on 2/16.  Pt appreciative for call.

## 2020-06-27 NOTE — Progress Notes (Signed)
PROGRESS NOTE    TYANNA HACH  BVQ:[id number] DOB: 07-24-1960 DOA: 06/05/2020 PCP: Patient, No Pcp Per    Brief Narrative:  60 y.o.femalewith medical history significant forparoxysmal a fib on noac, smoker, osa not on cpap, htn, history pancreatitis x1, who presents with the above.  Symptoms began a month ago. Began as constipation. Then developed right lower quadrant pain. At first came and went but has worsened. With some associated nausea and decreased appetite. Stools now abnormal, are watery, sometimes with small amounts of red blood. Seen in this ED 1 week ago and diagnosed w/ acute uncomplicated diverticulitis, prescribed augmentin. Took that medicine but LLQ abdominal pain has worsened. Denies fever, no vomiting. No chest pain or cough or sob. No dysuria. No history colonoscopy. 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 -Urology was also consulted for periop ureteral stenting which occurred on 2/1, stents were removed on 2/2. -Currently has JP drain as well as wound VAC in place to the midline with new ostomy which wound care is following-TOC consulted -Completed course of Zosyn -TPN discontinued -Patient able to tolerate soft diet -Per General Surgery, given issues arranging home services, plan to transition to  wet to dry dressing changes. Surgery to remove wound vac 2/11  Paroxysmal atrial fibrillation/atrial flutter -Transition from IV Cardizem to oral -Was placed on argatroban (heparin discontinued given thrombocytopenia) -Now on Eliquis -Tachycardic today. 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  Patient has completed isolation.  DVT Prophylaxis  eliquis  DVT prophylaxis: eliquis Code Status: Full Family Communication: Pt in room, family not at bedside  Status is: Inpatient  Remains inpatient appropriate because:Unsafe d/c plan   Dispo: The patient is from: Home              Anticipated d/c is to: Home              Anticipated d/c date is: 1 day              Patient currently is not medically stable to d/c.   Difficult to place patient No       Consultants:   General Surgery  Procedures:  2/1>> exploratory laparotomy, left colectomy and colostomy 2/1>> cystoscopy with insertion of bilateral ureteral stents  Antimicrobials: Anti-infectives (From admission, onward)   Start     Dose/Rate Route Frequency Ordered Stop   06/18/20 1730  piperacillin-tazobactam (ZOSYN) IVPB 3.375 g  Status:  Discontinued        3.375 g 12.5 mL/hr over 240 Minutes Intravenous Every 8 hours 06/18/20 1644 06/18/20 1644   06/18/20 1730  piperacillin-tazobactam (ZOSYN)  IVPB 3.375 g        3.375 g 12.5 mL/hr over 240 Minutes Intravenous Every 8 hours 06/18/20 1644 06/23/20 0936   06/14/20 0000  ciprofloxacin (CIPRO) 500 MG tablet        500 mg Oral 2 times daily 06/14/20 1100 06/24/20 2359   06/14/20  0000  metroNIDAZOLE (FLAGYL) 500 MG tablet        500 mg Oral 3 times daily 06/14/20 1100 06/24/20 2359   06/11/20 1230  ertapenem (INVANZ) 1,000 mg in sodium chloride 0.9 % 100 mL IVPB  Status:  Discontinued        1 g 200 mL/hr over 30 Minutes Intravenous Every 24 hours 06/11/20 1114 06/18/20 1644   06/06/20 1850  piperacillin-tazobactam (ZOSYN) IVPB 3.375 g  Status:  Discontinued        3.375 g 12.5 mL/hr over 240 Minutes Intravenous Every 8 hours 06/06/20 1850 06/11/20 1114   06/06/20 1830  piperacillin-tazobactam (ZOSYN) IVPB 3.375 g        3.375 g 100 mL/hr over 30 Minutes Intravenous  Once 06/06/20 1818 06/06/20 2039      Subjective: Hoping to go home soon  Objective: Vitals:   06/27/20 0014 06/27/20 0459 06/27/20 0808 06/27/20 1305  BP: 115/82 105/68 (!) 119/56 102/73  Pulse: 100 78 97 (!) 131  Resp: 17 16 18 18   Temp: 98.3 F (36.8 C) 99 F (37.2 C) 98.9 F (37.2 C) 98.9 F (37.2 C)  TempSrc: Oral Oral Oral Oral  SpO2: 95% 95% 98% 95%  Weight:      Height:        Intake/Output Summary (Last 24 hours) at 06/27/2020 1718 Last data filed at 06/27/2020 7622 Gross per 24 hour  Intake 378.41 ml  Output --  Net 378.41 ml   Filed Weights   06/18/20 0814 06/23/20 0330 06/24/20 0406  Weight: 108 kg 110.8 kg 110.8 kg    Examination: General exam: Awake, laying in bed, in nad Respiratory system: Normal respiratory effort, no wheezing Cardiovascular system: tachycardic, s1, s2 Gastrointestinal system: Soft, nondistended, positive BS Central nervous system: CN2-12 grossly intact, strength intact Extremities: Perfused, no clubbing Skin: Normal skin turgor, no notable skin lesions seen Psychiatry: Mood normal // no visual hallucinations    Data Reviewed: I have personally reviewed following labs and imaging studies  CBC: Recent Labs  Lab 06/23/20 1422 06/24/20 0500 06/25/20 0500 06/26/20 0412 06/27/20 0334  WBC 7.5 6.8 5.9 5.3 5.8  HGB 8.8* 8.5* 9.1* 9.3*  9.6*  HCT 28.8* 27.1* 28.3* 30.7* 30.7*  MCV 87.0 85.8 85.0 86.7 86.0  PLT 68* 73* 107* 127* 633   Basic Metabolic Panel: Recent Labs  Lab 06/21/20 0320 06/23/20 0732 06/26/20 1016 06/27/20 0334  NA 133* 134*  --  139  K 4.1 4.2  --  4.1  CL 102 101  --  104  CO2 23 25  --  26  GLUCOSE 140* 119*  --  101*  BUN 11 14  --  10  CREATININE 0.70 0.75  --  0.92  CALCIUM 8.6* 8.6*  --  9.6  MG  --   --  1.8  --    GFR: Estimated Creatinine Clearance: 86 mL/min (by C-G formula based on SCr of 0.92 mg/dL). Liver Function Tests: Recent Labs  Lab 06/27/20 0334  AST 29  ALT 35  ALKPHOS 109  BILITOT 0.6  PROT 6.1*  ALBUMIN 3.0*   No results for input(s): LIPASE, AMYLASE in the last  168 hours. No results for input(s): AMMONIA in the last 168 hours. Coagulation Profile: No results for input(s): INR, PROTIME in the last 168 hours. Cardiac Enzymes: No results for input(s): CKTOTAL, CKMB, CKMBINDEX, TROPONINI in the last 168 hours. BNP (last 3 results) No results for input(s): PROBNP in the last 8760 hours. HbA1C: No results for input(s): HGBA1C in the last 72 hours. CBG: Recent Labs  Lab 06/26/20 0538 06/26/20 0830 06/26/20 1204 06/26/20 1629 06/27/20 0630  GLUCAP 89 123* 89 100* 85   Lipid Profile: No results for input(s): CHOL, HDL, LDLCALC, TRIG, CHOLHDL, LDLDIRECT in the last 72 hours. Thyroid Function Tests: No results for input(s): TSH, T4TOTAL, FREET4, T3FREE, THYROIDAB in the last 72 hours. Anemia Panel: No results for input(s): VITAMINB12, FOLATE, FERRITIN, TIBC, IRON, RETICCTPCT in the last 72 hours. Sepsis Labs: No results for input(s): PROCALCITON, LATICACIDVEN in the last 168 hours.  No results found for this or any previous visit (from the past 240 hour(s)).   Radiology Studies: No results found.  Scheduled Meds: . acetaminophen  1,000 mg Oral Q6H  . apixaban  5 mg Oral BID  . Chlorhexidine Gluconate Cloth  6 each Topical Daily  . [START ON  06/28/2020] diltiazem  240 mg Oral Daily  . diltiazem  60 mg Oral Q6H  . docusate sodium  100 mg Oral BID  . feeding supplement  237 mL Oral TID BM  . lip balm  1 application Topical BID  . methocarbamol  500 mg Oral TID  . multivitamin with minerals  1 tablet Oral Daily  . polycarbophil  625 mg Oral BID   Continuous Infusions: . methocarbamol (ROBAXIN) IV 1,000 mg (06/20/20 0347)     LOS: 21 days   Marylu Lund, MD [facility] Pager On Amion  If 7PM-7AM, please contact night-coverage 06/27/2020, 5:18 PM

## 2020-06-27 NOTE — Progress Notes (Signed)
Pt's HR has been fluctuating from 120s-150s most of the morning, even while at rest.  She is asymptomatic for tachycardia.  MD notified and cardizem dose increased.

## 2020-06-27 NOTE — Telephone Encounter (Signed)
Mose cone called in and stated this pt has been in the hosp for 20 days .  Pt needs a in office fu with Dr Tamala Julian or App but there is not in office in the time frame needed to be seen.  She will be discharged by tomorrow   Best number -[phone]

## 2020-06-27 NOTE — Progress Notes (Addendum)
9 Days Post-Op    CC: Abdominal pain  Subjective: Patient sitting up in the chair eating peanut butter and crackers.  Distaste pretty good her right now.  Still having issues with taste.  Her colostomy is working well.  The wound VAC was changed yesterday.  This showed a 50% improvement in her wound depth.  She is worried the insurance will not cover her wound VAC, and that there is a question of available nursing care at home to change the wound VAC.  We talked about the pros and cons of both wet-to-dry, and wound VAC therapies.  Overall she is ready to go home.  Objective: Vital signs in last 24 hours: Temp:  [98.1 F (36.7 C)-99.1 F (37.3 C)] 99 F (37.2 C) (02/10 0459) Pulse Rate:  [78-106] 78 (02/10 0459) Resp:  [16-18] 16 (02/10 0459) BP: (105-144)/(67-82) 105/68 (02/10 0459) SpO2:  [94 %-96 %] 95 % (02/10 0459) Last BM Date: 06/26/20 Tm 99.3 VSS Labs OK 2/3 ABX: ileus  Intake/Output from previous day: 02/09 0701 - 02/10 0700 In: 138.4 [I.V.:138.4] Out: 25 [Drains:25] Intake/Output this shift: No intake/output data recorded.  General appearance: alert, cooperative and no distress Resp: clear to auscultation bilaterally Cardio: Tachycardic heart rate in the 120s. GI: Soft, sore, wound vacs in place.  Ostomy is working well.  Lab Results:  Recent Labs    06/26/20 0412 06/27/20 0334  WBC 5.3 5.8  HGB 9.3* 9.6*  HCT 30.7* 30.7*  PLT 127* 227    BMET Recent Labs    06/27/20 0334  NA 139  K 4.1  CL 104  CO2 26  GLUCOSE 101*  BUN 10  CREATININE 0.92  CALCIUM 9.6   PT/INR No results for input(s): LABPROT, INR in the last 72 hours.  Recent Labs  Lab 06/27/20 0334  AST 29  ALT 35  ALKPHOS 109  BILITOT 0.6  PROT 6.1*  ALBUMIN 3.0*     Lipase     Component Value Date/Time   LIPASE 19 06/16/2020 0406     Medications: . acetaminophen  1,000 mg Oral Q6H  . apixaban  5 mg Oral BID  . Chlorhexidine Gluconate Cloth  6 each Topical Daily  .  diltiazem  30 mg Oral Q6H  . docusate sodium  100 mg Oral BID  . feeding supplement  237 mL Oral TID BM  . lip balm  1 application Topical BID  . methocarbamol  500 mg Oral TID  . multivitamin with minerals  1 tablet Oral Daily  . polycarbophil  625 mg Oral BID    Assessment/Plan  Paroxysmal A. Fib on Eliquis at home  OSA HTN COVID +- off precautions  No prior colonoscopy Thrombocytopenia  Acutesigmoiddiverticulitis  S/p Ex-lap, left colectomy, end colostomy, SBR, drain placement - Dr. Donne Hazel, 06/18/2020 - POD #9 - Pathbenign. - Vac to midline. M/W/F -WOCN following for new ostomy. She did well with teaching this week. CM working on setting up [facility] for new ostomy and vac - OOB, ambulate/mobilize, PT has seen and recommending no f/u - Pulm toilet -Okay for d/c from our standpoint. I have written for drain to be removed prior to d/c. I have sent her pain meds to the pharmacy. I reached out to [facility] to let them know.  FEN -Soft diet VTE - SCDs, Eliquis (hgb stable at 9.3) ID - Zosyn through2/1-06/23/20 [place] - d/c-ed 2/3 Follow-up - Dr. Donne Hazel. 07/16/2020 at 2:10 PM.  Plan: Okay for discharge from our standpoint.  Follow-up  for our service is in the AVS.  Medicines were sent to the pharmacy yesterday for pain control.  Called and apparently they cannot get pt home wound vac set up due to lack of Home Health care nursing. Her daughter will be here in the AM;  patient wants to take wound vac off then and have teaching for home care using wet to dry dressings at that time.  Discussed with the staff nurse who has her today.      LOS: 21 days    Tammy Davies 06/27/2020 Please see Amion

## 2020-06-28 ENCOUNTER — Other Ambulatory Visit (HOSPITAL_COMMUNITY): Payer: Self-pay | Admitting: Internal Medicine

## 2020-06-28 LAB — CBC
HCT: 28.3 % — ABNORMAL LOW (ref 36.0–46.0)
Hemoglobin: 9.1 g/dL — ABNORMAL LOW (ref 12.0–15.0)
MCH: 27.7 pg (ref 26.0–34.0)
MCHC: 32.2 g/dL (ref 30.0–36.0)
MCV: 86 fL (ref 80.0–100.0)
Platelets: 234 10*3/uL (ref 150–400)
RBC: 3.29 MIL/uL — ABNORMAL LOW (ref 3.87–5.11)
RDW: 17.2 % — ABNORMAL HIGH (ref 11.5–15.5)
WBC: 5.9 10*3/uL (ref 4.0–10.5)
nRBC: 0 % (ref 0.0–0.2)

## 2020-06-28 LAB — COMPREHENSIVE METABOLIC PANEL
ALT: 30 U/L (ref 0–44)
AST: 23 U/L (ref 15–41)
Albumin: 2.8 g/dL — ABNORMAL LOW (ref 3.5–5.0)
Alkaline Phosphatase: 97 U/L (ref 38–126)
Anion gap: 9 (ref 5–15)
BUN: 11 mg/dL (ref 6–20)
CO2: 23 mmol/L (ref 22–32)
Calcium: 8.9 mg/dL (ref 8.9–10.3)
Chloride: 103 mmol/L (ref 98–111)
Creatinine, Ser: 0.82 mg/dL (ref 0.44–1.00)
GFR, Estimated: >60 mL/min (ref >60)
Glucose, Bld: 91 mg/dL (ref 70–99)
Potassium: 3.8 mmol/L (ref 3.5–5.1)
Sodium: 135 mmol/L (ref 135–145)
Total Bilirubin: 0.6 mg/dL (ref 0.3–1.2)
Total Protein: 5.6 g/dL — ABNORMAL LOW (ref 6.5–8.1)

## 2020-06-28 LAB — SEROTONIN RELEASE ASSAY (SRA)
SRA .2 IU/mL UFH Ser-aCnc: 11 % (ref 0–20)
SRA 100IU/mL UFH Ser-aCnc: 1 % (ref 0–20)

## 2020-06-28 LAB — GLUCOSE, CAPILLARY: Glucose-Capillary: 87 mg/dL (ref 70–99)

## 2020-06-28 LAB — MAGNESIUM: Magnesium: 1.8 mg/dL (ref 1.7–2.4)

## 2020-06-28 MED ORDER — DILTIAZEM HCL ER COATED BEADS 240 MG PO CP24: 240.0000 mg | ORAL_CAPSULE | Freq: Every day | ORAL | 0 refills | Status: DC

## 2020-06-28 MED FILL — CARTIA XT 240 MG CAPSULE: 240 | 30 days supply | Qty: 30 | Fill #0

## 2020-06-28 NOTE — Discharge Summary (Signed)
Physician Discharge Summary  Tammy Davies ZRA:[id number] DOB: 1960/08/25 DOA: 06/05/2020  PCP: Patient, No Pcp Per  Admit date: 06/05/2020 Discharge date: 06/28/2020  Admitted From: Home Disposition:  Home  Recommendations for Outpatient Follow-up:  1. Follow up with PCP as scheduled  2. Follow up with General Surgery as scheduled 3. Follow up with Cardiology as scheduled  Discharge Condition:Stable CODE STATUS:Full Diet recommendation: Soft, Heart healthy   Brief/Interim Summary: 60 y.o.femalewith medical history significant forparoxysmal a fib on noac, smoker, osa not on cpap, htn, history pancreatitis x1, who presents with the above.  Symptoms began a month ago. Began as constipation. Then developed right lower quadrant pain. At first came and went but has worsened. With some associated nausea and decreased appetite. Stools now abnormal, are watery, sometimes with small amounts of red blood. Seen in this ED 1 week ago and diagnosed w/ acute uncomplicated diverticulitis, prescribed augmentin. Took that medicine but LLQ abdominal pain has worsened. Denies fever, no vomiting. No chest pain or cough or sob. No dysuria. No history colonoscopy. 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 -Urology was also consulted for  periop ureteral stenting which occurred on 2/1, stents were removed on 2/2. -Currently has JP drain as well as wound VAC in place to the midline with new ostomy which wound care is following-TOC consulted -Completed course of Zosyn -TPN discontinued -Patient able to tolerate soft diet -Per General Surgery, VAC removed in favor of wet to dry changes given issues arranging [facility] services.  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 Patient has completed isolation.   Discharge Instructions  Discharge Instructions    Diet - low sodium heart healthy   Complete by: As directed    Increase activity slowly   Complete by: As directed      Allergies as of 06/28/2020      Reactions   Heparin Other (See Comments)   06/23/20 HIT AB ODT =3 very high likelihood of true HIT; 2/7 SRA pending       Medication List    STOP taking these medications   amoxicillin-clavulanate 875-125 MG tablet Commonly known as: AUGMENTIN   ibuprofen 200 MG tablet Commonly known as: ADVIL     TAKE these medications   apixaban 5 MG Tabs tablet Commonly known as: Eliquis Take 1 tablet (5 mg total) by mouth 2 (two) times daily.   dicyclomine 20 MG tablet Commonly known as: BENTYL Take  1 tablet (20 mg total) by mouth every 8 (eight) hours as needed for spasms (Abdominal cramping).   diltiazem 240 MG 24 hr  capsule Commonly known as: CARDIZEM CD Take 1 capsule (240 mg total) by mouth daily. Start taking on: June 29, 2020 What changed:   medication strength  how much to take   multivitamin capsule Take 1 capsule by mouth daily.   ondansetron 4 MG disintegrating tablet Commonly known as: Zofran ODT Take 1 tablet (4 mg total) by mouth every 8 (eight) hours as needed for nausea or vomiting.   oxyCODONE 5 MG immediate release tablet Commonly known as: Oxy IR/ROXICODONE Take 1 tablet (5 mg total) by mouth every 6 (six) hours as needed for breakthrough pain.     ASK your doctor about these medications   ciprofloxacin 500 MG tablet Commonly known as: Cipro Take 1 tablet (500 mg total) by mouth 2 (two) times daily for 10 days. Ask about: Should I take this medication?   metroNIDAZOLE 500 MG tablet Commonly known as: Flagyl Take 1 tablet (500 mg total) by mouth 3 (three) times daily for 10 days. Ask about: Should I take this medication?       Follow-up Information    [facility] Follow up.   Why: Call to schedule follow up appointment Contact information: [address]       Rolm Bookbinder, MD Follow up on 07/16/2020.   Specialty: General Surgery Why: Your appointment is at 2:10 p.m. Be at the office 30 min early for check-in. Bring a Clinical research associate ID and insurance information. Contact information: [place] [id number] [phone]        Belva Crome, MD Follow up.   Specialty: Cardiology Why: Office will reach out to you to schedule appointment for cardiology follow up Contact information: [address]  [place] [address] [phone]              Allergies  Allergen Reactions  . Heparin Other (See Comments)    06/23/20 HIT AB ODT =3 very high likelihood of true HIT; 2/7 SRA pending     Consultations:  General Surgery  Procedures/Studies: DG Abd 1 View  Result Date: 06/20/2020 CLINICAL DATA:   Nausea and vomiting.  Recent abdominal surgery. EXAM: ABDOMEN - 1 VIEW COMPARISON:  CT 06/15/2020.  Abdomen series 06/15/2020. FINDINGS: Surgical clips right upper quadrant. Drainage catheter noted over the pelvis. Catheter noted over the mid abdomen. Stomach is nondistended. Slightly prominent air-filled loops of small bowel noted. Colon is nondistended. Mild adynamic ileus could present in this fashion. Follow-up exam suggested to exclude developing bowel obstruction. Hemidiaphragms incompletely imaged. No free air identified. Degenerative change and scoliosis lumbar spine and both hips. Pelvic calcifications consistent phleboliths. IMPRESSION: Mildly prominent air-filled loops of small bowel noted. Colon is nondistended. Mild adynamic ileus could present in this fashion. Follow-up exam suggested to exclude developing bowel obstruction. Electronically Signed   By: Marcello Moores  Register   On: 06/20/2020 06:32   CT ABDOMEN PELVIS W CONTRAST  Result Date: 06/15/2020 CLINICAL DATA:  Abdominal distension.  COVID positive. EXAM: CT ABDOMEN AND PELVIS WITH CONTRAST TECHNIQUE: Multidetector CT imaging of the abdomen and pelvis was performed using the standard protocol following bolus administration of intravenous contrast. CONTRAST:  18m OMNIPAQUE IOHEXOL 300 MG/ML  SOLN COMPARISON:  June 10, 2020 FINDINGS: Lower chest: There are small bilateral pleural effusions with adjacent atelectasis.The heart size is normal. Hepatobiliary: Again noted is a  large hemangioma in the right hepatic lobe. There is a similar hypoattenuating area adjacent to the gallbladder fossa. The patient is status post prior cholecystectomy.There is no biliary ductal dilation. Pancreas: Normal contours without ductal dilatation. No peripancreatic fluid collection. Spleen: Unremarkable. Adrenals/Urinary Tract: --Adrenal glands: Unremarkable. --Right kidney/ureter: No hydronephrosis or radiopaque kidney stones. --Left kidney/ureter: No  hydronephrosis or radiopaque kidney stones. --Urinary bladder: Unremarkable. Stomach/Bowel: --Stomach/Duodenum: No hiatal hernia or other gastric abnormality. Normal duodenal course and caliber. --Small bowel: Unremarkable. --Colon: Again noted are extensive inflammatory changes of the sigmoid colon consistent with sigmoid diverticulitis. These changes have substantially worsened since the prior study. There are now appears to be some degree of obstruction at this level as evidence by mildly dilated upstream colon with air-fluid levels. There are scattered colonic diverticula throughout the remaining portions of the colon. There is no abscess. There is no free air. --Appendix: Normal. Vascular/Lymphatic: Atherosclerotic calcification is present within the non-aneurysmal abdominal aorta, without hemodynamically significant stenosis. --No retroperitoneal lymphadenopathy. --No mesenteric lymphadenopathy. --No pelvic or inguinal lymphadenopathy. Reproductive: Unremarkable Other: No ascites or free air. The abdominal wall is normal. Musculoskeletal. No acute displaced fractures. IMPRESSION: 1. Worsening sigmoid diverticulitis, still without evidence for perforation or abscess formation. There now appears to be some degree of obstruction as evidence by dilatation of the upstream colon now with associated air-fluid levels. 2. Growing small bilateral pleural effusions. 3. Additional chronic findings as detailed above. Aortic Atherosclerosis (ICD10-I70.0). Electronically Signed   By: Constance Holster M.D.   On: 06/15/2020 17:09   CT ABDOMEN PELVIS W CONTRAST  Result Date: 06/10/2020 CLINICAL DATA:  60 year old female with abdominal pain. Concern for acute diverticulitis. EXAM: CT ABDOMEN AND PELVIS WITH CONTRAST TECHNIQUE: Multidetector CT imaging of the abdomen and pelvis was performed using the standard protocol following bolus administration of intravenous contrast. CONTRAST:  143m OMNIPAQUE IOHEXOL 300 MG/ML  SOLN  COMPARISON:  CT abdomen pelvis dated 06/06/2020. FINDINGS: Lower chest: Partially visualized small bilateral pleural effusions, new since the prior CT. There is associated partial compressive atelectasis of the lower lobes. Pneumonia is less likely but not excluded clinical correlation is recommended. No intra-abdominal free air. Small free fluid in the pelvis. Hepatobiliary: Fatty liver. Large right hepatic hemangioma better characterized on the prior MRI. No intrahepatic biliary ductal dilatation. Cholecystectomy. Stable appearance of hypoenhancing area in the left lobe of the liver superior to the cholecystectomy. Pancreas: Unremarkable. No pancreatic ductal dilatation or surrounding inflammatory changes. Spleen: Normal in size without focal abnormality. Adrenals/Urinary Tract: The adrenal glands unremarkable. The kidneys, visualized ureters, and urinary bladder appear unremarkable. Stomach/Bowel: There is sigmoid diverticulosis with diffusely thickened and inflamed sigmoid colon most likely acute diverticulitis, and less likely colitis. Clinical correlation is recommended. Overall no significant improvement in the inflammatory changes of the sigmoid colon compared to prior CT. Small amount of partially loculated fluid or phlegmon to the left of the sigmoid colon as seen on the prior CT. No discrete drainable fluid collection or abscess identified. There is a 2.2 x 1.8 cm ill-defined inflammatory area inferior to the sigmoid colon (76/3). No extraluminal air. There is no bowel obstruction. The appendix is normal. Vascular/Lymphatic: Mild aortoiliac atherosclerotic disease. The IVC is unremarkable. No portal venous gas. There is no adenopathy. Reproductive: The uterus is anteverted and grossly unremarkable. No adnexal masses. Other: Midline vertical anterior pelvic wall incisional scar. Musculoskeletal: Osteopenia with degenerative changes of the spine. No acute osseous pathology. IMPRESSION: 1. Persistent sigmoid  diverticulitis. No drainable fluid collection or abscess. 2.  Partially visualized small bilateral pleural effusions, new since the prior CT. 3. Fatty liver. Large right hepatic hemangioma better characterized on the prior MRI. 4. Aortic Atherosclerosis (ICD10-I70.0). Electronically Signed   By: Anner Crete M.D.   On: 06/10/2020 16:22   CT ABDOMEN PELVIS W CONTRAST  Result Date: 06/06/2020 CLINICAL DATA:  Diverticulitis, constipation, worsening pain EXAM: CT ABDOMEN AND PELVIS WITH CONTRAST TECHNIQUE: Multidetector CT imaging of the abdomen and pelvis was performed using the standard protocol following bolus administration of intravenous contrast. CONTRAST:  174m OMNIPAQUE IOHEXOL 300 MG/ML  SOLN COMPARISON:  CT abdomen pelvis, 05/29/2020, abdominal MR, 08/15/2016 FINDINGS: Lower chest: No acute abnormality. 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 Status post cholecystectomy. No biliary ductal dilatation. Pancreas: Unremarkable. No pancreatic ductal dilatation or surrounding inflammatory changes. Spleen: Normal in size without significant abnormality. Adrenals/Urinary Tract: Adrenal glands are unremarkable. Kidneys are normal, without renal calculi, solid lesion, or hydronephrosis. Bladder is unremarkable. Stomach/Bowel: Stomach is within normal limits. Incidental diverticula of the descending portion of the duodenum. Appendix appears normal. Sigmoid diverticulosis with redemonstrated long segment wall thickening and adjacent fat stranding about the mid to distal sigmoid colon. Suspect a small phlegmon or abscess just inferior to the distal sigmoid colon, closely abutting the posterior aspect of the lower uterine segment, measuring approximately 3.4 x 3.3 x 2.7 cm (series 3, image 74, series 7, image 76). Unchanged  small volume of fluid posterior to the midportion of the sigmoid measuring approximately 3.4 x 1.7 cm (series 3, image 70). Vascular/Lymphatic: No significant vascular findings are present. No enlarged abdominal or pelvic lymph nodes. Reproductive: No mass or other significant abnormality. Other: No abdominal wall hernia or abnormality. No abdominopelvic ascites. Musculoskeletal: No acute or significant osseous findings. IMPRESSION: 1. Sigmoid diverticulosis with redemonstrated long segment wall thickening and adjacent fat stranding about the mid to distal sigmoid colon, consistent with acute diverticulitis. 2. Suspect a small phlegmon or abscess just inferior to the distal sigmoid colon, closely abutting the posterior aspect of the lower uterine segment, measuring approximately 3.4 x 3.3 x 2.7 cm, which is more defined appearing than on prior examination. 3. Unchanged small volume of fluid posterior to the midportion of the sigmoid, which is not clearly loculated. 4. 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 Electronically Signed   By: AEddie CandleM.D.   On: 06/06/2020 15:01   CT ABDOMEN PELVIS W CONTRAST  Addendum Date: 05/29/2020   ADDENDUM REPORT: 05/29/2020 12:53 ADDENDUM: This case was discussed with Dr. DRoslynn Amblefollowing its interpretation. He reports that there was small volume contrast extravasation at the IV site during the injection for the CT which had not been reported in the technologist notes. Technologist indicates this was approximately 10 cc of contrast. Small extravasation volumes of contrast are typically without consequence. Recommend elevation of the extremity with intermittent cold and hot compresses. Electronically Signed   By: WRichardean SaleM.D.   On: 05/29/2020 12:53   Result Date: 05/29/2020 CLINICAL DATA:  Constipation for 1 month with left lower quadrant  abdominal pain. Diverticulitis suspected. EXAM: CT ABDOMEN AND PELVIS WITH CONTRAST TECHNIQUE: Multidetector CT imaging of the abdomen and pelvis was performed using the standard protocol following bolus administration of intravenous contrast. CONTRAST:  833mOMNIPAQUE IOHEXOL 300  MG/ML  SOLN COMPARISON:  Abdominal MRI 08/15/2016. FINDINGS: Lower chest: Clear lung bases. No significant pleural or pericardial effusion. Hepatobiliary: Mass involving segments 6 and 7 previously characterized as a hemangioma on MRI is unchanged in size, measuring 8.3 x 6.6 cm on image 28/3. There is focal fat superior to the cholecystectomy bed. No new or enlarging hepatic lesions. No significant biliary dilatation post cholecystectomy. Pancreas: Unremarkable. No pancreatic ductal dilatation or surrounding inflammatory changes. Spleen: Normal in size without focal abnormality. Adrenals/Urinary Tract: Both adrenal glands appear normal. The kidneys appear normal without evidence of urinary tract calculus, suspicious lesion or hydronephrosis. No bladder abnormalities are seen. Stomach/Bowel: No enteric contrast was administered. The stomach and small bowel appear normal aside from a small proximal duodenal diverticulum. The appendix and proximal colon appear normal. There is long segment wall thickening of the sigmoid colon associated with diverticulosis and surrounding inflammatory changes, consistent with acute diverticulitis. There is a small amount of pericolonic fluid on the left measuring 3.5 x 1.5 cm on image 74/3, but no well-defined extraluminal fluid collection to suggest an abscess. No evidence of bowel obstruction or free air. Vascular/Lymphatic: There are no enlarged abdominal or pelvic lymph nodes. Minimal aortoiliac atherosclerosis without acute vascular findings. The portal, superior mesenteric and splenic veins are patent. Reproductive: The uterus and ovaries appear normal. No adnexal mass. Other: As above, inflammatory  changes in the pelvis with a small extraluminal fluid collection lateral to the sigmoid colon. No ascites or free air. Musculoskeletal: No acute or significant osseous findings. Lumbar spondylosis noted. IMPRESSION: 1. Findings are consistent with acute sigmoid diverticulitis. There is a small amount of pericolonic fluid on the left, but no well-defined extraluminal fluid collection to suggest an abscess. No evidence of bowel obstruction or free air. 2. Stable hepatic hemangioma. 3. Aortic Atherosclerosis (ICD10-I70.0). Electronically Signed: By: Richardean Sale M.D. On: 05/29/2020 12:06   DG Retrograde Pyelogram  Result Date: 06/18/2020 CLINICAL DATA:  Bilateral stent placement with retrograde pyelogram. EXAM: INTRAOPERATIVE BILATERAL RETROGRADE UROGRAPHY TECHNIQUE: Images were obtained with the C-arm fluoroscopic device intraoperatively and submitted for interpretation post-operatively. Please see the procedural report for the amount of contrast and the fluoroscopy time utilized. COMPARISON:  CT abdomen pelvis 06/15/2020 FINDINGS: Five intraoperative fluoroscopic images are submitted for interpretation. There is mild irregular narrowing of the distal right ureter. Rounded filling defect in the distal left ureter most likely air bubble or debris. Bilateral ureteral stents are noted. IMPRESSION: Intraoperative fluoroscopic images of retrograde urography as above. Electronically Signed   By: Miachel Roux M.D.   On: 06/18/2020 11:10   DG CHEST PORT 1 VIEW  Result Date: 06/16/2020 CLINICAL DATA:  PICC line placement EXAM: PORTABLE CHEST 1 VIEW COMPARISON:  06/11/2020 FINDINGS: Normal cardiac silhouette. PICC line pace with tip in distal SVC. Patchy airspace markings. No pneumothorax. No focal consolidation. IMPRESSION: RIGHT PICC line placed without complication. Patchy airspace opacities in lower lobes. Electronically Signed   By: Suzy Bouchard M.D.   On: 06/16/2020 14:16   DG Chest Port 1 View  Result  Date: 06/11/2020 CLINICAL DATA:  Dyspnea.  COVID positive EXAM: PORTABLE CHEST 1 VIEW COMPARISON:  10/11/2017 FINDINGS: Cardiac enlargement without heart failure. Lungs are clear without infiltrate or effusion. IMPRESSION: No active disease. Electronically Signed   By: Franchot Gallo M.D.   On: 06/11/2020 10:22   DG ABD ACUTE 2+V W 1V CHEST  Result Date: 06/15/2020 CLINICAL DATA:  Perforated abdominal viscus EXAM: DG ABDOMEN ACUTE WITH 1 VIEW CHEST COMPARISON:  Chest x-ray 06/11/2020, CT abdomen pelvis 06/10/2020 FINDINGS: There is no evidence of dilated bowel loops or free intraperitoneal air. No radiopaque calculi or other significant radiographic abnormality is seen. Surgical clips overlie the right upper quadrant. The heart size and mediastinal contours are unchanged. Bibasilar compressive changes. No definite focal consolidation. No pulmonary edema. No pleural effusion. No pneumothorax. No acute osseous abnormality. IMPRESSION: Negative abdominal radiographs.  No acute cardiopulmonary disease. Electronically Signed   By: Iven Finn M.D.   On: 06/15/2020 04:06   DG ABD ACUTE 2+V W 1V CHEST  Result Date: 06/14/2020 CLINICAL DATA:  Abdominal pain and emesis EXAM: DG ABDOMEN ACUTE WITH 1 VIEW CHEST COMPARISON:  Chest radiograph June 11, 2020; CT abdomen and pelvis June 10, 2020 FINDINGS: AP chest: There is no edema or airspace opacity. Heart is borderline enlarged with pulmonary vascularity normal. No adenopathy. Supine and upright abdomen: There is moderate stool in the colon. There is no bowel dilatation or air-fluid level to suggest bowel obstruction. No free air. There are surgical clips in the right upper abdomen. IMPRESSION: No bowel obstruction or free air evident. Moderate stool in colon. No edema or airspace opacity. Borderline cardiac enlargement. Electronically Signed   By: Lowella Grip III M.D.   On: 06/14/2020 10:48   [place] EKG SITE RITE  Result Date: 06/16/2020 If Site Rite  image not attached, placement could not be confirmed due to current cardiac rhythm.  [place] EKG SITE RITE  Result Date: 06/16/2020 If Site Rite image not attached, placement could not be confirmed due to current cardiac rhythm.    Subjective: Eager to go home  Discharge Exam: Vitals:   06/28/20 0445 06/28/20 0821  BP: 107/63 106/70  Pulse: 78 (!) 103  Resp: 17 16  Temp: 98.1 F (36.7 C) 99.1 F (37.3 C)  SpO2: 97% 94%   Vitals:   06/27/20 2155 06/28/20 0015 06/28/20 0445 06/28/20 0821  BP:  (!) 109/56 107/63 106/70  Pulse:  100 78 (!) 103  Resp:  15 17 16   Temp: 97.8 F (36.6 C) 97.8 F (36.6 C) 98.1 F (36.7 C) 99.1 F (37.3 C)  TempSrc: Oral Oral Oral Oral  SpO2:  96% 97% 94%  Weight:      Height:        General: Pt is alert, awake, not in acute distress Cardiovascular: RRR, S1/S2 +, no rubs, no gallops Respiratory: CTA bilaterally, no wheezing, no rhonchi Abdominal: Soft, NT, ND, bowel sounds + Extremities: no edema, no cyanosis   The results of significant diagnostics from this hospitalization (including imaging, microbiology, ancillary and laboratory) are listed below for reference.     Microbiology: No results found for this or any previous visit (from the past 240 hour(s)).   Labs: BNP (last 3 results) Recent Labs    06/08/20 0500 06/11/20 0956  BNP 113.1* 382.5*   Basic Metabolic Panel: Recent Labs  Lab 06/23/20 0732 06/26/20 1016 06/27/20 0334 06/28/20 0024  NA 134*  --  139 135  K 4.2  --  4.1 3.8  CL 101  --  104 103  CO2 25  --  26 23  GLUCOSE 119*  --  101* 91  BUN 14  --  10 11  CREATININE 0.75  --  0.92 0.82  CALCIUM 8.6*  --  9.6 8.9  MG  --  1.8  --  1.8   Liver Function Tests: Recent Labs  Lab 06/27/20 0334 06/28/20 0024  AST 29 23  ALT  35 30  ALKPHOS 109 97  BILITOT 0.6 0.6  PROT 6.1* 5.6*  ALBUMIN 3.0* 2.8*   No results for input(s): LIPASE, AMYLASE in the last 168 hours. No results for input(s): AMMONIA in the  last 168 hours. CBC: Recent Labs  Lab 06/24/20 0500 06/25/20 0500 06/26/20 0412 06/27/20 0334 06/28/20 0024  WBC 6.8 5.9 5.3 5.8 5.9  HGB 8.5* 9.1* 9.3* 9.6* 9.1*  HCT 27.1* 28.3* 30.7* 30.7* 28.3*  MCV 85.8 85.0 86.7 86.0 86.0  PLT 73* 107* 127* 227 234   Cardiac Enzymes: No results for input(s): CKTOTAL, CKMB, CKMBINDEX, TROPONINI in the last 168 hours. BNP: Invalid input(s): POCBNP CBG: Recent Labs  Lab 06/26/20 0830 06/26/20 1204 06/26/20 1629 06/27/20 0630 06/28/20 0624  GLUCAP 123* 89 100* 85 87   D-Dimer No results for input(s): DDIMER in the last 72 hours. Hgb A1c No results for input(s): HGBA1C in the last 72 hours. Lipid Profile No results for input(s): CHOL, HDL, LDLCALC, TRIG, CHOLHDL, LDLDIRECT in the last 72 hours. Thyroid function studies No results for input(s): TSH, T4TOTAL, T3FREE, THYROIDAB in the last 72 hours.  Invalid input(s): FREET3 Anemia work up No results for input(s): VITAMINB12, FOLATE, FERRITIN, TIBC, IRON, RETICCTPCT in the last 72 hours. Urinalysis    Component Value Date/Time   COLORURINE AMBER (A) 05/29/2020 0951   APPEARANCEUR HAZY (A) 05/29/2020 0951   LABSPEC 1.028 05/29/2020 0951   PHURINE 5.0 05/29/2020 0951   GLUCOSEU NEGATIVE 05/29/2020 0951   HGBUR NEGATIVE 05/29/2020 0951   BILIRUBINUR NEGATIVE 05/29/2020 0951   KETONESUR 5 (A) 05/29/2020 0951   PROTEINUR 30 (A) 05/29/2020 0951   UROBILINOGEN 0.2 09/09/2016 1206   NITRITE NEGATIVE 05/29/2020 0951   LEUKOCYTESUR NEGATIVE 05/29/2020 0951   Sepsis Labs Invalid input(s): PROCALCITONIN,  WBC,  LACTICIDVEN Microbiology No results found for this or any previous visit (from the past 240 hour(s)).  Time spent: 30 min  SIGNED:   Marylu Lund, MD  [facility] 06/28/2020, 9:01 AM  If 7PM-7AM, please contact night-coverage

## 2020-06-28 NOTE — Progress Notes (Signed)
10 Days Post-Op      Subjective: I remove the wound VAC and taught the patient daughter how to do the wet-to-dry dressing change.  She is tolerating p.o.'s well.  No further nausea.  Her ostomy is working well.  She is ready for discharge.  Objective: Vital signs in last 24 hours: Temp:  [97.8 F (36.6 C)-98.9 F (37.2 C)] 98.1 F (36.7 C) (02/11 0445) Pulse Rate:  [78-131] 78 (02/11 0445) Resp:  [15-20] 17 (02/11 0445) BP: (100-119)/(56-73) 107/63 (02/11 0445) SpO2:  [95 %-98 %] 97 % (02/11 0445) Last BM Date: 06/27/20 240 Po recorded 50 from drain  No other I/o recorded Afebrile, VSS Labs OK  Intake/Output from previous day: 02/10 0701 - 02/11 0700 In: 240 [P.O.:240] Out: 50 [Drains:50] Intake/Output this shift: No intake/output data recorded.  General appearance: alert, cooperative and no distress Resp: clear to auscultation bilaterally GI: Midline incision looks fine and is healing well.  Ostomy is working well.  Tolerating p.o.'s well.  Lab Results:  Recent Labs    06/27/20 0334 06/28/20 0024  WBC 5.8 5.9  HGB 9.6* 9.1*  HCT 30.7* 28.3*  PLT 227 234    BMET Recent Labs    06/27/20 0334 06/28/20 0024  NA 139 135  K 4.1 3.8  CL 104 103  CO2 26 23  GLUCOSE 101* 91  BUN 10 11  CREATININE 0.92 0.82  CALCIUM 9.6 8.9   PT/INR No results for input(s): LABPROT, INR in the last 72 hours.  Recent Labs  Lab 06/27/20 0334 06/28/20 0024  AST 29 23  ALT 35 30  ALKPHOS 109 97  BILITOT 0.6 0.6  PROT 6.1* 5.6*  ALBUMIN 3.0* 2.8*     Lipase     Component Value Date/Time   LIPASE 19 06/16/2020 0406     Medications: . acetaminophen  1,000 mg Oral Q6H  . apixaban  5 mg Oral BID  . Chlorhexidine Gluconate Cloth  6 each Topical Daily  . diltiazem  240 mg Oral Daily  . docusate sodium  100 mg Oral BID  . feeding supplement  237 mL Oral TID BM  . lip balm  1 application Topical BID  . methocarbamol  500 mg Oral TID  . multivitamin with minerals  1  tablet Oral Daily  . polycarbophil  625 mg Oral BID    Assessment/Plan Paroxysmal A. Fib on Eliquis at home  OSA HTN COVID +- off precautions  No prior colonoscopy Thrombocytopenia  Acutesigmoiddiverticulitis  S/p Ex-lap, left colectomy, end colostomy, SBR, drain placement - Dr. Donne Hazel, 06/18/2020 - POD #10 - Pathbenign. - Vac to midline. M/W/F -WOCN following for new ostomy. She did well with teachingthis week.CM working on setting up [facility] for new ostomy and vac - OOB, ambulate/mobilize, PT has seen and recommending no f/u - Pulm toilet -Okay for d/c from our standpoint. I have written for drain to be removed prior to d/c.I have sent her pain meds to the pharmacy.I reached out to [facility] to let them know.  FEN -Soft diet VTE - SCDs, Eliquis(hgb stable at 9.3) ID - Zosyn through2/1-06/23/20 [place] - d/c-ed 2/3 Follow-up - Dr. Donne Hazel. 07/16/2020 at 2:10 PM.   Plan: Patient is going home today.  I taught the patient and daughter how to do the dressing change.  She has follow-up information in the AVS.  I also gave her a work note.  LOS: 22 days    Tammy Davies 06/28/2020 Please see Amion

## 2020-06-28 NOTE — TOC Transition Note (Signed)
Transition of Care (TOC) - CM/SW Discharge Note Marvetta Gibbons RN, BSN Transitions of Care Unit 4E- RN Case Manager See Treatment Team for direct phone #    Patient Details  Name: TORRENCE BRANAGAN MRN: [id number] Date of Birth: 04/19/1961  Transition of Care [facility] CM/SW Contact:  Dawayne Patricia, RN Phone Number: 06/28/2020, 11:49 AM   Clinical Narrative:    Pt stable for transition home today, CM unable to [place] for needed wound care/ostomy post discharge, and therefor will not seek home wound VAC either per surgical team. Plan will be to send pt home with wet-dry drsg changes- will do education here with daughter this am prior to discharge.  Pt has no DME needs, have called [facility] per pt request and they have already reached out to pt and scheduled f/u appointment.   No further TOC need at this time, daughter to transport home.    Final next level of care: [place] Barriers to Discharge: No [place] will accept this patient   Patient Goals and CMS Choice Patient states their goals for this hospitalization and ongoing recovery are:: return home CMS Medicare.gov Compare Post Acute Care list provided to:: Patient Choice offered to / list presented to : Patient  Discharge Placement               Home        Discharge Plan and Services   Discharge Planning Services: CM Consult Post Acute Care Choice: Home Health,Durable Medical Equipment          DME Arranged:  ([facility] with insurance- no able to get wound VAC) DME Agency: NA Date DME Agency Contacted: 06/25/20 Time DME Agency Contacted: 1223 Representative spoke with at DME Agency: [place]: RN [place] Agency:  (Unable to secure [facility] agency for needed services)     Representative spoke with at [place]: Multiple [place] agencies contacted  Social Determinants of Health (SDOH) Interventions     Readmission Risk Interventions No flowsheet data found.

## 2020-06-28 NOTE — Plan of Care (Signed)
°  Problem: Clinical Measurements: Goal: Will remain free from infection Outcome: Progressing Goal: Diagnostic test results will improve Outcome: Progressing   Problem: Activity: Goal: Risk for activity intolerance will decrease Outcome: Progressing

## 2020-06-28 NOTE — Progress Notes (Signed)
Discharge instructions (including medications) discussed with and copy provided to patient/caregiver 

## 2020-06-28 NOTE — Consult Note (Signed)
Tammy Davies ostomy follow up Patient receiving care in [place]. Daughter, [name], at bedside. The VAC to the abdomen had already been removed and the patient and [name] taught by the PA for the abdominal dressing by the time I entered the room at [name]. Stoma type/location: LUQ colostomy Stomal assessment/size: 1 1/4 inches, round, budded, sutures intact Peristomal assessment: some slight peristomal skin irritation. Crusted with antifungal powder and skin prep pads Treatment options for stomal/peristomal skin: barrier ring Output: thin, brown Ostomy pouching: 1pc. Flat, cut to fit Education provided: all steps of ostomy care Enrolled patient in [place] Start Discharge program: Yes Val Riles, RN, MSN, [facility], CNS-BC, pager [phone]

## 2020-06-28 NOTE — Plan of Care (Signed)
  Problem: Education: Goal: Knowledge of risk factors and measures for prevention of condition will improve Outcome: Adequate for Discharge

## 2020-07-03 ENCOUNTER — Encounter: Payer: Self-pay | Admitting: Physician Assistant

## 2020-07-03 ENCOUNTER — Ambulatory Visit (INDEPENDENT_AMBULATORY_CARE_PROVIDER_SITE_OTHER): Payer: No Typology Code available for payment source | Admitting: Physician Assistant

## 2020-07-03 ENCOUNTER — Other Ambulatory Visit: Payer: Self-pay

## 2020-07-03 VITALS — BP 120/74 | HR 121 | Ht 68.0 in | Wt 221.0 lb

## 2020-07-03 DIAGNOSIS — G4733 Obstructive sleep apnea (adult) (pediatric): Secondary | ICD-10-CM

## 2020-07-03 DIAGNOSIS — I480 Paroxysmal atrial fibrillation: Secondary | ICD-10-CM

## 2020-07-03 DIAGNOSIS — I10 Essential (primary) hypertension: Secondary | ICD-10-CM

## 2020-07-03 MED ORDER — METOPROLOL SUCCINATE ER 25 MG PO TB24: 25.0000 mg | ORAL_TABLET | Freq: Every day | ORAL | 3 refills | Status: DC

## 2020-07-03 NOTE — Progress Notes (Signed)
Cardiology Office Note:    Date:  07/03/2020   ID:  Tammy Davies, DOB [birth year], MRN [id number]  PCP:  Patient, No Pcp Per  [place] Cardiologist:  Sinclair Grooms, MD  [place] Electrophysiologist:  None   Chief Complaint: Hospital follow-up  History of Present Illness:    Tammy Davies is a 60 y.o. female with a hx of paroxysmal atrial fibrillation, hypertension, obstructive sleep apnea  presents for hospital follow-up  Longstanding history of paroxysmal atrial fibrillation and anticoagulated on Eliquis.  Patient was admitted 1/19-2/11/22.  Patient treated with 1 month history of constipation and right lower quadrant pain. Found to have sigmoid Diverticulitis with abscess. Failed medical management s/p Hartmann colectomy/colostomy.  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  Switch her to add argatroban and then Eliquis.  She was also had an incidental finding of COVID-19.  She was asymptomatic.  Past Medical History:  Diagnosis Date  . Dysrhythmia   . History of COVID-19   . PAF (paroxysmal atrial fibrillation) ([place])   . Tobacco abuse     Past Surgical History:  Procedure Laterality Date  . ABDOMINAL HYSTERECTOMY    . CHOLECYSTECTOMY N/A 08/18/2016   Procedure: LAPAROSCOPIC CHOLECYSTECTOMY WITH  INTRAOPERATIVE CHOLANGIOGRAM;  Surgeon: Johnathan Hausen, MD;  Location: [facility];  Service: General;  Laterality: N/A;  . COLECTOMY WITH COLOSTOMY CREATION/HARTMANN PROCEDURE N/A 06/18/2020   Procedure: SIGMOID COLECTOMY WITH COLOSTOMY CREATION; DRAINAGE OF PELVIC ABSCESS;  Surgeon: Rolm Bookbinder, MD;  Location: [place];  Service: General;  Laterality: N/A;  . CYSTOSCOPY WITH STENT PLACEMENT Bilateral 06/18/2020   Procedure: CYSTOSCOPY WITH STENT PLACEMENT;  Surgeon: Alexis Frock, MD;  Location: [place];  Service: Urology;  Laterality: Bilateral;  . LAPAROTOMY N/A 06/18/2020   Procedure:  EXPLORATORY LAPAROTOMY;  Surgeon: Rolm Bookbinder, MD;  Location: [place];  Service: General;  Laterality: N/A;    Current Medications: Current Meds  Medication Sig  . apixaban (ELIQUIS) 5 MG TABS tablet Take 1 tablet (5 mg total) by mouth 2 (two) times daily.  Marland Kitchen diltiazem (CARDIZEM CD) 240 MG 24 hr capsule Take 1 capsule (240 mg total) by mouth daily.  . metoprolol succinate (TOPROL XL) 25 MG 24 hr tablet Take 1 tablet (25 mg total) by mouth daily.  Marland Kitchen oxyCODONE (OXY IR/ROXICODONE) 5 MG immediate release tablet Take 1 tablet (5 mg total) by mouth every 6 (six) hours as needed for breakthrough pain.     Allergies:   Heparin   Social History   Socioeconomic History  . Marital status: Single    Spouse name: Not on file  . Number of children: Not on file  . Years of education: Not on file  . Highest education level: Not on file  Occupational History  . Not on file  Tobacco Use  . Smoking status: Current Every Day Smoker    Types: Cigarettes  . Smokeless tobacco: Never Used  Vaping Use  . Vaping Use: Never used  Substance and Sexual Activity  . Alcohol use: No  . Drug use: Yes    Types: Marijuana  . Sexual activity: Never  Other Topics Concern  . Not on file  Social History Narrative  . Not on file   Social Determinants of Health   Financial Resource Strain: Not on file  Food Insecurity: Not on file  Transportation Needs: Not on file  Physical Activity: Not on file  Stress: Not on file  Social Connections:  Not on file     Family History: The patient's family history includes Breast cancer in her mother; Diabetes in her father; Heart failure in her father and mother; Microcephaly in her father.    ROS:   Please see the history of present illness.    All other systems reviewed and are negative.  EKGs/Labs/Other Studies Reviewed:    The following studies were reviewed today: Echo 05/2017 Study Conclusions   - Left ventricle: The cavity size was normal. There was  mild  concentric hypertrophy. Systolic function was normal. The  estimated ejection fraction was in the range of 60% to 65%. Wall  motion was normal; there were no regional wall motion  abnormalities. There was no evidence of elevated ventricular  filling pressure by Doppler parameters.  - Left atrium: The atrium was mildly dilated.   EKG:  EKG is not ordered today.  The ekg ordered today demonstrates atrial fibrillation at rate of 121 bpm  Recent Labs: 06/07/2020: TSH 5.348 06/11/2020: B Natriuretic Peptide 279.9 06/28/2020: ALT 30; BUN 11; Creatinine, Ser 0.82; Hemoglobin 9.1; Magnesium 1.8; Platelets 234; Potassium 3.8; Sodium 135  Recent Lipid Panel    Component Value Date/Time   CHOL 173 06/08/2017 0819   TRIG 124 06/22/2020 0935   HDL 40 (L) 06/08/2017 0819   CHOLHDL 4.3 06/08/2017 0819   VLDL 23 06/08/2017 0819   LDLCALC 110 (H) 06/08/2017 0819     Risk Assessment/Calculations:    CHA2DS2-VASc Score = 2  his indicates a 2.2% annual risk of stroke. The patient's score is based upon: CHF History: No HTN History: Yes Diabetes History: No Stroke History: No Vascular Disease History: No Age Score: 0 Gender Score: 1       Physical Exam:    VS:  BP 120/74   Pulse (!) 121   Ht 5' 8"  (1.727 m)   Wt 221 lb (100.2 kg)   SpO2 98%   BMI 33.60 kg/m     Wt Readings from Last 3 Encounters:  07/03/20 221 lb (100.2 kg)  06/24/20 244 lb 4.3 oz (110.8 kg)  05/09/20 245 lb (111.1 kg)     GEN: Well nourished, well developed in no acute distress HEENT: Normal NECK: No JVD; No carotid bruits LYMPHATICS: No lymphadenopathy CARDIAC: Irregular tachycardic, no murmurs, rubs, gallops RESPIRATORY:  Clear to auscultation without rales, wheezing or rhonchi  ABDOMEN: Soft colostomy bag in place  MUSCULOSKELETAL:  No edema; No deformity  SKIN: Warm and dry NEUROLOGIC:  Alert and oriented x 3 PSYCHIATRIC:  Normal affect   ASSESSMENT AND PLAN:    1. Atrial  fibrillation with rapid ventricular rate Asymptomatic with elevated heart rate.  Continue Cardizem CD at current dose.  She recently got prescription filled and does not wish to increase further.  Add Toprol-XL.  Continue Eliquis for anticoagulation.  No bleeding issue.  Follow-up in A. fib clinic for further management and plan.  2.  S/p colectomy and colostomy -Per surgery  3.  Hypertension  blood pressure stable  4. OSA - ? On CPAP Medication Adjustments/Labs and Tests Ordered: Current medicines are reviewed at length with the patient today.  Concerns regarding medicines are outlined above.  Orders Placed This Encounter  Procedures  . EKG 12-Lead   Meds ordered this encounter  Medications  . metoprolol succinate (TOPROL XL) 25 MG 24 hr tablet    Sig: Take 1 tablet (25 mg total) by mouth daily.    Dispense:  90 tablet    Refill:  3    Patient Instructions  Medication Instructions:  Your physician has recommended you make the following change in your medication:  1.  START Toprol XL 25 mg taking 1 daily   *If you need a refill on your cardiac medications before your next appointment, please call your pharmacy*   Lab Work: None ordered  If you have labs (blood work) drawn today and your tests are completely normal, you will receive your results only by: Marland Kitchen MyChart Message (if you have MyChart) OR . A paper copy in the mail If you have any lab test that is abnormal or we need to change your treatment, we will call you to review the results.   Testing/Procedures: None ordered   Follow-Up: At [facility], you and your health needs are our priority.  As part of our continuing mission to provide you with exceptional heart care, we have created designated Provider Care Teams.  These Care Teams include your primary Cardiologist (physician) and Advanced Practice Providers (APPs -  Physician Assistants and Nurse Practitioners) who all work together to provide you with the care  you need, when you need it.  We recommend signing up for the patient portal called "MyChart".  Sign up information is provided on this After Visit Summary.  MyChart is used to connect with patients for Virtual Visits (Telemedicine).  Patients are able to view lab/test results, encounter notes, upcoming appointments, etc.  Non-urgent messages can be sent to your provider as well.   To learn more about what you can do with MyChart, go to [email or web address].    Your next appointment:   7-10  day(s)  The format for your next appointment:   In Person  Provider:   You will follow up in the [facility] located at [facility] Your provider will be: Roderic Palau, NP or Clint R. Marlene Lard, PA-C   Other Instructions      Signed, Leanor Kail, [place]  07/03/2020 3:24 PM    [facility]

## 2020-07-03 NOTE — Patient Instructions (Signed)
Medication Instructions:  Your physician has recommended you make the following change in your medication:  1.  START Toprol XL 25 mg taking 1 daily   *If you need a refill on your cardiac medications before your next appointment, please call your pharmacy*   Lab Work: None ordered  If you have labs (blood work) drawn today and your tests are completely normal, you will receive your results only by: Marland Kitchen MyChart Message (if you have MyChart) OR . A paper copy in the mail If you have any lab test that is abnormal or we need to change your treatment, we will call you to review the results.   Testing/Procedures: None ordered   Follow-Up: At [facility], you and your health needs are our priority.  As part of our continuing mission to provide you with exceptional heart care, we have created designated Provider Care Teams.  These Care Teams include your primary Cardiologist (physician) and Advanced Practice Providers (APPs -  Physician Assistants and Nurse Practitioners) who all work together to provide you with the care you need, when you need it.  We recommend signing up for the patient portal called "MyChart".  Sign up information is provided on this After Visit Summary.  MyChart is used to connect with patients for Virtual Visits (Telemedicine).  Patients are able to view lab/test results, encounter notes, upcoming appointments, etc.  Non-urgent messages can be sent to your provider as well.   To learn more about what you can do with MyChart, go to [email or web address].    Your next appointment:   7-10  day(s)  The format for your next appointment:   In Person  Provider:   You will follow up in the [facility] located at [facility]. Your provider will be: Roderic Palau, NP or Clint R. Fenton, PA-C   Other Instructions

## 2020-07-15 ENCOUNTER — Encounter (HOSPITAL_COMMUNITY): Payer: Self-pay | Admitting: Nurse Practitioner

## 2020-07-15 ENCOUNTER — Other Ambulatory Visit: Payer: Self-pay

## 2020-07-15 ENCOUNTER — Ambulatory Visit (HOSPITAL_COMMUNITY)
Admission: RE | Admit: 2020-07-15 | Discharge: 2020-07-15 | Disposition: A | Discharge status: Home / Self Care | Payer: No Typology Code available for payment source | Source: Ambulatory Visit | Attending: Nurse Practitioner | Admitting: Nurse Practitioner

## 2020-07-15 VITALS — BP 132/90 | HR 125 | Ht 68.0 in | Wt 223.4 lb

## 2020-07-15 DIAGNOSIS — I10 Essential (primary) hypertension: Secondary | ICD-10-CM | POA: Insufficient documentation

## 2020-07-15 DIAGNOSIS — I4891 Unspecified atrial fibrillation: Secondary | ICD-10-CM | POA: Diagnosis present

## 2020-07-15 DIAGNOSIS — Z8616 Personal history of COVID-19: Secondary | ICD-10-CM | POA: Diagnosis not present

## 2020-07-15 DIAGNOSIS — F17210 Nicotine dependence, cigarettes, uncomplicated: Secondary | ICD-10-CM | POA: Diagnosis not present

## 2020-07-15 DIAGNOSIS — I4892 Unspecified atrial flutter: Secondary | ICD-10-CM | POA: Insufficient documentation

## 2020-07-15 DIAGNOSIS — I483 Typical atrial flutter: Secondary | ICD-10-CM | POA: Diagnosis not present

## 2020-07-15 DIAGNOSIS — Z7901 Long term (current) use of anticoagulants: Secondary | ICD-10-CM | POA: Diagnosis not present

## 2020-07-15 DIAGNOSIS — I4819 Other persistent atrial fibrillation: Secondary | ICD-10-CM | POA: Insufficient documentation

## 2020-07-15 DIAGNOSIS — I4430 Unspecified atrioventricular block: Secondary | ICD-10-CM | POA: Diagnosis not present

## 2020-07-15 DIAGNOSIS — D6869 Other thrombophilia: Secondary | ICD-10-CM | POA: Diagnosis not present

## 2020-07-15 LAB — CBC
HCT: 35.1 % — ABNORMAL LOW (ref 36.0–46.0)
Hemoglobin: 10.3 g/dL — ABNORMAL LOW (ref 12.0–15.0)
MCH: 25.4 pg — ABNORMAL LOW (ref 26.0–34.0)
MCHC: 29.3 g/dL — ABNORMAL LOW (ref 30.0–36.0)
MCV: 86.5 fL (ref 80.0–100.0)
Platelets: 300 10*3/uL (ref 150–400)
RBC: 4.06 MIL/uL (ref 3.87–5.11)
RDW: 16.2 % — ABNORMAL HIGH (ref 11.5–15.5)
WBC: 7 10*3/uL (ref 4.0–10.5)
nRBC: 0 % (ref 0.0–0.2)

## 2020-07-15 LAB — BASIC METABOLIC PANEL
Anion gap: 9 (ref 5–15)
BUN: 7 mg/dL (ref 6–20)
CO2: 26 mmol/L (ref 22–32)
Calcium: 9.7 mg/dL (ref 8.9–10.3)
Chloride: 106 mmol/L (ref 98–111)
Creatinine, Ser: 0.74 mg/dL (ref 0.44–1.00)
GFR, Estimated: >60 mL/min (ref >60)
Glucose, Bld: 102 mg/dL — ABNORMAL HIGH (ref 70–99)
Potassium: 4.8 mmol/L (ref 3.5–5.1)
Sodium: 141 mmol/L (ref 135–145)

## 2020-07-15 MED ORDER — METOPROLOL SUCCINATE ER 25 MG PO TB24: 25.0000 mg | ORAL_TABLET | Freq: Every day | ORAL | 3 refills | Status: DC

## 2020-07-15 MED ORDER — DILTIAZEM HCL ER COATED BEADS 240 MG PO CP24: 240.0000 mg | ORAL_CAPSULE | Freq: Every day | ORAL | 0 refills | Status: DC

## 2020-07-15 MED ORDER — DILTIAZEM HCL ER COATED BEADS 240 MG PO CP24: 240.0000 mg | ORAL_CAPSULE | Freq: Every day | ORAL | 2 refills | Status: DC

## 2020-07-15 MED ORDER — APIXABAN 5 MG PO TABS: 5.0000 mg | ORAL_TABLET | Freq: Two times a day (BID) | ORAL | 2 refills | Status: DC

## 2020-07-15 NOTE — H&P (View-Only) (Signed)
Primary Care Physician: Patient, No Pcp Per Referring Physician: Uriyah Massimo is a 60 y.o. 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  She was found to have persistent afib/flutter and was on Cardizem IV, transitioned to oral prior to d/c. She was on heparin for a while perioperatively but transitioned back to eliquis prior to d/c. She was also positive for Covid on admission but asymptomatic and has had all vaccines.   She was seen by Robbie Lis, PA, and still had RVR in the 120's and he added low dose BB to her daily Cardizem. However, pt misunderstood and stopped her CCB, still in atrial flutter(typical) today at 127 bpm. She states that she does not feel her heart rhythm and is tolerating this well. She expects to have a reverse colostomy in 6 months.    Today, she denies symptoms of palpitations, chest pain, shortness of breath, orthopnea, PND, lower extremity edema, dizziness, presyncope, syncope, or neurologic sequela. The patient is tolerating medications without difficulties and is otherwise without complaint today.   Past Medical History:  Diagnosis Date  . Dysrhythmia   . History of COVID-19   . PAF (paroxysmal atrial fibrillation) ([place])   . Tobacco abuse    Past Surgical History:  Procedure Laterality Date  . ABDOMINAL HYSTERECTOMY    . CHOLECYSTECTOMY N/A 08/18/2016   Procedure: LAPAROSCOPIC CHOLECYSTECTOMY WITH  INTRAOPERATIVE CHOLANGIOGRAM;  Surgeon: Johnathan Hausen, MD;  [facility];  Service: General;  Laterality: N/A;  . COLECTOMY WITH COLOSTOMY CREATION/HARTMANN PROCEDURE N/A 06/18/2020   Procedure: SIGMOID COLECTOMY WITH COLOSTOMY CREATION; DRAINAGE OF PELVIC ABSCESS;  Surgeon: Rolm Bookbinder, MD;  Location: [place];  Service: General;  Laterality: N/A;  . CYSTOSCOPY WITH STENT  PLACEMENT Bilateral 06/18/2020   Procedure: CYSTOSCOPY WITH STENT PLACEMENT;  Surgeon: Alexis Frock, MD;  Location: [place];  Service: Urology;  Laterality: Bilateral;  . LAPAROTOMY N/A 06/18/2020   Procedure: EXPLORATORY LAPAROTOMY;  Surgeon: Rolm Bookbinder, MD;  Location: [place];  Service: General;  Laterality: N/A;    Current Outpatient Medications  Medication Sig Dispense Refill  . apixaban (ELIQUIS) 5 MG TABS tablet Take 1 tablet (5 mg total) by mouth 2 (two) times daily. 60 tablet 0  . metoprolol succinate (TOPROL XL) 25 MG 24 hr tablet Take 1 tablet (25 mg total) by mouth daily. 90 tablet 3  . diltiazem (CARDIZEM CD) 240 MG 24 hr capsule Take 1 capsule (240 mg total) by mouth daily. 30 capsule 0   No current facility-administered medications for this encounter.    Allergies  Allergen Reactions  . Heparin Other (See Comments)    06/23/20 HIT AB ODT =3 very high likelihood of true HIT; 2/7 SRA pending     Social History   Socioeconomic History  . Marital status: Single    Spouse name: Not on file  . Number of children: Not on file  . Years of education: Not on file  . Highest education level: Not on file  Occupational History  . Not on file  Tobacco Use  . Smoking status: Current Every Day Smoker    Types: Cigarettes  . Smokeless tobacco: Never Used  . Tobacco comment: 1 pack every week to two weeks  Vaping Use  . Vaping Use: Never used  Substance and Sexual Activity  . Alcohol  use: No  . Drug use: Not Currently    Types: Marijuana  . Sexual activity: Never  Other Topics Concern  . Not on file  Social History Narrative  . Not on file   Social Determinants of Health   Financial Resource Strain: Not on file  Food Insecurity: Not on file  Transportation Needs: Not on file  Physical Activity: Not on file  Stress: Not on file  Social Connections: Not on file  Intimate Partner Violence: Not on file    Family History  Problem Relation Age of Onset  . Heart  failure Mother   . Breast cancer Mother   . Heart failure Father   . Diabetes Father   . Microcephaly Father     ROS- All systems are reviewed and negative except as per the HPI above  Physical Exam: Vitals:   07/15/20 0833  BP: 132/90  Pulse: (!) 125  Weight: 101.3 kg  Height: 5' 8"  (1.727 m)   Wt Readings from Last 3 Encounters:  07/15/20 101.3 kg  07/03/20 100.2 kg  06/24/20 110.8 kg    Labs: Lab Results  Component Value Date   NA 135 06/28/2020   K 3.8 06/28/2020   CL 103 06/28/2020   CO2 23 06/28/2020   GLUCOSE 91 06/28/2020   BUN 11 06/28/2020   CREATININE 0.82 06/28/2020   CALCIUM 8.9 06/28/2020   PHOS 3.5 06/20/2020   MG 1.8 06/28/2020   Lab Results  Component Value Date   INR 1.03 06/09/2017   Lab Results  Component Value Date   CHOL 173 06/08/2017   HDL 40 (L) 06/08/2017   LDLCALC 110 (H) 06/08/2017   TRIG 124 06/22/2020     GEN- The patient is well appearing, alert and oriented x 3 today.   Head- normocephalic, atraumatic Eyes-  Sclera clear, conjunctiva pink Ears- hearing intact Oropharynx- clear Neck- supple, no JVP Lymph- no cervical lymphadenopathy Lungs- Clear to ausculation bilaterally, normal work of breathing Heart- irregular rate and rhythm, no murmurs, rubs or gallops, PMI not laterally displaced GI- soft, NT, ND, + BS Extremities- no clubbing, cyanosis, or edema MS- no significant deformity or atrophy Skin- no rash or lesion Psych- euthymic mood, full affect Neuro- strength and sensation are intact  EKG-typical atrial flutter at 125 bpm, qrs int 108 ms, qtc 487 ms, previous ekg's have shown afib    Assessment and Plan: 1. Afib/flutter with RVR Persistent  since her last hospitalization late January Will plan on cardioversion, procedure  discussed with pt  She will continue toprol xl 25 mg daily and add Cardizem 240 mg daily back in, as she was not suppose to stop this  She will stop Toprol am of cardioversion  She was  asked to track her BP/HR at home  She had a positive covid test 06/05/20, so will not need to be tested prior to procedure  She has had all 3 covid vaccines   2. CHA2DS2VASc score of 2 Continue eliquis 5 mg bid  Back on since 2/11 States no missed doses since d/c   3.HTN Stable   F/u in one week after cardioversion   Butch Penny C. Trevino Wyatt, [facility] [address] [place], [place] [address] [phone]

## 2020-07-15 NOTE — Patient Instructions (Signed)
Cardioversion scheduled for Monday, March 7th  - Arrive at the [facility] and go to admitting at 730AM  - Do not eat or drink anything after midnight the night prior to your procedure.  - Take all your morning medication (except diabetic medications) with a sip of water prior to arrival.  - You will not be able to drive home after your procedure.  - Do NOT miss any doses of your blood thinner - if you should miss a dose please notify our office immediately.  - If you feel as if you go back into normal rhythm prior to scheduled cardioversion, please notify our office immediately. If your procedure is canceled in the cardioversion suite you will be charged a cancellation fee.  Resume cardizem 256m once a day in the evening.   Day of cardioversion (3/7) you will STOP metoprolol (toprol)

## 2020-07-15 NOTE — Progress Notes (Signed)
Primary Care Physician: Patient, No Pcp Per Referring Physician: Daje Stark is a 60 y.o. 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  She was found to have persistent afib/flutter and was on Cardizem IV, transitioned to oral prior to d/c. She was on heparin for a while perioperatively but transitioned back to eliquis prior to d/c. She was also positive for Covid on admission but asymptomatic and has had all vaccines.   She was seen by Robbie Lis, PA, and still had RVR in the 120's and he added low dose BB to her daily Cardizem. However, pt misunderstood and stopped her CCB, still in atrial flutter(typical) today at 127 bpm. She states that she does not feel her heart rhythm and is tolerating this well. She expects to have a reverse colostomy in 6 months.    Today, she denies symptoms of palpitations, chest pain, shortness of breath, orthopnea, PND, lower extremity edema, dizziness, presyncope, syncope, or neurologic sequela. The patient is tolerating medications without difficulties and is otherwise without complaint today.   Past Medical History:  Diagnosis Date  . Dysrhythmia   . History of COVID-19   . PAF (paroxysmal atrial fibrillation) ([place])   . Tobacco abuse    Past Surgical History:  Procedure Laterality Date  . ABDOMINAL HYSTERECTOMY    . CHOLECYSTECTOMY N/A 08/18/2016   Procedure: LAPAROSCOPIC CHOLECYSTECTOMY WITH  INTRAOPERATIVE CHOLANGIOGRAM;  Surgeon: Johnathan Hausen, MD;  Location: [facility];  Service: General;  Laterality: N/A;  . COLECTOMY WITH COLOSTOMY CREATION/HARTMANN PROCEDURE N/A 06/18/2020   Procedure: SIGMOID COLECTOMY WITH COLOSTOMY CREATION; DRAINAGE OF PELVIC ABSCESS;  Surgeon: Rolm Bookbinder, MD;  Location: [facility];  Service: General;  Laterality: N/A;  . CYSTOSCOPY WITH STENT  PLACEMENT Bilateral 06/18/2020   Procedure: CYSTOSCOPY WITH STENT PLACEMENT;  Surgeon: Alexis Frock, MD;  Location: [facility];  Service: Urology;  Laterality: Bilateral;  . LAPAROTOMY N/A 06/18/2020   Procedure: EXPLORATORY LAPAROTOMY;  Surgeon: Rolm Bookbinder, MD;  Location: [facility];  Service: General;  Laterality: N/A;    Current Outpatient Medications  Medication Sig Dispense Refill  . apixaban (ELIQUIS) 5 MG TABS tablet Take 1 tablet (5 mg total) by mouth 2 (two) times daily. 60 tablet 0  . metoprolol succinate (TOPROL XL) 25 MG 24 hr tablet Take 1 tablet (25 mg total) by mouth daily. 90 tablet 3  . diltiazem (CARDIZEM CD) 240 MG 24 hr capsule Take 1 capsule (240 mg total) by mouth daily. 30 capsule 0   No current facility-administered medications for this encounter.    Allergies  Allergen Reactions  . Heparin Other (See Comments)    06/23/20 HIT AB ODT =3 very high likelihood of true HIT; 2/7 SRA pending     Social History   Socioeconomic History  . Marital status: Single    Spouse name: Not on file  . Number of children: Not on file  . Years of education: Not on file  . Highest education level: Not on file  Occupational History  . Not on file  Tobacco Use  . Smoking status: Current Every Day Smoker    Types: Cigarettes  . Smokeless tobacco: Never Used  . Tobacco comment: 1 pack every week to two weeks  Vaping Use  . Vaping Use: Never used  Substance and Sexual Activity  . Alcohol  use: No  . Drug use: Not Currently    Types: Marijuana  . Sexual activity: Never  Other Topics Concern  . Not on file  Social History Narrative  . Not on file   Social Determinants of Health   Financial Resource Strain: Not on file  Food Insecurity: Not on file  Transportation Needs: Not on file  Physical Activity: Not on file  Stress: Not on file  Social Connections: Not on file  Intimate Partner Violence: Not on file    Family History  Problem Relation Age of Onset  . Heart  failure Mother   . Breast cancer Mother   . Heart failure Father   . Diabetes Father   . Microcephaly Father     ROS- All systems are reviewed and negative except as per the HPI above  Physical Exam: Vitals:   07/15/20 0833  BP: 132/90  Pulse: (!) 125  Weight: 101.3 kg  Height: 5' 8"  (1.727 m)   Wt Readings from Last 3 Encounters:  07/15/20 101.3 kg  07/03/20 100.2 kg  06/24/20 110.8 kg    Labs: Lab Results  Component Value Date   NA 135 06/28/2020   K 3.8 06/28/2020   CL 103 06/28/2020   CO2 23 06/28/2020   GLUCOSE 91 06/28/2020   BUN 11 06/28/2020   CREATININE 0.82 06/28/2020   CALCIUM 8.9 06/28/2020   PHOS 3.5 06/20/2020   MG 1.8 06/28/2020   Lab Results  Component Value Date   INR 1.03 06/09/2017   Lab Results  Component Value Date   CHOL 173 06/08/2017   HDL 40 (L) 06/08/2017   LDLCALC 110 (H) 06/08/2017   TRIG 124 06/22/2020     GEN- The patient is well appearing, alert and oriented x 3 today.   Head- normocephalic, atraumatic Eyes-  Sclera clear, conjunctiva pink Ears- hearing intact Oropharynx- clear Neck- supple, no JVP Lymph- no cervical lymphadenopathy Lungs- Clear to ausculation bilaterally, normal work of breathing Heart- irregular rate and rhythm, no murmurs, rubs or gallops, PMI not laterally displaced GI- soft, NT, ND, + BS Extremities- no clubbing, cyanosis, or edema MS- no significant deformity or atrophy Skin- no rash or lesion Psych- euthymic mood, full affect Neuro- strength and sensation are intact  EKG-typical atrial flutter at 125 bpm, qrs int 108 ms, qtc 487 ms, previous ekg's have shown afib    Assessment and Plan: 1. Afib/flutter with RVR Persistent  since her last hospitalization late January Will plan on cardioversion, procedure  discussed with pt  She will continue toprol xl 25 mg daily and add Cardizem 240 mg daily back in, as she was not suppose to stop this  She will stop Toprol am of cardioversion  She was  asked to track her BP/HR at home  She had a positive covid test 06/05/20, so will not need to be tested prior to procedure  She has had all 3 covid vaccines   2. CHA2DS2VASc score of 2 Continue eliquis 5 mg bid  Back on since 2/11 States no missed doses since d/c   3.HTN Stable   F/u in one week after cardioversion   Butch Penny C. Coty Student, [facility] [address] [place], [place] [address] [phone]

## 2020-07-21 NOTE — Anesthesia Preprocedure Evaluation (Addendum)
Anesthesia Evaluation  Patient identified by MRN, date of birth, ID band Patient awake    Reviewed: Allergy & Precautions, NPO status , Patient's Chart, lab work & pertinent test results, reviewed documented beta blocker date and time   History of Anesthesia Complications Negative for: history of anesthetic complications  Airway Mallampati: II  TM Distance: >3 FB Neck ROM: Full    Dental  (+) Poor Dentition, Missing,    Pulmonary Current Smoker and Patient abstained from smoking.,    Pulmonary exam normal        Cardiovascular hypertension, Pt. on medications and Pt. on home beta blockers Normal cardiovascular exam+ dysrhythmias (on Eliquis) Atrial Fibrillation   TTE 2019: mild LVH, EF 60-65%, mild LAE   Neuro/Psych negative neurological ROS  negative psych ROS   GI/Hepatic negative GI ROS, Neg liver ROS,   Endo/Other  negative endocrine ROS  Renal/GU negative Renal ROS  negative genitourinary   Musculoskeletal negative musculoskeletal ROS (+)   Abdominal   Peds  Hematology negative hematology ROS (+)   Anesthesia Other Findings Day of surgery medications reviewed with patient.  Reproductive/Obstetrics negative OB ROS                            Anesthesia Physical Anesthesia Plan  ASA: III  Anesthesia Plan: General   Post-op Pain Management:    Induction: Intravenous  PONV Risk Score and Plan: Treatment may vary due to age or medical condition and Propofol infusion  Airway Management Planned: Mask  Additional Equipment: None  Intra-op Plan:   Post-operative Plan:   Informed Consent: I have reviewed the patients History and Physical, chart, labs and discussed the procedure including the risks, benefits and alternatives for the proposed anesthesia with the patient or authorized representative who has indicated his/her understanding and acceptance.       Plan Discussed  with: CRNA  Anesthesia Plan Comments:        Anesthesia Quick Evaluation

## 2020-07-22 ENCOUNTER — Encounter (HOSPITAL_COMMUNITY): Payer: Self-pay | Admitting: Internal Medicine

## 2020-07-22 ENCOUNTER — Ambulatory Visit (HOSPITAL_COMMUNITY)
Admission: RE | Admit: 2020-07-22 | Discharge: 2020-07-22 | Disposition: A | Discharge status: Home / Self Care | Payer: No Typology Code available for payment source | Attending: Internal Medicine | Admitting: Internal Medicine

## 2020-07-22 ENCOUNTER — Ambulatory Visit (HOSPITAL_COMMUNITY): Payer: No Typology Code available for payment source | Admitting: Anesthesiology

## 2020-07-22 ENCOUNTER — Encounter (HOSPITAL_COMMUNITY)
Admission: RE | Disposition: A | Discharge status: Home / Self Care | Payer: Self-pay | Source: Home / Self Care | Attending: Internal Medicine

## 2020-07-22 ENCOUNTER — Other Ambulatory Visit: Payer: Self-pay

## 2020-07-22 DIAGNOSIS — I10 Essential (primary) hypertension: Secondary | ICD-10-CM | POA: Diagnosis not present

## 2020-07-22 DIAGNOSIS — Z79899 Other long term (current) drug therapy: Secondary | ICD-10-CM | POA: Diagnosis not present

## 2020-07-22 DIAGNOSIS — Z7901 Long term (current) use of anticoagulants: Secondary | ICD-10-CM | POA: Diagnosis not present

## 2020-07-22 DIAGNOSIS — F17210 Nicotine dependence, cigarettes, uncomplicated: Secondary | ICD-10-CM | POA: Insufficient documentation

## 2020-07-22 DIAGNOSIS — Z8616 Personal history of COVID-19: Secondary | ICD-10-CM | POA: Diagnosis not present

## 2020-07-22 DIAGNOSIS — I4892 Unspecified atrial flutter: Secondary | ICD-10-CM | POA: Diagnosis not present

## 2020-07-22 DIAGNOSIS — I4819 Other persistent atrial fibrillation: Secondary | ICD-10-CM | POA: Diagnosis not present

## 2020-07-22 DIAGNOSIS — I4891 Unspecified atrial fibrillation: Secondary | ICD-10-CM | POA: Diagnosis not present

## 2020-07-22 HISTORY — PX: CARDIOVERSION: SHX1299

## 2020-07-22 SURGERY — CARDIOVERSION
Anesthesia: General

## 2020-07-22 MED ORDER — PROPOFOL 10 MG/ML IV BOLUS
INTRAVENOUS | Status: DC | PRN
  Administered 2020-07-22: 80 mg via INTRAVENOUS
  Administered 2020-07-22: 40 mg via INTRAVENOUS
  Administered 2020-07-22: 20 mg via INTRAVENOUS

## 2020-07-22 MED ORDER — METOPROLOL SUCCINATE ER 25 MG PO TB24
25.0000 mg | ORAL_TABLET | ORAL | Status: AC
  Administered 2020-07-22: 25 mg via ORAL
  Filled 2020-07-22: qty 1

## 2020-07-22 MED ORDER — ESMOLOL HCL 100 MG/10ML IV SOLN
INTRAVENOUS | Status: DC | PRN
  Administered 2020-07-22: 30 ug via INTRAVENOUS

## 2020-07-22 MED ORDER — SODIUM CHLORIDE 0.9 % IV SOLN: INTRAVENOUS | Status: DC

## 2020-07-22 MED ORDER — PROMETHAZINE HCL 25 MG/ML IJ SOLN: 6.2500 mg | INTRAMUSCULAR | Status: DC | PRN

## 2020-07-22 MED ORDER — LIDOCAINE 2% (20 MG/ML) 5 ML SYRINGE
INTRAMUSCULAR | Status: DC | PRN
  Administered 2020-07-22: 40 mg via INTRAVENOUS
  Administered 2020-07-22: 60 mg via INTRAVENOUS

## 2020-07-22 MED ORDER — METOPROLOL TARTRATE 5 MG/5ML IV SOLN
INTRAVENOUS | Status: AC
  Filled 2020-07-22: qty 5

## 2020-07-22 NOTE — Discharge Instructions (Signed)
Electrical Cardioversion Electrical cardioversion is the delivery of a jolt of electricity to restore a normal rhythm to the heart. A rhythm that is too fast or is not regular keeps the heart from pumping well. In this procedure, sticky patches or metal paddles are placed on the chest to deliver electricity to the heart from a device. This procedure may be done in an emergency if:  There is low or no blood pressure as a result of the heart rhythm.  Normal rhythm must be restored as fast as possible to protect the brain and heart from further damage.  It may save a life. This may also be a scheduled procedure for irregular or fast heart rhythms that are not immediately life-threatening. Tell a health care provider about:  Any allergies you have.  All medicines you are taking, including vitamins, herbs, eye drops, creams, and over-the-counter medicines.  Any problems you or family members have had with anesthetic medicines.  Any blood disorders you have.  Any surgeries you have had.  Any medical conditions you have.  Whether you are pregnant or may be pregnant. What are the risks? Generally, this is a safe procedure. However, problems may occur, including:  Allergic reactions to medicines.  A blood clot that breaks free and travels to other parts of your body.  The possible return of an abnormal heart rhythm within hours or days after the procedure.  Your heart stopping (cardiac arrest). This is rare. What happens before the procedure? Medicines  Your health care provider may have you start taking: ? Blood-thinning medicines (anticoagulants) so your blood does not clot as easily. ? Medicines to help stabilize your heart rate and rhythm.  Ask your health care provider about: ? Changing or stopping your regular medicines. This is especially important if you are taking diabetes medicines or blood thinners. ? Taking medicines such as aspirin and ibuprofen. These medicines can  thin your blood. Do not take these medicines unless your health care provider tells you to take them. ? Taking over-the-counter medicines, vitamins, herbs, and supplements. General instructions  Follow instructions from your health care provider about eating or drinking restrictions.  Plan to have someone take you home from the hospital or clinic.  If you will be going home right after the procedure, plan to have someone with you for 24 hours.  Ask your health care provider what steps will be taken to help prevent infection. These may include washing your skin with a germ-killing soap. What happens during the procedure?  An IV will be inserted into one of your veins.  Sticky patches (electrodes) or metal paddles may be placed on your chest.  You will be given a medicine to help you relax (sedative).  An electrical shock will be delivered. The procedure may vary among health care providers and hospitals.   What can I expect after the procedure?  Your blood pressure, heart rate, breathing rate, and blood oxygen level will be monitored until you leave the hospital or clinic.  Your heart rhythm will be watched to make sure it does not change.  You may have some redness on the skin where the shocks were given. Follow these instructions at home:  Do not drive for 24 hours if you were given a sedative during your procedure.  Take over-the-counter and prescription medicines only as told by your health care provider.  Ask your health care provider how to check your pulse. Check it often.  Rest for 48 hours after the procedure   or as told by your health care provider.  Avoid or limit your caffeine use as told by your health care provider.  Keep all follow-up visits as told by your health care provider. This is important. Contact a health care provider if:  You feel like your heart is beating too quickly or your pulse is not regular.  You have a serious muscle cramp that does not go  away. Get help right away if:  You have discomfort in your chest.  You are dizzy or you feel faint.  You have trouble breathing or you are short of breath.  Your speech is slurred.  You have trouble moving an arm or leg on one side of your body.  Your fingers or toes turn cold or blue. Summary  Electrical cardioversion is the delivery of a jolt of electricity to restore a normal rhythm to the heart.  This procedure may be done right away in an emergency or may be a scheduled procedure if the condition is not an emergency.  Generally, this is a safe procedure.  After the procedure, check your pulse often as told by your health care provider. This information is not intended to replace advice given to you by your health care provider. Make sure you discuss any questions you have with your health care provider. Document Revised: 12/05/2018 Document Reviewed: 12/05/2018 Elsevier Patient Education  2021 Elsevier Inc.  

## 2020-07-22 NOTE — Anesthesia Procedure Notes (Signed)
Procedure Name: General with mask airway Date/Time: 07/22/2020 8:34 AM Performed by: Georgia Duff, CRNA Pre-anesthesia Checklist: Patient identified, Emergency Drugs available, Suction available and Patient being monitored Oxygen Delivery Method: Ambu bag Induction Type: IV induction Placement Confirmation: positive ETCO2 Dental Injury: Teeth and Oropharynx as per pre-operative assessment

## 2020-07-22 NOTE — Anesthesia Postprocedure Evaluation (Signed)
Anesthesia Post Note  Patient: Tammy Davies  Procedure(s) Performed: CARDIOVERSION (N/A )     Patient location during evaluation: PACU Anesthesia Type: General Level of consciousness: awake and alert and oriented Pain management: pain level controlled Vital Signs Assessment: post-procedure vital signs reviewed and stable Respiratory status: spontaneous breathing, nonlabored ventilation and respiratory function stable Cardiovascular status: blood pressure returned to baseline Postop Assessment: no apparent nausea or vomiting Anesthetic complications: no   No complications documented.  Last Vitals:  Vitals:   07/22/20 0744  BP: 126/85  Pulse: (!) 116  Resp: (!) 9  Temp: (!) 36.4 C  SpO2: 99%    Last Pain:  Vitals:   07/22/20 0744  TempSrc: Oral  PainSc: 0-No pain                 Brennan Bailey

## 2020-07-22 NOTE — Transfer of Care (Signed)
Immediate Anesthesia Transfer of Care Note  Patient: Tammy Davies  Procedure(s) Performed: CARDIOVERSION (N/A )  Patient Location: Endoscopy Unit  Anesthesia Type:General  Level of Consciousness: awake and patient cooperative  Airway & Oxygen Therapy: Patient Spontanous Breathing  Post-op Assessment: Report given to RN and Post -op Vital signs reviewed and stable  Post vital signs: Reviewed  Last Vitals:  Vitals Value Taken Time  BP 101/68   Temp    Pulse 96   Resp 14   SpO2 94     Last Pain:  Vitals:   07/22/20 0744  TempSrc: Oral  PainSc: 0-No pain         Complications: No complications documented.

## 2020-07-22 NOTE — Interval H&P Note (Signed)
History and Physical Interval Note:  07/22/2020 8:19 AM  Tammy Davies  has presented today for surgery, with the diagnosis of AFIB.  The various methods of treatment have been discussed with the patient and family. After consideration of risks, benefits and other options for treatment, the patient has consented to  Procedure(s): CARDIOVERSION (N/A) as a surgical intervention.  The patient's history has been reviewed, patient examined, no change in status, stable for surgery.  I have reviewed the patient's chart and labs.  Questions were answered to the patient's satisfaction.     [name]

## 2020-07-22 NOTE — CV Procedure (Signed)
   CARDIOVERSION NOTE  Procedure: Electrical Cardioversion Indications:  Atrial Flutter  Procedure Details:  Consent: Risks of procedure as well as the alternatives and risks of each were explained to the (patient/caregiver).  Consent for procedure obtained.  Time Out: Verified patient identification, verified procedure, site/side was marked, verified correct patient position, special equipment/implants available, medications/allergies/relevent history reviewed, required imaging and test results available.  Performed  Patient placed on cardiac monitor, pulse oximetry, supplemental oxygen as necessary.  Sedation given: propofol per anesthesia Pacer pads placed anterior and posterior chest.  Cardioverted 3 time(s).  Cardioverted at 150J and 200J x 2 (biphasic) - third attempt with pressure.  Impression: Findings: Post procedure EKG shows: Atrial Fibrillation Complications: None Patient did tolerate procedure well.  Plan: 1. Cardioversion from atrial flutter with 3-4:1 AVB to afib  - then developed afib with RVR up to 160's - given esmolol per anesthesia. Cardioverted 2 additional times without success. Final rhythm was afib in the 90's.  2. The patient was given her home dose of Toprol XL 25 mg daily (takes in am) prior to d/c 3. She reports she has been asymptomatic with afib or flutter 4. Follow-up with Roderic Palau, NP in the afib clinic  Time Spent Directly with the Patient:  46 minutes   [name], MD, [facility], [place] Director of the [facility]id [facility] &  Cardiovascular Risk Reduction Clinic Diplomate of the American Board of Clinical Lipidology Attending Cardiologist  Direct Dial: [phone]  Fax: [phone]  Website:  [email or web address] Osgood Taylan Mayhan 07/22/2020, 9:03 AM

## 2020-07-22 NOTE — Addendum Note (Signed)
Addendum  created 07/22/20 0930 by Georgia Duff, CRNA   Charge Capture section accepted

## 2020-07-24 ENCOUNTER — Encounter (HOSPITAL_COMMUNITY): Payer: Self-pay | Admitting: Internal Medicine

## 2020-07-29 ENCOUNTER — Encounter (HOSPITAL_COMMUNITY): Payer: Self-pay | Admitting: Nurse Practitioner

## 2020-07-29 ENCOUNTER — Other Ambulatory Visit: Payer: Self-pay

## 2020-07-29 ENCOUNTER — Ambulatory Visit (HOSPITAL_COMMUNITY)
Admission: RE | Admit: 2020-07-29 | Discharge: 2020-07-29 | Disposition: A | Discharge status: Home / Self Care | Payer: No Typology Code available for payment source | Source: Ambulatory Visit | Attending: Nurse Practitioner | Admitting: Nurse Practitioner

## 2020-07-29 VITALS — BP 102/80 | HR 85 | Ht 68.0 in | Wt 218.2 lb

## 2020-07-29 DIAGNOSIS — F17210 Nicotine dependence, cigarettes, uncomplicated: Secondary | ICD-10-CM | POA: Diagnosis not present

## 2020-07-29 DIAGNOSIS — D6869 Other thrombophilia: Secondary | ICD-10-CM

## 2020-07-29 DIAGNOSIS — I480 Paroxysmal atrial fibrillation: Secondary | ICD-10-CM | POA: Insufficient documentation

## 2020-07-29 DIAGNOSIS — I10 Essential (primary) hypertension: Secondary | ICD-10-CM | POA: Diagnosis not present

## 2020-07-29 DIAGNOSIS — I4892 Unspecified atrial flutter: Secondary | ICD-10-CM | POA: Insufficient documentation

## 2020-07-29 DIAGNOSIS — Z7901 Long term (current) use of anticoagulants: Secondary | ICD-10-CM | POA: Diagnosis not present

## 2020-07-29 DIAGNOSIS — I4891 Unspecified atrial fibrillation: Secondary | ICD-10-CM | POA: Diagnosis present

## 2020-07-29 NOTE — Progress Notes (Addendum)
Primary Care Physician: Pcp, No Referring Physician: Robbie Lis Cardiologist: Dr. Payton Doughty is a 60 y.o. 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  She was found to have persistent afib/flutter and was on Cardizem IV, transitioned to oral prior to d/c. She was on heparin for a while perioperatively but transitioned back to eliquis prior to d/c. She was also positive for Covid on admission but asymptomatic and has had all vaccines.   She was seen by Robbie Lis, PA, and still had RVR in the 120's and he added low dose BB to her daily Cardizem. However, pt misunderstood and stopped her CCB, still in atrial flutter(typical) today at 127 bpm. She states that she does not feel her heart rhythm and is tolerating this well. She expects to have a reverse colostomy in 6 months.   F/u in the afib clinic, 07/26/20. At time of cardioversion , she went from atrail flutter with 3-4 AVB, to afib with RVR at 160 bpm. She eas cardioverted 2 additional times but did not convert. She was given esmolol per anesthesiology and  was d/c in afib in the 90's. The ekg shows today that she remains in afib at 73 bpm.she states that the afib is not bothering her and she feels well. She is pending having her colonoscopy reversed in June. She continues on BB/CCB for heart rate control.   Today, she denies symptoms of palpitations, chest pain, shortness of breath, orthopnea, PND, lower extremity edema, dizziness, presyncope, syncope, or neurologic sequela. The patient is tolerating medications without difficulties and is otherwise without complaint today.   Past Medical History:  Diagnosis Date  . Dysrhythmia   . History of COVID-19   . PAF (paroxysmal atrial fibrillation) ([place])   . Tobacco abuse    Past Surgical History:  Procedure  Laterality Date  . ABDOMINAL HYSTERECTOMY    . CARDIOVERSION N/A 07/22/2020   Procedure: CARDIOVERSION;  Surgeon: [name], MD;  Location: [facility] ENDOSCOPY;  Service: Cardiovascular;  Laterality: N/A;  . CHOLECYSTECTOMY N/A 08/18/2016   Procedure: LAPAROSCOPIC CHOLECYSTECTOMY WITH  INTRAOPERATIVE CHOLANGIOGRAM;  Surgeon: Johnathan Hausen, MD;  Location: [facility] ORS;  Service: General;  Laterality: N/A;  . COLECTOMY WITH COLOSTOMY CREATION/HARTMANN PROCEDURE N/A 06/18/2020   Procedure: SIGMOID COLECTOMY WITH COLOSTOMY CREATION; DRAINAGE OF PELVIC ABSCESS;  Surgeon: Rolm Bookbinder, MD;  Location: [place];  Service: General;  Laterality: N/A;  . CYSTOSCOPY WITH STENT PLACEMENT Bilateral 06/18/2020   Procedure: CYSTOSCOPY WITH STENT PLACEMENT;  Surgeon: Alexis Frock, MD;  Location: [place];  Service: Urology;  Laterality: Bilateral;  . LAPAROTOMY N/A 06/18/2020   Procedure: EXPLORATORY LAPAROTOMY;  Surgeon: Rolm Bookbinder, MD;  Location: [place];  Service: General;  Laterality: N/A;    Current Outpatient Medications  Medication Sig Dispense Refill  . apixaban (ELIQUIS) 5 MG TABS tablet Take 1 tablet (5 mg total) by mouth 2 (two) times daily. 60 tablet 2  . diltiazem (CARDIZEM CD) 240 MG 24 hr capsule Take 1 capsule (240 mg total) by mouth daily. 30 capsule 2  . metoprolol succinate (TOPROL XL) 25 MG 24 hr tablet Take 1 tablet (25 mg total) by mouth daily for 7 days. 90 tablet 3   No current facility-administered medications for this encounter.    Allergies  Allergen Reactions  . Heparin Other (See  Comments)    06/23/20 HIT AB ODT =3 very high likelihood of true HIT; 2/7 SRA pending     Social History   Socioeconomic History  . Marital status: Single    Spouse name: Not on file  . Number of children: Not on file  . Years of education: Not on file  . Highest education level: Not on file  Occupational History  . Not on file  Tobacco Use  . Smoking status: Current Every Day Smoker    Types:  Cigarettes  . Smokeless tobacco: Never Used  . Tobacco comment: 1 pack every week to two weeks  Vaping Use  . Vaping Use: Never used  Substance and Sexual Activity  . Alcohol use: No  . Drug use: Not Currently    Types: Marijuana  . Sexual activity: Never  Other Topics Concern  . Not on file  Social History Narrative  . Not on file   Social Determinants of Health   Financial Resource Strain: Not on file  Food Insecurity: Not on file  Transportation Needs: Not on file  Physical Activity: Not on file  Stress: Not on file  Social Connections: Not on file  Intimate Partner Violence: Not on file    Family History  Problem Relation Age of Onset  . Heart failure Mother   . Breast cancer Mother   . Heart failure Father   . Diabetes Father   . Microcephaly Father     ROS- All systems are reviewed and negative except as per the HPI above  Physical Exam: Vitals:   07/29/20 0904  BP: 102/80  Pulse: 85  Weight: 99 kg  Height: 5' 8"  (1.727 m)   Wt Readings from Last 3 Encounters:  07/29/20 99 kg  07/22/20 101.2 kg  07/15/20 101.3 kg    Labs: Lab Results  Component Value Date   NA 141 07/15/2020   K 4.8 07/15/2020   CL 106 07/15/2020   CO2 26 07/15/2020   GLUCOSE 102 (H) 07/15/2020   BUN 7 07/15/2020   CREATININE 0.74 07/15/2020   CALCIUM 9.7 07/15/2020   PHOS 3.5 06/20/2020   MG 1.8 06/28/2020   Lab Results  Component Value Date   INR 1.03 06/09/2017   Lab Results  Component Value Date   CHOL 173 06/08/2017   HDL 40 (L) 06/08/2017   LDLCALC 110 (H) 06/08/2017   TRIG 124 06/22/2020     GEN- The patient is well appearing, alert and oriented x 3 today.   Head- normocephalic, atraumatic Eyes-  Sclera clear, conjunctiva pink Ears- hearing intact Oropharynx- clear Neck- supple, no JVP Lymph- no cervical lymphadenopathy Lungs- Clear to ausculation bilaterally, normal work of breathing Heart- irregular rate and rhythm, no murmurs, rubs or gallops, PMI  not laterally displaced GI- soft, NT, ND, + BS Extremities- no clubbing, cyanosis, or edema MS- no significant deformity or atrophy Skin- no rash or lesion Psych- euthymic mood, full affect Neuro- strength and sensation are intact  EKG- afib at 85 bpm, qrs int 112 ms, qtc 435 ms    Assessment and Plan: 1. Afib/flutter with RVR Persistent  since her last hospitalization late January On cardioversion, she went from  aflutter to afib and did not shock out  She states asymptomatic in rate controlled afib She will continue toprol xl 25 mg daily and add Cardizem 240 mg daily  I will discuss with EP best plan for her to achieve SR keeping in mind that she will need surgery again  around June time frame per pt to reverse colostomy  2. CHA2DS2VASc score of 2 Continue eliquis 5 mg bid   3.HTN Stable   4. Tobacco abuse Smoking cessation encouraged    Addendum: 07/31/20- I discussed further with pt. She is currently rate controlled and asymptomatic . I think the best chance of restoring SR is to wait until her colostomy is reversed in June and after several weeks recuperation, we cn then pursue  restoring SR. I will send a reminder  letter in late June early July to return to office to further discuss plan.   Geroge Baseman Loren Sawaya, [facility] [address] [place], [place] [address] [phone]

## 2020-07-31 NOTE — Addendum Note (Signed)
Encounter addended by: Sherran Needs, NP on: 07/31/2020 2:09 PM  Actions taken: Clinical Note Signed

## 2020-08-01 ENCOUNTER — Telehealth: Payer: Self-pay

## 2020-08-01 NOTE — Telephone Encounter (Signed)
Paperwork completed and faxed.

## 2020-08-01 NOTE — Telephone Encounter (Signed)
The pts BMSPAF application was left at the office. I have completed the MD page of the application and emailed all to Dr Darliss Ridgel nurse so she can fax to [facility] at fax number written on cover letter included or to place in nurses box in Medical Records to be faxed.

## 2020-08-06 NOTE — Telephone Encounter (Signed)
**Note De-Identified  Obfuscation** Letter received from [facility] stating that they have denied the pt for asst with Eliquis at this time. Reason: Eliquis is covered by the pts Ins plan.  The letter states that they have notified the pt of this denial as well.

## 2020-08-23 ENCOUNTER — Other Ambulatory Visit: Payer: Self-pay | Admitting: *Deleted

## 2020-08-23 MED ORDER — ELIQUIS 5 MG PO TABS: 5.0000 mg | ORAL_TABLET | Freq: Two times a day (BID) | ORAL | 1 refills | Status: DC

## 2020-08-23 NOTE — Telephone Encounter (Signed)
Prescription refill request for Eliquis received.  Indication: Afib  Last office visit:[place], 07/29/2020 Scr: 0.74, 07/15/2020 Age: 60 yo  Weight: 99 kg   Pt is on the correct dose of Eliquis per dosing criteria, prescription refill sent for Eliquis 1m BID.

## 2020-09-02 ENCOUNTER — Telehealth: Payer: Self-pay

## 2020-09-02 ENCOUNTER — Ambulatory Visit (INDEPENDENT_AMBULATORY_CARE_PROVIDER_SITE_OTHER): Payer: No Typology Code available for payment source | Admitting: Internal Medicine

## 2020-09-02 ENCOUNTER — Encounter: Payer: Self-pay | Admitting: Internal Medicine

## 2020-09-02 VITALS — BP 124/70 | HR 64 | Ht 68.0 in | Wt 226.0 lb

## 2020-09-02 DIAGNOSIS — R110 Nausea: Secondary | ICD-10-CM | POA: Diagnosis not present

## 2020-09-02 DIAGNOSIS — K5720 Diverticulitis of large intestine with perforation and abscess without bleeding: Secondary | ICD-10-CM | POA: Diagnosis not present

## 2020-09-02 DIAGNOSIS — Z7901 Long term (current) use of anticoagulants: Secondary | ICD-10-CM

## 2020-09-02 DIAGNOSIS — Z933 Colostomy status: Secondary | ICD-10-CM

## 2020-09-02 MED ORDER — ONDANSETRON HCL 8 MG PO TABS: ORAL_TABLET | ORAL | 0 refills | Status: DC

## 2020-09-02 MED ORDER — PLENVU 140 G PO SOLR: 1.0000 | Freq: Once | ORAL | 0 refills | Status: AC

## 2020-09-02 NOTE — Telephone Encounter (Signed)
[facility] Pre-operative Risk Assessment     Request for surgical clearance:     Endoscopy Procedure  What type of surgery is being performed?     Colonoscopy  When is this surgery scheduled?    11/27/2020  What type of clearance is required ?   Pharmacy  Are there any medications that need to be held prior to surgery and how long? Eliquis 2 days  Practice name and name of physician performing surgery?      [facility]  What is your office phone and fax number?      Phone- [phone]  [phone]  Anesthesia type (None, local, MAC, general) ?       MAC

## 2020-09-02 NOTE — Telephone Encounter (Signed)
Patient with diagnosis of afib on Eliquis for anticoagulation.    Procedure: colonoscopy  Date of procedure: 11/27/20  CHA2DS2-VASc Score = 2  This indicates a 2.2% annual risk of stroke. The patient's score is based upon: CHF History: No HTN History: Yes Diabetes History: No Stroke History: No Vascular Disease History: No Age Score: 0 Gender Score: 1   Pt underwent cardioversion on 07/22/20 and has since completed 4 weeks of uninterrupted anticoagulation.  CrCl >155m/min using adjusted body weight due to obesity Platelet count 300K  Per office protocol, patient can hold Eliquis for 2 days prior to procedure as requested.

## 2020-09-02 NOTE — Patient Instructions (Signed)
You have been scheduled for a colonoscopy. Please follow written instructions given to you at your visit today.  Please pick up your prep supplies at the pharmacy within the next 1-3 days. If you use inhalers (even only as needed), please bring them with you on the day of your procedure.  Purchase 2 Fleet enemas over the counter

## 2020-09-02 NOTE — Telephone Encounter (Signed)
Clinical pharmacist to review Eliquis.

## 2020-09-02 NOTE — Progress Notes (Signed)
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  She is sent today by general surgery, Dr. Donne Hazel, requesting colonoscopy.  Patient was hospitalized in January 80HO 1224 with complicated acute diverticulitis with abscess for which she underwent exploratory laparotomy with left colectomy, end colostomy, and small bowel resection June 18, 2020.  She was eventually discharged June 28, 2020.  Overall, she is getting along with her colostomy though is excited about the prospects of having reversal.  She has surgical follow-up in late May.  Sent here regarding colonoscopy.  Patient has not had colonoscopy previously.  No family history of colon cancer.  CT scan from June 15, 2020 revealed worsening sigmoid diverticulitis.  Blood work from July 15, 2020 showed unremarkable basic metabolic panel.  CBC with hemoglobin 10.3.  He smokes.  She tells me that she was given a bowel prep for constipation and developed issues with nausea.  This concerns her regarding preparation for anticipated colonoscopy.  She has come off Eliquis in the past.  She sees Dr. Tamala Julian of cardiology.  REVIEW OF SYSTEMS:  All non-GI ROS negative as otherwise stated in the HPI except for shortness of breath  Past Medical History:  Diagnosis Date  . Dysrhythmia   . History of COVID-19   . PAF (paroxysmal atrial fibrillation) ([place])   . Tobacco abuse     Past Surgical History:  Procedure Laterality Date  . ABDOMINAL HYSTERECTOMY    . CARDIOVERSION N/A 07/22/2020   Procedure: CARDIOVERSION;  Surgeon: [name], MD;  Location[facility] ENDOSCOPY;  Service: Cardiovascular;  Laterality: N/A;  . CHOLECYSTECTOMY N/A 08/18/2016   Procedure: LAPAROSCOPIC CHOLECYSTECTOMY WITH  INTRAOPERATIVE CHOLANGIOGRAM;  Surgeon: Johnathan Hausen, MD;  Location: [facility] ORS;  Service:  General;  Laterality: N/A;  . COLECTOMY WITH COLOSTOMY CREATION/HARTMANN PROCEDURE N/A 06/18/2020   Procedure: SIGMOID COLECTOMY WITH COLOSTOMY CREATION; DRAINAGE OF PELVIC ABSCESS;  Surgeon: Rolm Bookbinder, MD;  Location: [place];  Service: General;  Laterality: N/A;  . CYSTOSCOPY WITH STENT PLACEMENT Bilateral 06/18/2020   Procedure: CYSTOSCOPY WITH STENT PLACEMENT;  Surgeon: Alexis Frock, MD;  Location: [place];  Service: Urology;  Laterality: Bilateral;  . LAPAROTOMY N/A 06/18/2020   Procedure: EXPLORATORY LAPAROTOMY;  Surgeon: Rolm Bookbinder, MD;  Location: [place];  Service: General;  Laterality: N/A;    Social History Tammy Davies  reports that she has been smoking cigarettes. She has never used smokeless tobacco. She reports current drug use. Drug: Marijuana. She reports that she does not drink alcohol.  family history includes Breast cancer in her mother; Diabetes in her father; Heart failure in her father and mother; Microcephaly in her father.  Allergies  Allergen Reactions  . Heparin Other (See Comments)    06/23/20 HIT AB ODT =3 very high likelihood of true HIT; 2/7 SRA pending        PHYSICAL EXAMINATION: Vital signs: BP 124/70   Pulse 64   Ht 5' 8"  (1.727 m)   Wt 226 lb (102.5 kg)   SpO2 99%   BMI 34.36 kg/m   Constitutional: Somewhat unhealthy appearing, no acute distress Psychiatric: alert and oriented x3, cooperative Eyes: extraocular movements intact, anicteric, conjunctiva pink Mouth: oral pharynx moist, no lesions Neck: supple no lymphadenopathy Cardiovascular: heart regular rate and rhythm, no murmur Lungs: clear to auscultation bilaterally Abdomen: soft, nontender, nondistended, no obvious ascites, no  peritoneal signs, normal bowel sounds, no organomegaly.  Colostomy in place on the left mid abdomen.  Normal stool contents in ostomy bag.  Midline incision mostly healed though small area in the midportion healing by secondary intent Rectal: Deferred  until colonoscopy Extremities: no clubbing, cyanosis, or lower extremity edema bilaterally Skin: no lesions on visible extremities Neuro: No focal deficits.  Cranial nerves intact  ASSESSMENT:  1.  Complicated diverticulitis status post left colectomy, small bowel resection, and end colostomy.  Healing. 2.  Anticipated colostomy reversal down the road 3.  Has not had colonoscopy   PLAN:  1.  Colonoscopy via ostomy and rectum.  Patient is HIGH RISK given her comorbidities, chronic anticoagulation, and anatomy.The nature of the procedure, as well as the risks, benefits, and alternatives were carefully and thoroughly reviewed with the patient. Ample time for discussion and questions allowed. The patient understood, was satisfied, and agreed to proceed. 2.  Hold Eliquis 2 days prior to the procedure.  We will confer with Dr. Tamala Julian and see if this is acceptable 3.  Prescribe Zofran 8 mg to be taken 20 minutes before each prep Session to assist with potential problems with nausea. 4.  In addition to standard split prep, preprocedure enemas. 5.  Keep follow-up with general surgery as planned

## 2020-09-03 NOTE — Telephone Encounter (Signed)
Patient has been informed our clinical pharmacist's recommendation.

## 2020-09-05 ENCOUNTER — Telehealth: Payer: Self-pay

## 2020-09-05 NOTE — Telephone Encounter (Signed)
Spoke to patient to make sure she knew to hold her Eliquis for 2 days prior to her procedure.  Patient agreed.

## 2020-10-29 ENCOUNTER — Encounter (HOSPITAL_COMMUNITY): Payer: Self-pay

## 2020-10-29 ENCOUNTER — Emergency Department (HOSPITAL_COMMUNITY): Payer: No Typology Code available for payment source

## 2020-10-29 ENCOUNTER — Inpatient Hospital Stay (HOSPITAL_COMMUNITY)
Admission: EM | Admit: 2020-10-29 | Discharge: 2020-10-31 | DRG: 386 | Disposition: A | Discharge status: Home / Self Care | Payer: No Typology Code available for payment source | Attending: General Surgery | Admitting: General Surgery

## 2020-10-29 ENCOUNTER — Other Ambulatory Visit: Payer: Self-pay

## 2020-10-29 DIAGNOSIS — K529 Noninfective gastroenteritis and colitis, unspecified: Secondary | ICD-10-CM | POA: Diagnosis not present

## 2020-10-29 DIAGNOSIS — Z20822 Contact with and (suspected) exposure to covid-19: Secondary | ICD-10-CM | POA: Diagnosis present

## 2020-10-29 DIAGNOSIS — Z6834 Body mass index (BMI) 34.0-34.9, adult: Secondary | ICD-10-CM

## 2020-10-29 DIAGNOSIS — I480 Paroxysmal atrial fibrillation: Secondary | ICD-10-CM | POA: Diagnosis present

## 2020-10-29 DIAGNOSIS — K5000 Crohn's disease of small intestine without complications: Secondary | ICD-10-CM | POA: Diagnosis not present

## 2020-10-29 DIAGNOSIS — E669 Obesity, unspecified: Secondary | ICD-10-CM | POA: Diagnosis present

## 2020-10-29 DIAGNOSIS — R1084 Generalized abdominal pain: Secondary | ICD-10-CM | POA: Diagnosis present

## 2020-10-29 DIAGNOSIS — K5720 Diverticulitis of large intestine with perforation and abscess without bleeding: Secondary | ICD-10-CM

## 2020-10-29 DIAGNOSIS — Z833 Family history of diabetes mellitus: Secondary | ICD-10-CM

## 2020-10-29 DIAGNOSIS — Z8616 Personal history of COVID-19: Secondary | ICD-10-CM

## 2020-10-29 DIAGNOSIS — K5792 Diverticulitis of intestine, part unspecified, without perforation or abscess without bleeding: Secondary | ICD-10-CM | POA: Diagnosis present

## 2020-10-29 DIAGNOSIS — F17210 Nicotine dependence, cigarettes, uncomplicated: Secondary | ICD-10-CM | POA: Diagnosis present

## 2020-10-29 DIAGNOSIS — Z79899 Other long term (current) drug therapy: Secondary | ICD-10-CM

## 2020-10-29 DIAGNOSIS — Z9049 Acquired absence of other specified parts of digestive tract: Secondary | ICD-10-CM

## 2020-10-29 DIAGNOSIS — Z803 Family history of malignant neoplasm of breast: Secondary | ICD-10-CM

## 2020-10-29 DIAGNOSIS — Z8249 Family history of ischemic heart disease and other diseases of the circulatory system: Secondary | ICD-10-CM

## 2020-10-29 DIAGNOSIS — Z7901 Long term (current) use of anticoagulants: Secondary | ICD-10-CM

## 2020-10-29 DIAGNOSIS — Z933 Colostomy status: Secondary | ICD-10-CM

## 2020-10-29 LAB — COMPREHENSIVE METABOLIC PANEL
ALT: 16 U/L (ref 0–44)
AST: 16 U/L (ref 15–41)
Albumin: 3.7 g/dL (ref 3.5–5.0)
Alkaline Phosphatase: 116 U/L (ref 38–126)
Anion gap: 8 (ref 5–15)
BUN: 12 mg/dL (ref 6–20)
CO2: 25 mmol/L (ref 22–32)
Calcium: 9.5 mg/dL (ref 8.9–10.3)
Chloride: 105 mmol/L (ref 98–111)
Creatinine, Ser: 0.94 mg/dL (ref 0.44–1.00)
GFR, Estimated: >60 mL/min (ref >60)
Glucose, Bld: 107 mg/dL — ABNORMAL HIGH (ref 70–99)
Potassium: 4.7 mmol/L (ref 3.5–5.1)
Sodium: 138 mmol/L (ref 135–145)
Total Bilirubin: 0.5 mg/dL (ref 0.3–1.2)
Total Protein: 7 g/dL (ref 6.5–8.1)

## 2020-10-29 LAB — MAGNESIUM: Magnesium: 1.8 mg/dL (ref 1.7–2.4)

## 2020-10-29 LAB — URINALYSIS, ROUTINE W REFLEX MICROSCOPIC
Bilirubin Urine: NEGATIVE
Glucose, UA: NEGATIVE mg/dL
Ketones, ur: NEGATIVE mg/dL
Nitrite: NEGATIVE
Protein, ur: NEGATIVE mg/dL
Specific Gravity, Urine: 1.012 (ref 1.005–1.030)
pH: 5 (ref 5.0–8.0)

## 2020-10-29 LAB — CBC WITH DIFFERENTIAL/PLATELET
Abs Immature Granulocytes: 0.05 10*3/uL (ref 0.00–0.07)
Basophils Absolute: 0 10*3/uL (ref 0.0–0.1)
Basophils Relative: 0 %
Eosinophils Absolute: 0.2 10*3/uL (ref 0.0–0.5)
Eosinophils Relative: 2 %
HCT: 47.6 % — ABNORMAL HIGH (ref 36.0–46.0)
Hemoglobin: 14.5 g/dL (ref 12.0–15.0)
Immature Granulocytes: 1 %
Lymphocytes Relative: 19 %
Lymphs Abs: 2.1 10*3/uL (ref 0.7–4.0)
MCH: 25.1 pg — ABNORMAL LOW (ref 26.0–34.0)
MCHC: 30.5 g/dL (ref 30.0–36.0)
MCV: 82.4 fL (ref 80.0–100.0)
Monocytes Absolute: 0.5 10*3/uL (ref 0.1–1.0)
Monocytes Relative: 4 %
Neutro Abs: 8 10*3/uL — ABNORMAL HIGH (ref 1.7–7.7)
Neutrophils Relative %: 74 %
Platelets: 305 10*3/uL (ref 150–400)
RBC: 5.78 MIL/uL — ABNORMAL HIGH (ref 3.87–5.11)
RDW: 20.1 % — ABNORMAL HIGH (ref 11.5–15.5)
WBC: 10.9 10*3/uL — ABNORMAL HIGH (ref 4.0–10.5)
nRBC: 0 % (ref 0.0–0.2)

## 2020-10-29 LAB — LIPASE, BLOOD: Lipase: 31 U/L (ref 11–51)

## 2020-10-29 MED ORDER — ONDANSETRON 4 MG PO TBDP: 4.0000 mg | ORAL_TABLET | Freq: Four times a day (QID) | ORAL | Status: DC | PRN

## 2020-10-29 MED ORDER — IOHEXOL 300 MG/ML  SOLN
100.0000 mL | Freq: Once | INTRAMUSCULAR | Status: AC | PRN
  Administered 2020-10-29: 100 mL via INTRAVENOUS

## 2020-10-29 MED ORDER — HYDROMORPHONE HCL 1 MG/ML IJ SOLN
0.5000 mg | INTRAMUSCULAR | Status: DC | PRN
  Administered 2020-10-30 – 2020-10-31 (×3): 0.5 mg via INTRAVENOUS
  Filled 2020-10-29 (×5): qty 1

## 2020-10-29 MED ORDER — ACETAMINOPHEN 325 MG PO TABS
650.0000 mg | ORAL_TABLET | Freq: Four times a day (QID) | ORAL | Status: DC
  Administered 2020-10-29 – 2020-10-31 (×6): 650 mg via ORAL
  Filled 2020-10-29 (×7): qty 2

## 2020-10-29 MED ORDER — ONDANSETRON 4 MG PO TBDP
4.0000 mg | ORAL_TABLET | Freq: Once | ORAL | Status: AC
  Administered 2020-10-29: 10:00:00 4 mg via ORAL
  Filled 2020-10-29: qty 1

## 2020-10-29 MED ORDER — METOPROLOL SUCCINATE ER 25 MG PO TB24
25.0000 mg | ORAL_TABLET | Freq: Every day | ORAL | Status: DC
  Administered 2020-10-30 – 2020-10-31 (×2): 25 mg via ORAL
  Filled 2020-10-29 (×2): qty 1

## 2020-10-29 MED ORDER — DILTIAZEM HCL ER COATED BEADS 120 MG PO CP24
240.0000 mg | ORAL_CAPSULE | Freq: Every evening | ORAL | Status: DC
  Administered 2020-10-30: 240 mg via ORAL
  Filled 2020-10-29: qty 2
  Filled 2020-10-29 (×2): qty 1

## 2020-10-29 MED ORDER — KETOROLAC TROMETHAMINE 15 MG/ML IJ SOLN
15.0000 mg | Freq: Three times a day (TID) | INTRAMUSCULAR | Status: DC
  Administered 2020-10-29 – 2020-10-31 (×5): 15 mg via INTRAVENOUS
  Filled 2020-10-29 (×5): qty 1

## 2020-10-29 MED ORDER — ONDANSETRON HCL 4 MG/2ML IJ SOLN: 4.0000 mg | Freq: Four times a day (QID) | INTRAMUSCULAR | Status: DC | PRN

## 2020-10-29 MED ORDER — LACTATED RINGERS IV SOLN: INTRAVENOUS | Status: DC

## 2020-10-29 MED ORDER — OXYCODONE HCL 5 MG PO TABS
10.0000 mg | ORAL_TABLET | ORAL | Status: DC | PRN
Start: 2020-10-29 — End: 2020-10-31

## 2020-10-29 MED ORDER — OXYCODONE-ACETAMINOPHEN 5-325 MG PO TABS
1.0000 | ORAL_TABLET | Freq: Once | ORAL | Status: AC
  Administered 2020-10-29: 10:00:00 1 via ORAL
  Filled 2020-10-29: qty 1

## 2020-10-29 MED ORDER — PIPERACILLIN-TAZOBACTAM 3.375 G IVPB
3.3750 g | Freq: Three times a day (TID) | INTRAVENOUS | Status: DC
  Administered 2020-10-29 – 2020-10-31 (×5): 3.375 g via INTRAVENOUS
  Filled 2020-10-29 (×7): qty 50

## 2020-10-29 MED ORDER — HYDROMORPHONE HCL 1 MG/ML IJ SOLN
0.5000 mg | Freq: Once | INTRAMUSCULAR | Status: AC
  Administered 2020-10-29: 0.5 mg via INTRAVENOUS
  Filled 2020-10-29: qty 1

## 2020-10-29 MED ORDER — OXYCODONE HCL 5 MG PO TABS
5.0000 mg | ORAL_TABLET | ORAL | Status: DC | PRN
  Administered 2020-10-30 – 2020-10-31 (×4): 5 mg via ORAL
  Filled 2020-10-29 (×4): qty 1

## 2020-10-29 NOTE — Progress Notes (Signed)
I placed an IV in patient's RIGHT Antecubital space. Pt was returned to the ED waiting room with IV. RN notified.

## 2020-10-29 NOTE — ED Triage Notes (Signed)
Pt reports lower abd pain, pt has a colonoscopy d.t diverticulitis, pt reports being here several times for the same since January. Pt reports she woke this am feeling like her "A-Fib was messing up" pt experiencing palpitations but denies CP.

## 2020-10-29 NOTE — ED Provider Notes (Signed)
[place] EMERGENCY DEPARTMENT Provider Note   CSN: [id number] Arrival date & time: 10/29/20  4098     History Chief Complaint  Patient presents with   Abdominal Pain    Tammy Davies is a 60 y.o. female with a past medical history of A. fib anticoagulated with Eliquis, status post colectomy with colostomy who presents today for evaluation of abdominal pain.  She reports that over the past day she has had worsening abdominal pain in her lower abdomen.  She denies any fevers.  She is not nauseous and has not been vomiting.  She states today she has not eaten anything and has had decreased colostomy output.  She states that this feels similar to when she had diverticulitis in the past.   She also feels like she made the in rapid A. fib, she feels like her heart has been beating very fast.  She states that this had resolved by the time I evaluated her.  She reports compliance with all of her medicines including her anticoagulants, Cardizem and metoprolol.  She denies specific chest pain.  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   Hypoxia 06/18/2020   Protein-calorie malnutrition, moderate ([place]) 06/18/2020   Status post cystoscopy with ureteral stent placement 06/18/2020   Hematuria due to cystitis from perforated diverticulitis 06/18/2020   PAF (paroxysmal atrial fibrillation) ([place])    History of COVID-19    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 06/19/2020 06/06/2020   Hyperglycemia 06/08/2017   Hypertension 06/08/2017   S/P laparoscopic cholecystectomy 08/20/2016   Tobacco abuse 08/20/2016    AF (paroxysmal atrial fibrillation) ([place]) 08/20/2016   Pancreatitis 08/15/2016   Liver mass, 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     Past Surgical History:  Procedure Laterality Date   ABDOMINAL HYSTERECTOMY     CARDIOVERSION N/A 07/22/2020   Procedure: CARDIOVERSION;  Surgeon: [name], MD;  Location: [place];  Service: Cardiovascular;  Laterality: N/A;   CHOLECYSTECTOMY N/A 08/18/2016   Procedure: LAPAROSCOPIC CHOLECYSTECTOMY WITH  INTRAOPERATIVE CHOLANGIOGRAM;  Surgeon: Johnathan Hausen, MD;  Location: [facility];  Service: General;  Laterality: N/A;   COLECTOMY WITH COLOSTOMY CREATION/HARTMANN PROCEDURE N/A 06/18/2020   Procedure: SIGMOID COLECTOMY WITH COLOSTOMY CREATION; DRAINAGE OF PELVIC ABSCESS;  Surgeon: Rolm Bookbinder, MD;  Location: [place];  Service: General;  Laterality: N/A;   CYSTOSCOPY WITH STENT PLACEMENT Bilateral 06/18/2020   Procedure: CYSTOSCOPY WITH STENT PLACEMENT;  Surgeon: Alexis Frock, MD;  Location: [place];  Service: Urology;  Laterality: Bilateral;   LAPAROTOMY N/A 06/18/2020   Procedure: EXPLORATORY LAPAROTOMY;  Surgeon: Rolm Bookbinder, MD;  Location: [place];  Service: General;  Laterality: N/A;     OB History   No obstetric history on file.     Family History  Problem Relation Age of Onset   Heart failure Mother    Breast cancer Mother    Heart failure Father    Diabetes Father    Microcephaly Father    Colon cancer Neg Hx    Stomach cancer Neg Hx    Esophageal cancer Neg Hx    Pancreatic cancer  Neg Hx    Liver disease Neg Hx     Social History   Tobacco Use   Smoking status: Every Day    Pack years: 0.00    Types: Cigarettes   Smokeless tobacco: Never  Vaping Use   Vaping Use: Never used  Substance Use Topics   Alcohol use: No   Drug use: Yes    Types: Marijuana    Comment: last used in 6 months    Home Medications Prior to Admission medications   Medication Sig Start Date End Date Taking? Authorizing Provider  apixaban (ELIQUIS) 5 MG  TABS tablet Take 1 tablet (5 mg total) by mouth 2 (two) times daily. Patient taking differently: Take 5 mg by mouth 2 (two) times daily. Managed by Dr. Tamala Julian - Cardiologist 08/23/20  Yes Belva Crome, MD  diltiazem (CARDIZEM CD) 240 MG 24 hr capsule Take 1 capsule (240 mg total) by mouth daily. Patient taking differently: Take 240 mg by mouth every evening. 07/15/20 10/29/20 Yes Sherran Needs, NP  metoprolol succinate (TOPROL-XL) 25 MG 24 hr tablet Take 25 mg by mouth daily.   Yes [provider]  ondansetron (ZOFRAN) 8 MG tablet Take one tablet 20 minutes before drinking each half of the prep Patient taking differently: Take 8 mg by mouth See admin instructions. Take one tablet 20 minutes before drinking each half of the prep 09/02/20   Irene Shipper, MD  PLENVU 140 g SOLR Take 1 kit by mouth once. 09/18/20   [provider]    Allergies    Heparin  Review of Systems   Review of Systems  Constitutional:  Positive for appetite change.  HENT:  Negative for congestion.   Respiratory:  Negative for cough and shortness of breath.   Cardiovascular:  Negative for chest pain.  Gastrointestinal:  Positive for abdominal pain. Negative for diarrhea, nausea, rectal pain and vomiting.  Genitourinary:  Negative for dysuria, frequency and vaginal discharge.  Musculoskeletal:  Negative for back pain and neck pain.  Skin:  Negative for color change and rash.  Neurological:  Negative for weakness and numbness.  Psychiatric/Behavioral:  Negative for confusion.    Physical Exam Updated Vital Signs BP 109/88 (BP Location: Right Arm)   Pulse 87   Temp 98.3 F (36.8 C) (Oral)   Resp 17   SpO2 94%   Physical Exam Vitals and nursing note reviewed.  Constitutional:      General: She is not in acute distress.    Appearance: She is not diaphoretic.  HENT:     Head: Normocephalic and atraumatic.  Eyes:     General: No scleral icterus.       Right eye: No discharge.        Left eye:  No discharge.     Conjunctiva/sclera: Conjunctivae normal.  Cardiovascular:     Rate and Rhythm: Normal rate and regular rhythm.  Pulmonary:     Effort: Pulmonary effort is normal. No respiratory distress.     Breath sounds: No stridor.  Abdominal:     General: A surgical scar is present. There is no distension.     Palpations: Abdomen is soft.     Tenderness: There is abdominal tenderness in the right lower quadrant, periumbilical area, suprapubic area and left lower quadrant.     Hernia: No hernia is present.     Comments: Ostomy present, healthy appearing. Diffuse abdominal TTP  Musculoskeletal:        General: No deformity.  Cervical back: Normal range of motion.  Skin:    General: Skin is warm and dry.  Neurological:     General: No focal deficit present.     Mental Status: She is alert.     Motor: No abnormal muscle tone.  Psychiatric:        Mood and Affect: Mood normal.        Behavior: Behavior normal.    ED Results / Procedures / Treatments   Labs (all labs ordered are listed, but only abnormal results are displayed) Labs Reviewed  COMPREHENSIVE METABOLIC PANEL - Abnormal; Notable for the following components:      Result Value   Glucose, Bld 107 (*)    All other components within normal limits  CBC WITH DIFFERENTIAL/PLATELET - Abnormal; Notable for the following components:   WBC 10.9 (*)    RBC 5.78 (*)    HCT 47.6 (*)    MCH 25.1 (*)    RDW 20.1 (*)    Neutro Abs 8.0 (*)    All other components within normal limits  URINALYSIS, ROUTINE W REFLEX MICROSCOPIC - Abnormal; Notable for the following components:   APPearance HAZY (*)    Hgb urine dipstick MODERATE (*)    Leukocytes,Ua TRACE (*)    Bacteria, UA RARE (*)    All other components within normal limits  LIPASE, BLOOD  MAGNESIUM  BASIC METABOLIC PANEL  CBC    EKG EKG Interpretation  Date/Time:  Tuesday October 29 2020 09:39:13 EDT Ventricular Rate:  135 PR Interval:    QRS Duration: 130 QT  Interval:  330 QTC Calculation: 495 R Axis:   -46 Text Interpretation: Atrial fibrillation with rapid ventricular response Left axis deviation Left ventricular hypertrophy with QRS widening ( R in aVL , Cornell product ) T wave abnormality, consider lateral ischemia Abnormal ECG Confirmed by Nanda Quinton [phone]) on 10/29/2020 8:10:11 PM  Radiology CT ABDOMEN PELVIS W CONTRAST  Result Date: 10/29/2020 CLINICAL DATA:  Lower abdominal pain, history of diverticulitis with colostomy EXAM: CT ABDOMEN AND PELVIS WITH CONTRAST TECHNIQUE: Multidetector CT imaging of the abdomen and pelvis was performed using the standard protocol following bolus administration of intravenous contrast. CONTRAST:  163m OMNIPAQUE IOHEXOL 300 MG/ML  SOLN COMPARISON:  CT 06/15/2020. FINDINGS: Lower chest: No acute abnormality. 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 Prior cholecystectomy. Pancreas: Unremarkable. No pancreatic ductal dilatation or surrounding inflammatory changes. Spleen: Normal in size without focal abnormality. Adrenals/Urinary Tract: Adrenal glands are unremarkable. Kidneys are normal, without renal calculi, focal lesion, or hydronephrosis. Bladder is unremarkable. Stomach/Bowel: Stomach is within normal limits. Small duodenal diverticulum. 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 There is there is no evidence of bowel obstruction. There is inflammation in the pelvis, localized around a loop of distal ileum which demonstrates a thickened wall (coronal image 108). Some of this inflammatory change abuts the suture line of the pouch, however the residual rectosigmoid pouch appears normal with exception of 2 small visible diverticula. The appendix and cecum are normal. Vascular/Lymphatic: Scattered aorto bi-iliac calcifications. No lymphadenopathy. Reproductive: There is fluid within the right adnexa possibly  within the fallopian tube, and a small right ovarian cyst which was visible on prior CT. Other: No hernia.  Prior midline incision. Musculoskeletal: No acute or significant osseous findings. IMPRESSION: Inflammation in the pelvis, centered around a thickened loop of distal ileum, consistent with ileitis. This loop of ileum abuts the right adnexa,  which demonstrates possible fluid within the fallopian tube, and abuts the suture line of the Hartmann's pouch. The pouch appears unremarkable with exception of two visible and uncomplicated appearing diverticula. Entero-adnexal fistula or entero-pouch fistula is possible. Repeat exam with oral (water soluble) contrast would be useful to further delineate anatomy. Normal appearance of the cecum and appendix. Uncomplicated appearance of the left hemiabdomen colostomy. No evidence of bowel obstruction, free intraperitoneal gas, or intra-abdominal abscess. 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 Electronically Signed   By: Maurine Simmering   On: 10/29/2020 14:24    Procedures Procedures   Medications Ordered in ED Medications  piperacillin-tazobactam (ZOSYN) IVPB 3.375 g (has no administration in time range)  lactated ringers infusion (has no administration in time range)  acetaminophen (TYLENOL) tablet 650 mg (has no administration in time range)  ketorolac (TORADOL) 15 MG/ML injection 15 mg (has no administration in time range)  oxyCODONE (Oxy IR/ROXICODONE) immediate release tablet 10 mg (has no administration in time range)  oxyCODONE (Oxy IR/ROXICODONE) immediate release tablet 5 mg (has no administration in time range)  HYDROmorphone (DILAUDID) injection 0.5 mg (has no administration in time range)  ondansetron (ZOFRAN-ODT) disintegrating tablet 4 mg (has no administration in time range)    Or  ondansetron (ZOFRAN) injection 4 mg (has no administration in time range)  oxyCODONE-acetaminophen (PERCOCET/ROXICET) 5-325 MG per tablet 1 tablet (1 tablet Oral  Given 10/29/20 1005)  ondansetron (ZOFRAN-ODT) disintegrating tablet 4 mg (4 mg Oral Given 10/29/20 1005)  iohexol (OMNIPAQUE) 300 MG/ML solution 100 mL (100 mLs Intravenous Contrast Given 10/29/20 1351)  HYDROmorphone (DILAUDID) injection 0.5 mg (0.5 mg Intravenous Given 10/29/20 2109)    ED Course  I have reviewed the triage vital signs and the nursing notes.  Pertinent labs & imaging results that were available during my care of the patient were reviewed by me and considered in my medical decision making (see chart for details).    MDM Rules/Calculators/A&P                         Patient is a 60 year old woman who presents today for evaluation of worsening abdominal pain.  Labs are obtained and reviewed, she has a mild leukocytosis at 10.9.  CMP is unremarkable.  UA does not appear infected, lipase is not elevated. When she arrived here she was in A. fib RVR, however during her wait to be seen this appears to have resolved.  She does no longer feel like her heart is racing.  She denies any chest pain.  CT abdomen pelvis was obtained showing inflammation in the pelvis around a thickened loop of the distal ileum and concern for a possible fistula.  I spoke with general surgery Dr. Thermon Leyland who will see the patient.  After seeing the patient he placed admission orders.   The patient appears reasonably stabilized for admission considering the current resources, flow, and capabilities available in the ED at this time, and I doubt any other [facility] requiring further screening and/or treatment in the ED prior to admission assuming timely admission and bed placement.  Note: Portions of this report may have been transcribed using voice recognition software. Every effort was made to ensure accuracy; however, inadvertent computerized transcription errors may be present   Final Clinical Impression(s) / ED Diagnoses Final diagnoses:  Generalized abdominal pain  Ileitis  Colostomy in place ([place])  PAF  (paroxysmal atrial fibrillation) ([place])    Rx / DC Orders ED Discharge Orders  None        [place], [place] 10/29/20 2202    Margette Fast, MD 10/30/20 [phone]

## 2020-10-29 NOTE — H&P (Addendum)
Admitting Physician: [place]  Service: [name]  CC: Abdominal pain  Subjective   HPI: Tammy Davies is an 60 y.o. female who is here for lower abdominal pain.  On 06/18/20, Tammy Davies underwent Hartmann's procedure for diverticulitis requiring a small bowel resection due to the dense adhesions between the severely inflamed sigmoid colon and the small bowel.  This was performed by Dr. Donne Hazel.  Final pathology was consistent with diverticulitis with no evidence of malignancy or inflammatory bowel disease.  Tammy Davies has been recovering well since surgery.  Tammy Davies recently was seen by Dr. Henrene Pastor with plans for a colonoscopy on July 13th in preparation for possible colostomy takedown later this year.  Tammy Davies smokes.  Tammy Davies has never had a colonoscopy before.  Tammy Davies does not have a family history of colon cancer or inflammatory bowel disease.  Today Tammy Davies presents after developing severe lower abdominal pain - similar to Tammy Davies previous episodes of diverticulitis.  The pain is constant and focused in the right lower abdomen, but also notable in the right upper abdomen with palpation.  Tammy Davies has been eating okay and having normal bowel function.  Tammy Davies has not had any stool or drainage from the rectum in months.  Past Medical History:  Diagnosis Date   Dysrhythmia    History of COVID-19    PAF (paroxysmal atrial fibrillation) ([place])    Tobacco abuse     Past Surgical History:  Procedure Laterality Date   ABDOMINAL HYSTERECTOMY     CARDIOVERSION N/A 07/22/2020   Procedure: CARDIOVERSION;  Surgeon: [name], MD;  Location: [place] [facility];  Service: Cardiovascular;  Laterality: N/A;   CHOLECYSTECTOMY N/A 08/18/2016   Procedure: LAPAROSCOPIC CHOLECYSTECTOMY WITH  INTRAOPERATIVE CHOLANGIOGRAM;  Surgeon: Johnathan Hausen, MD;  [facility];  Service: General;  Laterality: N/A;   COLECTOMY WITH COLOSTOMY CREATION/HARTMANN PROCEDURE N/A 06/18/2020   Procedure:  SIGMOID COLECTOMY WITH COLOSTOMY CREATION; DRAINAGE OF PELVIC ABSCESS;  Surgeon: Rolm Bookbinder, MD;  Location: [place];  Service: General;  Laterality: N/A;   CYSTOSCOPY WITH STENT PLACEMENT Bilateral 06/18/2020   Procedure: CYSTOSCOPY WITH STENT PLACEMENT;  Surgeon: Alexis Frock, MD;  Location: [place];  Service: Urology;  Laterality: Bilateral;   LAPAROTOMY N/A 06/18/2020   Procedure: EXPLORATORY LAPAROTOMY;  Surgeon: Rolm Bookbinder, MD;  Location: [place];  Service: General;  Laterality: N/A;    Family History  Problem Relation Age of Onset   Heart failure Mother    Breast cancer Mother    Heart failure Father    Diabetes Father    Microcephaly Father    Colon cancer Neg Hx    Stomach cancer Neg Hx    Esophageal cancer Neg Hx    Pancreatic cancer Neg Hx    Liver disease Neg Hx     Social:  reports that Tammy Davies has been smoking cigarettes. Tammy Davies has never used smokeless tobacco. Tammy Davies reports current drug use. Drug: Marijuana. Tammy Davies reports that Tammy Davies does not drink alcohol.  Allergies:  Allergies  Allergen Reactions   Heparin Other (See Comments)    06/23/20 HIT AB ODT =3 very high likelihood of true HIT; 2/7 SRA pending     Medications: Current Outpatient Medications  Medication Instructions   diltiazem (CARDIZEM CD) 240 mg, Oral, Daily   Eliquis 5 mg, Oral, 2 times daily   metoprolol succinate (TOPROL XL) 25 mg, Oral, Daily   ondansetron (ZOFRAN) 8 MG tablet Take one tablet 20 minutes before drinking each half of  the prep    ROS - all of the below systems have been reviewed with the patient and positives are indicated with bold text General: chills, fever or night sweats Eyes: blurry vision or double vision ENT: epistaxis or sore throat Allergy/Immunology: itchy/watery eyes or nasal congestion Hematologic/Lymphatic: bleeding problems, blood clots or swollen lymph nodes Endocrine: temperature intolerance or unexpected weight changes Breast: new or changing breast lumps or nipple  discharge Resp: cough, shortness of breath, or wheezing CV: chest pain or dyspnea on exertion GI: as per HPI GU: dysuria, trouble voiding, or hematuria MSK: joint pain or joint stiffness Neuro: TIA or stroke symptoms Derm: pruritus and skin lesion changes Psych: anxiety and depression  Objective   PE Blood pressure 109/88, pulse 87, temperature 98.3 F (36.8 C), temperature source Oral, resp. rate 17, SpO2 94 %. Constitutional: NAD; conversant; no deformities Eyes: Moist conjunctiva; no lid lag; anicteric; PERRL Neck: Trachea midline; no thyromegaly Lungs: Normal respiratory effort; no tactile fremitus CV: RRR; no palpable thrills; no pitting edema GI: Abd Obese, soft, mild tenderness RLQ, ostomy pink and healthy, midline wound scarring in MSK: Normal range of motion of extremities; no clubbing/cyanosis Psychiatric: Appropriate affect; alert and oriented x3 Lymphatic: No palpable cervical or axillary lymphadenopathy  Results for orders placed or performed during the hospital encounter of 10/29/20 (from the past 24 hour(s))  Comprehensive metabolic panel     Status: Abnormal   Collection Time: 10/29/20 10:04 AM  Result Value Ref Range   Sodium 138 135 - 145 mmol/L   Potassium 4.7 3.5 - 5.1 mmol/L   Chloride 105 98 - 111 mmol/L   CO2 25 22 - 32 mmol/L   Glucose, Bld 107 (H) 70 - 99 mg/dL   BUN 12 6 - 20 mg/dL   Creatinine, Ser 0.94 0.44 - 1.00 mg/dL   Calcium 9.5 8.9 - 10.3 mg/dL   Total Protein 7.0 6.5 - 8.1 g/dL   Albumin 3.7 3.5 - 5.0 g/dL   AST 16 15 - 41 U/L   ALT 16 0 - 44 U/L   Alkaline Phosphatase 116 38 - 126 U/L   Total Bilirubin 0.5 0.3 - 1.2 mg/dL   GFR, Estimated >60 >60 mL/min   Anion gap 8 5 - 15  Lipase, blood     Status: None   Collection Time: 10/29/20 10:04 AM  Result Value Ref Range   Lipase 31 11 - 51 U/L  CBC with Differential     Status: Abnormal   Collection Time: 10/29/20 10:04 AM  Result Value Ref Range   WBC 10.9 (H) 4.0 - 10.5 K/uL   RBC  5.78 (H) 3.87 - 5.11 MIL/uL   Hemoglobin 14.5 12.0 - 15.0 g/dL   HCT 47.6 (H) 36.0 - 46.0 %   MCV 82.4 80.0 - 100.0 fL   MCH 25.1 (L) 26.0 - 34.0 pg   MCHC 30.5 30.0 - 36.0 g/dL   RDW 20.1 (H) 11.5 - 15.5 %   Platelets 305 150 - 400 K/uL   nRBC 0.0 0.0 - 0.2 %   Neutrophils Relative % 74 %   Neutro Abs 8.0 (H) 1.7 - 7.7 K/uL   Lymphocytes Relative 19 %   Lymphs Abs 2.1 0.7 - 4.0 K/uL   Monocytes Relative 4 %   Monocytes Absolute 0.5 0.1 - 1.0 K/uL   Eosinophils Relative 2 %   Eosinophils Absolute 0.2 0.0 - 0.5 K/uL   Basophils Relative 0 %   Basophils Absolute 0.0 0.0 - 0.1  K/uL   Immature Granulocytes 1 %   Abs Immature Granulocytes 0.05 0.00 - 0.07 K/uL  Magnesium     Status: None   Collection Time: 10/29/20 10:04 AM  Result Value Ref Range   Magnesium 1.8 1.7 - 2.4 mg/dL  Urinalysis, Routine w reflex microscopic Urine, Clean Catch     Status: Abnormal   Collection Time: 10/29/20  1:25 PM  Result Value Ref Range   Color, Urine YELLOW YELLOW   APPearance HAZY (A) CLEAR   Specific Gravity, Urine 1.012 1.005 - 1.030   pH 5.0 5.0 - 8.0   Glucose, UA NEGATIVE NEGATIVE mg/dL   Hgb urine dipstick MODERATE (A) NEGATIVE   Bilirubin Urine NEGATIVE NEGATIVE   Ketones, ur NEGATIVE NEGATIVE mg/dL   Protein, ur NEGATIVE NEGATIVE mg/dL   Nitrite NEGATIVE NEGATIVE   Leukocytes,Ua TRACE (A) NEGATIVE   RBC / HPF 0-5 0 - 5 RBC/hpf   WBC, UA 0-5 0 - 5 WBC/hpf   Bacteria, UA RARE (A) NONE SEEN   Squamous Epithelial / LPF 0-5 0 - 5     Imaging Orders  CT ABDOMEN PELVIS W CONTRAST    Assessment and Plan   ALAYJAH BOEHRINGER is an 60 y.o. female with abdominal pain, found on CT to have ileitis with inflammation and possible fistula tracking down toward the adnexa and hartmann's pouch.  This may be crohn's disease, though there was no evidence of crohn's on the pathology report from Tammy Davies colectomy.  This could be recurrent diverticulitis, though the ileum appears to be the focus of the  inflammation on CT.  I do not see a drainable fluid collection.  I recommend antibiotics and observation.  Tammy Davies may benefit from additional imaging in the morning, but the best diagnostic test would likely be a colonoscopy which conveniently is already scheduled for about a month from today - 11/27/20.  We will see how Tammy Davies does clinically overnight and re-assess in the morning.  Atrial fibrillation  - home meds - Initial tachycardia resolved - hold anticoagulation for now, hold on heparin/lovenox ppx - concern for HIT in the past     Felicie Morn, MD  [facility] Use [email or web address] to contact on call provider

## 2020-10-29 NOTE — ED Provider Notes (Signed)
Emergency Medicine Provider Triage Evaluation Note  Tammy Davies , a 60 y.o. female  was evaluated in triage.  Pt complains of lower abdominal pain.  Patient with history of diverticulitis and had to have a colostomy placed in January, pain feels similar to when she had diverticulitis, started yesterday and was initially warm but has become progressively worse.  She has had normal output from her colostomy.  No nausea or vomiting.  No fevers.  Reports that since this pain is got severe she started to feel palpitations and is worried that her heart has flipped into A. fib.  No chest pain or shortness of breath.  Took Tylenol for pain without improvement.  Review of Systems  Positive: Abdominal pain, palpitations Negative: Fever, vomiting, blood in stool, chest pain or shortness of breath  Physical Exam  BP (!) 145/114 (BP Location: Left Arm)   Pulse (!) 119   Temp 98.3 F (36.8 C) (Oral)   Resp (!) 22   SpO2 97%  Gen:   Awake, no distress   Resp:  Normal effort  MSK:   Moves extremities without difficulty  Other:  Tenderness across the lower abdomen, colostomy present left lower quadrant with no output currently  Medical Decision Making  Medically screening exam initiated at 9:42 AM.  Appropriate orders placed.  KAHDIJAH ERRICKSON was informed that the remainder of the evaluation will be completed by another provider, this initial triage assessment does not replace that evaluation, and the importance of remaining in the ED until their evaluation is complete.     Jacqlyn Larsen, PA-C 10/29/20 7340    Carmin Muskrat, MD 10/30/20 [phone]

## 2020-10-30 ENCOUNTER — Encounter (HOSPITAL_COMMUNITY): Payer: Self-pay

## 2020-10-30 DIAGNOSIS — I480 Paroxysmal atrial fibrillation: Secondary | ICD-10-CM | POA: Diagnosis present

## 2020-10-30 DIAGNOSIS — E669 Obesity, unspecified: Secondary | ICD-10-CM | POA: Diagnosis present

## 2020-10-30 DIAGNOSIS — K5792 Diverticulitis of intestine, part unspecified, without perforation or abscess without bleeding: Secondary | ICD-10-CM | POA: Diagnosis present

## 2020-10-30 DIAGNOSIS — Z6834 Body mass index (BMI) 34.0-34.9, adult: Secondary | ICD-10-CM | POA: Diagnosis not present

## 2020-10-30 DIAGNOSIS — Z7901 Long term (current) use of anticoagulants: Secondary | ICD-10-CM | POA: Diagnosis not present

## 2020-10-30 DIAGNOSIS — F17210 Nicotine dependence, cigarettes, uncomplicated: Secondary | ICD-10-CM | POA: Diagnosis present

## 2020-10-30 DIAGNOSIS — Z833 Family history of diabetes mellitus: Secondary | ICD-10-CM | POA: Diagnosis not present

## 2020-10-30 DIAGNOSIS — K529 Noninfective gastroenteritis and colitis, unspecified: Secondary | ICD-10-CM | POA: Diagnosis present

## 2020-10-30 DIAGNOSIS — Z20822 Contact with and (suspected) exposure to covid-19: Secondary | ICD-10-CM | POA: Diagnosis present

## 2020-10-30 DIAGNOSIS — Z8616 Personal history of COVID-19: Secondary | ICD-10-CM | POA: Diagnosis not present

## 2020-10-30 DIAGNOSIS — Z8249 Family history of ischemic heart disease and other diseases of the circulatory system: Secondary | ICD-10-CM | POA: Diagnosis not present

## 2020-10-30 DIAGNOSIS — Z79899 Other long term (current) drug therapy: Secondary | ICD-10-CM | POA: Diagnosis not present

## 2020-10-30 DIAGNOSIS — R1084 Generalized abdominal pain: Secondary | ICD-10-CM | POA: Diagnosis present

## 2020-10-30 DIAGNOSIS — Z933 Colostomy status: Secondary | ICD-10-CM | POA: Diagnosis not present

## 2020-10-30 DIAGNOSIS — Z803 Family history of malignant neoplasm of breast: Secondary | ICD-10-CM | POA: Diagnosis not present

## 2020-10-30 DIAGNOSIS — K5000 Crohn's disease of small intestine without complications: Secondary | ICD-10-CM | POA: Diagnosis present

## 2020-10-30 DIAGNOSIS — Z9049 Acquired absence of other specified parts of digestive tract: Secondary | ICD-10-CM | POA: Diagnosis not present

## 2020-10-30 LAB — BASIC METABOLIC PANEL
Anion gap: 11 (ref 5–15)
BUN: 14 mg/dL (ref 6–20)
CO2: 23 mmol/L (ref 22–32)
Calcium: 8.7 mg/dL — ABNORMAL LOW (ref 8.9–10.3)
Chloride: 105 mmol/L (ref 98–111)
Creatinine, Ser: 1.13 mg/dL — ABNORMAL HIGH (ref 0.44–1.00)
GFR, Estimated: 56 mL/min — ABNORMAL LOW (ref >60)
Glucose, Bld: 95 mg/dL (ref 70–99)
Potassium: 4.6 mmol/L (ref 3.5–5.1)
Sodium: 139 mmol/L (ref 135–145)

## 2020-10-30 LAB — CBC
HCT: 44.5 % (ref 36.0–46.0)
Hemoglobin: 13.5 g/dL (ref 12.0–15.0)
MCH: 25.1 pg — ABNORMAL LOW (ref 26.0–34.0)
MCHC: 30.3 g/dL (ref 30.0–36.0)
MCV: 82.9 fL (ref 80.0–100.0)
Platelets: 239 10*3/uL (ref 150–400)
RBC: 5.37 MIL/uL — ABNORMAL HIGH (ref 3.87–5.11)
RDW: 19.9 % — ABNORMAL HIGH (ref 11.5–15.5)
WBC: 8.5 10*3/uL (ref 4.0–10.5)
nRBC: 0 % (ref 0.0–0.2)

## 2020-10-30 LAB — RESP PANEL BY RT-PCR (FLU A&B, COVID) ARPGX2
Influenza A by PCR: NEGATIVE
Influenza B by PCR: NEGATIVE
SARS Coronavirus 2 by RT PCR: NEGATIVE

## 2020-10-30 MED ORDER — APIXABAN 5 MG PO TABS
5.0000 mg | ORAL_TABLET | Freq: Two times a day (BID) | ORAL | Status: DC
  Administered 2020-10-30 – 2020-10-31 (×2): 5 mg via ORAL
  Filled 2020-10-30 (×2): qty 1

## 2020-10-30 MED ORDER — NICOTINE 14 MG/24HR TD PT24
14.0000 mg | MEDICATED_PATCH | Freq: Every day | TRANSDERMAL | Status: DC
  Administered 2020-10-30 – 2020-10-31 (×2): 14 mg via TRANSDERMAL
  Filled 2020-10-30 (×2): qty 1

## 2020-10-30 NOTE — Progress Notes (Signed)
[facility] Progress Note     Subjective: CC:  NAEO. Tolerated CLD for breakfast. Reports acute onset abd pain waking her up at 0300 yesterday AM. Just got pain meds this morning, rates abd pain 4/10. Denies nausea, vomiting, fever, chills, diarrhea, blood in her stool.   Objective: Vital signs in last 24 hours: Temp:  [98 F (36.7 C)-98.3 F (36.8 C)] 98 F (36.7 C) (06/15 0742) Pulse Rate:  [72-119] 98 (06/15 0803) Resp:  [16-22] 17 (06/15 0803) BP: (92-145)/(51-114) 92/81 (06/15 0803) SpO2:  [92 %-97 %] 94 % (06/15 0803)    Intake/Output from previous day: 06/14 0701 - 06/15 0700 In: 50 [IV Piggyback:50] Out: -  Intake/Output this shift: No intake/output data recorded.  PE: Gen:  Alert, NAD, pleasant Card:  Regular rate and rhythm, pedal pulses 2+ BL Pulm:  Normal effort, clear to auscultation bilaterally Abd: previous laparotomy scar, colostomy in LLQ is pink and viable - ostomy pouch empty. Soft, tender to light palpation of RLQ, TTP LLQ as well, no peritonitis, non-distended, no HSM Skin: warm and dry, no rashes  Psych: A&Ox3   Lab Results:  Recent Labs    10/29/20 1004  WBC 10.9*  HGB 14.5  HCT 47.6*  PLT 305   BMET Recent Labs    10/29/20 1004  NA 138  K 4.7  CL 105  CO2 25  GLUCOSE 107*  BUN 12  CREATININE 0.94  CALCIUM 9.5   PT/INR No results for input(s): LABPROT, INR in the last 72 hours. CMP     Component Value Date/Time   NA 138 10/29/2020 1004   NA 141 07/19/2019 0840   K 4.7 10/29/2020 1004   CL 105 10/29/2020 1004   CO2 25 10/29/2020 1004   GLUCOSE 107 (H) 10/29/2020 1004   BUN 12 10/29/2020 1004   BUN 13 07/19/2019 0840   CREATININE 0.94 10/29/2020 1004   CREATININE 0.88 09/09/2016 1146   CALCIUM 9.5 10/29/2020 1004   PROT 7.0 10/29/2020 1004   ALBUMIN 3.7 10/29/2020 1004   AST 16 10/29/2020 1004   ALT 16 10/29/2020 1004   ALKPHOS 116 10/29/2020 1004   BILITOT 0.5 10/29/2020 1004   GFRNONAA >60 10/29/2020  1004   GFRNONAA 74 09/09/2016 1146   GFRAA 105 07/19/2019 0840   GFRAA 86 09/09/2016 1146   Lipase     Component Value Date/Time   LIPASE 31 10/29/2020 1004       Studies/Results: CT ABDOMEN PELVIS W CONTRAST  Result Date: 10/29/2020 CLINICAL DATA:  Lower abdominal pain, history of diverticulitis with colostomy EXAM: CT ABDOMEN AND PELVIS WITH CONTRAST TECHNIQUE: Multidetector CT imaging of the abdomen and pelvis was performed using the standard protocol following bolus administration of intravenous contrast. CONTRAST:  159m OMNIPAQUE IOHEXOL 300 MG/ML  SOLN COMPARISON:  CT 06/15/2020. FINDINGS: Lower chest: No acute abnormality. 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 Prior cholecystectomy. Pancreas: Unremarkable. No pancreatic ductal dilatation or surrounding inflammatory changes. Spleen: Normal in size without focal abnormality. Adrenals/Urinary Tract: Adrenal glands are unremarkable. Kidneys are normal, without renal calculi, focal lesion, or hydronephrosis. Bladder is unremarkable. Stomach/Bowel: Stomach is within normal limits. Small duodenal diverticulum. 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 There is there is no evidence of bowel obstruction. There is inflammation in the pelvis, localized around a loop of distal ileum which demonstrates a thickened wall (coronal image 108). Some of this inflammatory change abuts the suture  line of the pouch, however the residual rectosigmoid pouch appears normal with exception of 2 small visible diverticula. The appendix and cecum are normal. Vascular/Lymphatic: Scattered aorto bi-iliac calcifications. No lymphadenopathy. Reproductive: There is fluid within the right adnexa possibly within the fallopian tube, and a small right ovarian cyst which was visible on prior CT. Other: No hernia.  Prior midline incision. Musculoskeletal: No acute or  significant osseous findings. IMPRESSION: Inflammation in the pelvis, centered around a thickened loop of distal ileum, consistent with ileitis. This loop of ileum abuts the right adnexa, which demonstrates possible fluid within the fallopian tube, and abuts the suture line of the Hartmann's pouch. The pouch appears unremarkable with exception of two visible and uncomplicated appearing diverticula. Entero-adnexal fistula or entero-pouch fistula is possible. Repeat exam with oral (water soluble) contrast would be useful to further delineate anatomy. Normal appearance of the cecum and appendix. Uncomplicated appearance of the left hemiabdomen colostomy. No evidence of bowel obstruction, free intraperitoneal gas, or intra-abdominal abscess. 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 Electronically Signed   By: Maurine Simmering   On: 10/29/2020 14:24    Anti-infectives: Anti-infectives (From admission, onward)    Start     Dose/Rate Route Frequency Ordered Stop   10/29/20 2115  piperacillin-tazobactam (ZOSYN) IVPB 3.375 g        3.375 g 12.5 mL/hr over 240 Minutes Intravenous Every 8 hours 10/29/20 2059          Assessment/Plan Terminal ileitis, abdominal pain - CT raises concern for possible entero-adnexal fistula or entero-pouch fistula - afebrile, WBC 8.5 from 10.9 yesterday - having bowel function and is without nausea or vomiting  - patient still with significant abdominal pain on exam, recommend continuing IV abx today, advance diet to FLD and monitor pain.  - Anticipate she will be able to discharge home on PO abx later today vs tomorrow if her pain is improving. plans for a 2 week course of PO abx and outpatient colonoscopy in 4 weeks.   PMH diverticulitis s/p hartmann procedure 06/2020 Dr. Donne Hazel -surgical path w/o malignancy or chron's  A.fib on Eliquis - home meds re-ordered (cardizem, metoprolol), Eliquis held  Tobacco abuse - nicotine patch  FEN: FLD ID: Zosyn 6/24 >> VTE: SCD's.  Lovenox not ordered due to documented risk for HIT. Will discuss with pharmacy and MD. Foley: none   LOS: 0 days    Obie Dredge, [facility] Please see Amion for pager number during day hours 7:00am-4:30pm

## 2020-10-30 NOTE — ED Notes (Signed)
Breakfast Ordered 

## 2020-10-30 NOTE — ED Notes (Addendum)
Pt sleeping, arousable to entering room, denies questions or needs, updated.

## 2020-10-30 NOTE — ED Notes (Signed)
Pt alert, NAD, calm, interactive, resps e/u, clear liquid meal finished, pain improved, denies nausea, sob, dizziness, HA or other sx. Placed on hospital bed, self reclined to sleep position.

## 2020-10-31 LAB — BASIC METABOLIC PANEL
Anion gap: 9 (ref 5–15)
BUN: 15 mg/dL (ref 6–20)
CO2: 25 mmol/L (ref 22–32)
Calcium: 9 mg/dL (ref 8.9–10.3)
Chloride: 103 mmol/L (ref 98–111)
Creatinine, Ser: 1 mg/dL (ref 0.44–1.00)
GFR, Estimated: >60 mL/min (ref >60)
Glucose, Bld: 89 mg/dL (ref 70–99)
Potassium: 4.1 mmol/L (ref 3.5–5.1)
Sodium: 137 mmol/L (ref 135–145)

## 2020-10-31 LAB — CBC
HCT: 42.4 % (ref 36.0–46.0)
Hemoglobin: 13.1 g/dL (ref 12.0–15.0)
MCH: 25.1 pg — ABNORMAL LOW (ref 26.0–34.0)
MCHC: 30.9 g/dL (ref 30.0–36.0)
MCV: 81.4 fL (ref 80.0–100.0)
Platelets: 223 10*3/uL (ref 150–400)
RBC: 5.21 MIL/uL — ABNORMAL HIGH (ref 3.87–5.11)
RDW: 19.5 % — ABNORMAL HIGH (ref 11.5–15.5)
WBC: 5.7 10*3/uL (ref 4.0–10.5)
nRBC: 0 % (ref 0.0–0.2)

## 2020-10-31 MED ORDER — NICOTINE 14 MG/24HR TD PT24: 14.0000 mg | MEDICATED_PATCH | Freq: Every day | TRANSDERMAL | 0 refills | Status: DC

## 2020-10-31 MED ORDER — ACETAMINOPHEN 325 MG PO TABS: 650.0000 mg | ORAL_TABLET | Freq: Four times a day (QID) | ORAL | 0 refills | Status: AC | PRN

## 2020-10-31 MED ORDER — AMOXICILLIN-POT CLAVULANATE 875-125 MG PO TABS: 1.0000 | ORAL_TABLET | Freq: Two times a day (BID) | ORAL | 0 refills | Status: AC

## 2020-10-31 NOTE — Progress Notes (Signed)
Patient has ordered for discharge. Given discharge instructions to the patient . Iv removed. Given all belongings to the patient.

## 2020-10-31 NOTE — Progress Notes (Signed)
Progress Note     Subjective: CC: pain significantly improved. Ambulating and tolerating full liquids without nausea, emesis, abdominal pain. No stool output since PTA  Objective: Vital signs in last 24 hours: Temp:  [97.9 F (36.6 C)-98.5 F (36.9 C)] 98.3 F (36.8 C) (06/16 0424) Pulse Rate:  [54-127] 78 (06/16 0424) Resp:  [11-25] 18 (06/16 0424) BP: (92-128)/(64-94) 118/78 (06/16 0424) SpO2:  [89 %-98 %] 96 % (06/16 0424) Weight:  [102.1 kg] 102.1 kg (06/15 0821) Last BM Date: 10/29/20  Intake/Output from previous day: 06/15 0701 - 06/16 0700 In: 331.2 [I.V.:226; IV Piggyback:105.2] Out: -  Intake/Output this shift: No intake/output data recorded.  PE: General: pleasant, WD, female who is sitting up in bed in NAD HEENT: head is normocephalic, atraumatic. Mouth is pink and moist Heart: regular, rate, and rhythm.  Palpable radial pulses bilaterally Lungs: Respiratory effort nonlabored Abd: soft, ND, +BS, very mild TTP of bilateral lower quadrants. Colostomy with pink stoma. Small amount of gas in bag. Midline umbilical scar MS: all 4 extremities are symmetrical with no cyanosis, clubbing, or edema. Skin: warm and dry with no masses, lesions, or rashes Psych: A&Ox3 with an appropriate affect.    Lab Results:  Recent Labs    10/30/20 0810 10/31/20 0410  WBC 8.5 5.7  HGB 13.5 13.1  HCT 44.5 42.4  PLT 239 223   BMET Recent Labs    10/30/20 0810 10/31/20 0410  NA 139 137  K 4.6 4.1  CL 105 103  CO2 23 25  GLUCOSE 95 89  BUN 14 15  CREATININE 1.13* 1.00  CALCIUM 8.7* 9.0   PT/INR No results for input(s): LABPROT, INR in the last 72 hours. CMP     Component Value Date/Time   NA 137 10/31/2020 0410   NA 141 07/19/2019 0840   K 4.1 10/31/2020 0410   CL 103 10/31/2020 0410   CO2 25 10/31/2020 0410   GLUCOSE 89 10/31/2020 0410   BUN 15 10/31/2020 0410   BUN 13 07/19/2019 0840   CREATININE 1.00 10/31/2020 0410   CREATININE 0.88 09/09/2016 1146    CALCIUM 9.0 10/31/2020 0410   PROT 7.0 10/29/2020 1004   ALBUMIN 3.7 10/29/2020 1004   AST 16 10/29/2020 1004   ALT 16 10/29/2020 1004   ALKPHOS 116 10/29/2020 1004   BILITOT 0.5 10/29/2020 1004   GFRNONAA >60 10/31/2020 0410   GFRNONAA 74 09/09/2016 1146   GFRAA 105 07/19/2019 0840   GFRAA 86 09/09/2016 1146   Lipase     Component Value Date/Time   LIPASE 31 10/29/2020 1004       Studies/Results: CT ABDOMEN PELVIS W CONTRAST  Result Date: 10/29/2020 CLINICAL DATA:  Lower abdominal pain, history of diverticulitis with colostomy EXAM: CT ABDOMEN AND PELVIS WITH CONTRAST TECHNIQUE: Multidetector CT imaging of the abdomen and pelvis was performed using the standard protocol following bolus administration of intravenous contrast. CONTRAST:  182m OMNIPAQUE IOHEXOL 300 MG/ML  SOLN COMPARISON:  CT 06/15/2020. FINDINGS: Lower chest: No acute abnormality. 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 Prior cholecystectomy. Pancreas: Unremarkable. No pancreatic ductal dilatation or surrounding inflammatory changes. Spleen: Normal in size without focal abnormality. Adrenals/Urinary Tract: Adrenal glands are unremarkable. Kidneys are normal, without renal calculi, focal lesion, or hydronephrosis. Bladder is unremarkable. Stomach/Bowel: Stomach is within normal limits. Small duodenal diverticulum. 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 There is there is no evidence of bowel obstruction.  There is inflammation in the pelvis, localized around a loop of distal ileum which demonstrates a thickened wall (coronal image 108). Some of this inflammatory change abuts the suture line of the pouch, however the residual rectosigmoid pouch appears normal with exception of 2 small visible diverticula. The appendix and cecum are normal. Vascular/Lymphatic: Scattered aorto bi-iliac calcifications. No  lymphadenopathy. Reproductive: There is fluid within the right adnexa possibly within the fallopian tube, and a small right ovarian cyst which was visible on prior CT. Other: No hernia.  Prior midline incision. Musculoskeletal: No acute or significant osseous findings. IMPRESSION: Inflammation in the pelvis, centered around a thickened loop of distal ileum, consistent with ileitis. This loop of ileum abuts the right adnexa, which demonstrates possible fluid within the fallopian tube, and abuts the suture line of the Hartmann's pouch. The pouch appears unremarkable with exception of two visible and uncomplicated appearing diverticula. Entero-adnexal fistula or entero-pouch fistula is possible. Repeat exam with oral (water soluble) contrast would be useful to further delineate anatomy. Normal appearance of the cecum and appendix. Uncomplicated appearance of the left hemiabdomen colostomy. No evidence of bowel obstruction, free intraperitoneal gas, or intra-abdominal abscess. 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 Electronically Signed   By: Maurine Simmering   On: 10/29/2020 14:24    Anti-infectives: Anti-infectives (From admission, onward)    Start     Dose/Rate Route Frequency Ordered Stop   10/29/20 2115  piperacillin-tazobactam (ZOSYN) IVPB 3.375 g        3.375 g 12.5 mL/hr over 240 Minutes Intravenous Every 8 hours 10/29/20 2059          Assessment/Plan Terminal ileitis, abdominal pain - CT raises concern for possible entero-adnexal fistula or entero-pouch fistula - afebrile, WBC 5.7 - having bowel function and is without nausea or vomiting - patient still with significant abdominal pain on exam, recommend continuing IV abx today, advance diet to FLD and monitor pain. - no stool output in setting of pain medications - monitor at home  - discharge home on PO abx today with plans for a 2 week course of PO abx and outpatient colonoscopy in 4 weeks.     PMH diverticulitis s/p hartmann procedure  06/2020 Dr. Donne Hazel -surgical path w/o malignancy or chron's A.fib on Eliquis - home meds re-ordered (cardizem, metoprolol), resume eliquis on discharge Tobacco abuse - nicotine patch, She is very motivated to quit smoking at this time   FEN: FLD ID: Zosyn 6/14 > 6/15 VTE: SCD's. Foley: none    LOS: 1 day    [facility] 10/31/2020, 7:54 AM Please see Amion for pager number during day hours 7:00am-4:30pm

## 2020-11-06 NOTE — Discharge Summary (Signed)
[facility] Discharge Summary   Tammy Davies ID: Tammy Davies MRN: [id number] DOB/AGE: 08-07-1960 60 y.o.  Admit date: 10/29/2020 Discharge date: 10/31/2020  Admitting Diagnosis: Terminal ileitis abdominal pain Atrial fibrillation  Discharge Diagnosis Terminal ileitis abdominal pain Atrial fibrillation  Consultants None   Imaging: No results found.    Hospital Course:  Tammy Davies is an 60 y.o. female who presented to [facility] ED for lower abdominal pain.   On 06/18/20, Tammy Davies underwent Hartmann's procedure for diverticulitis requiring a small bowel resection due to the dense adhesions between the severely inflamed sigmoid colon and the small bowel.  This was performed by Dr. Donne Hazel.  Final pathology was consistent with diverticulitis with no evidence of malignancy or inflammatory bowel disease.  Tammy Davies has been recovering well since surgery.  Tammy Davies recently was seen by Dr. Henrene Pastor with plans for a colonoscopy on July 13th in preparation for possible colostomy takedown later this year.  Tammy Davies smokes.  Tammy Davies has never had a colonoscopy before.  Tammy Davies does not have a family history of colon cancer or inflammatory bowel disease.   Tammy Davies presented after developing severe lower abdominal pain - similar to her previous episodes of diverticulitis.   Workup showed terminal ileitis.  Tammy Davies was admitted for further monitoring and treatment.  Diet was advanced as tolerated.  On date of discharge, the Tammy Davies was voiding well, tolerating diet, ambulating well, pain well controlled, vital signs stable, and felt stable for discharge home on 2 week course of antibiotics and plans for colonoscopy in 4 weeks..  Tammy Davies will follow up in our office with Dr. Donne Hazel after Tammy Davies completes her colonoscopy.  Tammy Davies was discharged with prescriptions for nicotine patch, Augmentin. Tammy Davies can take tylenol for pain control.  I or a member of my team have reviewed this Tammy Davies in the Controlled Substance  Database.   Allergies as of 10/31/2020       Reactions   Heparin Other (See Comments)   06/23/20 HIT AB ODT =3 very high likelihood of true HIT; 2/7 SRA negative        Medication List     TAKE these medications    acetaminophen 325 MG tablet Commonly known as: TYLENOL Take 2 tablets (650 mg total) by mouth every 6 (six) hours as needed for up to 7 days for mild pain or moderate pain.   amoxicillin-clavulanate 875-125 MG tablet Commonly known as: Augmentin Take 1 tablet by mouth 2 (two) times daily for 14 days.   diltiazem 240 MG 24 hr capsule Commonly known as: CARDIZEM CD Take 1 capsule (240 mg total) by mouth daily. What changed: when to take this   Eliquis 5 MG Tabs tablet Generic drug: apixaban Take 1 tablet (5 mg total) by mouth 2 (two) times daily. What changed: additional instructions   metoprolol succinate 25 MG 24 hr tablet Commonly known as: TOPROL-XL Take 25 mg by mouth daily.   nicotine 14 mg/24hr patch Commonly known as: NICODERM CQ - dosed in mg/24 hours Place 1 patch (14 mg total) onto the skin daily.   ondansetron 8 MG tablet Commonly known as: Zofran Take one tablet 20 minutes before drinking each half of the prep What changed:  how much to take how to take this when to take this   Plenvu 140 g Solr Generic drug: PEG-KCl-NaCl-NaSulf-Na Asc-C Take 1 kit by mouth once.          Follow-up Information     Rolm Bookbinder, MD Follow up.  Specialty: General Surgery Why: follow up as scheduled. Please call with any questions or concerns Contact information: [address] [place] [address] [phone]                 Signed: Winferd Humphrey , [facility] 11/06/2020, 2:01 PM Please see Amion for pager number during day hours 7:00am-4:30pm

## 2020-11-27 ENCOUNTER — Ambulatory Visit (AMBULATORY_SURGERY_CENTER): Payer: No Typology Code available for payment source | Admitting: Internal Medicine

## 2020-11-27 ENCOUNTER — Other Ambulatory Visit: Payer: Self-pay

## 2020-11-27 ENCOUNTER — Encounter: Payer: Self-pay | Admitting: Internal Medicine

## 2020-11-27 VITALS — BP 119/82 | HR 86 | Temp 97.7°F | Resp 23 | Ht 68.0 in | Wt 226.0 lb

## 2020-11-27 DIAGNOSIS — K5720 Diverticulitis of large intestine with perforation and abscess without bleeding: Secondary | ICD-10-CM | POA: Diagnosis present

## 2020-11-27 DIAGNOSIS — D128 Benign neoplasm of rectum: Secondary | ICD-10-CM

## 2020-11-27 MED ORDER — SODIUM CHLORIDE 0.9 % IV SOLN: 500.0000 mL | Freq: Once | INTRAVENOUS | Status: DC

## 2020-11-27 NOTE — Op Note (Signed)
[employer] Patient Name: Tammy Davies Procedure Date: 11/27/2020 2:55 PM MRN: [id number] Endoscopist: Docia Chuck. Henrene Pastor , MD Age: 60 Referring MD:  Date of Birth: 01-26-1961 Gender: Female Account #: [id number] Procedure:                Colonoscopy with cold snare polypectomy x 1 Indications:              Screening for colorectal malignant neoplasm.                            Patient is status post colostomy with [name]                            pouch June 18, 8297 for complicated diverticular                            disease. Colonoscopy requested prior to                            reanastomotic surgery. Medicines:                Monitored Anesthesia Care Procedure:                Pre-Anesthesia Assessment:                           - Prior to the procedure, a History and Physical                            was performed, and patient medications and                            allergies were reviewed. The patient's tolerance of                            previous anesthesia was also reviewed. The risks                            and benefits of the procedure and the sedation                            options and risks were discussed with the patient.                            All questions were answered, and informed consent                            was obtained. Prior Anticoagulants: The patient has                            taken Eliquis (apixaban), last dose was 4 days                            prior to procedure. ASA Grade Assessment: III - A  patient with severe systemic disease. After                            reviewing the risks and benefits, the patient was                            deemed in satisfactory condition to undergo the                            procedure.                           After obtaining informed consent, the colonoscope                            was passed under direct vision. Throughout the                             procedure, the patient's blood pressure, pulse, and                            oxygen saturations were monitored continuously. The                            Olympus PCF-[id number] (#[id number]) Colonoscope was                            introduced through the anus and advanced to the the                            cecum, identified by appendiceal orifice and                            ileocecal valve. The ileocecal valve, appendiceal                            orifice, and rectum were photographed. The quality                            of the bowel preparation was excellent. The                            colonoscopy was performed without difficulty. The                            patient tolerated the procedure well. The bowel                            preparation used was SUPREP via split dose                            instruction. Scope In: 3:17:50 PM Scope Out: 3:23:05 PM Scope Withdrawal Time: 0 hours 2 minutes 17 seconds  Total Procedure Duration: 0 hours 5 minutes 15 seconds  Findings:  The initial portion of the exam was per anus. There                            was a mucous plug. The underlying mucosa was normal                            to the extent of the [facility] pouch. A 5 mm polyp                            was found in the rectum. The polyp was removed with                            a cold snare. Resection and retrieval were                            complete. Retroflexion was unremarkable save                            internal hemorrhoids.                           The next portion of the exam was carried out                            through the colostomy stoma. Scattered diverticula                            were found in the left colon and right colon.                           The exam was otherwise without abnormality. Complications:            No immediate complications. Estimated blood loss:                             None. Estimated Blood Loss:     Estimated blood loss: none. Impression:               - One 5 mm polyp in the rectum, removed with a cold                            snare. Resected and retrieved. Otherwise                            unremarkable Hartmann pouch.                           - Diverticulosis in the left colon and in the right                            colon.                           - The examination was otherwise normal . Recommendation:           -  Repeat colonoscopy in 7 years for surveillance.                           - Resume Eliquis (apixaban) today at prior dose.                           - Patient has a contact number available for                            emergencies. The signs and symptoms of potential                            delayed complications were discussed with the                            patient. Return to normal activities tomorrow.                            Written discharge instructions were provided to the                            patient.                           - Resume previous diet.                           - Continue present medications.                           - Await pathology results.                           -Return to the care of Dr. Donne Hazel regarding your                            surgery Docia Chuck. Henrene Pastor, MD 11/27/2020 3:34:18 PM This report has been signed electronically.

## 2020-11-27 NOTE — Patient Instructions (Signed)
Handouts given for polyps, diverticulosis and hemorrhoids.  Resume your Eliquis (apixaban) at prior dose today.  Await pathology results.  YOU HAD AN ENDOSCOPIC PROCEDURE TODAY AT [facility]:   Refer to the procedure report that was given to you for any specific questions about what was found during the examination.  If the procedure report does not answer your questions, please call your gastroenterologist to clarify.  If you requested that your care partner not be given the details of your procedure findings, then the procedure report has been included in a sealed envelope for you to review at your convenience later.  YOU SHOULD EXPECT: Some feelings of bloating in the abdomen. Passage of more gas than usual.  Walking can help get rid of the air that was put into your GI tract during the procedure and reduce the bloating. If you had a lower endoscopy (such as a colonoscopy or flexible sigmoidoscopy) you may notice spotting of blood in your stool or on the toilet paper. If you underwent a bowel prep for your procedure, you may not have a normal bowel movement for a few days.  Please Note:  You might notice some irritation and congestion in your nose or some drainage.  This is from the oxygen used during your procedure.  There is no need for concern and it should clear up in a day or so.  SYMPTOMS TO REPORT IMMEDIATELY:  Following lower endoscopy (colonoscopy or flexible sigmoidoscopy):  Excessive amounts of blood in the stool  Significant tenderness or worsening of abdominal pains  Swelling of the abdomen that is new, acute  Fever of 100F or higher  For urgent or emergent issues, a gastroenterologist can be reached at any hour by calling [phone]. Do not use MyChart messaging for urgent concerns.    DIET:  We do recommend a small meal at first, but then you may proceed to your regular diet.  Drink plenty of fluids but you should avoid alcoholic beverages for 24  hours.  ACTIVITY:  You should plan to take it easy for the rest of today and you should NOT DRIVE or use heavy machinery until tomorrow (because of the sedation medicines used during the test).    FOLLOW UP: Our staff will call the number listed on your records 48-72 hours following your procedure to check on you and address any questions or concerns that you may have regarding the information given to you following your procedure. If we do not reach you, we will leave a message.  We will attempt to reach you two times.  During this call, we will ask if you have developed any symptoms of COVID 19. If you develop any symptoms (ie: fever, flu-like symptoms, shortness of breath, cough etc.) before then, please call [phone].  If you test positive for Covid 19 in the 2 weeks post procedure, please call and report this information to [place].    If any biopsies were taken you will be contacted by phone or by letter within the next 1-3 weeks.  Please call us at [phone] if you have not heard about the biopsies in 3 weeks.    SIGNATURES/CONFIDENTIALITY: You and/or your care partner have signed paperwork which will be entered into your electronic medical record.  These signatures attest to the fact that that the information above on your After Visit Summary has been reviewed and is understood.  Full responsibility of the confidentiality of this discharge information lies with you and/or your  care-partner.

## 2020-11-27 NOTE — Progress Notes (Signed)
pt tolerated well. VSS. awake and to recovery. Report given to RN.  

## 2020-11-27 NOTE — Progress Notes (Signed)
Called to room to assist during endoscopic procedure.  Patient ID and intended procedure confirmed with present staff. Received instructions for my participation in the procedure from the performing physician.  

## 2020-11-29 ENCOUNTER — Telehealth: Payer: Self-pay | Admitting: *Deleted

## 2020-11-29 ENCOUNTER — Telehealth: Payer: Self-pay

## 2020-11-29 NOTE — Telephone Encounter (Signed)
  Follow up Call-  Call back number 11/27/2020  Post procedure Call Back phone  # [phone]  Permission to leave phone message Yes  Some recent data might be hidden     Patient questions:  Do you have a fever, pain , or abdominal swelling? No. Pain Score  0 *  Have you tolerated food without any problems? Yes.    Have you been able to return to your normal activities? Yes.    Do you have any questions about your discharge instructions: Diet   No. Medications  No. Follow up visit  No.  Do you have questions or concerns about your Care? No.  Actions: * If pain score is 4 or above: No action needed, pain <4. . Have you developed a fever since your procedure? no  2.   Have you had an respiratory symptoms (SOB or cough) since your procedure? no  3.   Have you tested positive for COVID 19 since your procedure no  4.   Have you had any family members/close contacts diagnosed with the COVID 19 since your procedure?  no   If yes to any of these questions please route to Joylene John, RN and Joella Prince, RN

## 2020-11-29 NOTE — Telephone Encounter (Signed)
Message left

## 2020-12-03 ENCOUNTER — Encounter: Payer: Self-pay | Admitting: Internal Medicine

## 2020-12-03 ENCOUNTER — Telehealth: Payer: Self-pay | Admitting: Internal Medicine

## 2020-12-03 NOTE — Telephone Encounter (Signed)
Patient calling requesting Colon results are sent to DR. [employer] ([place]) Telephone number [phone]

## 2020-12-05 NOTE — Telephone Encounter (Signed)
Colon and path faxed to [facility]

## 2020-12-13 ENCOUNTER — Telehealth: Payer: Self-pay | Admitting: *Deleted

## 2020-12-13 NOTE — Telephone Encounter (Signed)
   [facility] Pre-operative Risk Assessment    Patient Name: Tammy Davies  DOB: 04-Dec-1960 MRN: [id number]  HEARTCARE STAFF:  - IMPORTANT!!!!!! Under Visit Info/Reason for Call, type in Other and utilize the format Clearance MM/DD/YY or Clearance TBD. Do not use dashes or single digits. - Please review there is not already an duplicate clearance open for this procedure. - If request is for dental extraction, please clarify the # of teeth to be extracted. - If the patient is currently at the dentist's office, call Pre-Op Callback Staff (MA/nurse) to input urgent request.  - If the patient is not currently in the dentist office, please route to the Pre-Op pool.  Request for surgical clearance:  What type of surgery is being performed? EXCISION OF SOFT TISSUE MASS ON RIGHT BUTTOCK (4x2.1x2.4cm)  When is this surgery scheduled? TBD  What type of clearance is required (medical clearance vs. Pharmacy clearance to hold med vs. Both)? BOTH  Are there any medications that need to be held prior to surgery and how long?  [place] name and name of physician performing surgery? [facility]; DR. Harrell Gave WHITE  What is the office phone number? [phone]   7.   What is the office fax number? [place]: Mammie Lorenzo, LPN  8.   Anesthesia type (None, local, MAC, general) ? GENERAL   Julaine Hua 12/13/2020, 2:21 PM  _________________________________________________________________   (provider comments below)

## 2020-12-16 ENCOUNTER — Other Ambulatory Visit: Payer: Self-pay | Admitting: Surgery

## 2020-12-16 DIAGNOSIS — Z09 Encounter for follow-up examination after completed treatment for conditions other than malignant neoplasm: Secondary | ICD-10-CM

## 2020-12-16 NOTE — Telephone Encounter (Signed)
Left a message for the patient to call back and speak to the on-call preop APP of the day.

## 2020-12-16 NOTE — Telephone Encounter (Signed)
Patient with diagnosis of afib on Eliquis for anticoagulation.    Procedure: EXCISION OF SOFT TISSUE MASS ON RIGHT BUTTOCK (4x2.1x2.4cm) Date of procedure: TBD  CHA2DS2-VASc Score = 2  This indicates a 2.2% annual risk of stroke. The patient's score is based upon: CHF History: No HTN History: Yes Diabetes History: No Stroke History: No Vascular Disease History: No Age Score: 0 Gender Score: 1   CrCl 75 ml/min Platelet count 223  Per office protocol, patient can hold Eliquis for 1-2 days prior to procedure.

## 2020-12-18 NOTE — Telephone Encounter (Signed)
Left another message for the patient to call back

## 2020-12-23 NOTE — Telephone Encounter (Signed)
Left message to call back 12/23/2020 at 1320 8 PM.  Patient is call back.

## 2020-12-24 ENCOUNTER — Other Ambulatory Visit: Payer: Self-pay

## 2020-12-24 ENCOUNTER — Other Ambulatory Visit: Payer: Self-pay | Admitting: Surgery

## 2020-12-24 ENCOUNTER — Ambulatory Visit
Admission: RE | Admit: 2020-12-24 | Discharge: 2020-12-24 | Disposition: A | Discharge status: Home / Self Care | Payer: No Typology Code available for payment source | Source: Ambulatory Visit | Attending: Surgery | Admitting: Surgery

## 2020-12-24 DIAGNOSIS — Z09 Encounter for follow-up examination after completed treatment for conditions other than malignant neoplasm: Secondary | ICD-10-CM

## 2020-12-25 NOTE — Telephone Encounter (Signed)
Unable to leave message for patient to call back.  Preop team please contact requesting office and let them know several attempts have been made to contact patient for preoperative cardiac evaluation.  We will need a new phone number/contact information.  Thank you.  Jossie Ng. Stephane Niemann NP-C    12/25/2020, 1:19 PM [facility] [address] [phone] Fax [phone]

## 2020-12-25 NOTE — Telephone Encounter (Signed)
Spoke with requesting surgeons office to make them aware. They have the same contact phone number on file for the patient.

## 2020-12-26 NOTE — Telephone Encounter (Signed)
I am coming on board to cover pre-op today. Multiple attempts made to contact the patient have been unsuccessful. As below, surgeon team has been notified. Will close out case request but this can be revisited when patient returns call. Opha Mcghee PA-C

## 2020-12-31 ENCOUNTER — Other Ambulatory Visit: Payer: Self-pay

## 2020-12-31 ENCOUNTER — Ambulatory Visit (HOSPITAL_COMMUNITY)
Admission: RE | Admit: 2020-12-31 | Discharge: 2020-12-31 | Disposition: A | Discharge status: Home / Self Care | Payer: No Typology Code available for payment source | Source: Ambulatory Visit | Attending: Nurse Practitioner | Admitting: Nurse Practitioner

## 2020-12-31 DIAGNOSIS — K9400 Colostomy complication, unspecified: Secondary | ICD-10-CM

## 2020-12-31 DIAGNOSIS — K9409 Other complications of colostomy: Secondary | ICD-10-CM | POA: Diagnosis present

## 2020-12-31 NOTE — Discharge Instructions (Signed)
We have started the Mio Flip 2 piece pouch with barrier ring and belt.  Call Thursday and let me know how this is doing.  I am enrolling with [employer] for samples and support WIll update your [employer] supply order ifyou like this new bag.

## 2020-12-31 NOTE — Progress Notes (Signed)
[facility]   Reason for visit:  Colostomy pouch leaking.  Changing daily. Odor from pouch.  HPI:  Perforated diverticulum with LLQ colostomy ROS  Review of Systems  Gastrointestinal:  Positive for constipation.       Thick dry stool.  Patient states this is normal to her.   Skin: Negative.        No peristomal breakdown.   Psychiatric/Behavioral: Negative.    All other systems reviewed and are negative. Vital signs:  BP (!) 140/94 (BP Location: Right Arm)   Pulse (!) 58   Temp 98.3 F (36.8 C) (Oral)   Resp 18  Exam:  Physical Exam Abdominal:     Palpations: Abdomen is soft.     Comments: Round, obese abdomen.   Neurological:     Mental Status: She is alert.    Stoma type/location:  LLQ colostomy with midline abdominal surgical scar.  Stoma is located in rounded roll of abdominal adipose creating a rounded peristomal surface.  No hernia noted.  She is wearing a 1 piece flat pouch that is leaking daily with no filter.  Stomal assessment/size:  1" pink and moist thick brown stool noted.   Peristomal assessment:  skin intact, rounded abdomen. Treatment options for stomal/peristomal skin: barrier ring and MIo Flip 2 piece pouch. Petal design will hug around rounded abdomen for a secure fit. This pouch has a filter and will aid in gas elimination and remain odor free if sealed.  I have applied this pouch and an XXL belt.  I have demonstrated bag application and closure as this coloplast bag is different than her [employer]ister bag.  Output: Thick brown stool.  States she is not constipated.  No abdominal fullness or pain.  THis is her normal consistency of stool.  Ostomy pouching: 2pc. Coloplast Mio Flip pouch  Will enroll in [place] program for samples and will update the orders with [employer] if she likes this new system.   Education provided:  New pouch application, use and belt. Would like to increase wear time to changing 2 times weekly.     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  Plan  Call clinic and let me know if you like the new pouch and we will order with [employer].      Visit time: 60 minutes.   Domenic Moras FNP-BC

## 2021-01-02 ENCOUNTER — Encounter (HOSPITAL_COMMUNITY): Payer: Self-pay | Admitting: Nurse Practitioner

## 2021-01-02 ENCOUNTER — Telehealth (HOSPITAL_COMMUNITY): Payer: Self-pay | Admitting: Nurse Practitioner

## 2021-01-06 ENCOUNTER — Other Ambulatory Visit (HOSPITAL_COMMUNITY): Payer: Self-pay | Admitting: Nurse Practitioner

## 2021-01-06 DIAGNOSIS — K9400 Colostomy complication, unspecified: Secondary | ICD-10-CM

## 2021-02-13 ENCOUNTER — Other Ambulatory Visit (HOSPITAL_COMMUNITY): Payer: Self-pay | Admitting: Nurse Practitioner

## 2021-02-22 ENCOUNTER — Emergency Department (HOSPITAL_BASED_OUTPATIENT_CLINIC_OR_DEPARTMENT_OTHER): Payer: No Typology Code available for payment source

## 2021-02-22 ENCOUNTER — Emergency Department (HOSPITAL_BASED_OUTPATIENT_CLINIC_OR_DEPARTMENT_OTHER)
Admission: EM | Admit: 2021-02-22 | Discharge: 2021-02-22 | Disposition: A | Discharge status: Home / Self Care | Payer: No Typology Code available for payment source | Attending: Emergency Medicine | Admitting: Emergency Medicine

## 2021-02-22 ENCOUNTER — Other Ambulatory Visit: Payer: Self-pay

## 2021-02-22 DIAGNOSIS — Z20822 Contact with and (suspected) exposure to covid-19: Secondary | ICD-10-CM | POA: Diagnosis not present

## 2021-02-22 DIAGNOSIS — Z8616 Personal history of COVID-19: Secondary | ICD-10-CM | POA: Diagnosis not present

## 2021-02-22 DIAGNOSIS — F17210 Nicotine dependence, cigarettes, uncomplicated: Secondary | ICD-10-CM | POA: Insufficient documentation

## 2021-02-22 DIAGNOSIS — Z7901 Long term (current) use of anticoagulants: Secondary | ICD-10-CM | POA: Insufficient documentation

## 2021-02-22 DIAGNOSIS — I4891 Unspecified atrial fibrillation: Secondary | ICD-10-CM | POA: Diagnosis not present

## 2021-02-22 DIAGNOSIS — R0602 Shortness of breath: Secondary | ICD-10-CM | POA: Insufficient documentation

## 2021-02-22 DIAGNOSIS — Z79899 Other long term (current) drug therapy: Secondary | ICD-10-CM | POA: Diagnosis not present

## 2021-02-22 LAB — CBC WITH DIFFERENTIAL/PLATELET
Abs Immature Granulocytes: 0.03 10*3/uL (ref 0.00–0.07)
Basophils Absolute: 0 10*3/uL (ref 0.0–0.1)
Basophils Relative: 1 %
Eosinophils Absolute: 0.2 10*3/uL (ref 0.0–0.5)
Eosinophils Relative: 3 %
HCT: 46.8 % — ABNORMAL HIGH (ref 36.0–46.0)
Hemoglobin: 15.1 g/dL — ABNORMAL HIGH (ref 12.0–15.0)
Immature Granulocytes: 1 %
Lymphocytes Relative: 13 %
Lymphs Abs: 0.8 10*3/uL (ref 0.7–4.0)
MCH: 27.1 pg (ref 26.0–34.0)
MCHC: 32.3 g/dL (ref 30.0–36.0)
MCV: 84 fL (ref 80.0–100.0)
Monocytes Absolute: 0.5 10*3/uL (ref 0.1–1.0)
Monocytes Relative: 8 %
Neutro Abs: 4.7 10*3/uL (ref 1.7–7.7)
Neutrophils Relative %: 74 %
Platelets: 193 10*3/uL (ref 150–400)
RBC: 5.57 MIL/uL — ABNORMAL HIGH (ref 3.87–5.11)
RDW: 16 % — ABNORMAL HIGH (ref 11.5–15.5)
WBC: 6.2 10*3/uL (ref 4.0–10.5)
nRBC: 0 % (ref 0.0–0.2)

## 2021-02-22 LAB — COMPREHENSIVE METABOLIC PANEL
ALT: 11 U/L (ref 0–44)
AST: 18 U/L (ref 15–41)
Albumin: 4.1 g/dL (ref 3.5–5.0)
Alkaline Phosphatase: 107 U/L (ref 38–126)
Anion gap: 10 (ref 5–15)
BUN: 11 mg/dL (ref 6–20)
CO2: 20 mmol/L — ABNORMAL LOW (ref 22–32)
Calcium: 9.6 mg/dL (ref 8.9–10.3)
Chloride: 106 mmol/L (ref 98–111)
Creatinine, Ser: 0.73 mg/dL (ref 0.44–1.00)
GFR, Estimated: >60 mL/min (ref >60)
Glucose, Bld: 103 mg/dL — ABNORMAL HIGH (ref 70–99)
Potassium: 4.3 mmol/L (ref 3.5–5.1)
Sodium: 136 mmol/L (ref 135–145)
Total Bilirubin: 0.9 mg/dL (ref 0.3–1.2)
Total Protein: 7.3 g/dL (ref 6.5–8.1)

## 2021-02-22 LAB — RESP PANEL BY RT-PCR (FLU A&B, COVID) ARPGX2
Influenza A by PCR: NEGATIVE
Influenza B by PCR: NEGATIVE
SARS Coronavirus 2 by RT PCR: NEGATIVE

## 2021-02-22 LAB — TROPONIN I (HIGH SENSITIVITY): Troponin I (High Sensitivity): 12 ng/L (ref <18)

## 2021-02-22 MED ORDER — SODIUM CHLORIDE 0.9 % IV BOLUS
500.0000 mL | Freq: Once | INTRAVENOUS | Status: AC
  Administered 2021-02-22: 500 mL via INTRAVENOUS

## 2021-02-22 NOTE — ED Triage Notes (Signed)
Shortness of breath with exertion , denies chest pain . Hx Afib

## 2021-02-22 NOTE — ED Notes (Signed)
Pt verbalizes understanding of discharge instructions. Opportunity for questioning and answers were provided. [place] removed by staff, pt discharged from ED to home. Educated to f/u with PCP.

## 2021-02-22 NOTE — ED Provider Notes (Signed)
[facility] [facility] EMERGENCY DEPT Provider Note   CSN: [id number] Arrival date & time: 02/22/21  2103     History Chief Complaint  Patient presents with   Shortness of Breath    Tammy Davies is a 60 y.o. female.  The history is provided by the patient.  Shortness of Breath Severity:  Mild Onset quality:  Gradual Duration:  1 day Timing:  Intermittent Progression:  Waxing and waning Chronicity:  New Context comment:  Feeling SOB, had flu and covid vaccine yesterday Relieved by:  Nothing Worsened by:  Nothing Associated symptoms: no abdominal pain, no chest pain, no claudication, no cough, no diaphoresis, no ear pain, no fever, no rash, no sore throat, no sputum production, no syncope, no swollen glands and no vomiting   Risk factors: no hx of PE/DVT   Risk factors comment:  Atrial fibrillation on blood thinner     Past Medical History:  Diagnosis Date   Dysrhythmia    History of COVID-19    PAF (paroxysmal atrial fibrillation) (HCC)    Tobacco abuse     Patient Active Problem List   Diagnosis Date Noted   Ileitis 10/29/2020   Persistent atrial fibrillation (HCC)    Obesity (BMI 30-39.9) 06/18/2020   Chronic anticoagulation 06/18/2020   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   Hypoxia 06/18/2020   Protein-calorie malnutrition, moderate ([place]) 06/18/2020   Status post cystoscopy with ureteral stent placement 06/18/2020   Hematuria due to cystitis from perforated diverticulitis 06/18/2020   PAF (paroxysmal atrial fibrillation) ([place])    History of COVID-19    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 06/19/2020 06/06/2020   Hyperglycemia 06/08/2017   Hypertension 06/08/2017   S/P laparoscopic cholecystectomy 08/20/2016   Tobacco abuse 08/20/2016   AF (paroxysmal atrial fibrillation) ([place]) 08/20/2016   Pancreatitis 08/15/2016   Liver mass, 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    Past Surgical History:  Procedure Laterality Date    ABDOMINAL HYSTERECTOMY     CARDIOVERSION N/A 07/22/2020   Procedure: CARDIOVERSION;  Surgeon: [name], MD;  Location: [place];  Service: Cardiovascular;  Laterality: N/A;   CHOLECYSTECTOMY N/A 08/18/2016   Procedure: LAPAROSCOPIC CHOLECYSTECTOMY WITH  INTRAOPERATIVE CHOLANGIOGRAM;  Surgeon: Johnathan Hausen, MD;  Location: [facility];  Service: General;  Laterality: N/A;   COLECTOMY WITH COLOSTOMY CREATION/HARTMANN PROCEDURE N/A 06/18/2020   Procedure: SIGMOID COLECTOMY WITH COLOSTOMY CREATION; DRAINAGE OF PELVIC ABSCESS;  Surgeon: Rolm Bookbinder, MD;  Location: [place];  Service: General;  Laterality: N/A;   CYSTOSCOPY WITH STENT PLACEMENT Bilateral 06/18/2020   Procedure: CYSTOSCOPY WITH STENT PLACEMENT;  Surgeon: Alexis Frock, MD;  Location: [place];  Service: Urology;  Laterality: Bilateral;   LAPAROTOMY N/A 06/18/2020   Procedure: EXPLORATORY LAPAROTOMY;  Surgeon: Rolm Bookbinder, MD;  Location: [place];  Service: General;  Laterality: N/A;     OB History   No obstetric history on file.     Family History  Problem Relation Age of Onset   Heart failure Mother    Breast cancer Mother    Heart failure Father    Diabetes Father    Microcephaly Father    Colon cancer Neg Hx    Stomach cancer Neg Hx    Esophageal cancer Neg Hx    Pancreatic cancer Neg Hx    Liver disease Neg Hx     Social History   Tobacco Use   Smoking status: Every Day    Types: Cigarettes   Smokeless tobacco: Never  Vaping Use   Vaping  Use: Never used  Substance Use Topics   Alcohol use: No   Drug use: Yes    Types: Marijuana    Comment: last used in 6 months    Home Medications Prior to Admission medications   Medication Sig Start Date End Date Taking? Authorizing Provider  apixaban (ELIQUIS) 5 MG TABS tablet Take 1 tablet (5 mg total) by mouth 2 (two) times daily. 08/23/20   Belva Crome, MD  diltiazem (CARDIZEM CD) 240 MG 24 hr capsule Take 1 capsule (240 mg total) by mouth daily. Appointment  Required For Further Refills [phone] 02/13/21   Sherran Needs, NP  metoprolol succinate (TOPROL-XL) 25 MG 24 hr tablet Take 25 mg by mouth daily.    [provider]  nicotine (NICODERM CQ - DOSED IN MG/24 HOURS) 14 mg/24hr patch Place 1 patch (14 mg total) onto the skin daily. 11/01/20   Winferd Humphrey, PA-C  ondansetron (ZOFRAN) 8 MG tablet Take one tablet 20 minutes before drinking each half of the prep 09/02/20   Irene Shipper, MD    Allergies    Heparin  Review of Systems   Review of Systems  Constitutional:  Negative for chills, diaphoresis and fever.  HENT:  Negative for ear pain and sore throat.   Eyes:  Negative for pain and visual disturbance.  Respiratory:  Positive for shortness of breath. Negative for cough and sputum production.   Cardiovascular:  Negative for chest pain, palpitations, claudication and syncope.  Gastrointestinal:  Negative for abdominal pain and vomiting.  Genitourinary:  Negative for dysuria and hematuria.  Musculoskeletal:  Negative for arthralgias and back pain.  Skin:  Negative for color change and rash.  Neurological:  Negative for seizures and syncope.  All other systems reviewed and are negative.  Physical Exam Updated Vital Signs BP 116/76   Pulse 85   Temp (!) 97.5 F (36.4 C)   Resp 17   Ht 5' 8"  (1.727 m)   Wt 104.3 kg   SpO2 97%   BMI 34.97 kg/m   Physical Exam Vitals and nursing note reviewed.  Constitutional:      General: She is not in acute distress.    Appearance: She is well-developed. She is not ill-appearing.  HENT:     Head: Normocephalic and atraumatic.  Eyes:     Extraocular Movements: Extraocular movements intact.     Conjunctiva/sclera: Conjunctivae normal.     Pupils: Pupils are equal, round, and reactive to light.  Cardiovascular:     Rate and Rhythm: Normal rate and regular rhythm.     Pulses: Normal pulses.     Heart sounds: Normal heart sounds. No murmur heard. Pulmonary:     Effort:  Pulmonary effort is normal. No respiratory distress.     Breath sounds: Normal breath sounds. No decreased breath sounds or wheezing.  Abdominal:     Palpations: Abdomen is soft.     Tenderness: There is no abdominal tenderness.  Musculoskeletal:        General: Normal range of motion.     Cervical back: Normal range of motion and neck supple.     Right lower leg: No edema.     Left lower leg: No edema.  Skin:    General: Skin is warm and dry.     Capillary Refill: Capillary refill takes less than 2 seconds.  Neurological:     General: No focal deficit present.     Mental Status: She is alert.  Psychiatric:  Mood and Affect: Mood normal.    ED Results / Procedures / Treatments   Labs (all labs ordered are listed, but only abnormal results are displayed) Labs Reviewed  CBC WITH DIFFERENTIAL/PLATELET - Abnormal; Notable for the following components:      Result Value   RBC 5.57 (*)    Hemoglobin 15.1 (*)    HCT 46.8 (*)    RDW 16.0 (*)    All other components within normal limits  COMPREHENSIVE METABOLIC PANEL - Abnormal; Notable for the following components:   CO2 20 (*)    Glucose, Bld 103 (*)    All other components within normal limits  RESP PANEL BY RT-PCR (FLU A&B, COVID) [id number]  TROPONIN I (HIGH SENSITIVITY)    EKG EKG Interpretation  Date/Time:  Saturday February 22 2021 21:22:02 EDT Ventricular Rate:  90 PR Interval:    QRS Duration: 144 QT Interval:  366 QTC Calculation: 448 R Axis:   -56 Text Interpretation: Atrial fibrillation Left bundle branch block Confirmed by Ronnald Nian, Lyndsie Wallman ([id number]) on 02/22/2021 10:15:06 PM  Radiology DG Chest Portable 1 View  Result Date: 02/22/2021 CLINICAL DATA:  Dyspnea EXAM: PORTABLE CHEST 1 VIEW COMPARISON:  06/16/2020 FINDINGS: Lungs are well expanded, symmetric, and clear. No pneumothorax or pleural effusion. Cardiac size within normal limits. Pulmonary vascularity is normal. Osseous structures are age-appropriate. No  acute bone abnormality. IMPRESSION: No active disease. Electronically Signed   By: Fidela Salisbury M.D.   On: 02/22/2021 21:53    Procedures Procedures   Medications Ordered in ED Medications  sodium chloride 0.9 % bolus 500 mL (500 mLs Intravenous New Bag/Given 02/22/21 2211)    ED Course  I have reviewed the triage vital signs and the nursing notes.  Pertinent labs & imaging results that were available during my care of the patient were reviewed by me and considered in my medical decision making (see chart for details).    MDM Rules/Calculators/A&P                           Tammy Davies is a 60 year old female history of atrial fibrillation on Eliquis who presents to the ED with shortness of breath.  Vital signs unremarkable.  No fever.  EKG shows rate controlled atrial fibrillation.  She has been having some shortness of breath today.  Not feeling well generally.  She had COVID and flu vaccine yesterday.  She denies any cough or sputum production.  No chest pain.  There are no ischemic changes on EKG.  She is not having chest pain.  No signs of volume overload on exam.  Chest x-ray with no evidence of infection.  No signs of volume overload.  Troponin within normal limits.  No significant anemia, electrolyte abnormality, kidney injury otherwise.  COVID test and flu test are negative.  Overall patient appears well.  No signs of respiratory distress.  Suspect may be side effects from vaccines yesterday.  Understands return precautions and discharged in ED in good condition.  This chart was dictated using voice recognition software.  Despite best efforts to proofread,  errors can occur which can change the documentation meaning.   Final Clinical Impression(s) / ED Diagnoses Final diagnoses:  SOB (shortness of breath)    Rx / DC Orders ED Discharge Orders     None        Lennice Sites, DO 02/22/21 2258

## 2021-03-04 ENCOUNTER — Other Ambulatory Visit: Payer: Self-pay

## 2021-03-04 ENCOUNTER — Encounter (HOSPITAL_COMMUNITY): Payer: Self-pay | Admitting: Nurse Practitioner

## 2021-03-04 ENCOUNTER — Ambulatory Visit (HOSPITAL_COMMUNITY)
Admission: RE | Admit: 2021-03-04 | Discharge: 2021-03-04 | Disposition: A | Discharge status: Home / Self Care | Payer: No Typology Code available for payment source | Source: Ambulatory Visit | Attending: Nurse Practitioner | Admitting: Nurse Practitioner

## 2021-03-04 VITALS — BP 112/78 | HR 86 | Ht 68.0 in | Wt 237.6 lb

## 2021-03-04 DIAGNOSIS — K5000 Crohn's disease of small intestine without complications: Secondary | ICD-10-CM | POA: Insufficient documentation

## 2021-03-04 DIAGNOSIS — Z9049 Acquired absence of other specified parts of digestive tract: Secondary | ICD-10-CM | POA: Diagnosis not present

## 2021-03-04 DIAGNOSIS — Z7901 Long term (current) use of anticoagulants: Secondary | ICD-10-CM | POA: Insufficient documentation

## 2021-03-04 DIAGNOSIS — Z888 Allergy status to other drugs, medicaments and biological substances status: Secondary | ICD-10-CM | POA: Insufficient documentation

## 2021-03-04 DIAGNOSIS — Z79899 Other long term (current) drug therapy: Secondary | ICD-10-CM | POA: Insufficient documentation

## 2021-03-04 DIAGNOSIS — D6869 Other thrombophilia: Secondary | ICD-10-CM | POA: Diagnosis not present

## 2021-03-04 DIAGNOSIS — I4819 Other persistent atrial fibrillation: Secondary | ICD-10-CM

## 2021-03-04 DIAGNOSIS — I10 Essential (primary) hypertension: Secondary | ICD-10-CM | POA: Insufficient documentation

## 2021-03-04 DIAGNOSIS — R0609 Other forms of dyspnea: Secondary | ICD-10-CM | POA: Diagnosis not present

## 2021-03-04 DIAGNOSIS — F17210 Nicotine dependence, cigarettes, uncomplicated: Secondary | ICD-10-CM | POA: Diagnosis not present

## 2021-03-04 DIAGNOSIS — Z8616 Personal history of COVID-19: Secondary | ICD-10-CM | POA: Insufficient documentation

## 2021-03-04 DIAGNOSIS — Z933 Colostomy status: Secondary | ICD-10-CM | POA: Insufficient documentation

## 2021-03-04 DIAGNOSIS — G4730 Sleep apnea, unspecified: Secondary | ICD-10-CM | POA: Diagnosis not present

## 2021-03-04 DIAGNOSIS — I480 Paroxysmal atrial fibrillation: Secondary | ICD-10-CM | POA: Insufficient documentation

## 2021-03-04 DIAGNOSIS — Z8249 Family history of ischemic heart disease and other diseases of the circulatory system: Secondary | ICD-10-CM | POA: Diagnosis not present

## 2021-03-04 NOTE — Progress Notes (Signed)
Primary Care Physician: Pcp, No Referring Physician: [name] Cardiologist: Dr. Payton Doughty is a 60 y.o. 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  She was found to have persistent afib/flutter and was on Cardizem IV, transitioned to oral prior to d/c. She was on heparin for a while perioperatively but transitioned back to eliquis prior to d/c. She was also positive for Covid on admission but asymptomatic and has had all vaccines.   She was seen by Tammy Lis, PA, and still had RVR in the 120's and he added low dose BB to her daily Cardizem. However, pt misunderstood and stopped her CCB, still in atrial flutter(typical) today at 127 bpm. She states that she does not feel her heart rhythm and is tolerating this well. She expects to have a reverse colostomy in 6 months.   F/u in the afib clinic, 07/26/20. At time of cardioversion , she went from atrail flutter with 3-4 AVB, to afib with RVR at 160 bpm. She eas cardioverted 2 additional times but did not convert. She was given esmolol per anesthesiology and  was d/c in afib in the 90's. The ekg shows today that she remains in afib at 76 bpm.she states that the afib is not bothering her and she feels well. She is pending having her colonoscopy reversed in June. She continues on BB/CCB for heart rate control.   F/u in afib clinic, 03/04/21. Initially, she thought that she may have  colostomy reversed in June, however this did not occur. She was admitted in June with abdominal pain and terminal ileitis. She asked for this appointment as she has been feeling more short of breath. In SR with controlled v rate today. She mentions now that reversal may not be until January if at all, as the surgeon told her she has a lot of scar tissue. She would like to try to restore SR.  She has not noted any fluid retention. Her weight is up 12 lbs. She feels this represents true weight gain. She has cut back smoking to one pack a week. When she was in the hospital in June, she was told that she needed a sleep study as she had snoring and  desaturation in her O2 with sleep.   Today, she denies symptoms of palpitations, chest pain, shortness of breath, orthopnea, PND, lower extremity edema, dizziness, presyncope, syncope, or neurologic sequela. The patient is tolerating medications without difficulties and is otherwise without complaint today.   Past Medical History:  Diagnosis Date   Dysrhythmia    History of COVID-19    PAF (paroxysmal atrial fibrillation) ([place])    Tobacco abuse    Past Surgical History:  Procedure Laterality Date   ABDOMINAL HYSTERECTOMY     CARDIOVERSION N/A 07/22/2020   Procedure: CARDIOVERSION;  Surgeon: [name], MD;  Location: [facility];  Service: Cardiovascular;  Laterality: N/A;   CHOLECYSTECTOMY N/A 08/18/2016   Procedure: LAPAROSCOPIC CHOLECYSTECTOMY WITH  INTRAOPERATIVE CHOLANGIOGRAM;  Surgeon: Johnathan Hausen, MD;  Location: [facility];  Service: General;  Laterality: N/A;   COLECTOMY WITH COLOSTOMY CREATION/HARTMANN PROCEDURE N/A 06/18/2020   Procedure: SIGMOID COLECTOMY WITH COLOSTOMY CREATION; DRAINAGE OF PELVIC ABSCESS;  Surgeon: Rolm Bookbinder, MD;  Location: [place] [facility];  Service: General;  Laterality: N/A;   CYSTOSCOPY WITH STENT PLACEMENT Bilateral 06/18/2020  Procedure: CYSTOSCOPY WITH STENT PLACEMENT;  Surgeon: Alexis Frock, MD;  Location: [facility];  Service: Urology;  Laterality: Bilateral;   LAPAROTOMY N/A 06/18/2020   Procedure: EXPLORATORY LAPAROTOMY;  Surgeon: Rolm Bookbinder, MD;  Location: [facility];  Service: General;  Laterality: N/A;    Current Outpatient Medications  Medication Sig Dispense Refill   apixaban (ELIQUIS) 5 MG TABS tablet Take 1 tablet (5 mg total) by mouth 2 (two) times daily. 180 tablet 1   diltiazem (CARDIZEM  CD) 240 MG 24 hr capsule Take 1 capsule (240 mg total) by mouth daily. Appointment Required For Further Refills [phone] 30 capsule 0   metoprolol succinate (TOPROL-XL) 25 MG 24 hr tablet Take 25 mg by mouth daily.     nicotine (NICODERM CQ - DOSED IN MG/24 HOURS) 14 mg/24hr patch Place 1 patch (14 mg total) onto the skin daily. (Patient not taking: Reported on 03/04/2021) 28 patch 0   No current facility-administered medications for this encounter.    Allergies  Allergen Reactions   Heparin Other (See Comments)    06/23/20 HIT AB ODT =3 very high likelihood of true HIT; 2/7 SRA negative    Social History   Socioeconomic History   Marital status: Single    Spouse name: Not on file   Number of children: Not on file   Years of education: Not on file   Highest education level: Not on file  Occupational History   Not on file  Tobacco Use   Smoking status: Every Day    Types: Cigarettes   Smokeless tobacco: Never  Vaping Use   Vaping Use: Never used  Substance and Sexual Activity   Alcohol use: No   Drug use: Yes    Types: Marijuana    Comment: last used in 6 months   Sexual activity: Never  Other Topics Concern   Not on file  Social History Narrative   Not on file   Social Determinants of Health   Financial Resource Strain: Not on file  Food Insecurity: Not on file  Transportation Needs: Not on file  Physical Activity: Not on file  Stress: Not on file  Social Connections: Not on file  Intimate Partner Violence: Not on file    Family History  Problem Relation Age of Onset   Heart failure Mother    Breast cancer Mother    Heart failure Father    Diabetes Father    Microcephaly Father    Colon cancer Neg Hx    Stomach cancer Neg Hx    Esophageal cancer Neg Hx    Pancreatic cancer Neg Hx    Liver disease Neg Hx     ROS- All systems are reviewed and negative except as per the HPI above  Physical Exam: Vitals:   03/04/21 0827  Weight: 107.8 kg  Height:  5' 8"  (1.727 m)   Wt Readings from Last 3 Encounters:  03/04/21 107.8 kg  02/22/21 104.3 kg  11/27/20 102.5 kg    Labs: Lab Results  Component Value Date   NA 136 02/22/2021   K 4.3 02/22/2021   CL 106 02/22/2021   CO2 20 (L) 02/22/2021   GLUCOSE 103 (H) 02/22/2021   BUN 11 02/22/2021   CREATININE 0.73 02/22/2021   CALCIUM 9.6 02/22/2021   PHOS 3.5 06/20/2020   MG 1.8 10/29/2020   Lab Results  Component Value Date   INR 1.03 06/09/2017   Lab Results  Component Value Date   CHOL 173 06/08/2017  HDL 40 (L) 06/08/2017   LDLCALC 110 (H) 06/08/2017   TRIG 124 06/22/2020     GEN- The patient is well appearing, alert and oriented x 3 today.   Head- normocephalic, atraumatic Eyes-  Sclera clear, conjunctiva pink Ears- hearing intact Oropharynx- clear Neck- supple, no JVP Lymph- no cervical lymphadenopathy Lungs- Clear to ausculation bilaterally, normal work of breathing Heart- irregular rate and rhythm, no murmurs, rubs or gallops, PMI not laterally displaced GI- soft, NT, ND, + BS Extremities- no clubbing, cyanosis, or edema MS- no significant deformity or atrophy Skin- no rash or lesion Psych- euthymic mood, full affect Neuro- strength and sensation are intact  EKG- afib at 86 bpm, qrs int 148 ms, qtc 473 ms    Assessment and Plan: 1. Afib/flutter with RVR Persistent  since her last hospitalization late January 2021 On cardioversion, she went from  aflutter to afib and did not shock out  She states she was asymptomatic in rate controlled afib Now she is feeling shortness of breath with exertion and would like to pursue restoring SR  Will obtain echo and then make decisions with appropriate antiarrythmic to proceed with  She will continue toprol xl 25 mg daily and  Cardizem 240 mg daily   2. CHA2DS2VASc score of 2 Continue eliquis 5 mg bid   3.HTN Stable   4. Tobacco abuse Smoking cessation encouraged, she has cut way back and continues on nicotine  patches   5. Sleep apnea  Snoring and drops in 02 noted by nursing staff with most recent hospitalization Sleep study noted   5. BMI of 36.13 Has gained around 12 lbs over the last few months Weight loss encouraged   Tammy Davies, [facility] [address] [place], [place] [address] [phone]

## 2021-03-07 ENCOUNTER — Telehealth: Payer: Self-pay | Admitting: *Deleted

## 2021-03-07 NOTE — Telephone Encounter (Signed)
Prior Authorization for split night sleep study sent to [facility] via Fax [phone].

## 2021-03-20 NOTE — Telephone Encounter (Signed)
Received a phone call from [facility] requesting clinical notes. They were informed the notes were sent with the original referral information. Fax number was confirmed and notes re faxed.  Case # [id number].

## 2021-03-25 ENCOUNTER — Other Ambulatory Visit: Payer: Self-pay | Admitting: Nurse Practitioner

## 2021-03-25 ENCOUNTER — Other Ambulatory Visit (HOSPITAL_COMMUNITY): Payer: Self-pay | Admitting: Nurse Practitioner

## 2021-03-25 DIAGNOSIS — I480 Paroxysmal atrial fibrillation: Secondary | ICD-10-CM

## 2021-03-25 DIAGNOSIS — R0902 Hypoxemia: Secondary | ICD-10-CM

## 2021-03-25 DIAGNOSIS — R0683 Snoring: Secondary | ICD-10-CM

## 2021-03-25 NOTE — Telephone Encounter (Signed)
Received a call from [facility] informed split night sleep study has been denied. Ordering provider will be notified for further recommendations.

## 2021-04-07 ENCOUNTER — Ambulatory Visit (HOSPITAL_COMMUNITY)
Admission: RE | Admit: 2021-04-07 | Discharge: 2021-04-07 | Disposition: A | Discharge status: Home / Self Care | Payer: No Typology Code available for payment source | Source: Ambulatory Visit | Attending: Nurse Practitioner | Admitting: Nurse Practitioner

## 2021-04-07 ENCOUNTER — Other Ambulatory Visit: Payer: Self-pay

## 2021-04-07 DIAGNOSIS — F17200 Nicotine dependence, unspecified, uncomplicated: Secondary | ICD-10-CM | POA: Diagnosis not present

## 2021-04-07 DIAGNOSIS — I4819 Other persistent atrial fibrillation: Secondary | ICD-10-CM | POA: Diagnosis not present

## 2021-04-07 DIAGNOSIS — I517 Cardiomegaly: Secondary | ICD-10-CM | POA: Insufficient documentation

## 2021-04-07 LAB — ECHOCARDIOGRAM COMPLETE: S' Lateral: 5 cm

## 2021-04-16 ENCOUNTER — Ambulatory Visit (HOSPITAL_COMMUNITY)
Admission: RE | Admit: 2021-04-16 | Discharge: 2021-04-16 | Disposition: A | Discharge status: Home / Self Care | Payer: No Typology Code available for payment source | Source: Ambulatory Visit | Attending: Nurse Practitioner | Admitting: Nurse Practitioner

## 2021-04-16 ENCOUNTER — Telehealth: Payer: Self-pay | Admitting: Pharmacist

## 2021-04-16 VITALS — BP 130/90 | HR 95 | Ht 68.0 in | Wt 241.8 lb

## 2021-04-16 DIAGNOSIS — I10 Essential (primary) hypertension: Secondary | ICD-10-CM | POA: Insufficient documentation

## 2021-04-16 DIAGNOSIS — I4819 Other persistent atrial fibrillation: Secondary | ICD-10-CM | POA: Diagnosis present

## 2021-04-16 DIAGNOSIS — K7689 Other specified diseases of liver: Secondary | ICD-10-CM | POA: Insufficient documentation

## 2021-04-16 DIAGNOSIS — Z933 Colostomy status: Secondary | ICD-10-CM | POA: Insufficient documentation

## 2021-04-16 DIAGNOSIS — I4892 Unspecified atrial flutter: Secondary | ICD-10-CM | POA: Diagnosis not present

## 2021-04-16 DIAGNOSIS — D6869 Other thrombophilia: Secondary | ICD-10-CM | POA: Diagnosis not present

## 2021-04-16 DIAGNOSIS — F17210 Nicotine dependence, cigarettes, uncomplicated: Secondary | ICD-10-CM | POA: Insufficient documentation

## 2021-04-16 LAB — BASIC METABOLIC PANEL
Anion gap: 7 (ref 5–15)
BUN: 13 mg/dL (ref 6–20)
CO2: 23 mmol/L (ref 22–32)
Calcium: 9.7 mg/dL (ref 8.9–10.3)
Chloride: 108 mmol/L (ref 98–111)
Creatinine, Ser: 0.79 mg/dL (ref 0.44–1.00)
GFR, Estimated: >60 mL/min (ref >60)
Glucose, Bld: 98 mg/dL (ref 70–99)
Potassium: 4.2 mmol/L (ref 3.5–5.1)
Sodium: 138 mmol/L (ref 135–145)

## 2021-04-16 LAB — MAGNESIUM: Magnesium: 2 mg/dL (ref 1.7–2.4)

## 2021-04-16 MED ORDER — METOPROLOL SUCCINATE ER 100 MG PO TB24: 100.0000 mg | ORAL_TABLET | Freq: Every day | ORAL | 3 refills | Status: DC

## 2021-04-16 NOTE — Progress Notes (Signed)
Primary Care Physician: [facility] [place] Referring Physician: Robbie Lis Cardiologist: Dr. Payton Doughty is a 60 y.o. 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  She was found to have persistent afib/flutter and was on Cardizem IV, transitioned to oral prior to d/c. She was on heparin for a while perioperatively but transitioned back to eliquis prior to d/c. She was also positive for Covid on admission but asymptomatic and has had all vaccines.   She was seen by Robbie Lis, PA, and still had RVR in the 120's and he added low dose BB to her daily Cardizem. However, pt misunderstood and stopped her CCB, still in atrial flutter(typical) today at 127 bpm. She states that she does not feel her heart rhythm and is tolerating this well. She expects to have a reverse colostomy in 6 months.   F/u in the afib clinic, 07/26/20. At time of cardioversion , she went from atrail flutter with 3-4 AVB, to afib with RVR at 160 bpm. She eas cardioverted 2 additional times but did not convert. She was given esmolol per anesthesiology and  was d/c in afib in the 90's. The ekg shows today that she remains in afib at 76 bpm.she states that the afib is not bothering her and she feels well. She is pending having her colonoscopy reversed in June. She continues on BB/CCB for heart rate control.   F/u in afib clinic, 03/04/21. Initially, she thought that she may have  colostomy reversed in June, however this did not occur. She was admitted in June with abdominal pain and terminal ileitis. She asked for this appointment as she has been feeling more short of breath. In SR with controlled v rate today. She mentions now that reversal may not be until January if at all, as the surgeon told her she has a lot of scar tissue. She would like to try  to restore SR. She has not noted any fluid retention. Her weight is up 12 lbs. She feels this represents true weight gain. She has cut back smoking to one pack a week. When she was in the hospital in June, she was told that she needed a sleep study as she had snoring and  desaturation in her O2 with sleep.   04/16/21, Pt is here to f/u up on echo that showed moderately reduced EF at 30-35%. I  discussed with Dr. Tamala Julian and he thought probably [facility] and would try to restore sinus rhythm to improve EF, medication adjustment to address EFdysfunction and after restoring SR, he will consider further if ischemic w/u is needed. Weight is up a few lbs, but she states not fluid. I cannot appreciate any fluid and pt states that her diet is terrible. She has cut back but continues to smoke. She actually states that her breathing is improved as compared when she asked to be seen in October. She is agreeable to coming into hospital for Tikosyn but wants to wait until early January.   Today, she denies symptoms of palpitations, chest pain, shortness of breath, orthopnea, PND, lower extremity edema, dizziness, presyncope, syncope, or neurologic sequela. The patient is tolerating medications without difficulties and is otherwise without complaint today.   Past Medical History:  Diagnosis Date   Dysrhythmia    History of COVID-19    PAF (paroxysmal  atrial fibrillation) ([place])    Tobacco abuse    Past Surgical History:  Procedure Laterality Date   ABDOMINAL HYSTERECTOMY     CARDIOVERSION N/A 07/22/2020   Procedure: CARDIOVERSION;  Surgeon: [name], MD;  Location: [place] [facility];  Service: Cardiovascular;  Laterality: N/A;   CHOLECYSTECTOMY N/A 08/18/2016   Procedure: LAPAROSCOPIC CHOLECYSTECTOMY WITH  INTRAOPERATIVE CHOLANGIOGRAM;  Surgeon: Johnathan Hausen, MD;  Location: [facility];  Service: General;  Laterality: N/A;   COLECTOMY WITH COLOSTOMY CREATION/HARTMANN PROCEDURE N/A 06/18/2020   Procedure: SIGMOID COLECTOMY  WITH COLOSTOMY CREATION; DRAINAGE OF PELVIC ABSCESS;  Surgeon: Rolm Bookbinder, MD;  Location: [place];  Service: General;  Laterality: N/A;   CYSTOSCOPY WITH STENT PLACEMENT Bilateral 06/18/2020   Procedure: CYSTOSCOPY WITH STENT PLACEMENT;  Surgeon: Alexis Frock, MD;  Location: [place];  Service: Urology;  Laterality: Bilateral;   LAPAROTOMY N/A 06/18/2020   Procedure: EXPLORATORY LAPAROTOMY;  Surgeon: Rolm Bookbinder, MD;  Location: [place];  Service: General;  Laterality: N/A;    Current Outpatient Medications  Medication Sig Dispense Refill   apixaban (ELIQUIS) 5 MG TABS tablet Take 1 tablet (5 mg total) by mouth 2 (two) times daily. 180 tablet 1   diltiazem (CARDIZEM CD) 240 MG 24 hr capsule Take 1 capsule (240 mg total) by mouth daily. 30 capsule 6   metoprolol succinate (TOPROL-XL) 25 MG 24 hr tablet Take 25 mg by mouth daily.     Multiple Vitamin (MULTIVITAMIN) capsule Take 1 capsule by mouth daily. 50 plus     nicotine (NICODERM CQ - DOSED IN MG/24 HOURS) 14 mg/24hr patch Place 1 patch (14 mg total) onto the skin daily. (Patient not taking: Reported on 03/04/2021) 28 patch 0   No current facility-administered medications for this encounter.    Allergies  Allergen Reactions   Heparin Other (See Comments)    06/23/20 HIT AB ODT =3 very high likelihood of true HIT; 2/7 SRA negative    Social History   Socioeconomic History   Marital status: Single    Spouse name: Not on file   Number of children: Not on file   Years of education: Not on file   Highest education level: Not on file  Occupational History   Not on file  Tobacco Use   Smoking status: Every Day    Types: Cigarettes   Smokeless tobacco: Never  Vaping Use   Vaping Use: Never used  Substance and Sexual Activity   Alcohol use: No   Drug use: Yes    Types: Marijuana    Comment: last used in 6 months   Sexual activity: Never  Other Topics Concern   Not on file  Social History Narrative   Not on file    Social Determinants of Health   Financial Resource Strain: Not on file  Food Insecurity: Not on file  Transportation Needs: Not on file  Physical Activity: Not on file  Stress: Not on file  Social Connections: Not on file  Intimate Partner Violence: Not on file    Family History  Problem Relation Age of Onset   Heart failure Mother    Breast cancer Mother    Heart failure Father    Diabetes Father    Microcephaly Father    Colon cancer Neg Hx    Stomach cancer Neg Hx    Esophageal cancer Neg Hx    Pancreatic cancer Neg Hx    Liver disease Neg Hx     ROS- All systems are reviewed and negative  except as per the HPI above  Physical Exam: Vitals:   04/16/21 0827  Weight: 109.7 kg  Height: 5' 8"  (1.727 m)   Wt Readings from Last 3 Encounters:  04/16/21 109.7 kg  03/04/21 107.8 kg  02/22/21 104.3 kg    Labs: Lab Results  Component Value Date   NA 136 02/22/2021   K 4.3 02/22/2021   CL 106 02/22/2021   CO2 20 (L) 02/22/2021   GLUCOSE 103 (H) 02/22/2021   BUN 11 02/22/2021   CREATININE 0.73 02/22/2021   CALCIUM 9.6 02/22/2021   PHOS 3.5 06/20/2020   MG 1.8 10/29/2020   Lab Results  Component Value Date   INR 1.03 06/09/2017   Lab Results  Component Value Date   CHOL 173 06/08/2017   HDL 40 (L) 06/08/2017   LDLCALC 110 (H) 06/08/2017   TRIG 124 06/22/2020     GEN- The patient is well appearing, alert and oriented x 3 today.   Head- normocephalic, atraumatic Eyes-  Sclera clear, conjunctiva pink Ears- hearing intact Oropharynx- clear Neck- supple, no JVP Lymph- no cervical lymphadenopathy Lungs- Clear to ausculation bilaterally, normal work of breathing Heart- irregular rate and rhythm, no murmurs, rubs or gallops, PMI not laterally displaced GI- soft, NT, ND, + BS Extremities- no clubbing, cyanosis, or edema MS- no significant deformity or atrophy Skin- no rash or lesion Psych- euthymic mood, full affect Neuro- strength and sensation are  intact  EKG- afib at 95  bpm, qrs int 146 ms, qtc 464  ms  Echo- 1. Left ventricular ejection fraction, by estimation, is 30 to 35%. The  left ventricle has moderately decreased function. The left ventricle  demonstrates global hypokinesis. The left ventricular internal cavity size  was severely dilated. There is mild  left ventricular hypertrophy. Left ventricular diastolic parameters are  indeterminate.   2. Right ventricular systolic function is normal. The right ventricular  size is normal. Tricuspid regurgitation signal is inadequate for assessing  PA pressure.   3. Left atrial size was mildly dilated.   4. The mitral valve is normal in structure. No evidence of mitral valve  regurgitation.   5. The aortic valve is tricuspid. Aortic valve regurgitation is not  visualized. No aortic stenosis is present.   6. The inferior vena cava is dilated in size with >50% respiratory  variability, suggesting right atrial pressure of 8 mmHg.   FINDINGS   Left Ventricle: Left ventricular ejection fraction, by estimation, is 30  to 35%. The left ventricle has moderately decreased function. The left  ventricle demonstrates global hypokinesis. The left ventricular internal  cavity size was severely dilated.  There is mild left ventricular hypertrophy. Left ventricular diastolic  parameters are indeterminate.   Right Ventricle: The right ventricular size is normal. Right vetricular  wall thickness was not well visualized. Right ventricular systolic  function is normal. Tricuspid regurgitation signal is inadequate for  assessing PA pressure.              Assessment and Plan: 1. Afib/flutter with RVR Persistent  since her last hospitalization late January 2021 With  cardioversion, 07/22/20, she went from  aflutter to afib and did not shock out  She states she was asymptomatic in rate controlled afib and did not want to purse SR when I first saw pt early in the year  Now she is feeling  shortness of breath with exertion, EF is reduced to 30-35%  and  she would like to pursue restoring SR with admission  for dofetilide  Pt would like to come in January 9th  General guidelines for Tikosyn admit discussed with pt  Bmet/matg today   2. LV dysfunction Will make med changes to treat LV dysfunction Stop cardizem Increase Toprol to 75 mg tomorrow and if tolerates increase to 100 mg daily  She will come back for ekg and BP check next Tuesday as she will be going out of town after that for one week  I will try to add low dose  arb next week if able   3.  CHA2DS2VASc score of 2 Continue eliquis 5 mg bid  States no missed doses  Reminded not to miss doses with planned admission   4.HTN Stable   4. Tobacco abuse Smoking cessation encouraged, she has cut way back and continues on nicotine patches   5. Sleep apnea  Snoring and drops in 02 noted by nursing staff with most recent hospitalization Sleep study pending   5. BMI of 36.13 Has gained around 12 -15 lbs over the last few months She states that her diet is terrible and this is true weight gain Weight loss encouraged  If notices fluid notify the office and will start spironolactone   She will need f/u with Dr. Tamala Julian post hospitalization for further evaluation of LV dysfunction, late January appointment requested   Geroge Baseman. Jesika Men, [facility] [address] [place], [place] [address] [phone]

## 2021-04-16 NOTE — Patient Instructions (Signed)
Stop cardizem (diltiazem)  Increase metoprolol to 117m a day

## 2021-04-16 NOTE — Telephone Encounter (Signed)
Medication list reviewed in anticipation of upcoming Tikosyn initiation. Patient is not taking any contraindicated or QTc prolonging medications.   Patient is anticoagulated on Eliquis 66m BID on the appropriate dose. Please ensure that patient has not missed any anticoagulation doses in the 3 weeks prior to Tikosyn initiation.   Patient will need to be counseled to avoid use of Benadryl while on Tikosyn and in the 2-3 days prior to Tikosyn initiation.

## 2021-04-18 ENCOUNTER — Ambulatory Visit (HOSPITAL_BASED_OUTPATIENT_CLINIC_OR_DEPARTMENT_OTHER): Payer: No Typology Code available for payment source | Attending: Nurse Practitioner | Admitting: Cardiovascular Disease

## 2021-04-18 ENCOUNTER — Other Ambulatory Visit: Payer: Self-pay

## 2021-04-18 DIAGNOSIS — I480 Paroxysmal atrial fibrillation: Secondary | ICD-10-CM

## 2021-04-18 DIAGNOSIS — G4733 Obstructive sleep apnea (adult) (pediatric): Secondary | ICD-10-CM | POA: Insufficient documentation

## 2021-04-18 DIAGNOSIS — R0902 Hypoxemia: Secondary | ICD-10-CM | POA: Diagnosis not present

## 2021-04-18 DIAGNOSIS — I408 Other acute myocarditis: Secondary | ICD-10-CM | POA: Diagnosis not present

## 2021-04-18 DIAGNOSIS — R0683 Snoring: Secondary | ICD-10-CM | POA: Insufficient documentation

## 2021-04-22 ENCOUNTER — Other Ambulatory Visit: Payer: Self-pay

## 2021-04-22 ENCOUNTER — Ambulatory Visit (HOSPITAL_COMMUNITY)
Admission: RE | Admit: 2021-04-22 | Discharge: 2021-04-22 | Disposition: A | Discharge status: Home / Self Care | Payer: No Typology Code available for payment source | Source: Ambulatory Visit | Attending: Nurse Practitioner | Admitting: Nurse Practitioner

## 2021-04-22 VITALS — BP 112/88 | HR 101

## 2021-04-22 DIAGNOSIS — D6869 Other thrombophilia: Secondary | ICD-10-CM

## 2021-04-22 DIAGNOSIS — I4819 Other persistent atrial fibrillation: Secondary | ICD-10-CM | POA: Diagnosis present

## 2021-04-22 NOTE — Progress Notes (Signed)
Pt in for EKG and BP check after CCB being stopped and metoprolol succinate being increased to 100 mg daily for treatment of LV dysfunction. HR is 92-100 bpm today in persistent afib. . She is pending coming into the hospital early January for [place] admit. She is going out of town for one week to [place]. When she returns, she will call the office and I will either go up on metoprolol or add low dose losartan. She  will track her HR closely with pulse ox and let me know when out of town if [facility] are well controlled, majority of same I want less than 100 bpm.

## 2021-05-05 ENCOUNTER — Encounter (HOSPITAL_BASED_OUTPATIENT_CLINIC_OR_DEPARTMENT_OTHER): Payer: Self-pay | Admitting: Cardiovascular Disease

## 2021-05-05 NOTE — Procedures (Signed)
° ° ° ° ° °  Patient Name: [name], [name] Date: 04/20/2021 Gender: Female D.O.B: 1961-04-17 Age (years): 59 Referring Provider: Sherran Needs Height (inches): 68 Interpreting Physician: Shelva Majestic MD, ABSM Weight (lbs): 230 RPSGT: Jacolyn Reedy BMI: 36 MRN: [id number] Neck Size: 15.50  CLINICAL INFORMATION Sleep Study Type: HST  Indication for sleep study: snoring, excessive daytime sleepiness, PAF  Epworth Sleepiness Score: 14  SLEEP STUDY TECHNIQUE A multi-channel overnight portable sleep study was performed. The channels recorded were: nasal airflow, thoracic respiratory movement, and oxygen saturation with a pulse oximetry. Snoring was also monitored.  MEDICATIONS apixaban (ELIQUIS) 5 MG TABS tablet metoprolol succinate (TOPROL XL) 100 MG 24 hr tablet Multiple Vitamin (MULTIVITAMIN) capsule nicotine (NICODERM CQ - DOSED IN MG/24 HOURS) 14 mg/24hr patch  Patient self administered medications include: N/A.  SLEEP ARCHITECTURE Patient was studied for 347.4 minutes. The sleep efficiency was 100.0 % and the patient was supine for 72.5%. The arousal index was 0.0 per hour.  RESPIRATORY PARAMETERS The overall AHI was 28.2 per hour, with a central apnea index of 0 per hour.  The oxygen nadir was 89% during sleep.  CARDIAC DATA Mean heart rate during sleep was 85.4 bpm.  IMPRESSIONS - Moderate obstructive sleep apnea occurred during this study (AHI 28.2/h). Supine sleep AHI was 29.8/h. The severity during REM sleep cannot be assessed on this home study - Mild oxygen desaturation was noted during this study (Min O2 89%). - Patient snored 26.4% (91.9 minutes) during the sleep  DIAGNOSIS - Obstructive Sleep Apnea (G47.33)  RECOMMENDATIONS - Therapeutic CPAP titration to treat her moderately severe sleep disorded breathing. If unable to have an in-lab study, initiate Auto - PAP with EPR of 3 at 7 - 18 cm of water. - Efort should be made to optimize nasal  and oropharynheal patency. - Avoid alcohol, sedatives and other CNS depressants that may worsen sleep apnea and disrupt normal sleep architecture. - Sleep hygiene should be reviewed to assess factors that may improve sleep quality. - Weight management [facility]) and regular exercise should be initiated or continued. - Recommend a download and sleep clinic evaluation after one month of therapy.   [Electronically signed] 05/05/2021 07:54 AM  Shelva Majestic MD, [facility], [place], American Board of Sleep Medicine   NPI: [id number]  [place] PH: [phone]   FX: [phone] [place]

## 2021-05-06 ENCOUNTER — Other Ambulatory Visit: Payer: Self-pay | Admitting: Cardiovascular Disease

## 2021-05-06 ENCOUNTER — Telehealth: Payer: Self-pay | Admitting: *Deleted

## 2021-05-06 DIAGNOSIS — I480 Paroxysmal atrial fibrillation: Secondary | ICD-10-CM

## 2021-05-06 DIAGNOSIS — G4733 Obstructive sleep apnea (adult) (pediatric): Secondary | ICD-10-CM

## 2021-05-06 NOTE — Telephone Encounter (Signed)
-----   Message from Troy Sine, MD sent at 05/05/2021  8:00 AM EST ----- Mariann Laster, please notify pt of result and set up for Auto-PAP if unable for in-lab titration

## 2021-05-06 NOTE — Telephone Encounter (Signed)
Patient notified of HST results and recommendations. She agrees to proceed with having CPAP titration.

## 2021-05-22 ENCOUNTER — Telehealth: Payer: Self-pay | Admitting: *Deleted

## 2021-05-22 NOTE — Telephone Encounter (Signed)
PA request for CPAP titration faxed to [employer] at [phone]. Pending Auth # [id number].

## 2021-05-22 NOTE — Telephone Encounter (Signed)
-----   Message from Lauralee Evener, [place] sent at 05/06/2021 12:37 PM EST ----- CPAP titration

## 2021-05-23 ENCOUNTER — Other Ambulatory Visit (HOSPITAL_COMMUNITY): Payer: Self-pay | Admitting: Physician Assistant

## 2021-05-24 LAB — SARS CORONAVIRUS 2 (TAT 6-24 HRS): SARS Coronavirus 2: NEGATIVE

## 2021-05-26 ENCOUNTER — Telehealth (HOSPITAL_COMMUNITY): Payer: Self-pay

## 2021-05-26 ENCOUNTER — Other Ambulatory Visit (HOSPITAL_COMMUNITY): Payer: Self-pay

## 2021-05-26 ENCOUNTER — Inpatient Hospital Stay (HOSPITAL_COMMUNITY)
Admission: AD | Admit: 2021-05-26 | Discharge: 2021-05-29 | DRG: 309 | Disposition: A | Discharge status: Home / Self Care | Payer: No Typology Code available for payment source | Attending: Cardiology | Admitting: Cardiology

## 2021-05-26 ENCOUNTER — Other Ambulatory Visit: Payer: Self-pay

## 2021-05-26 ENCOUNTER — Ambulatory Visit (HOSPITAL_COMMUNITY)
Admission: RE | Admit: 2021-05-26 | Discharge: 2021-05-26 | Disposition: A | Discharge status: Home / Self Care | Payer: No Typology Code available for payment source | Source: Ambulatory Visit | Attending: Physician Assistant | Admitting: Physician Assistant

## 2021-05-26 VITALS — BP 108/72 | HR 107 | Ht 68.0 in | Wt 239.0 lb

## 2021-05-26 DIAGNOSIS — Z8616 Personal history of COVID-19: Secondary | ICD-10-CM | POA: Diagnosis not present

## 2021-05-26 DIAGNOSIS — Z833 Family history of diabetes mellitus: Secondary | ICD-10-CM | POA: Diagnosis not present

## 2021-05-26 DIAGNOSIS — I5022 Chronic systolic (congestive) heart failure: Secondary | ICD-10-CM | POA: Diagnosis present

## 2021-05-26 DIAGNOSIS — D6869 Other thrombophilia: Secondary | ICD-10-CM | POA: Insufficient documentation

## 2021-05-26 DIAGNOSIS — F17210 Nicotine dependence, cigarettes, uncomplicated: Secondary | ICD-10-CM | POA: Diagnosis present

## 2021-05-26 DIAGNOSIS — K5000 Crohn's disease of small intestine without complications: Secondary | ICD-10-CM | POA: Diagnosis present

## 2021-05-26 DIAGNOSIS — Z803 Family history of malignant neoplasm of breast: Secondary | ICD-10-CM

## 2021-05-26 DIAGNOSIS — I110 Hypertensive heart disease with heart failure: Secondary | ICD-10-CM | POA: Diagnosis present

## 2021-05-26 DIAGNOSIS — Z79899 Other long term (current) drug therapy: Secondary | ICD-10-CM

## 2021-05-26 DIAGNOSIS — Z6836 Body mass index (BMI) 36.0-36.9, adult: Secondary | ICD-10-CM | POA: Diagnosis not present

## 2021-05-26 DIAGNOSIS — E669 Obesity, unspecified: Secondary | ICD-10-CM | POA: Diagnosis present

## 2021-05-26 DIAGNOSIS — I4819 Other persistent atrial fibrillation: Secondary | ICD-10-CM | POA: Insufficient documentation

## 2021-05-26 DIAGNOSIS — I4892 Unspecified atrial flutter: Secondary | ICD-10-CM | POA: Diagnosis present

## 2021-05-26 DIAGNOSIS — Z7901 Long term (current) use of anticoagulants: Secondary | ICD-10-CM | POA: Diagnosis not present

## 2021-05-26 DIAGNOSIS — Z87412 Personal history of vulvar dysplasia: Secondary | ICD-10-CM | POA: Diagnosis not present

## 2021-05-26 DIAGNOSIS — Z888 Allergy status to other drugs, medicaments and biological substances status: Secondary | ICD-10-CM

## 2021-05-26 DIAGNOSIS — Z8249 Family history of ischemic heart disease and other diseases of the circulatory system: Secondary | ICD-10-CM | POA: Diagnosis not present

## 2021-05-26 DIAGNOSIS — Z20822 Contact with and (suspected) exposure to covid-19: Secondary | ICD-10-CM | POA: Diagnosis present

## 2021-05-26 DIAGNOSIS — G4733 Obstructive sleep apnea (adult) (pediatric): Secondary | ICD-10-CM | POA: Diagnosis present

## 2021-05-26 LAB — BASIC METABOLIC PANEL
Anion gap: 8 (ref 5–15)
Anion gap: 6 (ref 5–15)
BUN: 20 mg/dL (ref 6–20)
BUN: 18 mg/dL (ref 6–20)
CO2: 23 mmol/L (ref 22–32)
CO2: 23 mmol/L (ref 22–32)
Calcium: 9.4 mg/dL (ref 8.9–10.3)
Calcium: 8.7 mg/dL — ABNORMAL LOW (ref 8.9–10.3)
Chloride: 105 mmol/L (ref 98–111)
Chloride: 105 mmol/L (ref 98–111)
Creatinine, Ser: 0.98 mg/dL (ref 0.44–1.00)
Creatinine, Ser: 0.9 mg/dL (ref 0.44–1.00)
GFR, Estimated: >60 mL/min (ref >60)
GFR, Estimated: >60 mL/min (ref >60)
Glucose, Bld: 149 mg/dL — ABNORMAL HIGH (ref 70–99)
Glucose, Bld: 113 mg/dL — ABNORMAL HIGH (ref 70–99)
Potassium: 3.7 mmol/L (ref 3.5–5.1)
Potassium: 4.4 mmol/L (ref 3.5–5.1)
Sodium: 136 mmol/L (ref 135–145)
Sodium: 134 mmol/L — ABNORMAL LOW (ref 135–145)

## 2021-05-26 LAB — MAGNESIUM: Magnesium: 1.9 mg/dL (ref 1.7–2.4)

## 2021-05-26 MED ORDER — MAGNESIUM SULFATE 2 GM/50ML IV SOLN
2.0000 g | Freq: Once | INTRAVENOUS | Status: AC
  Administered 2021-05-26: 2 g via INTRAVENOUS
  Filled 2021-05-26: qty 50

## 2021-05-26 MED ORDER — SODIUM CHLORIDE 0.9% FLUSH: 3.0000 mL | INTRAVENOUS | Status: DC | PRN

## 2021-05-26 MED ORDER — METOPROLOL SUCCINATE ER 100 MG PO TB24
100.0000 mg | ORAL_TABLET | Freq: Every day | ORAL | Status: DC
  Administered 2021-05-27 – 2021-05-29 (×3): 100 mg via ORAL
  Filled 2021-05-26 (×3): qty 1

## 2021-05-26 MED ORDER — SODIUM CHLORIDE 0.9% FLUSH
3.0000 mL | Freq: Two times a day (BID) | INTRAVENOUS | Status: DC
  Administered 2021-05-26 – 2021-05-29 (×4): 3 mL via INTRAVENOUS

## 2021-05-26 MED ORDER — SODIUM CHLORIDE 0.9 % IV SOLN: 250.0000 mL | INTRAVENOUS | Status: DC | PRN

## 2021-05-26 MED ORDER — POTASSIUM CHLORIDE CRYS ER 20 MEQ PO TBCR: 40.0000 meq | EXTENDED_RELEASE_TABLET | Freq: Once | ORAL | Status: DC

## 2021-05-26 MED ORDER — APIXABAN 5 MG PO TABS
5.0000 mg | ORAL_TABLET | Freq: Two times a day (BID) | ORAL | Status: DC
  Administered 2021-05-26 – 2021-05-29 (×6): 5 mg via ORAL
  Filled 2021-05-26 (×6): qty 1

## 2021-05-26 MED ORDER — DOFETILIDE 500 MCG PO CAPS
500.0000 ug | ORAL_CAPSULE | Freq: Two times a day (BID) | ORAL | Status: DC
  Administered 2021-05-26 – 2021-05-29 (×6): 500 ug via ORAL
  Filled 2021-05-26 (×6): qty 1

## 2021-05-26 MED ORDER — POTASSIUM CHLORIDE CRYS ER 20 MEQ PO TBCR
60.0000 meq | EXTENDED_RELEASE_TABLET | Freq: Once | ORAL | Status: AC
  Administered 2021-05-26: 60 meq via ORAL
  Filled 2021-05-26: qty 3

## 2021-05-26 MED ORDER — NICOTINE 14 MG/24HR TD PT24
14.0000 mg | MEDICATED_PATCH | Freq: Every day | TRANSDERMAL | Status: DC
  Administered 2021-05-26 – 2021-05-29 (×4): 14 mg via TRANSDERMAL
  Filled 2021-05-26 (×4): qty 1

## 2021-05-26 NOTE — TOC Benefit Eligibility Note (Signed)
Patient Teacher, English as a foreign language completed.    The patient is currently admitted and upon discharge could be taking dofetilide (Tikosyn) 500 mcg.  The current 30 day co-pay is, $10.00.   The patient is insured through [employer], [place] Patient Advocate Specialist [place] Patient Advocate Team Direct Number: [phone]  Fax: [phone]

## 2021-05-26 NOTE — Progress Notes (Addendum)
Primary Care Physician: [facility] [place] Referring Physician: Robbie Lis Cardiologist: Dr. Payton Doughty is a 61 y.o. 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  She was found to have persistent afib/flutter and was on Cardizem IV, transitioned to oral prior to d/c. She was on heparin for a while perioperatively but transitioned back to eliquis prior to d/c. She was also positive for Covid on admission but asymptomatic and has had all vaccines.   She was seen by Robbie Lis, PA, and still had RVR in the 120's and he added low dose BB to her daily Cardizem. However, pt misunderstood and stopped her CCB, still in atrial flutter(typical) today at 127 bpm. She states that she does not feel her heart rhythm and is tolerating this well. She expects to have a reverse colostomy in 6 months.   F/u in the afib clinic, 07/26/20. At time of cardioversion , she went from atrail flutter with 3-4 AVB, to afib with RVR at 160 bpm. She eas cardioverted 2 additional times but did not convert. She was given esmolol per anesthesiology and  was d/c in afib in the 90's. The ekg shows today that she remains in afib at 65 bpm.she states that the afib is not bothering her and she feels well. She is pending having her colonoscopy reversed in June. She continues on BB/CCB for heart rate control.   F/u in afib clinic, 03/04/21. Initially, she thought that she may have  colostomy reversed in June, however this did not occur. She was admitted in June with abdominal pain and terminal ileitis. She asked for this appointment as she has been feeling more short of breath. In SR with controlled v rate today. She mentions now that reversal may not be until January if at all, as the surgeon told her she has a lot of scar tissue. She would like to try  to restore SR. She has not noted any fluid retention. Her weight is up 12 lbs. She feels this represents true weight gain. She has cut back smoking to one pack a week. When she was in the hospital in June, she was told that she needed a sleep study as she had snoring and  desaturation in her O2 with sleep.   04/16/21, Pt is here to f/u up on echo that showed moderately reduced EF at 30-35%. I  discussed with Dr. Tamala Julian and he thought probably [facility] and would try to restore sinus rhythm to improve EF, medication adjustment to address EFdysfunction and after restoring SR, he will consider further if ischemic w/u is needed. Weight is up a few lbs, but she states not fluid. I cannot appreciate any fluid and pt states that her diet is terrible. She has cut back but continues to smoke. She actually states that her breathing is improved as compared when she asked to be seen in October. She is agreeable to coming into hospital for Tikosyn but wants to wait until early January.   Follow up in the AF clinic 05/26/21. Patient presents for dofetilide admission. She denies any missed doses of anticoagulation in the last 3 weeks.   Today, she denies symptoms of palpitations, chest pain, shortness of breath, orthopnea, PND, lower extremity edema, dizziness, presyncope, syncope, or neurologic sequela. The patient is tolerating medications without difficulties and is  otherwise without complaint today.   Past Medical History:  Diagnosis Date   Dysrhythmia    History of COVID-19    PAF (paroxysmal atrial fibrillation) ([place])    Tobacco abuse    Past Surgical History:  Procedure Laterality Date   ABDOMINAL HYSTERECTOMY     CARDIOVERSION N/A 07/22/2020   Procedure: CARDIOVERSION;  Surgeon: [name], MD;  Location: [facility];  Service: Cardiovascular;  Laterality: N/A;   CHOLECYSTECTOMY N/A 08/18/2016   Procedure: LAPAROSCOPIC CHOLECYSTECTOMY WITH  INTRAOPERATIVE CHOLANGIOGRAM;  Surgeon: Johnathan Hausen, MD;   Location: [facility];  Service: General;  Laterality: N/A;   COLECTOMY WITH COLOSTOMY CREATION/HARTMANN PROCEDURE N/A 06/18/2020   Procedure: SIGMOID COLECTOMY WITH COLOSTOMY CREATION; DRAINAGE OF PELVIC ABSCESS;  Surgeon: Rolm Bookbinder, MD;  Location: [facility];  Service: General;  Laterality: N/A;   CYSTOSCOPY WITH STENT PLACEMENT Bilateral 06/18/2020   Procedure: CYSTOSCOPY WITH STENT PLACEMENT;  Surgeon: Alexis Frock, MD;  Location: [facility];  Service: Urology;  Laterality: Bilateral;   LAPAROTOMY N/A 06/18/2020   Procedure: EXPLORATORY LAPAROTOMY;  Surgeon: Rolm Bookbinder, MD;  Location: [facility];  Service: General;  Laterality: N/A;    Current Outpatient Medications  Medication Sig Dispense Refill   apixaban (ELIQUIS) 5 MG TABS tablet Take 1 tablet (5 mg total) by mouth 2 (two) times daily. 180 tablet 1   metoprolol succinate (TOPROL XL) 100 MG 24 hr tablet Take 1 tablet (100 mg total) by mouth daily. Take with or immediately following a meal. 30 tablet 3   Multiple Vitamin (MULTIVITAMIN) capsule Take 1 capsule by mouth daily. 50 plus     nicotine (NICODERM CQ - DOSED IN MG/24 HOURS) 14 mg/24hr patch Place 1 patch (14 mg total) onto the skin daily. (Patient not taking: Reported on 03/04/2021) 28 patch 0   No current facility-administered medications for this encounter.    Allergies  Allergen Reactions   Heparin Other (See Comments)    06/23/20 HIT AB ODT =3 very high likelihood of true HIT; 2/7 SRA negative    Social History   Socioeconomic History   Marital status: Single    Spouse name: Not on file   Number of children: Not on file   Years of education: Not on file   Highest education level: Not on file  Occupational History   Not on file  Tobacco Use   Smoking status: Every Day    Types: Cigarettes   Smokeless tobacco: Never  Vaping Use   Vaping Use: Never used  Substance and Sexual Activity   Alcohol use: No   Drug use: Yes    Types: Marijuana    Comment: last used in 6  months   Sexual activity: Never  Other Topics Concern   Not on file  Social History Narrative   Not on file   Social Determinants of Health   Financial Resource Strain: Not on file  Food Insecurity: Not on file  Transportation Needs: Not on file  Physical Activity: Not on file  Stress: Not on file  Social Connections: Not on file  Intimate Partner Violence: Not on file    Family History  Problem Relation Age of Onset   Heart failure Mother    Breast cancer Mother    Heart failure Father    Diabetes Father    Microcephaly Father    Colon cancer Neg Hx    Stomach cancer Neg Hx    Esophageal cancer Neg Hx    Pancreatic cancer Neg Hx  Liver disease Neg Hx     ROS- All systems are reviewed and negative except as per the HPI above  Physical Exam: Vitals:   05/26/21 1138  Height: 5\' 8"  (1.727 m)   Wt Readings from Last 3 Encounters:  04/16/21 109.7 kg  03/04/21 107.8 kg  02/22/21 104.3 kg    Labs: Lab Results  Component Value Date   NA 138 04/16/2021   K 4.2 04/16/2021   CL 108 04/16/2021   CO2 23 04/16/2021   GLUCOSE 98 04/16/2021   BUN 13 04/16/2021   CREATININE 0.79 04/16/2021   CALCIUM 9.7 04/16/2021   PHOS 3.5 06/20/2020   MG 2.0 04/16/2021   Lab Results  Component Value Date   INR 1.03 06/09/2017   Lab Results  Component Value Date   CHOL 173 06/08/2017   HDL 40 (L) 06/08/2017   LDLCALC 110 (H) 06/08/2017   TRIG 124 06/22/2020   GEN- The patient is a well appearing obese female, alert and oriented x 3 today.   HEENT-head normocephalic, atraumatic, sclera clear, conjunctiva pink, hearing intact, trachea midline. Lungs- Clear to ausculation bilaterally, normal work of breathing Heart- irregular rate and rhythm, no murmurs, rubs or gallops  GI- soft, NT, ND, + BS Extremities- no clubbing, cyanosis, or edema MS- no significant deformity or atrophy Skin- no rash or lesion Psych- euthymic mood, full affect Neuro- strength and sensation are  intact   EKG- afib, LBBB Vent. rate 107 BPM PR interval * ms QRS duration 152 ms QT/QTcB 376/501 ms   Echo- 1. Left ventricular ejection fraction, by estimation, is 30 to 35%. The  left ventricle has moderately decreased function. The left ventricle  demonstrates global hypokinesis. The left ventricular internal cavity size  was severely dilated. There is mild  left ventricular hypertrophy. Left ventricular diastolic parameters are  indeterminate.   2. Right ventricular systolic function is normal. The right ventricular  size is normal. Tricuspid regurgitation signal is inadequate for assessing  PA pressure.   3. Left atrial size was mildly dilated.   4. The mitral valve is normal in structure. No evidence of mitral valve  regurgitation.   5. The aortic valve is tricuspid. Aortic valve regurgitation is not  visualized. No aortic stenosis is present.   6. The inferior vena cava is dilated in size with >50% respiratory  variability, suggesting right atrial pressure of 8 mmHg.   FINDINGS   Left Ventricle: Left ventricular ejection fraction, by estimation, is 30  to 35%. The left ventricle has moderately decreased function. The left  ventricle demonstrates global hypokinesis. The left ventricular internal  cavity size was severely dilated.  There is mild left ventricular hypertrophy. Left ventricular diastolic  parameters are indeterminate.   Right Ventricle: The right ventricular size is normal. Right vetricular  wall thickness was not well visualized. Right ventricular systolic  function is normal. Tricuspid regurgitation signal is inadequate for  assessing PA pressure.              Assessment and Plan: 1. Persistent Afib/flutter Patient presents for dofetilide admission. Patient aware of price of dofetilide. Continue Eliquis 5 mg BID, states no missed doses in the last 3 weeks. No recent benadryl use PharmD has screened medications Labs today show creatinine at  0.98, K+ 3.7 and mag 1.9, CrCl calculated at 104 mL/min Continue Toprol 100 mg daily Continue Eliquis 5 mg BID  2. LV dysfunction EF 30-35% No signs or symptoms of fluid overload. Hopefully this will improve with  SR.  3. CHA2DS2VASc score of 3 Continue eliquis 5 mg bid   4.HTN Stable, no changes today.  5. Obesity  Body mass index is 36.77 kg/m. Lifestyle modification was discussed and encouraged including regular physical activity and weight reduction.  6. OSA CPAP titration pending.   To be admitted later today once a bed becomes available.    [facility] [address] [place], [place] [address] [phone]

## 2021-05-26 NOTE — Plan of Care (Signed)
  Problem: Education: Goal: Knowledge of disease or condition will improve Outcome: Progressing Goal: Understanding of medication regimen will improve Outcome: Progressing Goal: Individualized Educational Video(s) Outcome: Progressing   

## 2021-05-26 NOTE — Care Management (Signed)
05-26-21 1445 Patient presented for Tikosyn Load. Benefits check submitted. Case Manager will discuss co pay cost and pharmacy of choice with the patient.

## 2021-05-26 NOTE — Progress Notes (Addendum)
Pharmacy: Dofetilide (Tikosyn) - Initial Consult Assessment and Electrolyte Replacement  Pharmacy consulted to assist in monitoring and replacing electrolytes in this 61 y.o. female admitted on 05/26/2021 undergoing dofetilide initiation.  Assessment:  Patient Exclusion Criteria: If any screening criteria checked as "Yes", then  patient  should NOT receive dofetilide until criteria item is corrected.  If Yes please indicate correction plan.  YES  NO Patient  Exclusion Criteria Correction Plan   [x]   []   Baseline QTc interval is greater than or equal to 440 msec. IF above YES box checked dofetilide contraindicated unless patient has ICD; then may proceed if QTc 500-550 msec or with known ventricular conduction abnormalities may proceed with QTc 550-600 msec. QTc = 495 Continue Tikosyn with close monitoring   []   [x]   Patient is known or suspected to have a digoxin level greater than 2 ng/ml: No results found for: DIGOXIN     []   [x]   Creatinine clearance less than 20 ml/min (calculated using Cockcroft-Gault, actual body weight and serum creatinine): Estimated Creatinine Clearance: 78.8 mL/min (by C-G formula based on SCr of 0.98 mg/dL).     []   [x]  Patient has received drugs known to prolong the QT intervals within the last 48 hours (phenothiazines, tricyclics or tetracyclic antidepressants, erythromycin, H-1 antihistamines, cisapride, fluoroquinolones, azithromycin, ondansetron).   Updated information on QT prolonging agents is available to be searched on the following database:QT prolonging agents     []   [x]   Patient received a dose of hydrochlorothiazide (Oretic) alone or in any combination including triamterene (Dyazide, Maxzide) in the last 48 hours.    []   [x]  Patient received a medication known to increase dofetilide plasma concentrations prior to initial dofetilide dose:  Trimethoprim (Primsol, Proloprim) in the last 36 hours Verapamil (Calan, Verelan) in the last 36  hours or a sustained release dose in the last 72 hours Megestrol (Megace) in the last 5 days  Cimetidine (Tagamet) in the last 6 hours Ketoconazole (Nizoral) in the last 24 hours Itraconazole (Sporanox) in the last 48 hours  Prochlorperazine (Compazine) in the last 36 hours     []   [x]   Patient is known to have a history of torsades de pointes; congenital or acquired long QT syndromes.    []   [x]   Patient has received a Class 1 antiarrhythmic with less than 2 half-lives since last dose. (Disopyramide, Quinidine, Procainamide, Lidocaine, Mexiletine, Flecainide, Propafenone)    []   [x]   Patient has received amiodarone therapy in the past 3 months or amiodarone level is greater than 0.3 ng/ml.    Patient has been appropriately anticoagulated with apixaban.  Labs:    Component Value Date/Time   K 3.7 05/26/2021 1202   MG 1.9 05/26/2021 1202     Plan: Potassium: K 3.5-3.7:  Hold Tikosyn initiation and give KCl 60 mEq po x1 and repeat BMET 2hr after dose - repeat appropriate dose if K < 4    Magnesium: Mg 1.8-2: Give Mg 2 gm IV x1 to prevent Mg from dropping below 1.8 - do not need to recheck Mg. Appropriate to initiate Tikosyn   Thank you for allowing pharmacy to participate in this patient's care   Hildred Laser, PharmD Clinical Pharmacist **Pharmacist phone directory can now be found on [email or web address] ([email or web address]).  Listed under [place].

## 2021-05-26 NOTE — H&P (Addendum)
Electrophysiology H&P  Note   Primary Care Physician: [facility], [place] Referring Physician: Robbie Lis Cardiologist: Dr. Payton Doughty is a 61 y.o. 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  She was found to have persistent afib/flutter and was on Cardizem IV, transitioned to oral prior to d/c. She was on heparin for a while perioperatively but transitioned back to eliquis prior to d/c. She was also positive for Covid on admission but asymptomatic and has had all vaccines.   She was seen by Robbie Lis, PA, and still had RVR in the 120's and he added low dose BB to her daily Cardizem. However, pt misunderstood and stopped her CCB, still in atrial flutter(typical) today at 127 bpm. She states that she does not feel her heart rhythm and is tolerating this well. She expects to have a reverse colostomy in 6 months.   F/u in the afib clinic, 07/26/20. At time of cardioversion , she went from atrail flutter with 3-4 AVB, to afib with RVR at 160 bpm. She eas cardioverted 2 additional times but did not convert. She was given esmolol per anesthesiology and  was d/c in afib in the 90's. The ekg shows today that she remains in afib at 107 bpm.she states that the afib is not bothering her and she feels well. She is pending having her colonoscopy reversed in June. She continues on BB/CCB for heart rate control.   F/u in afib clinic, 03/04/21. Initially, she thought that she may have  colostomy reversed in June, however this did not occur. She was admitted in June with abdominal pain and terminal ileitis. She asked for this appointment as she has been feeling more short of breath. In SR with controlled v rate today. She mentions now that reversal may not be until January if at all, as the surgeon told her she has a lot of  scar tissue. She would like to try to restore SR. She has not noted any fluid retention. Her weight is up 12 lbs. She feels this represents true weight gain. She has cut back smoking to one pack a week. When she was in the hospital in June, she was told that she needed a sleep study as she had snoring and  desaturation in her O2 with sleep.   04/16/21, Pt is here to f/u up on echo that showed moderately reduced EF at 30-35%. I  discussed with Dr. Tamala Julian and he thought probably [facility] and would try to restore sinus rhythm to improve EF, medication adjustment to address EFdysfunction and after restoring SR, he will consider further if ischemic w/u is needed. Weight is up a few lbs, but she states not fluid. I cannot appreciate any fluid and pt states that her diet is terrible. She has cut back but continues to smoke. She actually states that her breathing is improved as compared when she asked to be seen in October. She is agreeable to coming into hospital for Tikosyn but wants to wait until early January.   Follow up in the AF clinic 05/26/21. Patient presents for dofetilide admission. She denies any missed doses of anticoagulation in the last 3 weeks.   Today, she denies symptoms of palpitations, chest pain, shortness of breath, orthopnea, PND, lower extremity edema, dizziness, presyncope, syncope, or neurologic sequela. The patient is tolerating  medications without difficulties and is otherwise without complaint today.   Past Medical History:  Diagnosis Date   Dysrhythmia    History of COVID-19    PAF (paroxysmal atrial fibrillation) ([place])    Tobacco abuse    Past Surgical History:  Procedure Laterality Date   ABDOMINAL HYSTERECTOMY     CARDIOVERSION N/A 07/22/2020   Procedure: CARDIOVERSION;  Surgeon: [name], MD;  Location: [facility] [place] [facility];  Service: Cardiovascular;  Laterality: N/A;   CHOLECYSTECTOMY N/A 08/18/2016   Procedure: LAPAROSCOPIC CHOLECYSTECTOMY WITH  INTRAOPERATIVE CHOLANGIOGRAM;   Surgeon: Johnathan Hausen, MD;  Location: [facility];  Service: General;  Laterality: N/A;   COLECTOMY WITH COLOSTOMY CREATION/HARTMANN PROCEDURE N/A 06/18/2020   Procedure: SIGMOID COLECTOMY WITH COLOSTOMY CREATION; DRAINAGE OF PELVIC ABSCESS;  Surgeon: Rolm Bookbinder, MD;  Location: [place];  Service: General;  Laterality: N/A;   CYSTOSCOPY WITH STENT PLACEMENT Bilateral 06/18/2020   Procedure: CYSTOSCOPY WITH STENT PLACEMENT;  Surgeon: Alexis Frock, MD;  Location: [place];  Service: Urology;  Laterality: Bilateral;   LAPAROTOMY N/A 06/18/2020   Procedure: EXPLORATORY LAPAROTOMY;  Surgeon: Rolm Bookbinder, MD;  Location: [place];  Service: General;  Laterality: N/A;    Current Facility-Administered Medications  Medication Dose Route Frequency Provider Last Rate Last Admin   0.9 %  sodium chloride infusion  250 mL Intravenous PRN Fenton, Clint R, PA       apixaban (ELIQUIS) tablet 5 mg  5 mg Oral BID Fenton, Clint R, PA       dofetilide (TIKOSYN) capsule 500 mcg  500 mcg Oral BID Fenton, Clint R, PA       [START ON 05/27/2021] metoprolol succinate (TOPROL-XL) 24 hr tablet 100 mg  100 mg Oral Daily Fenton, Clint R, PA       sodium chloride flush (NS) 0.9 % injection 3 mL  3 mL Intravenous Q12H Fenton, Clint R, PA       sodium chloride flush (NS) 0.9 % injection 3 mL  3 mL Intravenous PRN Fenton, Clint R, PA        Allergies  Allergen Reactions   Heparin Other (See Comments)    06/23/20 HIT AB ODT =3 very high likelihood of true HIT; 2/7 SRA negative    Social History   Socioeconomic History   Marital status: Single    Spouse name: Not on file   Number of children: Not on file   Years of education: Not on file   Highest education level: Not on file  Occupational History   Not on file  Tobacco Use   Smoking status: Every Day    Types: Cigarettes   Smokeless tobacco: Never  Vaping Use   Vaping Use: Never used  Substance and Sexual Activity   Alcohol use: No   Drug use: Yes    Types:  Marijuana    Comment: last used in 6 months   Sexual activity: Never  Other Topics Concern   Not on file  Social History Narrative   Not on file   Social Determinants of Health   Financial Resource Strain: Not on file  Food Insecurity: Not on file  Transportation Needs: Not on file  Physical Activity: Not on file  Stress: Not on file  Social Connections: Not on file  Intimate Partner Violence: Not on file    Family History  Problem Relation Age of Onset   Heart failure Mother    Breast cancer Mother    Heart failure Father    Diabetes Father  Microcephaly Father    Colon cancer Neg Hx    Stomach cancer Neg Hx    Esophageal cancer Neg Hx    Pancreatic cancer Neg Hx    Liver disease Neg Hx     ROS- All systems are reviewed and negative except as per the HPI above  Physical Exam: Vitals:   05/26/21 1400  Temp: 98.8 F (37.1 C)  TempSrc: Oral  Weight: 108.6 kg  Height: 5\' 8"  (1.727 m)   Wt Readings from Last 3 Encounters:  05/26/21 108.6 kg  05/26/21 108.4 kg  04/16/21 109.7 kg    Labs: Lab Results  Component Value Date   NA 136 05/26/2021   K 3.7 05/26/2021   CL 105 05/26/2021   CO2 23 05/26/2021   GLUCOSE 149 (H) 05/26/2021   BUN 20 05/26/2021   CREATININE 0.98 05/26/2021   CALCIUM 9.4 05/26/2021   PHOS 3.5 06/20/2020   MG 1.9 05/26/2021   Lab Results  Component Value Date   INR 1.03 06/09/2017   Lab Results  Component Value Date   CHOL 173 06/08/2017   HDL 40 (L) 06/08/2017   LDLCALC 110 (H) 06/08/2017   TRIG 124 06/22/2020   GEN- The patient is a well appearing obese female, alert and oriented x 3 today.   HEENT-head normocephalic, atraumatic, sclera clear, conjunctiva pink, hearing intact, trachea midline. Lungs- Clear to ausculation bilaterally, normal work of breathing Heart- irregular rate and rhythm, no murmurs, rubs or gallops  GI- soft, NT, ND, + BS Extremities- no clubbing, cyanosis, or edema MS- no significant deformity or  atrophy Skin- no rash or lesion Psych- euthymic mood, full affect Neuro- strength and sensation are intact   EKG- afib, LBBB Vent. rate 107 BPM PR interval * ms QRS duration 152 ms QT/QTcB 376/501 ms   Echo- 1. Left ventricular ejection fraction, by estimation, is 30 to 35%. The  left ventricle has moderately decreased function. The left ventricle  demonstrates global hypokinesis. The left ventricular internal cavity size  was severely dilated. There is mild  left ventricular hypertrophy. Left ventricular diastolic parameters are  indeterminate.   2. Right ventricular systolic function is normal. The right ventricular  size is normal. Tricuspid regurgitation signal is inadequate for assessing  PA pressure.   3. Left atrial size was mildly dilated.   4. The mitral valve is normal in structure. No evidence of mitral valve  regurgitation.   5. The aortic valve is tricuspid. Aortic valve regurgitation is not  visualized. No aortic stenosis is present.   6. The inferior vena cava is dilated in size with >50% respiratory  variability, suggesting right atrial pressure of 8 mmHg.   FINDINGS   Left Ventricle: Left ventricular ejection fraction, by estimation, is 30  to 35%. The left ventricle has moderately decreased function. The left  ventricle demonstrates global hypokinesis. The left ventricular internal  cavity size was severely dilated.  There is mild left ventricular hypertrophy. Left ventricular diastolic  parameters are indeterminate.   Right Ventricle: The right ventricular size is normal. Right vetricular  wall thickness was not well visualized. Right ventricular systolic  function is normal. Tricuspid regurgitation signal is inadequate for  assessing PA pressure.              Assessment and Plan: 1. Persistent Afib/flutter Patient presents for dofetilide admission. Patient aware of price of dofetilide. Continue Eliquis 5 mg BID, states no missed doses in the  last 3 weeks. No recent benadryl use PharmD  has screened medications Labs today show creatinine at 0.98, K+ 3.7 and mag 1.9, CrCl calculated at 104 mL/min Continue Toprol 100 mg daily Continue Eliquis 5 mg BID  2. LV dysfunction EF 30-35% No signs or symptoms of fluid overload. Hopefully this will improve with SR.  3. CHA2DS2VASc score of 3 Continue eliquis 5 mg bid   4.HTN Stable, no changes today.  5. Obesity  Body mass index is 36.77 kg/m. Lifestyle modification was discussed and encouraged including regular physical activity and weight reduction.  6. OSA CPAP titration pending.  7. Tobacco abuse Pt request nicotine patch.   Legrand Como [address]" [place], PA-C  05/26/2021 2:42 PM

## 2021-05-26 NOTE — Telephone Encounter (Signed)
Tikosyn admission has been approved.  Date of service: 05/26/21 Approval # [id number] Call back # [phone]

## 2021-05-27 DIAGNOSIS — I4819 Other persistent atrial fibrillation: Secondary | ICD-10-CM | POA: Diagnosis not present

## 2021-05-27 LAB — BASIC METABOLIC PANEL
Anion gap: 8 (ref 5–15)
BUN: 20 mg/dL (ref 6–20)
CO2: 24 mmol/L (ref 22–32)
Calcium: 8.7 mg/dL — ABNORMAL LOW (ref 8.9–10.3)
Chloride: 106 mmol/L (ref 98–111)
Creatinine, Ser: 0.89 mg/dL (ref 0.44–1.00)
GFR, Estimated: >60 mL/min (ref >60)
Glucose, Bld: 95 mg/dL (ref 70–99)
Potassium: 4.7 mmol/L (ref 3.5–5.1)
Sodium: 138 mmol/L (ref 135–145)

## 2021-05-27 LAB — MAGNESIUM: Magnesium: 2.3 mg/dL (ref 1.7–2.4)

## 2021-05-27 LAB — PROTIME-INR
INR: 1.1 (ref 0.8–1.2)
Prothrombin Time: 14.3 seconds (ref 11.4–15.2)

## 2021-05-27 MED ORDER — ACETAMINOPHEN 500 MG PO TABS
1000.0000 mg | ORAL_TABLET | Freq: Four times a day (QID) | ORAL | Status: DC | PRN
  Administered 2021-05-27 – 2021-05-29 (×3): 1000 mg via ORAL
  Filled 2021-05-27 (×4): qty 2

## 2021-05-27 MED ORDER — SODIUM CHLORIDE 0.9 % IV SOLN: INTRAVENOUS | Status: DC

## 2021-05-27 MED ORDER — LOSARTAN POTASSIUM 25 MG PO TABS
25.0000 mg | ORAL_TABLET | Freq: Every day | ORAL | Status: DC
Start: 2021-05-27 — End: 2021-05-29
  Administered 2021-05-27 – 2021-05-29 (×3): 25 mg via ORAL
  Filled 2021-05-27 (×3): qty 1

## 2021-05-27 NOTE — Progress Notes (Signed)
Morning EKG reviewed    Shows pt remains in afib at 90 bpm with stable QTc at ~470 ms when measured manually and corrected for QRS  Continue  Tikosyn 500 mcg BID.   Pt will be NPO after midnight for DCCV if remains in [place], [place]  Pager: [phone]  05/27/2021 11:47 AM

## 2021-05-27 NOTE — Progress Notes (Signed)
Pharmacy: Dofetilide (Tikosyn) - Follow Up Assessment and Electrolyte Replacement  Pharmacy consulted to assist in monitoring and replacing electrolytes in this 61 y.o. female admitted on 05/26/2021 undergoing dofetilide initiation.  Labs:    Component Value Date/Time   K 4.7 05/27/2021 0047   MG 2.3 05/27/2021 0047     Plan: Potassium: K >/= 4: No additional supplementation needed  Magnesium: Mg > 2: No additional supplementation needed   Thank you for allowing pharmacy to participate in this patient's care   Hildred Laser, PharmD Clinical Pharmacist **Pharmacist phone directory can now be found on [email or web address] (PW [email or web address]).  Listed under [place].

## 2021-05-27 NOTE — Progress Notes (Signed)
Electrophysiology Rounding Note  Patient Name: Tammy Davies Date of Encounter: 05/27/2021  Primary Cardiologist: Sinclair Grooms, MD  Electrophysiologist: New to Dr. Quentin Ore   Subjective   Pt remains in afib on Tikosyn 500 mcg BID   QTc from EKG last pm shows stable QTc at ~460-470 when measured manually  The patient is doing well today.  At this time, the patient denies chest pain, shortness of breath, or any new concerns.  Inpatient Medications    Scheduled Meds:  apixaban  5 mg Oral BID   dofetilide  500 mcg Oral BID   metoprolol succinate  100 mg Oral Daily   nicotine  14 mg Transdermal Daily   sodium chloride flush  3 mL Intravenous Q12H   Continuous Infusions:  sodium chloride     PRN Meds: sodium chloride, sodium chloride flush   Vital Signs    Vitals:   05/26/21 1400 05/26/21 2010 05/26/21 2357 05/27/21 0406  BP:  100/72 101/71 121/78  Pulse:  80 95 68  Temp: 98.8 F (37.1 C) 97.9 F (36.6 C) 97.8 F (36.6 C) 97.8 F (36.6 C)  TempSrc: Oral Oral Oral Oral  SpO2:    95%  Weight: 108.6 kg     Height: 5\' 8"  (1.727 m)       Intake/Output Summary (Last 24 hours) at 05/27/2021 0703 Last data filed at 05/26/2021 2040 Gross per 24 hour  Intake 147.31 ml  Output --  Net 147.31 ml   Filed Weights   05/26/21 1400  Weight: 108.6 kg    Physical Exam    GEN- The patient is well appearing, alert and oriented x 3 today.   Head- normocephalic, atraumatic Eyes-  Sclera clear, conjunctiva pink Ears- hearing intact Oropharynx- clear Neck- supple Lungs- Clear to ausculation bilaterally, normal work of breathing Heart- Irregularly irregular rate and rhythm, no murmurs, rubs or gallops GI- soft, NT, ND, + BS Extremities- no clubbing, cyanosis, or edema Skin- no rash or lesion Psych- euthymic mood, full affect Neuro- strength and sensation are intact  Labs    CBC No results for input(s): WBC, NEUTROABS, HGB, HCT, MCV, PLT in the last 72  hours. Basic Metabolic Panel Recent Labs    05/26/21 1202 05/26/21 1754 05/27/21 0047  NA 136 134* 138  K 3.7 4.4 4.7  CL 105 105 106  CO2 23 23 24   GLUCOSE 149* 113* 95  BUN 20 18 20   CREATININE 0.98 0.90 0.89  CALCIUM 9.4 8.7* 8.7*  MG 1.9  --  2.3    Potassium  Date/Time Value Ref Range Status  05/27/2021 12:47 AM 4.7 3.5 - 5.1 mmol/L Final   Magnesium  Date/Time Value Ref Range Status  05/27/2021 12:47 AM 2.3 1.7 - 2.4 mg/dL Final    Comment:    Performed at [facility], [place] [address] [place], [place] [address]    Telemetry    Atrial fibrillation 80-90s (personally reviewed)  Radiology    No results found.   Patient Profile     Tammy Davies is a 61 y.o. female with a past medical history significant for persistent atrial fibrillation.  They were admitted for tikosyn load.   Assessment & Plan    Persistent atrial fibrillation Pt remains in afib on Tikosyn 500 mcg BID  Continue Eliquis Electrolytes stable.  CHA2DS2VASC is at least 2.  2. Tobacco abuse Nicotine patch in place Cessation encouraged  If pt does not convert chemically, plan on DCCV  tomorrow   For questions or updates, please contact [place] Please consult [email or web address] for contact info under Cardiology/STEMI.  Signed, Shirley Friar, PA-C  05/27/2021, 7:03 AM

## 2021-05-28 ENCOUNTER — Encounter (HOSPITAL_COMMUNITY): Payer: Self-pay | Admitting: Internal Medicine

## 2021-05-28 ENCOUNTER — Encounter (HOSPITAL_COMMUNITY)
Admission: AD | Disposition: A | Discharge status: Home / Self Care | Payer: Self-pay | Source: Ambulatory Visit | Attending: Cardiology

## 2021-05-28 ENCOUNTER — Inpatient Hospital Stay (HOSPITAL_COMMUNITY): Payer: No Typology Code available for payment source | Admitting: Certified Registered Nurse Anesthetist

## 2021-05-28 ENCOUNTER — Other Ambulatory Visit (HOSPITAL_COMMUNITY): Payer: Self-pay

## 2021-05-28 DIAGNOSIS — I5022 Chronic systolic (congestive) heart failure: Secondary | ICD-10-CM | POA: Diagnosis not present

## 2021-05-28 DIAGNOSIS — I4819 Other persistent atrial fibrillation: Secondary | ICD-10-CM | POA: Diagnosis not present

## 2021-05-28 HISTORY — PX: CARDIOVERSION: SHX1299

## 2021-05-28 LAB — BASIC METABOLIC PANEL
Anion gap: 6 (ref 5–15)
BUN: 19 mg/dL (ref 6–20)
CO2: 26 mmol/L (ref 22–32)
Calcium: 8.9 mg/dL (ref 8.9–10.3)
Chloride: 105 mmol/L (ref 98–111)
Creatinine, Ser: 1.12 mg/dL — ABNORMAL HIGH (ref 0.44–1.00)
GFR, Estimated: 56 mL/min — ABNORMAL LOW (ref >60)
Glucose, Bld: 87 mg/dL (ref 70–99)
Potassium: 4.7 mmol/L (ref 3.5–5.1)
Sodium: 137 mmol/L (ref 135–145)

## 2021-05-28 LAB — MAGNESIUM: Magnesium: 2 mg/dL (ref 1.7–2.4)

## 2021-05-28 SURGERY — CARDIOVERSION
Anesthesia: General

## 2021-05-28 MED ORDER — PROPOFOL 10 MG/ML IV BOLUS
INTRAVENOUS | Status: DC | PRN
  Administered 2021-05-28: 100 mg via INTRAVENOUS
  Administered 2021-05-28 (×2): 25 mg via INTRAVENOUS

## 2021-05-28 MED ORDER — LIDOCAINE 2% (20 MG/ML) 5 ML SYRINGE
INTRAMUSCULAR | Status: DC | PRN
  Administered 2021-05-28: 20 mg via INTRAVENOUS

## 2021-05-28 MED ORDER — MAGNESIUM SULFATE 2 GM/50ML IV SOLN
2.0000 g | Freq: Once | INTRAVENOUS | Status: AC
  Administered 2021-05-28: 2 g via INTRAVENOUS
  Filled 2021-05-28: qty 50

## 2021-05-28 MED ORDER — PHENYLEPHRINE 40 MCG/ML (10ML) SYRINGE FOR IV PUSH (FOR BLOOD PRESSURE SUPPORT)
PREFILLED_SYRINGE | INTRAVENOUS | Status: DC | PRN
  Administered 2021-05-28: 40 ug via INTRAVENOUS

## 2021-05-28 NOTE — CV Procedure (Signed)
° °  DIRECT CURRENT CARDIOVERSION  NAME:  Tammy Davies    MRN: [id number] DOB:  1961/05/18    ADMIT DATE: 05/26/2021  Indication:  Symptomatic atrial fibrillation  Procedure Note:  The patient signed informed consent.  They have had had therapeutic anticoagulation with eliquis greater than 3 weeks.  Anesthesia was administered by Dr. Ola Spurr.  Adequate airway was maintained throughout and vital followed per protocol.  They were cardioverted x 2 with 200J of biphasic synchronized energy.  They converted to NSR.  There were no apparent complications.  The patient had normal neuro status and respiratory status post procedure with vitals stable as recorded elsewhere.    The patient was converted x1 and had NSR. Afib returned within 30 seconds. A second cardioversion was performed and NSR was maintained.   Follow up: They will continue on current medical therapy and follow up with cardiology as scheduled.  Lake Bells T. Audie Box, MD, [place]  [address], [place], [place] [address] [phone]  1:40 PM

## 2021-05-28 NOTE — H&P (View-Only) (Signed)
Morning EKG reviewed  (not yet scanned in)  Shows pt remains in afib with stable QTc < 500 ms when measured manually and corrected to QRS.  Continue  Tikosyn 500 mcg BID.   She is pending [facility] this afternoon.    Shirley Friar, [place]  Pager: [phone]  05/28/2021 12:07 PM

## 2021-05-28 NOTE — Care Management (Signed)
05-28-21 1649 Patient was asleep at the time of visit. Case Manager did call the [facility] and the dofetilide is available 500 mcg. Initial Rx dose can be filled via [facility] and Rx Refills sent to the above Pharmacy. No further needs identified at this time.

## 2021-05-28 NOTE — Interval H&P Note (Signed)
History and Physical Interval Note:  05/28/2021 1:22 PM  Tammy Davies  has presented today for surgery, with the diagnosis of afib.  The various methods of treatment have been discussed with the patient and family. After consideration of risks, benefits and other options for treatment, the patient has consented to  Procedure(s): CARDIOVERSION (N/A) as a surgical intervention.  The patient's history has been reviewed, patient examined, no change in status, stable for surgery.  I have reviewed the patient's chart and labs.  Questions were answered to the patient's satisfaction.    NPO for DCCV. Tikosyn loading in hospital. On eliquis. No missed doses.   [place] Bells T. Audie Box, MD, [place]  [address] [place], [place] [address] [phone]  1:22 PM

## 2021-05-28 NOTE — Anesthesia Preprocedure Evaluation (Signed)
Anesthesia Evaluation  Patient identified by MRN, date of birth, ID band Patient awake    Reviewed: Allergy & Precautions, NPO status , Patient's Chart, lab work & pertinent test results, reviewed documented beta blocker date and time   History of Anesthesia Complications Negative for: history of anesthetic complications  Airway Mallampati: II  TM Distance: >3 FB Neck ROM: Full    Dental  (+) Poor Dentition, Missing,    Pulmonary Current Smoker and Patient abstained from smoking.,    Pulmonary exam normal        Cardiovascular hypertension, Pt. on medications and Pt. on home beta blockers + dysrhythmias (on Eliquis) Atrial Fibrillation  Rhythm:Irregular     Neuro/Psych negative neurological ROS  negative psych ROS   GI/Hepatic negative GI ROS, Neg liver ROS,   Endo/Other  negative endocrine ROS  Renal/GU negative Renal ROS  negative genitourinary   Musculoskeletal negative musculoskeletal ROS (+)   Abdominal   Peds  Hematology negative hematology ROS (+)   Anesthesia Other Findings Day of surgery medications reviewed with patient.  Reproductive/Obstetrics negative OB ROS                             Anesthesia Physical  Anesthesia Plan  ASA: 3  Anesthesia Plan: General   Post-op Pain Management:    Induction: Intravenous  PONV Risk Score and Plan: Treatment may vary due to age or medical condition and Propofol infusion  Airway Management Planned: Mask and Natural Airway  Additional Equipment: None  Intra-op Plan:   Post-operative Plan:   Informed Consent: I have reviewed the patients History and Physical, chart, labs and discussed the procedure including the risks, benefits and alternatives for the proposed anesthesia with the patient or authorized representative who has indicated his/her understanding and acceptance.       Plan Discussed with: CRNA  Anesthesia Plan  Comments:         Anesthesia Quick Evaluation

## 2021-05-28 NOTE — Transfer of Care (Signed)
Immediate Anesthesia Transfer of Care Note  Patient: Tammy Davies  Procedure(s) Performed: CARDIOVERSION  Patient Location: PACU and Endoscopy Unit  Anesthesia Type:General  Level of Consciousness: drowsy and patient cooperative  Airway & Oxygen Therapy: Patient Spontanous Breathing and Patient connected to nasal cannula oxygen  Post-op Assessment: Report given to RN and Post -op Vital signs reviewed and stable  Post vital signs: Reviewed and stable  Last Vitals:  Vitals Value Taken Time  BP 113/82   Temp    Pulse 66   Resp 18   SpO2 98 on 6L O2 via [place]     Last Pain:  Vitals:   05/28/21 1258  TempSrc: Oral  PainSc: 0-No pain      Patients Stated Pain Goal: 0 (73/71/06 2694)  Complications: No notable events documented.

## 2021-05-28 NOTE — Anesthesia Procedure Notes (Signed)
Procedure Name: General with mask airway Date/Time: 05/28/2021 1:33 PM Performed by: Lowella Dell, CRNA Pre-anesthesia Checklist: Patient identified, Emergency Drugs available, Suction available, Patient being monitored and Timeout performed Patient Re-evaluated:Patient Re-evaluated prior to induction Oxygen Delivery Method: Ambu bag Preoxygenation: Pre-oxygenation with 100% oxygen Induction Type: IV induction Ventilation: Mask ventilation without difficulty Placement Confirmation: positive ETCO2 Dental Injury: Teeth and Oropharynx as per pre-operative assessment

## 2021-05-28 NOTE — Progress Notes (Signed)
°   05/28/21 2000  Clinical Encounter Type  Visited With Patient  Visit Type Initial  Referral From Nurse  Consult/Referral To Chaplain   Chaplain Jorene Guest responded to the consult request for an Advance Directive. Ike Bene provided education. The patient said she would appoint her son as her 30. The patient said she would inform the nurse when ready for notary. This note was prepared by Jeanine Luz, M.Div..  For questions please contact by phone [phone].

## 2021-05-28 NOTE — Progress Notes (Signed)
Pharmacy: Dofetilide (Tikosyn) - Follow Up Assessment and Electrolyte Replacement  Pharmacy consulted to assist in monitoring and replacing electrolytes in this 61 y.o. female admitted on 05/26/2021 undergoing dofetilide initiation.   Labs:    Component Value Date/Time   K 4.7 05/28/2021 0229   MG 2.0 05/28/2021 0229     Plan: Potassium: K >/= 4: No additional supplementation needed  Magnesium: Mg 1.8-2: Give Mg 2 gm IV x1    Thank you for allowing pharmacy to participate in this patient's care   Hildred Laser, PharmD Clinical Pharmacist **Pharmacist phone directory can now be found on [email or web address] (PW [email or web address]).  Listed under [place].

## 2021-05-28 NOTE — Progress Notes (Signed)
Morning EKG reviewed  (not yet scanned in)  Shows pt remains in afib with stable QTc < 500 ms when measured manually and corrected to QRS.  Continue  Tikosyn 500 mcg BID.   She is pending [facility] this afternoon.    Shirley Friar, [place]  Pager: [phone]  05/28/2021 12:07 PM

## 2021-05-28 NOTE — TOC Benefit Eligibility Note (Signed)
Patient Teacher, English as a foreign language completed.    The patient is currently admitted and upon discharge could be taking Jardiance 10 mg.  The current 30 day co-pay is, $90.00.   The patient is currently admitted and upon discharge could be taking Farxiga 10 mg.  Non Formulary  The patient is currently admitted and upon discharge could be taking Entresto 24-26 mg.  The current 30 day co-pay is, $120.00.   The patient is insured through [place], [place] Patient [place] Patient Advocate Team Direct Number: [phone]  Fax: [phone]

## 2021-05-28 NOTE — Progress Notes (Addendum)
Electrophysiology Rounding Note  Patient Name: Tammy Davies Date of Encounter: 05/28/2021  Primary Cardiologist: Sinclair Grooms, MD  Electrophysiologist: Dr. Quentin Ore   Subjective   Pt remains in afib on Tikosyn 500 mcg BID   QTc from EKG last pm shows stable QTc at ~460-470  The patient is doing well today.  At this time, the patient denies chest pain, shortness of breath, or any new concerns.  Inpatient Medications    Scheduled Meds:  apixaban  5 mg Oral BID   dofetilide  500 mcg Oral BID   losartan  25 mg Oral Daily   metoprolol succinate  100 mg Oral Daily   nicotine  14 mg Transdermal Daily   sodium chloride flush  3 mL Intravenous Q12H   Continuous Infusions:  sodium chloride     sodium chloride     PRN Meds: sodium chloride, acetaminophen, sodium chloride flush   Vital Signs    Vitals:   05/27/21 1444 05/27/21 1519 05/27/21 2044 05/28/21 0437  BP: 94/65 105/77 112/73 99/68  Pulse: 82   82  Resp: 17  18   Temp: 98.2 F (36.8 C)  97.9 F (36.6 C) 97.9 F (36.6 C)  TempSrc: Oral  Oral Oral  SpO2: 96%  94% 95%  Weight:      Height:        Intake/Output Summary (Last 24 hours) at 05/28/2021 0715 Last data filed at 05/27/2021 2030 Gross per 24 hour  Intake 1110 ml  Output --  Net 1110 ml   Filed Weights   05/26/21 1400  Weight: 108.6 kg    Physical Exam    GEN- The patient is well appearing, alert and oriented x 3 today.   Head- normocephalic, atraumatic Eyes-  Sclera clear, conjunctiva pink Ears- hearing intact Oropharynx- clear Neck- supple Lungs- Clear to ausculation bilaterally, normal work of breathing Heart- Irregularly irregular rate and rhythm, no murmurs, rubs or gallops GI- soft, NT, ND, + BS Extremities- no clubbing, cyanosis, or edema Skin- no rash or lesion Psych- euthymic mood, full affect Neuro- strength and sensation are intact  Labs    CBC No results for input(s): WBC, NEUTROABS, HGB, HCT, MCV, PLT in the  last 72 hours. Basic Metabolic Panel Recent Labs    05/27/21 0047 05/28/21 0229  NA 138 137  K 4.7 4.7  CL 106 105  CO2 24 26  GLUCOSE 95 87  BUN 20 19  CREATININE 0.89 1.12*  CALCIUM 8.7* 8.9  MG 2.3 2.0    Potassium  Date/Time Value Ref Range Status  05/28/2021 02:29 AM 4.7 3.5 - 5.1 mmol/L Final   Magnesium  Date/Time Value Ref Range Status  05/28/2021 02:29 AM 2.0 1.7 - 2.4 mg/dL Final    Comment:    Performed at [facility], [place] [address], [place], [place] [address]    Telemetry    AF 70-90s (personally reviewed)  Radiology    No results found.   Patient Profile     Tammy Davies is a 61 y.o. female with a past medical history significant for persistent atrial fibrillation.  They were admitted for tikosyn load.   Assessment & Plan    Persistent atrial fibrillation Pt remains in afib on Tikosyn 500 mcg BID  Continue Eliquis Electrolytes stable.  CHA2DS2VASC is at least 2.  Plan for [facility] this afternoon with Dr. Audie Box  2. Chronic systolic CHF  EF 94-49%, likely tachy-mediated No s/s fluid overload Started on  losartan here.  Hope this Saara Kijowski improve with NSR  3. Tobacco abuse Nicotine patch in use.  Encouraged cessation   For questions or updates, please contact [place] Please consult [email or web address] for contact info under Cardiology/STEMI.  Signed, Shirley Friar, PA-C  05/28/2021, 7:15 AM   I have seen and examined this patient with Oda Kilts.  Agree with above, note added to reflect my findings.  Patient remains in atrial fibrillation.  QTC is remained stable.  Ready for cardioversion.  GEN: Well nourished, well developed, in no acute distress  HEENT: normal  Neck: no JVD, carotid bruits, or masses Cardiac: Irregular; no murmurs, rubs, or gallops,no edema  Respiratory:  clear to auscultation bilaterally, normal work of breathing GI: soft, nontender, nondistended, + BS MS: no deformity or atrophy  Skin: warm  and dry Neuro:  Strength and sensation are intact Psych: euthymic mood, full affect   Persistent atrial fibrillation: Patient remains in atrial fibrillation.  QTC is fortunately remained stable.  Continue dofetilide 500 mcg twice daily.  Plan for cardioversion this afternoon Chronic systolic heart failure: Ejection fraction 30 to 35%, potentially tachycardia mediated.  Saksham Akkerman need repeat echo once in sinus rhythm. Tobacco abuse: Complete cessation encouraged  Devina Bezold M. Danial Hlavac MD 05/28/2021 8:42 AM

## 2021-05-29 ENCOUNTER — Other Ambulatory Visit (HOSPITAL_COMMUNITY): Payer: Self-pay

## 2021-05-29 DIAGNOSIS — I4819 Other persistent atrial fibrillation: Secondary | ICD-10-CM | POA: Diagnosis not present

## 2021-05-29 LAB — BASIC METABOLIC PANEL
Anion gap: 7 (ref 5–15)
BUN: 19 mg/dL (ref 6–20)
CO2: 24 mmol/L (ref 22–32)
Calcium: 8.7 mg/dL — ABNORMAL LOW (ref 8.9–10.3)
Chloride: 106 mmol/L (ref 98–111)
Creatinine, Ser: 1.04 mg/dL — ABNORMAL HIGH (ref 0.44–1.00)
GFR, Estimated: >60 mL/min (ref >60)
Glucose, Bld: 83 mg/dL (ref 70–99)
Potassium: 4.6 mmol/L (ref 3.5–5.1)
Sodium: 137 mmol/L (ref 135–145)

## 2021-05-29 LAB — MAGNESIUM: Magnesium: 2 mg/dL (ref 1.7–2.4)

## 2021-05-29 MED ORDER — DOFETILIDE 500 MCG PO CAPS
500.0000 ug | ORAL_CAPSULE | Freq: Two times a day (BID) | ORAL | 6 refills | Status: DC
  Filled 2021-05-29: qty 60, 30d supply, fill #0

## 2021-05-29 MED ORDER — MAGNESIUM SULFATE 2 GM/50ML IV SOLN
2.0000 g | Freq: Once | INTRAVENOUS | Status: AC
  Administered 2021-05-29: 2 g via INTRAVENOUS
  Filled 2021-05-29: qty 50

## 2021-05-29 MED ORDER — LOSARTAN POTASSIUM 25 MG PO TABS
25.0000 mg | ORAL_TABLET | Freq: Every day | ORAL | 6 refills | Status: DC
  Filled 2021-05-29: qty 30, 30d supply, fill #0

## 2021-05-29 NOTE — Progress Notes (Signed)
Pharmacy: Dofetilide (Tikosyn) - Follow Up Assessment and Electrolyte Replacement  Pharmacy consulted to assist in monitoring and replacing electrolytes in this 61 y.o. female admitted on 05/26/2021 undergoing dofetilide initiation.   Labs:    Component Value Date/Time   K 4.6 05/29/2021 0219   MG 2.0 05/29/2021 0219     Plan: Potassium: K >/= 4: No additional supplementation needed  Magnesium: Mg 1.8-2: Give Mg 2 gm IV x1    Thank you for allowing pharmacy to participate in this patient's care   Hildred Laser, PharmD Clinical Pharmacist **Pharmacist phone directory can now be found on [email or web address] ([email or web address]).  Listed under [place].

## 2021-05-29 NOTE — Anesthesia Postprocedure Evaluation (Signed)
Anesthesia Post Note  Patient: Tammy Davies  Procedure(s) Performed: CARDIOVERSION     Patient location during evaluation: PACU Anesthesia Type: General Level of consciousness: awake and alert Pain management: pain level controlled Vital Signs Assessment: post-procedure vital signs reviewed and stable Respiratory status: spontaneous breathing, nonlabored ventilation, respiratory function stable and patient connected to nasal cannula oxygen Cardiovascular status: blood pressure returned to baseline and stable Postop Assessment: no apparent nausea or vomiting Anesthetic complications: no   No notable events documented.  Last Vitals:  Vitals:   05/29/21 0845 05/29/21 1130  BP: 133/81 98/77  Pulse: 71 66  Resp: 17 16  Temp: 36.8 C 36.7 C  SpO2: 97% 97%    Last Pain:  Vitals:   05/29/21 1130  TempSrc: Oral  PainSc:                  Tiajuana Amass

## 2021-05-29 NOTE — Progress Notes (Signed)
EKG from yesterday evening 05/28/21 reviewed    Shows has converted to NSR at 67 bpm with stable QTc at ~470-480 ms.  Continue  Tikosyn 500 mcg BID.   Home this afternoon if QTc remains stable.     Electrolytes stable.  Needs to follow up about sleep study.   Shirley Friar, PA-C  Pager: [phone]  05/29/2021 7:08 AM

## 2021-05-29 NOTE — Discharge Summary (Addendum)
ELECTROPHYSIOLOGY PROCEDURE DISCHARGE SUMMARY    Patient ID: Tammy Davies,  MRN: [id number], DOB/AGE: 1961/04/20 61 y.o.  Admit date: 05/26/2021 Discharge date: 05/29/2021  Primary Care Physician: [facility]  Primary Cardiologist: Sinclair Grooms, MD  Electrophysiologist: None   Primary Discharge Diagnosis:  1.  Persistent atrial fibrillation status post Tikosyn loading this admission  Secondary Discharge Diagnosis:  2. 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  Tikosyn loading 2.  Direct current cardioversion on Wednesday May 28, 2021  by Dr Audie Box which successfully restored SR.  There were no early apparent complications.   Brief HPI: Tammy Davies is a 60 y.o. female with a past medical history as noted above.  They were referred to EP in the outpatient setting for treatment options of atrial fibrillation.  Risks, benefits, and alternatives to Tikosyn were reviewed with the patient who wished to proceed.    Hospital Course:  The patient was admitted and Tikosyn was initiated.  Renal function and electrolytes were followed during the hospitalization.  Their QTc remained stable.  On 05/28/2020 they underwent direct current cardioversion which restored sinus rhythm.  They were monitored until discharge on telemetry which demonstrated NSR.  On the day of discharge, they were examined by Dr. Curt Bears  who considered them stable for discharge to home.  Follow-up has been arranged with the Atrial Fibrillation clinic in approximately 1 week and with Dr. Quentin Ore  in 4 weeks.   Physical Exam: Vitals:   05/28/21 2017 05/29/21 0112 05/29/21 0519 05/29/21 0845  BP: 98/72 110/75 105/74 133/81  Pulse:  66 70 71  Resp:  17 19 17   Temp: 97.7 F (36.5 C) 98.1 F (36.7 C) 98.1 F (36.7 C) 98.2 F (36.8 C)   TempSrc: Oral Oral Oral Oral  SpO2:  96% 98% 97%  Weight:      Height:        GEN- The patient is well appearing, alert and oriented x 3 today.   HEENT: normocephalic, atraumatic; sclera clear, conjunctiva pink; hearing intact; oropharynx clear; neck supple, no JVP Lymph- no cervical lymphadenopathy Lungs- Clear to ausculation bilaterally, normal work of breathing.  No wheezes, rales, rhonchi Heart- Regular rate and rhythm, no murmurs, rubs or gallops, PMI not laterally displaced GI- soft, non-tender, non-distended, bowel sounds present, no hepatosplenomegaly Extremities- no clubbing, cyanosis, or edema; DP/PT/radial pulses 2+ bilaterally MS- no significant deformity or atrophy Skin- warm and dry, no rash or lesion Psych- euthymic mood, full affect Neuro- strength and sensation are intact   Labs:   Lab Results  Component Value Date   WBC 6.2 02/22/2021   HGB 15.1 (H) 02/22/2021   HCT 46.8 (H) 02/22/2021   MCV 84.0 02/22/2021   PLT 193 02/22/2021    Recent Labs  Lab 05/29/21 0219  NA 137  K 4.6  CL 106  CO2 24  BUN 19  CREATININE 1.04*  CALCIUM 8.7*  GLUCOSE 83     Discharge Medications:  Allergies as of 05/29/2021       Reactions   Heparin Other (See Comments)   06/23/20 HIT AB ODT =3 very high likelihood of true HIT; 2/7 SRA negative        Medication List     TAKE these medications    acetaminophen  500 MG tablet Commonly known as: TYLENOL Take 1,500 mg by mouth every 6 (six) hours as needed for mild pain, fever or headache.   dofetilide 500 MCG capsule Commonly known as: TIKOSYN Take 1 capsule (500 mcg total) by mouth 2 (two) times daily.   Eliquis 5 MG Tabs tablet Generic drug: apixaban Take 1 tablet (5 mg total) by mouth 2 (two) times daily.   losartan 25 MG tablet Commonly known as: COZAAR Take 1 tablet (25 mg total) by mouth daily. Start taking on: May 30, 2021   metoprolol succinate 100 MG 24 hr tablet Commonly known as: Toprol  XL Take 1 tablet (100 mg total) by mouth daily. Take with or immediately following a meal.   multivitamin capsule Take 1 capsule by mouth daily. 50 plus        Disposition:    Follow-up Information     [facility] Follow up.   Specialty: Cardiology Why: on 1/19 at 3 pm for post hospital tikosyn follow up Contact information: [address] [id number] [place] [phone]                Duration of Discharge Encounter: Greater than 30 minutes including physician time.  Signed, Shirley Friar, PA-C  05/29/2021 11:12 AM    I have seen and examined this patient with Oda Kilts.  Agree with above, note added to reflect my findings.  Admit for tikosyn load. Tolerated well. DCCV to sinus rhythm.  GEN: Well nourished, well developed, in no acute distress  HEENT: normal  Neck: no JVD, carotid bruits, or masses Cardiac: RRR; no murmurs, rubs, or gallops,no edema  Respiratory:  clear to auscultation bilaterally, normal work of breathing GI: soft, nontender, nondistended, + BS MS: no deformity or atrophy  Skin: warm and dry Neuro:  Strength and sensation are intact Psych: euthymic mood, full affect   Persistent atrial fibrillation: admit for tikosyn load. Remains in sinus rhythm after DCCV. Plan for follow up in clinic.    Tammy Whetsel M. Viviane Semidey MD 05/29/2021 1:06 PM

## 2021-05-30 ENCOUNTER — Encounter (HOSPITAL_COMMUNITY): Payer: Self-pay | Admitting: Cardiovascular Disease

## 2021-06-05 ENCOUNTER — Encounter (HOSPITAL_COMMUNITY): Payer: No Typology Code available for payment source | Admitting: Nurse Practitioner

## 2021-06-05 ENCOUNTER — Other Ambulatory Visit: Payer: Self-pay

## 2021-06-05 ENCOUNTER — Ambulatory Visit (HOSPITAL_COMMUNITY)
Admission: RE | Admit: 2021-06-05 | Discharge: 2021-06-05 | Disposition: A | Discharge status: Home / Self Care | Payer: No Typology Code available for payment source | Source: Ambulatory Visit | Attending: Nurse Practitioner | Admitting: Nurse Practitioner

## 2021-06-05 VITALS — BP 126/84 | HR 90 | Ht 68.0 in | Wt 242.6 lb

## 2021-06-05 DIAGNOSIS — E669 Obesity, unspecified: Secondary | ICD-10-CM | POA: Insufficient documentation

## 2021-06-05 DIAGNOSIS — R0600 Dyspnea, unspecified: Secondary | ICD-10-CM | POA: Diagnosis present

## 2021-06-05 DIAGNOSIS — Z8616 Personal history of COVID-19: Secondary | ICD-10-CM | POA: Diagnosis not present

## 2021-06-05 DIAGNOSIS — Z79899 Other long term (current) drug therapy: Secondary | ICD-10-CM | POA: Diagnosis not present

## 2021-06-05 DIAGNOSIS — I501 Left ventricular failure: Secondary | ICD-10-CM | POA: Diagnosis not present

## 2021-06-05 DIAGNOSIS — F17210 Nicotine dependence, cigarettes, uncomplicated: Secondary | ICD-10-CM | POA: Insufficient documentation

## 2021-06-05 DIAGNOSIS — Z09 Encounter for follow-up examination after completed treatment for conditions other than malignant neoplasm: Secondary | ICD-10-CM | POA: Diagnosis not present

## 2021-06-05 DIAGNOSIS — Z6836 Body mass index (BMI) 36.0-36.9, adult: Secondary | ICD-10-CM | POA: Insufficient documentation

## 2021-06-05 DIAGNOSIS — Z7901 Long term (current) use of anticoagulants: Secondary | ICD-10-CM | POA: Insufficient documentation

## 2021-06-05 DIAGNOSIS — I4892 Unspecified atrial flutter: Secondary | ICD-10-CM | POA: Diagnosis not present

## 2021-06-05 DIAGNOSIS — I4819 Other persistent atrial fibrillation: Secondary | ICD-10-CM | POA: Diagnosis not present

## 2021-06-05 DIAGNOSIS — G4733 Obstructive sleep apnea (adult) (pediatric): Secondary | ICD-10-CM | POA: Insufficient documentation

## 2021-06-05 DIAGNOSIS — I110 Hypertensive heart disease with heart failure: Secondary | ICD-10-CM | POA: Diagnosis not present

## 2021-06-05 DIAGNOSIS — D6869 Other thrombophilia: Secondary | ICD-10-CM | POA: Diagnosis not present

## 2021-06-05 LAB — BASIC METABOLIC PANEL
Anion gap: 10 (ref 5–15)
BUN: 16 mg/dL (ref 6–20)
CO2: 21 mmol/L — ABNORMAL LOW (ref 22–32)
Calcium: 9 mg/dL (ref 8.9–10.3)
Chloride: 109 mmol/L (ref 98–111)
Creatinine, Ser: 0.91 mg/dL (ref 0.44–1.00)
GFR, Estimated: >60 mL/min (ref >60)
Glucose, Bld: 102 mg/dL — ABNORMAL HIGH (ref 70–99)
Potassium: 4.2 mmol/L (ref 3.5–5.1)
Sodium: 140 mmol/L (ref 135–145)

## 2021-06-05 LAB — CBC
HCT: 47.9 % — ABNORMAL HIGH (ref 36.0–46.0)
Hemoglobin: 15.5 g/dL — ABNORMAL HIGH (ref 12.0–15.0)
MCH: 28.8 pg (ref 26.0–34.0)
MCHC: 32.4 g/dL (ref 30.0–36.0)
MCV: 88.9 fL (ref 80.0–100.0)
Platelets: 201 10*3/uL (ref 150–400)
RBC: 5.39 MIL/uL — ABNORMAL HIGH (ref 3.87–5.11)
RDW: 15.4 % (ref 11.5–15.5)
WBC: 6.6 10*3/uL (ref 4.0–10.5)
nRBC: 0 % (ref 0.0–0.2)

## 2021-06-05 LAB — MAGNESIUM: Magnesium: 1.9 mg/dL (ref 1.7–2.4)

## 2021-06-05 NOTE — Progress Notes (Signed)
Primary Care Physician: [facility] Referring Physician: Robbie Lis Cardiologist: Dr. Tamala Julian  Primary EP: Dr Wilkie Aye is a 61 y.o. 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  She was found to have persistent afib/flutter and was on Cardizem IV, transitioned to oral prior to d/c. She was on heparin for a while perioperatively but transitioned back to eliquis prior to d/c. She was also positive for Covid on admission but asymptomatic and has had all vaccines.   She was seen by Robbie Lis, PA, and still had RVR in the 120's and he added low dose BB to her daily Cardizem. However, pt misunderstood and stopped her CCB, still in atrial flutter(typical) today at 127 bpm. She states that she does not feel her heart rhythm and is tolerating this well. She expects to have a reverse colostomy in 6 months.   F/u in the afib clinic, 07/26/20. At time of cardioversion , she went from atrail flutter with 3-4 AVB, to afib with RVR at 160 bpm. She eas cardioverted 2 additional times but did not convert. She was given esmolol per anesthesiology and  was d/c in afib in the 90's. The ekg shows today that she remains in afib at 5 bpm.she states that the afib is not bothering her and she feels well. She is pending having her colonoscopy reversed in June. She continues on BB/CCB for heart rate control.   F/u in afib clinic, 03/04/21. Initially, she thought that she may have  colostomy reversed in June, however this did not occur. She was admitted in June with abdominal pain and terminal ileitis. She asked for this appointment as she has been feeling more short of breath. In SR with controlled v rate today. She mentions now that reversal may not be until January if at all, as the surgeon told her she has a lot of scar  tissue. She would like to try to restore SR. She has not noted any fluid retention. Her weight is up 12 lbs. She feels this represents true weight gain. She has cut back smoking to one pack a week. When she was in the hospital in June, she was told that she needed a sleep study as she had snoring and  desaturation in her O2 with sleep.   04/16/21, Pt is here to f/u up on echo that showed moderately reduced EF at 30-35%. I  discussed with Dr. Tamala Julian and he thought probably [facility] and would try to restore sinus rhythm to improve EF, medication adjustment to address EFdysfunction and after restoring SR, he will consider further if ischemic w/u is needed. Weight is up a few lbs, but she states not fluid. I cannot appreciate any fluid and pt states that her diet is terrible. She has cut back but continues to smoke. She actually states that her breathing is improved as compared when she asked to be seen in October. She is agreeable to coming into hospital for Tikosyn but wants to wait until early January.   Follow up in the AF clinic 05/26/21. Patient presents for dofetilide admission. She denies any missed doses of anticoagulation in the last 3 weeks.   Follow up in the AF clinic 06/05/21. Patient is s/p dofetilide loading 1/9-1/12/23 with DCCV on 05/28/21. She reports that she felt "great" for 3 days after  leaving the hospital. Unfortunately, she started feeling dyspnea on exertion on the fourth day and ECG shows afib today. She is very disappointed she is back in afib. There were no specific triggers that she could identify.   Today, she denies symptoms of palpitations, chest pain, orthopnea, PND, lower extremity edema, dizziness, presyncope, syncope, or neurologic sequela. The patient is tolerating medications without difficulties and is otherwise without complaint today.   Past Medical History:  Diagnosis Date   Dysrhythmia    History of COVID-19    PAF (paroxysmal atrial fibrillation) ([place])    Tobacco abuse     Past Surgical History:  Procedure Laterality Date   ABDOMINAL HYSTERECTOMY     CARDIOVERSION N/A 07/22/2020   Procedure: CARDIOVERSION;  Surgeon: [name], MD;  Location: [facility] ENDOSCOPY;  Service: Cardiovascular;  Laterality: N/A;   CARDIOVERSION N/A 05/28/2021   Procedure: CARDIOVERSION;  Surgeon: Geralynn Rile, MD;  Location: [facility];  Service: Cardiovascular;  Laterality: N/A;   CHOLECYSTECTOMY N/A 08/18/2016   Procedure: LAPAROSCOPIC CHOLECYSTECTOMY WITH  INTRAOPERATIVE CHOLANGIOGRAM;  Surgeon: Johnathan Hausen, MD;  Location: [facility] ORS;  Service: General;  Laterality: N/A;   COLECTOMY WITH COLOSTOMY CREATION/HARTMANN PROCEDURE N/A 06/18/2020   Procedure: SIGMOID COLECTOMY WITH COLOSTOMY CREATION; DRAINAGE OF PELVIC ABSCESS;  Surgeon: Rolm Bookbinder, MD;  Location: [facility];  Service: General;  Laterality: N/A;   CYSTOSCOPY WITH STENT PLACEMENT Bilateral 06/18/2020   Procedure: CYSTOSCOPY WITH STENT PLACEMENT;  Surgeon: Alexis Frock, MD;  Location: [facility];  Service: Urology;  Laterality: Bilateral;   LAPAROTOMY N/A 06/18/2020   Procedure: EXPLORATORY LAPAROTOMY;  Surgeon: Rolm Bookbinder, MD;  Location: [facility];  Service: General;  Laterality: N/A;    Current Outpatient Medications  Medication Sig Dispense Refill   acetaminophen (TYLENOL) 500 MG tablet Take 1,500 mg by mouth every 6 (six) hours as needed for mild pain, fever or headache.     apixaban (ELIQUIS) 5 MG TABS tablet Take 1 tablet (5 mg total) by mouth 2 (two) times daily. 180 tablet 1   dofetilide (TIKOSYN) 500 MCG capsule Take 1 capsule (500 mcg total) by mouth 2 (two) times daily. 60 capsule 6   losartan (COZAAR) 25 MG tablet Take 1 tablet (25 mg total) by mouth daily. 30 tablet 6   metoprolol succinate (TOPROL XL) 100 MG 24 hr tablet Take 1 tablet (100 mg total) by mouth daily. Take with or immediately following a meal. 30 tablet 3   Multiple Vitamin (MULTIVITAMIN) capsule Take 1 capsule by mouth daily. 50 plus      No current facility-administered medications for this encounter.    Allergies  Allergen Reactions   Heparin Other (See Comments)    06/23/20 HIT AB ODT =3 very high likelihood of true HIT; 2/7 SRA negative    Social History   Socioeconomic History   Marital status: Single    Spouse name: Not on file   Number of children: Not on file   Years of education: Not on file   Highest education level: Not on file  Occupational History   Not on file  Tobacco Use   Smoking status: Every Day    Types: Cigarettes    Start date: 6   Smokeless tobacco: Never   Tobacco comments:    1 cigarette a day  Vaping Use   Vaping Use: Never used  Substance and Sexual Activity   Alcohol use: No   Drug use: Yes    Types: Marijuana    Comment: last used  Christmas   Sexual activity: Never  Other Topics Concern   Not on file  Social History Narrative   Not on file   Social Determinants of Health   Financial Resource Strain: Not on file  Food Insecurity: Not on file  Transportation Needs: Not on file  Physical Activity: Not on file  Stress: Not on file  Social Connections: Not on file  Intimate Partner Violence: Not on file    Family History  Problem Relation Age of Onset   Heart failure Mother    Breast cancer Mother    Atrial fibrillation Mother    Atrial fibrillation Father    Heart failure Father    Diabetes Father    Colon cancer Neg Hx    Stomach cancer Neg Hx    Esophageal cancer Neg Hx    Pancreatic cancer Neg Hx    Liver disease Neg Hx     ROS- All systems are reviewed and negative except as per the HPI above  Physical Exam: Vitals:   06/05/21 1534  BP: 126/84  Pulse: 90  Weight: 110 kg  Height: 5\' 8"  (1.727 m)   Wt Readings from Last 3 Encounters:  06/05/21 110 kg  05/28/21 108.5 kg  05/26/21 108.4 kg    Labs: Lab Results  Component Value Date   NA 137 05/29/2021   K 4.6 05/29/2021   CL 106 05/29/2021   CO2 24 05/29/2021   GLUCOSE 83 05/29/2021    BUN 19 05/29/2021   CREATININE 1.04 (H) 05/29/2021   CALCIUM 8.7 (L) 05/29/2021   PHOS 3.5 06/20/2020   MG 2.0 05/29/2021   Lab Results  Component Value Date   INR 1.1 05/27/2021   Lab Results  Component Value Date   CHOL 173 06/08/2017   HDL 40 (L) 06/08/2017   LDLCALC 110 (H) 06/08/2017   TRIG 124 06/22/2020    GEN- The patient is a well appearing obese female, alert and oriented x 3 today.   HEENT-head normocephalic, atraumatic, sclera clear, conjunctiva pink, hearing intact, trachea midline. Lungs- Clear to ausculation bilaterally, normal work of breathing Heart- irregular rate and rhythm, no murmurs, rubs or gallops  GI- soft, NT, ND, + BS Extremities- no clubbing, cyanosis, or edema MS- no significant deformity or atrophy Skin- no rash or lesion Psych- euthymic mood, full affect Neuro- strength and sensation are intact   EKG- afib, LBBB Vent. rate 90 BPM PR interval * ms QRS duration 126 ms QT/QTcB 370/452 ms   Echo- 1. Left ventricular ejection fraction, by estimation, is 30 to 35%. The  left ventricle has moderately decreased function. The left ventricle  demonstrates global hypokinesis. The left ventricular internal cavity size  was severely dilated. There is mild  left ventricular hypertrophy. Left ventricular diastolic parameters are  indeterminate.   2. Right ventricular systolic function is normal. The right ventricular  size is normal. Tricuspid regurgitation signal is inadequate for assessing  PA pressure.   3. Left atrial size was mildly dilated.   4. The mitral valve is normal in structure. No evidence of mitral valve  regurgitation.   5. The aortic valve is tricuspid. Aortic valve regurgitation is not  visualized. No aortic stenosis is present.   6. The inferior vena cava is dilated in size with >50% respiratory  variability, suggesting right atrial pressure of 8 mmHg.   FINDINGS   Left Ventricle: Left ventricular ejection fraction, by  estimation, is 30  to 35%. The left ventricle has moderately decreased function.  The left  ventricle demonstrates global hypokinesis. The left ventricular internal  cavity size was severely dilated.  There is mild left ventricular hypertrophy. Left ventricular diastolic  parameters are indeterminate.   Right Ventricle: The right ventricular size is normal. Right vetricular  wall thickness was not well visualized. Right ventricular systolic  function is normal. Tricuspid regurgitation signal is inadequate for  assessing PA pressure.              Assessment and Plan: 1. Persistent Afib/flutter S/p dofetilide loading 1/9-1/12/23 with DCCV on 05/28/21 Unfortunately, she is back in afib.  Will plan for DCCV now that she has loaded on dofetilide longer. Continue dofetilide 500 mcg BID Continue Eliquis 5 mg BID Check bmet/mag/cbc today. Continue Toprol 100 mg daily  2. LV dysfunction EF 30-35% Hopefully this will improve with SR. No signs or symptoms of fluid overload today.  3. CHA2DS2VASc score of 3 Continue eliquis 5 mg bid   4.HTN Stable, no changes today.  5. Obesity  Body mass index is 36.89 kg/m. Lifestyle modification was discussed and encouraged including regular physical activity and weight reduction.  6. OSA CPAP titration pending.   Follow up with Richardson Dopp and Dr Quentin Ore as scheduled.    [facility] [address] [place], [place] [address] [phone]

## 2021-06-05 NOTE — Patient Instructions (Signed)
Cardioversion scheduled for Friday, January 27th  - Arrive at the [facility] and go to admitting at 930AM  - Do not eat or drink anything after midnight the night prior to your procedure.  - Take all your morning medication (except diabetic medications) with a sip of water prior to arrival.  - You will not be able to drive home after your procedure.  - Do NOT miss any doses of your blood thinner - if you should miss a dose please notify our office immediately.  - If you feel as if you go back into normal rhythm prior to scheduled cardioversion, please notify our office immediately. If your procedure is canceled in the cardioversion suite you will be charged a cancellation fee. Patients will be asked to: to mask in public and hand hygiene (no longer quarantine) in the 3 days prior to surgery, to report if any COVID-19-like illness or household contacts to COVID-19 to determine need for testing

## 2021-06-09 ENCOUNTER — Encounter: Payer: Self-pay | Admitting: Physician Assistant

## 2021-06-09 DIAGNOSIS — I5020 Unspecified systolic (congestive) heart failure: Secondary | ICD-10-CM

## 2021-06-09 HISTORY — DX: Unspecified systolic (congestive) heart failure: I50.20

## 2021-06-09 NOTE — Telephone Encounter (Signed)
Received a call today from [place] me of CPAP titration denial. Patient informed APAP will be ordered and sent to [facility].

## 2021-06-09 NOTE — H&P (View-Only) (Signed)
Cardiology Office Note:    Date:  06/10/2021   ID:  Tammy Davies, DOB [birth year], MRN [id number]  PCP:  [facility], [place] Providers Cardiologist:  Sinclair Grooms, MD     Referring MD: [facility], [facility]   Chief Complaint:  F/u for CHF    Patient Profile: Paroxysmal atrial fibrillation  Persistent AF after admx with diverticulitis in 1/22 S/p failed DCCV in 3/22 Dofetilide Rx load 05/2021 s/p DCCV >> ERAF (HFrEF) heart failure with reduced ejection fraction  ?Tachy induced - persistent AF Hypertension  OSA Diverticulitis - admx in 05/2020 S/p Hartmann colectomy w colostomy C/b recurrent AF w RVR +Cigs  Prior CV Studies: Echocardiogram 04/07/21 EF 30-35, global HK, mild LVH, normal RVSF, mild LAE  Echocardiogram 06/09/17 EF 60-65    History of Present Illness:   Tammy Davies is a 61 y.o. female with the above problem list.  She was last seen in the AF clinic 06/05/21 after admission for Dofetilide load.  She had recurrent AF and is set up for repeat DCCV 06/13/21 per the AF Clinic.  She returns for further management of HFrEF.  She is here alone.  She notes continued issues with shortness of breath.  She is becoming more and more short of breath with exertion.  She also notes orthopnea, paroxysmal nocturnal dyspnea.  She has not noted much leg edema.  But, her abdomen feels distended.  She had some "puffiness" around her colostomy site when she changed the bag the other day.  She has not had chest pain, syncope.          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   apixaban (ELIQUIS) 5 MG TABS tablet Take 1 tablet (5 mg total) by mouth 2 (two) times daily.   dofetilide (TIKOSYN) 500 MCG capsule Take 1 capsule (500 mcg total) by mouth 2 (two) times daily.   furosemide (LASIX) 20 MG tablet Take 1 tablet (20 mg total) by mouth daily.   losartan (COZAAR) 25 MG tablet Take 1 tablet (25 mg total) by mouth daily.   metoprolol succinate (TOPROL XL) 100 MG 24 hr tablet Take 1 tablet (100 mg total) by mouth daily. Take with or immediately following a meal.   Multiple Vitamin (MULTIVITAMIN) capsule Take 1 capsule by mouth daily. 50 plus   potassium chloride (KLOR-CON) 10 MEQ tablet Take 1 tablet (10 mEq total) by mouth daily.    Allergies:   Heparin   Social History   Tobacco Use   Smoking status: Every Day    Types: Cigarettes    Start date: 44   Smokeless tobacco: Never   Tobacco comments:    1 cigarette a day  Vaping Use   Vaping Use: Never used  Substance Use Topics   Alcohol use: No   Drug use: Yes    Types: Marijuana    Comment: last used Christmas    Family Hx: The patient's family history  includes Atrial fibrillation in her father and mother; Breast cancer in her mother; Diabetes in her father; Heart failure in her father and mother. There is no history of Colon cancer, Stomach cancer, Esophageal cancer, Pancreatic cancer, or Liver disease.  Review of Systems  Gastrointestinal:  Negative for hematochezia and melena.  Genitourinary:  Negative for hematuria.    EKGs/Labs/Other Test Reviewed:    EKG:  EKG is   ordered today.  The ekg ordered today demonstrates atrial fibrillation, HR 96, Left Bundle Branch Block, QTc 469  Recent Labs: 06/11/2020: B Natriuretic Peptide 279.9 02/22/2021: ALT 11 06/05/2021: BUN 16; Creatinine, Ser 0.91; Hemoglobin 15.5; Magnesium 1.9; Platelets 201; Potassium 4.2;  Sodium 140   Recent Lipid Panel Recent Labs    06/22/20 0935  TRIG 124     Risk Assessment/Calculations:    CHA2DS2-VASc Score = 3   This indicates a 3.2% annual risk of stroke. The patient's score is based upon: CHF History: 1 HTN History: 1 Diabetes History: 0 Stroke History: 0 Vascular Disease History: 0 Age Score: 0 Gender Score: 1        Physical Exam:    VS:  BP 130/70 (BP Location: Right Arm)    Pulse 96    Ht 5\' 8"  (1.727 m)    Wt 248 lb (112.5 kg)    SpO2 95%    BMI 37.71 kg/m     Wt Readings from Last 3 Encounters:  06/10/21 248 lb (112.5 kg)  06/05/21 242 lb 9.6 oz (110 kg)  05/28/21 239 lb 3.2 oz (108.5 kg)    Constitutional:      Appearance: Healthy appearance. Not in distress.  Neck:     Vascular: JVR present. JVD elevated.  Pulmonary:     Breath sounds: No wheezing. No rales.  Cardiovascular:     Tachycardia present. Irregularly irregular rhythm.     Murmurs: There is no murmur.  Edema:    Peripheral edema absent.  Abdominal:     General: There is distension.  Skin:    General: Skin is warm and dry.  Neurological:     General: No focal deficit present.     Mental Status: Alert and oriented to person, place and time.         ASSESSMENT & PLAN:   HFrEF (heart failure with reduced ejection fraction) (HCC) EF 30-35.  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  Her weight is up almost 10 lbs.  She has elevated JVP on exam and has symptoms of volume overload.  She needs diuresis.  She is on Dofetilide.  I will need to use caution to avoid making her K+ or Mg2+ too low with upcoming DCCV this Friday. Furosemide 40 mg x 1, then 20 mg daily K+ 20 mEq x 1 then 10 mEq daily BMET, magnesium today BMET magnesium a.m. of cardioversion Follow-up with me next week Consider changing ARB to Entresto versus adding MRA or SGLT2 inhibitor at follow-up  Persistent atrial fibrillation (HCC) Heart rate with reasonable control.  She is tolerating  anticoagulation.  Continue apixaban 5 mg twice daily, metoprolol succinate 100 mg daily.  She is on dofetilide 500 mcg twice daily.  She has a repeat cardioversion scheduled for later this week.  Obtain BMET, magnesium today and repeat prior to cardioversion on Friday given initiation of diuretic therapy.  Hypertension Fair control.  Continue losartan 25 mg daily, metoprolol succinate 100 mg daily.  OSA (obstructive sleep apnea) She is awaiting CPAP machine.  Dispo:  Return in about 1 week (around 06/17/2021) for Post cardioversion 1 week follow up with Richardson Dopp, PA-C..   Medication Adjustments/Labs and Tests Ordered: Current medicines are reviewed at length with the patient today.  Concerns regarding medicines are outlined above.  Tests Ordered: Orders Placed This Encounter  Procedures   Basic Metabolic Panel (BMET)   Magnesium   EKG 12-Lead   Medication Changes: Meds ordered this encounter  Medications   furosemide (LASIX) 20 MG tablet    Sig: Take 1 tablet (20 mg total) by mouth daily.    Dispense:  30 tablet    Refill:  2   potassium chloride (KLOR-CON) 10 MEQ tablet    Sig: Take 1 tablet (10 mEq total) by mouth daily.    Dispense:  30 tablet    Refill:  2   Signed, Richardson Dopp, PA-C  06/10/2021 12:11 PM    [facility] [address], [place], [place]  [address] Phone: [phone]; Fax: [phone]

## 2021-06-09 NOTE — Progress Notes (Signed)
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 female with the above problem list.  She was last seen in the AF clinic 06/05/21 after admission for Dofetilide load.  She had recurrent AF and is set up for repeat DCCV 06/13/21 per the AF Clinic.  She returns for further management of HFrEF.  She is here alone.  She notes continued issues with shortness of breath.  She is becoming more and more short of breath with exertion.  She also notes orthopnea, paroxysmal nocturnal dyspnea.  She has not noted much leg edema.  But, her abdomen feels distended.  She had some "puffiness" around her colostomy site when she changed the bag the other day.  She has not had chest pain, syncope.          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   apixaban (ELIQUIS) 5 MG TABS tablet Take 1 tablet (5 mg total) by mouth 2 (two) times daily.   dofetilide (TIKOSYN) 500 MCG capsule Take 1 capsule (500 mcg total) by mouth 2 (two) times daily.   furosemide (LASIX) 20 MG tablet Take 1 tablet (20 mg total) by mouth daily.   losartan (COZAAR) 25 MG tablet Take 1 tablet (25 mg total) by mouth daily.   metoprolol succinate (TOPROL XL) 100 MG 24 hr tablet Take 1 tablet (100 mg total) by mouth daily. Take with or immediately following a meal.   Multiple Vitamin (MULTIVITAMIN) capsule Take 1 capsule by mouth daily. 50 plus   potassium chloride (KLOR-CON) 10 MEQ tablet Take 1 tablet (10 mEq total) by mouth daily.    Allergies:   Heparin   Social History   Tobacco Use   Smoking status: Every Day    Types: Cigarettes    Start date: 74   Smokeless tobacco: Never   Tobacco comments:    1 cigarette a day  Vaping Use   Vaping Use: Never used  Substance Use Topics   Alcohol use: No   Drug use: Yes    Types: Marijuana    Comment: last used Christmas    Family Hx: The patient's family history  includes Atrial fibrillation in her father and mother; Breast cancer in her mother; Diabetes in her father; Heart failure in her father and mother. There is no history of Colon cancer, Stomach cancer, Esophageal cancer, Pancreatic cancer, or Liver disease.  Review of Systems  Gastrointestinal:  Negative for hematochezia and melena.  Genitourinary:  Negative for hematuria.    EKGs/Labs/Other Test Reviewed:    EKG:  EKG is   ordered today.  The ekg ordered today demonstrates atrial fibrillation, HR 96, Left Bundle Branch Block, QTc 469  Recent Labs: 06/11/2020: B Natriuretic Peptide 279.9 02/22/2021: ALT 11 06/05/2021: BUN 16; Creatinine, Ser 0.91; Hemoglobin 15.5; Magnesium 1.9; Platelets 201; Potassium 4.2;  Sodium 140   Recent Lipid Panel Recent Labs    06/22/20 0935  TRIG 124     Risk Assessment/Calculations:    CHA2DS2-VASc Score = 3   This indicates a 3.2% annual risk of stroke. The patient's score is based upon: CHF History: 1 HTN History: 1 Diabetes History: 0 Stroke History: 0 Vascular Disease History: 0 Age Score: 0 Gender Score: 1        Physical Exam:    VS:  BP 130/70 (BP Location: Right Arm)    Pulse 96    Ht 5\' 8"  (1.727 m)    Wt 248 lb (112.5 kg)    SpO2 95%    BMI 37.71 kg/m     Wt Readings from Last 3 Encounters:  06/10/21 248 lb (112.5 kg)  06/05/21 242 lb 9.6 oz (110 kg)  05/28/21 239 lb 3.2 oz (108.5 kg)    Constitutional:      Appearance: Healthy appearance. Not in distress.  Neck:     Vascular: JVR present. JVD elevated.  Pulmonary:     Breath sounds: No wheezing. No rales.  Cardiovascular:     Tachycardia present. Irregularly irregular rhythm.     Murmurs: There is no murmur.  Edema:    Peripheral edema absent.  Abdominal:     General: There is distension.  Skin:    General: Skin is warm and dry.  Neurological:     General: No focal deficit present.     Mental Status: Alert and oriented to person, place and time.         ASSESSMENT & PLAN:   HFrEF (heart failure with reduced ejection fraction) (HCC) EF 30-35.  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  Her weight is up almost 10 lbs.  She has elevated JVP on exam and has symptoms of volume overload.  She needs diuresis.  She is on Dofetilide.  I will need to use caution to avoid making her K+ or Mg2+ too low with upcoming DCCV this Friday. Furosemide 40 mg x 1, then 20 mg daily K+ 20 mEq x 1 then 10 mEq daily BMET, magnesium today BMET magnesium a.m. of cardioversion Follow-up with me next week Consider changing ARB to Entresto versus adding MRA or SGLT2 inhibitor at follow-up  Persistent atrial fibrillation (HCC) Heart rate with reasonable control.  She is tolerating  anticoagulation.  Continue apixaban 5 mg twice daily, metoprolol succinate 100 mg daily.  She is on dofetilide 500 mcg twice daily.  She has a repeat cardioversion scheduled for later this week.  Obtain BMET, magnesium today and repeat prior to cardioversion on Friday given initiation of diuretic therapy.  Hypertension Fair control.  Continue losartan 25 mg daily, metoprolol succinate 100 mg daily.  OSA (obstructive sleep apnea) She is awaiting CPAP machine.  Dispo:  Return in about 1 week (around 06/17/2021) for Post cardioversion 1 week follow up with Richardson Dopp, PA-C..   Medication Adjustments/Labs and Tests Ordered: Current medicines are reviewed at length with the patient today.  Concerns regarding medicines are outlined above.  Tests Ordered: Orders Placed This Encounter  Procedures   Basic Metabolic Panel (BMET)   Magnesium   EKG 12-Lead   Medication Changes: Meds ordered this encounter  Medications   furosemide (LASIX) 20 MG tablet    Sig: Take 1 tablet (20 mg total) by mouth daily.    Dispense:  30 tablet    Refill:  2   potassium chloride (KLOR-CON) 10 MEQ tablet    Sig: Take 1 tablet (10 mEq total) by mouth daily.    Dispense:  30 tablet    Refill:  2   Signed, Richardson Dopp, PA-C  06/10/2021 12:11 PM    [facility] [address], [place], [place]  [address] Phone: [phone]; Fax: [phone]

## 2021-06-10 ENCOUNTER — Encounter: Payer: Self-pay | Admitting: Physician Assistant

## 2021-06-10 ENCOUNTER — Other Ambulatory Visit: Payer: Self-pay

## 2021-06-10 ENCOUNTER — Ambulatory Visit (INDEPENDENT_AMBULATORY_CARE_PROVIDER_SITE_OTHER): Payer: No Typology Code available for payment source | Admitting: Physician Assistant

## 2021-06-10 VITALS — BP 130/70 | HR 96 | Ht 68.0 in | Wt 248.0 lb

## 2021-06-10 DIAGNOSIS — K5720 Diverticulitis of large intestine with perforation and abscess without bleeding: Secondary | ICD-10-CM

## 2021-06-10 DIAGNOSIS — I5023 Acute on chronic systolic (congestive) heart failure: Secondary | ICD-10-CM | POA: Diagnosis not present

## 2021-06-10 DIAGNOSIS — G4733 Obstructive sleep apnea (adult) (pediatric): Secondary | ICD-10-CM | POA: Diagnosis not present

## 2021-06-10 DIAGNOSIS — I10 Essential (primary) hypertension: Secondary | ICD-10-CM

## 2021-06-10 DIAGNOSIS — I4819 Other persistent atrial fibrillation: Secondary | ICD-10-CM | POA: Diagnosis not present

## 2021-06-10 DIAGNOSIS — I5020 Unspecified systolic (congestive) heart failure: Secondary | ICD-10-CM

## 2021-06-10 LAB — BASIC METABOLIC PANEL
BUN/Creatinine Ratio: 18 (ref 12–28)
BUN: 15 mg/dL (ref 8–27)
CO2: 24 mmol/L (ref 20–29)
Calcium: 9.6 mg/dL (ref 8.7–10.3)
Chloride: 102 mmol/L (ref 96–106)
Creatinine, Ser: 0.85 mg/dL (ref 0.57–1.00)
Glucose: 93 mg/dL (ref 70–99)
Potassium: 4.7 mmol/L (ref 3.5–5.2)
Sodium: 140 mmol/L (ref 134–144)
eGFR: 78 mL/min/{1.73_m2} (ref >59)

## 2021-06-10 LAB — MAGNESIUM: Magnesium: 1.9 mg/dL (ref 1.6–2.3)

## 2021-06-10 MED ORDER — FUROSEMIDE 20 MG PO TABS: 20.0000 mg | ORAL_TABLET | Freq: Every day | ORAL | 2 refills | Status: DC

## 2021-06-10 MED ORDER — POTASSIUM CHLORIDE ER 10 MEQ PO TBCR: 10.0000 meq | EXTENDED_RELEASE_TABLET | Freq: Every day | ORAL | 2 refills | Status: DC

## 2021-06-10 NOTE — Assessment & Plan Note (Signed)
She is awaiting CPAP machine.

## 2021-06-10 NOTE — Assessment & Plan Note (Signed)
Heart rate with reasonable control.  She is tolerating anticoagulation.  Continue apixaban 5 mg twice daily, metoprolol succinate 100 mg daily.  She is on dofetilide 500 mcg twice daily.  She has a repeat cardioversion scheduled for later this week.  Obtain BMET, magnesium today and repeat prior to cardioversion on Friday given initiation of diuretic therapy.

## 2021-06-10 NOTE — Assessment & Plan Note (Addendum)
Fair control.  Continue losartan 25 mg daily, metoprolol succinate 100 mg daily.

## 2021-06-10 NOTE — Patient Instructions (Signed)
Medication Instructions:   START Lasix two tablets by mouth( 40 mg) today than, one (1) tablet by mouth ( 20 mg) daily.   START Potassium two tablets by mouth ( 20 mEq) today than, one (1) tablet by mouth ( 10 mEq ) daily.  *If you need a refill on your cardiac medications before your next appointment, please call your pharmacy*   Lab Work:   TODAY!!!!!  BMET/MAG  2 hours early on Friday, January 27 before cardioversion at hospital for STAT labs.   If you have labs (blood work) drawn today and your tests are completely normal, you will receive your results only by: [place] (if you have MyChart) OR A paper copy in the mail If you have any lab test that is abnormal or we need to change your treatment, we will call you to review the results.   Testing/Procedures:  None ordered.   Follow-Up: At [facility], you and your health needs are our priority.  As part of our continuing mission to provide you with exceptional heart care, we have created designated Provider Care Teams.  These Care Teams include your primary Cardiologist (physician) and Advanced Practice Providers (APPs -  Physician Assistants and Nurse Practitioners) who all work together to provide you with the care you need, when you need it.  We recommend signing up for the patient portal called "MyChart".  Sign up information is provided on this After Visit Summary.  MyChart is used to connect with patients for Virtual Visits (Telemedicine).  Patients are able to view lab/test results, encounter notes, upcoming appointments, etc.  Non-urgent messages can be sent to your provider as well.   To learn more about what you can do with MyChart, go to [email or web address].    Your next appointment:   1 week(s)  The format for your next appointment:   In Person  Provider:   Richardson Dopp, PA-C         Other Instructions

## 2021-06-10 NOTE — Assessment & Plan Note (Signed)
EF 30-35.  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  Her weight is up almost 10 lbs.  She has elevated JVP on exam and has symptoms of volume overload.  She needs diuresis.  She is on Dofetilide.  I will need to use caution to avoid making her K+ or Mg2+ too low with upcoming DCCV this Friday.  Furosemide 40 mg x 1, then 20 mg daily  K+ 20 mEq x 1 then 10 mEq daily  BMET, magnesium today  BMET magnesium a.m. of cardioversion  Follow-up with me next week  Consider changing ARB to Entresto versus adding MRA or SGLT2 inhibitor at follow-up

## 2021-06-11 ENCOUNTER — Other Ambulatory Visit: Payer: Self-pay | Admitting: *Deleted

## 2021-06-11 MED ORDER — MAGNESIUM OXIDE 400 MG PO CAPS: 400.0000 mg | ORAL_CAPSULE | Freq: Every day | ORAL | 0 refills | Status: DC

## 2021-06-12 NOTE — Anesthesia Preprocedure Evaluation (Addendum)
Anesthesia Evaluation  Patient identified by MRN, date of birth, ID band Patient awake    Reviewed: Allergy & Precautions, H&P , NPO status , Patient's Chart, lab work & pertinent test results, reviewed documented beta blocker date and time   Airway Mallampati: II  TM Distance: >3 FB Neck ROM: Full    Dental no notable dental hx. (+) Poor Dentition, Dental Advisory Given   Pulmonary sleep apnea , Current Smoker and Patient abstained from smoking.,    Pulmonary exam normal breath sounds clear to auscultation       Cardiovascular Exercise Tolerance: Good hypertension, Pt. on medications and Pt. on home beta blockers + dysrhythmias Atrial Fibrillation  Rhythm:Irregular Rate:Normal     Neuro/Psych negative neurological ROS  negative psych ROS   GI/Hepatic negative GI ROS, Neg liver ROS,   Endo/Other  negative endocrine ROS  Renal/GU negative Renal ROS  negative genitourinary   Musculoskeletal   Abdominal   Peds  Hematology negative hematology ROS (+)   Anesthesia Other Findings   Reproductive/Obstetrics negative OB ROS                            Anesthesia Physical Anesthesia Plan  ASA: 3  Anesthesia Plan: General   Post-op Pain Management: Minimal or no pain anticipated   Induction: Intravenous  PONV Risk Score and Plan: 2 and Propofol infusion and Treatment may vary due to age or medical condition  Airway Management Planned: Mask and Natural Airway  Additional Equipment:   Intra-op Plan:   Post-operative Plan:   Informed Consent: I have reviewed the patients History and Physical, chart, labs and discussed the procedure including the risks, benefits and alternatives for the proposed anesthesia with the patient or authorized representative who has indicated his/her understanding and acceptance.     Dental advisory given  Plan Discussed with: CRNA  Anesthesia Plan Comments:         Anesthesia Quick Evaluation

## 2021-06-13 ENCOUNTER — Other Ambulatory Visit: Payer: Self-pay

## 2021-06-13 ENCOUNTER — Ambulatory Visit (HOSPITAL_COMMUNITY): Payer: No Typology Code available for payment source | Admitting: Anesthesiology

## 2021-06-13 ENCOUNTER — Ambulatory Visit (HOSPITAL_COMMUNITY)
Admission: RE | Admit: 2021-06-13 | Discharge: 2021-06-13 | Disposition: A | Discharge status: Home / Self Care | Payer: No Typology Code available for payment source | Attending: Cardiology | Admitting: Cardiology

## 2021-06-13 ENCOUNTER — Encounter (HOSPITAL_COMMUNITY): Payer: Self-pay | Admitting: Cardiology

## 2021-06-13 ENCOUNTER — Encounter (HOSPITAL_COMMUNITY)
Admission: RE | Disposition: A | Discharge status: Home / Self Care | Payer: Self-pay | Source: Home / Self Care | Attending: Cardiology

## 2021-06-13 DIAGNOSIS — I4891 Unspecified atrial fibrillation: Secondary | ICD-10-CM | POA: Diagnosis not present

## 2021-06-13 DIAGNOSIS — I5022 Chronic systolic (congestive) heart failure: Secondary | ICD-10-CM | POA: Diagnosis not present

## 2021-06-13 DIAGNOSIS — Z79899 Other long term (current) drug therapy: Secondary | ICD-10-CM | POA: Insufficient documentation

## 2021-06-13 DIAGNOSIS — I4819 Other persistent atrial fibrillation: Secondary | ICD-10-CM | POA: Diagnosis present

## 2021-06-13 DIAGNOSIS — F17210 Nicotine dependence, cigarettes, uncomplicated: Secondary | ICD-10-CM | POA: Insufficient documentation

## 2021-06-13 DIAGNOSIS — G4733 Obstructive sleep apnea (adult) (pediatric): Secondary | ICD-10-CM | POA: Diagnosis not present

## 2021-06-13 DIAGNOSIS — I110 Hypertensive heart disease with heart failure: Secondary | ICD-10-CM | POA: Diagnosis not present

## 2021-06-13 DIAGNOSIS — Z7901 Long term (current) use of anticoagulants: Secondary | ICD-10-CM | POA: Diagnosis not present

## 2021-06-13 HISTORY — PX: CARDIOVERSION: SHX1299

## 2021-06-13 LAB — BASIC METABOLIC PANEL
Anion gap: 6 (ref 5–15)
BUN: 19 mg/dL (ref 6–20)
CO2: 26 mmol/L (ref 22–32)
Calcium: 9.2 mg/dL (ref 8.9–10.3)
Chloride: 107 mmol/L (ref 98–111)
Creatinine, Ser: 0.91 mg/dL (ref 0.44–1.00)
GFR, Estimated: >60 mL/min (ref >60)
Glucose, Bld: 94 mg/dL (ref 70–99)
Potassium: 4.2 mmol/L (ref 3.5–5.1)
Sodium: 139 mmol/L (ref 135–145)

## 2021-06-13 LAB — MAGNESIUM: Magnesium: 2.1 mg/dL (ref 1.7–2.4)

## 2021-06-13 SURGERY — CARDIOVERSION
Anesthesia: General

## 2021-06-13 MED ORDER — PROPOFOL 10 MG/ML IV BOLUS
INTRAVENOUS | Status: DC | PRN
  Administered 2021-06-13: 20 mg via INTRAVENOUS
  Administered 2021-06-13: 80 mg via INTRAVENOUS

## 2021-06-13 MED ORDER — SODIUM CHLORIDE 0.9 % IV SOLN: INTRAVENOUS | Status: DC

## 2021-06-13 MED ORDER — LIDOCAINE 2% (20 MG/ML) 5 ML SYRINGE
INTRAMUSCULAR | Status: DC | PRN
  Administered 2021-06-13: 60 mg via INTRAVENOUS

## 2021-06-13 NOTE — Anesthesia Postprocedure Evaluation (Signed)
Anesthesia Post Note  Patient: Tammy Davies  Procedure(s) Performed: CARDIOVERSION     Patient location during evaluation: Endoscopy Anesthesia Type: General Level of consciousness: awake and alert Pain management: pain level controlled Vital Signs Assessment: post-procedure vital signs reviewed and stable Respiratory status: spontaneous breathing, nonlabored ventilation and respiratory function stable Cardiovascular status: blood pressure returned to baseline and stable Postop Assessment: no apparent nausea or vomiting Anesthetic complications: no   No notable events documented.  Last Vitals:  Vitals:   06/13/21 1023 06/13/21 1033  BP: (!) 114/56 (!) 141/76  Pulse: (!) 42 (!) 134  Resp: 17 19  Temp:    SpO2: 95% 96%    Last Pain:  Vitals:   06/13/21 1011  TempSrc: Axillary  PainSc:                  Suren Payne,W. EDMOND

## 2021-06-13 NOTE — Interval H&P Note (Signed)
History and Physical Interval Note:  06/13/2021 9:21 AM  Tammy Davies  has presented today for surgery, with the diagnosis of AFIB.  The various methods of treatment have been discussed with the patient and family. After consideration of risks, benefits and other options for treatment, the patient has consented to  Procedure(s): CARDIOVERSION (N/A) as a surgical intervention.  The patient's history has been reviewed, patient examined, no change in status, stable for surgery.  I have reviewed the patient's chart and labs.  Questions were answered to the patient's satisfaction.     Chandrea Zellman

## 2021-06-13 NOTE — Transfer of Care (Signed)
Immediate Anesthesia Transfer of Care Note  Patient: Tammy Davies  Procedure(s) Performed: CARDIOVERSION  Patient Location: Endoscopy Unit  Anesthesia Type:General  Level of Consciousness: drowsy  Airway & Oxygen Therapy: Patient Spontanous Breathing  Post-op Assessment: Report given to RN  Post vital signs: Reviewed and stable  Last Vitals:  Vitals Value Taken Time  BP 128/97   Temp    Pulse 110   Resp    SpO2 94     Last Pain:  Vitals:   06/13/21 0813  TempSrc: Temporal  PainSc: 0-No pain         Complications: No notable events documented.

## 2021-06-13 NOTE — CV Procedure (Signed)
° °  Electrical Cardioversion Procedure Note Tammy Davies [id number] [birth year]  Procedure: Electrical Cardioversion Indications:  Atrial Fibrillation  Time Out: Verified patient identification, verified procedure,medications/allergies/relevent history reviewed, required imaging and test results available.  Performed  Procedure Details  The patient signed informed consent.   The patient was NPO past midnight. Has had therapeutic anticoagulation with Eliquis greater than 3 weeks. The patient denies any interruption of anticoagulation.  Anesthesia was administered by the anesthesiology team.  Adequate airway was maintained throughout and vital followed per protocol.  He was cardioverted x 1 with 200 J of biphasic synchronized energy.  He converted to NSR.  There were no apparent complications.  The patient tolerated the procedure well and had normal neuro status and respiratory status post procedure with vitals stable as recorded elsewhere.     IMPRESSION:  Successful cardioversion of atrial fibrillation   Follow up: Heart follow-up has been arranged for Richardson Dopp, PA June 17, 2021.  She will continue on current medical therapy.  The patient advised to continue anticoagulation.  Romone Shaff 06/13/2021, 10:17 AM

## 2021-06-13 NOTE — Discharge Instructions (Signed)

## 2021-06-14 ENCOUNTER — Encounter (HOSPITAL_COMMUNITY): Payer: Self-pay | Admitting: Cardiology

## 2021-06-16 NOTE — Progress Notes (Signed)
Cardiology Office Note:    Date:  06/17/2021   ID:  Tammy Davies, DOB [birth year], MRN [id number]  PCP:  [facility] Cardiologist:  Sinclair Grooms, MD Cardiology APP:  Liliane Shi, PA-C  Electrophysiologist:  Vickie Epley, MD     Referring MD: [facility]   Chief Complaint:  F/u for CHF    Patient Profile: Paroxysmal atrial fibrillation  Persistent AF after admx with diverticulitis in 1/22 S/p failed DCCV in 3/22 Dofetilide Rx load 05/2021 s/p DCCV >> ERAF Repeat DCCV on Dofetilide >> ERAF (HFrEF) heart failure with reduced ejection fraction  ?Tachy induced - persistent AF Hypertension  OSA Diverticulitis - admx in 05/2020 S/p Hartmann colectomy w colostomy C/b recurrent AF w RVR +Cigs   Prior CV Studies: Echocardiogram 04/07/21 EF 30-35, global HK, mild LVH, normal RVSF, mild LAE   Echocardiogram 06/09/17 EF 60-65   History of Present Illness:   Tammy Davies is a 61 y.o. female with the above problem list.  She was last seen 1/24 prior to her DCCV.  She was volume overloaded and I placed her on furosemide.   She underwent DCCV 06/13/21.  She returns for f/u.  She is here alone.  She is feeling better.  She is less short of breath.   She still cannot lay on her back.  She has not had any further paroxysmal nocturnal dyspnea.  She has not had chest pain, syncope, leg edema.      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   apixaban (ELIQUIS) 5 MG TABS tablet Take 1 tablet (5 mg total) by mouth 2 (two) times daily.   dofetilide (TIKOSYN) 500 MCG capsule Take 1 capsule (500 mcg total) by mouth 2 (two) times daily.   furosemide (LASIX) 20 MG tablet Take 1 tablet (20 mg total) by mouth daily.   Magnesium Oxide 400 MG CAPS Take 1 capsule (400 mg total) by mouth daily.   metoprolol succinate (TOPROL XL) 100 MG 24 hr tablet Take 1 tablet (100 mg total) by mouth daily. Take with or immediately following a meal.   Multiple Vitamin (MULTIVITAMIN) capsule Take 1 capsule by mouth daily. 50 plus   potassium chloride (KLOR-CON) 10 MEQ tablet Take 1 tablet (10 mEq total) by mouth daily.   sacubitril-valsartan (ENTRESTO) 24-26 MG Take 1 tablet by mouth 2 (two) times daily.   [DISCONTINUED] losartan (COZAAR) 25 MG tablet Take 1 tablet (25 mg total) by mouth daily.    Allergies:   Heparin   Social History   Tobacco Use   Smoking status: Every Day  Types: Cigarettes    Start date: 63   Smokeless tobacco: Never   Tobacco comments:    1 cigarette a day  Vaping Use   Vaping Use: Never used  Substance Use Topics   Alcohol use: No   Drug use: Yes    Types: Marijuana    Comment: last used Christmas    Family Hx: The patient's family history includes Atrial fibrillation in her father and mother; Breast cancer in her mother; Diabetes in her father; Heart failure in her father and mother. There is no history of Colon cancer, Stomach cancer, Esophageal cancer, Pancreatic cancer, or Liver disease.  ROS See HPI  EKGs/Labs/Other Test Reviewed:    EKG:  EKG is   ordered today.  The ekg ordered today demonstrates AFib, HR 97,  Left Bundle Branch Block   Recent Labs: 02/22/2021: ALT 11 06/05/2021: Hemoglobin 15.5; Platelets 201 06/13/2021: BUN 19; Creatinine, Ser 0.91; Magnesium 2.1; Potassium 4.2; Sodium 139   Recent Lipid Panel Recent Labs    06/22/20 0935  TRIG 124     Risk Assessment/Calculations:    CHA2DS2-VASc Score = 3   This indicates a 3.2% annual risk of stroke. The patient's score is based upon: CHF History: 1 HTN History: 1 Diabetes History: 0 Stroke History: 0 Vascular Disease History: 0 Age Score: 0 Gender Score: 1        Physical Exam:    VS:  BP 126/70 (BP Location: Right Arm)    Pulse 97    Ht 5\' 8"  (1.727 m)    Wt 244 lb 12.8 oz (111 kg)    SpO2 98%    BMI 37.22 kg/m     Wt Readings from Last 3 Encounters:  06/17/21 244 lb 12.8 oz (111 kg)  06/13/21 247 lb 12.8 oz (112.4 kg)  06/10/21 248 lb (112.5 kg)    Constitutional:      Appearance: Healthy appearance. Not in distress.  Neck:     Vascular: JVD normal.  Pulmonary:     Effort: Pulmonary effort is normal.     Breath sounds: No wheezing. No rales.  Cardiovascular:     Normal rate. Irregularly irregular rhythm. Normal S1. Normal S2.      Murmurs: There is no murmur.  Edema:    Peripheral edema absent.  Abdominal:     Palpations: Abdomen is soft.  Skin:    General: Skin is warm and dry.  Neurological:     Mental Status: Alert and oriented to person, place and time.     Cranial Nerves: Cranial nerves are intact.         ASSESSMENT & PLAN:   HFrEF (heart failure with reduced ejection fraction) (HCC) Her volume status has improved since her last visit despite her remaining in atrial fibrillation.  She is NYHA IIb.  I think she can tolerate Entresto which should continue to improve her symptoms. Continue Metoprolol succinate DC Losartan Start Entresto 24/26 mg twice daily  BMET today and repeat in 1 week F/u with me in 2 weeks Consider MRA, SGLT2i at f/u  Persistent atrial fibrillation ([place]) She has failed  DCCV x 2 on Dofetilide.  Her HR control is fair.  She has an appt with Dr. Quentin Ore 2/27 for f/u.  I reviewed her case with Dr. Curt Bears (attending MD).  He recommended attempting DCCV again with improved volume status vs just remaining on Dofetilide until she sees Dr. Quentin Ore.  The pt is comfortable attempting DCCV again.   Continue Metoprolol  succinate 100 mg once daily Continue Apixaban 5 mg twice daily  Arrange repeat DCCV in 2 weeks Keep f/u with Dr. Quentin Ore later in Feb  Shared Decision Making/Informed Consent The risks (stroke, cardiac arrhythmias rarely resulting in the need for a temporary or permanent pacemaker, skin irritation or burns and complications associated with conscious sedation including aspiration, arrhythmia, respiratory failure and death), benefits (restoration of normal sinus rhythm) and alternatives of a direct current cardioversion were explained in detail to Tammy Davies and she agrees to proceed.   Hypertension Blood pressure is well controlled.  Continue metoprolol succinate 100 mg daily.  Change losartan to [facility] as noted.  If her blood pressure can tolerate, we will consider MRA/SGLT2 inhibitor at follow-up.         Dispo:  Return in about 2 weeks (around 07/01/2021) for Routine Follow Up, w/ Richardson Dopp, PA-C.   Medication Adjustments/Labs and Tests Ordered: Current medicines are reviewed at length with the patient today.  Concerns regarding medicines are outlined above.  Tests Ordered: Orders Placed This Encounter  Procedures   Basic Metabolic Panel (BMET)   Basic Metabolic Panel (BMET)   Magnesium   EKG 12-Lead   Medication Changes: Meds ordered this encounter  Medications   sacubitril-valsartan (ENTRESTO) 24-26 MG    Sig: Take 1 tablet by mouth 2 (two) times daily.    Dispense:  60 tablet    Refill:  3   Signed, Richardson Dopp, PA-C  06/17/2021 11:26 AM    [facility] [place], [place], [place]  [address] Phone: [phone]; Fax: [phone]

## 2021-06-17 ENCOUNTER — Other Ambulatory Visit: Payer: Self-pay

## 2021-06-17 ENCOUNTER — Ambulatory Visit (INDEPENDENT_AMBULATORY_CARE_PROVIDER_SITE_OTHER): Payer: No Typology Code available for payment source | Admitting: Physician Assistant

## 2021-06-17 ENCOUNTER — Encounter: Payer: Self-pay | Admitting: Physician Assistant

## 2021-06-17 VITALS — BP 126/70 | HR 97 | Ht 68.0 in | Wt 244.8 lb

## 2021-06-17 DIAGNOSIS — I4819 Other persistent atrial fibrillation: Secondary | ICD-10-CM

## 2021-06-17 DIAGNOSIS — I5020 Unspecified systolic (congestive) heart failure: Secondary | ICD-10-CM | POA: Diagnosis not present

## 2021-06-17 DIAGNOSIS — I10 Essential (primary) hypertension: Secondary | ICD-10-CM | POA: Diagnosis not present

## 2021-06-17 LAB — BASIC METABOLIC PANEL
BUN/Creatinine Ratio: 23 (ref 12–28)
BUN: 22 mg/dL (ref 8–27)
CO2: 23 mmol/L (ref 20–29)
Calcium: 10.1 mg/dL (ref 8.7–10.3)
Chloride: 101 mmol/L (ref 96–106)
Creatinine, Ser: 0.96 mg/dL (ref 0.57–1.00)
Glucose: 125 mg/dL — ABNORMAL HIGH (ref 70–99)
Potassium: 4.2 mmol/L (ref 3.5–5.2)
Sodium: 139 mmol/L (ref 134–144)
eGFR: 68 mL/min/{1.73_m2} (ref >59)

## 2021-06-17 MED ORDER — ENTRESTO 24-26 MG PO TABS: 1.0000 | ORAL_TABLET | Freq: Two times a day (BID) | ORAL | 3 refills | Status: DC

## 2021-06-17 NOTE — Patient Instructions (Addendum)
Medication Instructions:   DISCONTINUE Losartan  START Entresto one ( 1) tablet by mouth (24/26 mg ) twice daily.  *If you need a refill on your cardiac medications before your next appointment, please call your pharmacy*   Lab Work:  TODAY!!!!! BMET  Your physician recommends that you return for lab work on Wednesday, February 8. You can come in on the day of your appointment anytime between 7:30-4:30. MAG/BMET   If you have labs (blood work) drawn today and your tests are completely normal, you will receive your results only by: [place] (if you have MyChart) OR A paper copy in the mail If you have any lab test that is abnormal or we need to change your treatment, we will call you to review the results.   Testing/Procedures:  Dear Ms. Trauger, You are scheduled for a Cardioversion on Monday, February 20 with Dr. Harrington Challenger.  Please arrive at the [facility] (Main Entrance A) at [facility]: [address] [place], [place] [address] at 6:30 am. (1 hour prior to procedure unless lab work is needed; if lab work is needed arrive 1.5 hours ahead)  DIET: Nothing to eat or drink after midnight except a sip of water with medications (see medication instructions below)  FYI: For your safety, and to allow [place] to monitor your vital signs accurately during the surgery/procedure we request that   if you have artificial nails, gel coating, SNS etc. Please have those removed prior to your surgery/procedure. Not having the nail coverings /polish removed may result in cancellation or delay of your surgery/procedure.   Medication Instructions: Hold Lasix the am of test.   Continue your anticoagulant: Eliquis.  You will need to continue your anticoagulant after your procedure until you  are told by your  Provider that it is safe to stop  You must have a responsible person to drive you home and stay in the waiting area during your procedure. Failure to do so could result in  cancellation.  Bring your insurance cards.  *Special Note: Every effort is made to have your procedure done on time. Occasionally there are emergencies that occur at the hospital that may cause delays. Please be patient if a delay does occur.     Follow-Up: At [facility], you and your health needs are our priority.  As part of our continuing mission to provide you with exceptional heart care, we have created designated Provider Care Teams.  These Care Teams include your primary Cardiologist (physician) and Advanced Practice Providers (APPs -  Physician Assistants and Nurse Practitioners) who all work together to provide you with the care you need, when you need it.  We recommend signing up for the patient portal called "MyChart".  Sign up information is provided on this After Visit Summary.  MyChart is used to connect with patients for Virtual Visits (Telemedicine).  Patients are able to view lab/test results, encounter notes, upcoming appointments, etc.  Non-urgent messages can be sent to your provider as well.   To learn more about what you can do with MyChart, go to [email or web address].    Your next appointment:   3 week(s)  The format for your next appointment:   In Person  Provider:   Richardson Dopp, PA-C

## 2021-06-17 NOTE — Assessment & Plan Note (Addendum)
She has failed DCCV x 2 on Dofetilide.  Her HR control is fair.  She has an appt with Dr. Quentin Ore 2/27 for f/u.  I reviewed her case with Dr. Curt Bears (attending MD).  He recommended attempting DCCV again with improved volume status vs just remaining on Dofetilide until she sees Dr. Quentin Ore.  The pt is comfortable attempting DCCV again.    Continue Metoprolol succinate 100 mg once daily  Continue Apixaban 5 mg twice daily   Arrange repeat DCCV in 2 weeks  Keep f/u with Dr. Quentin Ore later in Feb  Shared Decision Making/Informed Consent The risks (stroke, cardiac arrhythmias rarely resulting in the need for a temporary or permanent pacemaker, skin irritation or burns and complications associated with conscious sedation including aspiration, arrhythmia, respiratory failure and death), benefits (restoration of normal sinus rhythm) and alternatives of a direct current cardioversion were explained in detail to Tammy Davies and she agrees to proceed.

## 2021-06-17 NOTE — Assessment & Plan Note (Signed)
Blood pressure is well controlled.  Continue metoprolol succinate 100 mg daily.  Change losartan to [facility] as noted.  If her blood pressure can tolerate, we will consider MRA/SGLT2 inhibitor at follow-up.

## 2021-06-17 NOTE — Assessment & Plan Note (Signed)
Her volume status has improved since her last visit despite her remaining in atrial fibrillation.  She is NYHA IIb.  I think she can tolerate Entresto which should continue to improve her symptoms.  Continue Metoprolol succinate  DC Losartan  Start Entresto 24/26 mg twice daily   BMET today and repeat in 1 week  F/u with me in 2 weeks  Consider MRA, SGLT2i at f/u

## 2021-06-24 ENCOUNTER — Other Ambulatory Visit (HOSPITAL_COMMUNITY): Payer: Self-pay

## 2021-06-25 ENCOUNTER — Other Ambulatory Visit: Payer: No Typology Code available for payment source | Admitting: *Deleted

## 2021-06-25 ENCOUNTER — Other Ambulatory Visit: Payer: Self-pay

## 2021-06-25 DIAGNOSIS — I4819 Other persistent atrial fibrillation: Secondary | ICD-10-CM

## 2021-06-25 DIAGNOSIS — I5020 Unspecified systolic (congestive) heart failure: Secondary | ICD-10-CM

## 2021-06-25 DIAGNOSIS — I10 Essential (primary) hypertension: Secondary | ICD-10-CM

## 2021-06-26 LAB — BASIC METABOLIC PANEL
BUN/Creatinine Ratio: 19 (ref 12–28)
BUN: 20 mg/dL (ref 8–27)
CO2: 21 mmol/L (ref 20–29)
Calcium: 9.1 mg/dL (ref 8.7–10.3)
Chloride: 100 mmol/L (ref 96–106)
Creatinine, Ser: 1.07 mg/dL — ABNORMAL HIGH (ref 0.57–1.00)
Glucose: 97 mg/dL (ref 70–99)
Potassium: 4.5 mmol/L (ref 3.5–5.2)
Sodium: 140 mmol/L (ref 134–144)
eGFR: 59 mL/min/{1.73_m2} — ABNORMAL LOW (ref >59)

## 2021-06-26 LAB — MAGNESIUM: Magnesium: 2 mg/dL (ref 1.6–2.3)

## 2021-06-30 ENCOUNTER — Ambulatory Visit: Payer: No Typology Code available for payment source | Admitting: Physician Assistant

## 2021-07-03 NOTE — Progress Notes (Signed)
Cardiology Office Note:    Date:  07/04/2021   ID:  Tammy Davies, DOB 02-16-1961, MRN [id number]  PCP:  [facility] Cardiologist:  Sinclair Grooms, MD Cardiology APP:  Liliane Shi, PA-C  Electrophysiologist:  Vickie Epley, MD    Referring MD: [facility] [place]   Chief Complaint:  Follow-up for CHF, A-fib    Patient Profile: Paroxysmal atrial fibrillation  Persistent AF after admx with diverticulitis in 1/22 S/p failed DCCV in 3/22 Dofetilide Rx load 05/2021 s/p DCCV >> ERAF Repeat DCCV on Dofetilide >> ERAF (HFrEF) heart failure with reduced ejection fraction  ?Tachy induced - persistent AF Hypertension  OSA Diverticulitis - admx in 05/2020 S/p Hartmann colectomy w colostomy C/b recurrent AF w RVR +Cigs   Prior CV Studies: Echocardiogram 04/07/21 EF 30-35, global HK, mild LVH, normal RVSF, mild LAE   Echocardiogram 06/09/17 EF 60-65   History of Present Illness:   Tammy Davies is a 61 y.o. female with the above problem list.  She was last seen 1/31.  She remained in atrial fibrillation.  I switched her ARB to Entresto to further advance GDMT for heart failure.  Given improved volume status, we elected to continue dofetilide and proceed with another cardioversion.  This is scheduled for 07/07/2021.  She is also scheduled to follow-up with Dr. Quentin Ore on 2/27.  She returns for follow-up.  She is here alone.  She continues to feel about the same.  She cannot tell if she is in sinus rhythm or atrial fibrillation.  Her breathing remains improved.  She is NYHA IIb.  She has not had chest pain, syncope, leg edema.  She continues to sleep on an incline.  She has still not received her CPAP machine yet.    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   apixaban (ELIQUIS) 5 MG TABS tablet Take 1 tablet (5 mg total) by mouth 2 (two) times daily.   dofetilide (TIKOSYN) 500 MCG capsule Take 1 capsule (500 mcg total) by mouth 2 (two) times daily.   Magnesium Oxide 400 MG CAPS Take 1 capsule (400 mg total) by mouth daily.   Multiple Vitamin (MULTIVITAMIN) capsule Take 1 capsule by mouth daily. 50 plus   potassium chloride (KLOR-CON) 10 MEQ tablet Take 1 tablet (10 mEq total) by mouth daily.   sacubitril-valsartan (ENTRESTO) 24-26 MG Take 1 tablet by mouth 2 (two) times daily.   [DISCONTINUED] furosemide (LASIX) 20 MG tablet Take 1 tablet (20 mg total) by mouth daily.   [DISCONTINUED] metoprolol succinate (TOPROL XL) 100 MG 24 hr tablet Take 1 tablet (100 mg  total) by mouth daily. Take with or immediately following a meal.    Allergies:   Heparin   Social History   Tobacco Use   Smoking status: Every Day    Types: Cigarettes    Start date: 48   Smokeless tobacco: Never   Tobacco comments:    1 cigarette a day  Vaping Use   Vaping Use: Never used  Substance Use Topics   Alcohol use: No   Drug use: Yes    Types: Marijuana    Comment: last used Christmas    Family Hx: The patient's family history includes Atrial fibrillation in her father and mother; Breast cancer in her mother; Diabetes in her father; Heart failure in her  father and mother. There is no history of Colon cancer, Stomach cancer, Esophageal cancer, Pancreatic cancer, or Liver disease.  ROS see HPI  EKGs/Labs/Other Test Reviewed:    EKG:  EKG is   ordered today.  The ekg ordered today demonstrates NSR, HR 65, left bundle branch block, PACs  Recent Labs: 02/22/2021: ALT 11 06/05/2021: Hemoglobin 15.5; Platelets 201 06/25/2021: BUN 20; Creatinine, Ser 1.07; Magnesium 2.0; Potassium 4.5; Sodium 140   Recent Lipid Panel No results for input(s): CHOL, TRIG, HDL, VLDL, LDLCALC, LDLDIRECT in the last 8760 hours.   Risk Assessment/Calculations:    CHA2DS2-VASc Score = 3   This indicates a 3.2% annual risk of stroke. The patient's score is based upon: CHF History: 1 HTN History: 1 Diabetes History: 0 Stroke History: 0 Vascular Disease History: 0 Age Score: 0 Gender Score: 1        Physical Exam:    VS:  BP 92/60 (BP Location: Right Arm, Patient Position: Sitting, Cuff Size: Large)    Pulse 65    Ht 5\' 8"  (1.727 m)    Wt 248 lb 12.8 oz (112.9 kg)    SpO2 98%    BMI 37.83 kg/m     Wt Readings from Last 3 Encounters:  07/04/21 248 lb 12.8 oz (112.9 kg)  06/17/21 244 lb 12.8 oz (111 kg)  06/13/21 247 lb 12.8 oz (112.4 kg)    Constitutional:      Appearance: Healthy appearance. Not in distress.  Neck:     Vascular: JVD normal.  Pulmonary:     Effort: Pulmonary effort is normal.     Breath sounds: No wheezing. No rales.  Cardiovascular:     Normal rate. Regular rhythm. Normal S1. Normal S2.      Murmurs: There is no murmur.  Edema:    Peripheral edema absent.  Abdominal:     Palpations: Abdomen is soft.  Skin:    General: Skin is warm and dry.  Neurological:     Mental Status: Alert and oriented to person, place and time.     Cranial Nerves: Cranial nerves are intact.        ASSESSMENT & PLAN:   Persistent atrial fibrillation (HCC) She is back in sinus rhythm today.  Symptoms are overall stable.  Continue apixaban 5 mg twice  daily, dofetilide 500 mcg twice daily.  She has noted that her heart rate is slow at times at home and she is weak/dizzy at times.  I will decrease her metoprolol succinate to 50 mg daily.  Keep follow-up with Dr. Quentin Ore next week.  Follow-up with Dr. Tamala Julian or me in 8-12 weeks.  HFrEF (heart failure with reduced ejection fraction) (HCC) EF 30-35.  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  NYHA IIb.  Volume status stable.  Her blood pressure is running somewhat low.  She does note some lightheadedness and dizziness at times.  I will decrease her metoprolol succinate to 50 mg daily as noted.  Continue Entresto 24/26 mg twice daily.  Decrease furosemide to 20 mg every other day.  Continue potassium 10 mEq daily.  Since her blood pressure is low, I will hold off on attempting to start SGLT2 inhibitor, MRA for now.  BMET today.  Follow-up BMET 1 week.  Weigh daily and call if weight increases by 3 pounds or more in 1 day.  Arrange follow-up limited echocardiogram in 6-8 weeks to recheck EF.  Follow-up with Dr. Tamala Julian or me in 8-12 weeks.  Hypertension Blood pressure running somewhat low.  Adjust medications as noted.         Dispo:  Return in about 10 weeks (around 09/12/2021) for Routine Follow Up with Dr. Tamala Julian, or Richardson Dopp, PA-C.   Medication Adjustments/Labs and Tests Ordered: Current medicines are reviewed at length with the patient today.  Concerns regarding medicines are outlined above.  Tests Ordered: Orders Placed This Encounter  Procedures   Basic Metabolic Panel (BMET)   Basic Metabolic Panel (BMET)   EKG 12-Lead   ECHOCARDIOGRAM COMPLETE   Medication Changes: Meds ordered this encounter  Medications   furosemide (LASIX) 20 MG tablet    Sig: Take 1 tablet (20 mg total) by mouth every other day.    Dispense:  15 tablet    Refill:  2   metoprolol succinate (TOPROL XL) 100 MG 24 hr tablet    Sig: Take 0.5 tablets (50 mg total) by mouth daily. Take with or immediately  following a meal.    Dispense:  30 tablet    Refill:  3    D/c cardizem   Signed, Richardson Dopp, PA-C  07/04/2021 9:26 AM    [facility] [place], [place], [place]  [address] Phone: [phone]; Fax: [phone]

## 2021-07-04 ENCOUNTER — Other Ambulatory Visit: Payer: Self-pay

## 2021-07-04 ENCOUNTER — Ambulatory Visit (INDEPENDENT_AMBULATORY_CARE_PROVIDER_SITE_OTHER): Payer: No Typology Code available for payment source | Admitting: Physician Assistant

## 2021-07-04 ENCOUNTER — Encounter: Payer: Self-pay | Admitting: Physician Assistant

## 2021-07-04 VITALS — BP 92/60 | HR 65 | Ht 68.0 in | Wt 248.8 lb

## 2021-07-04 DIAGNOSIS — I5020 Unspecified systolic (congestive) heart failure: Secondary | ICD-10-CM

## 2021-07-04 DIAGNOSIS — I4819 Other persistent atrial fibrillation: Secondary | ICD-10-CM | POA: Diagnosis not present

## 2021-07-04 DIAGNOSIS — I10 Essential (primary) hypertension: Secondary | ICD-10-CM | POA: Diagnosis not present

## 2021-07-04 LAB — BASIC METABOLIC PANEL
BUN/Creatinine Ratio: 19 (ref 12–28)
BUN: 20 mg/dL (ref 8–27)
CO2: 22 mmol/L (ref 20–29)
Calcium: 9.2 mg/dL (ref 8.7–10.3)
Chloride: 104 mmol/L (ref 96–106)
Creatinine, Ser: 1.03 mg/dL — ABNORMAL HIGH (ref 0.57–1.00)
Glucose: 80 mg/dL (ref 70–99)
Potassium: 4.6 mmol/L (ref 3.5–5.2)
Sodium: 139 mmol/L (ref 134–144)
eGFR: 62 mL/min/{1.73_m2} (ref >59)

## 2021-07-04 MED ORDER — FUROSEMIDE 20 MG PO TABS: 20.0000 mg | ORAL_TABLET | ORAL | 2 refills | Status: DC

## 2021-07-04 MED ORDER — METOPROLOL SUCCINATE ER 100 MG PO TB24: 50.0000 mg | ORAL_TABLET | Freq: Every day | ORAL | 3 refills | Status: DC

## 2021-07-04 NOTE — Assessment & Plan Note (Signed)
She is back in sinus rhythm today.  Symptoms are overall stable.  Continue apixaban 5 mg twice daily, dofetilide 500 mcg twice daily.  She has noted that her heart rate is slow at times at home and she is weak/dizzy at times.  I will decrease her metoprolol succinate to 50 mg daily.  Keep follow-up with Dr. Quentin Ore next week.  Follow-up with Dr. Tamala Julian or me in 8-12 weeks.

## 2021-07-04 NOTE — Assessment & Plan Note (Addendum)
EF 30-35.  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  NYHA IIb.  Volume status stable.  Her blood pressure is running somewhat low.  She does note some lightheadedness and dizziness at times.  I will decrease her metoprolol succinate to 50 mg daily as noted.  Continue Entresto 24/26 mg twice daily.  Decrease furosemide to 20 mg every other day.  Continue potassium 10 mEq daily.  Since her blood pressure is low, I will hold off on attempting to start SGLT2 inhibitor, MRA for now.  BMET today.  Follow-up BMET 1 week.  Weigh daily and call if weight increases by 3 pounds or more in 1 day.  Arrange follow-up limited echocardiogram in 6-8 weeks to recheck EF.  Follow-up with Dr. Tamala Julian or me in 8-12 weeks.

## 2021-07-04 NOTE — Assessment & Plan Note (Signed)
Blood pressure running somewhat low.  Adjust medications as noted.

## 2021-07-04 NOTE — Patient Instructions (Signed)
Medication Instructions:   DECREASE Lasix one (1) tablet by mouth ( 20 mg) every other day.  DECREASE Toprol one half tablet by mouth ( 50 mg) daily.  *If you need a refill on your cardiac medications before your next appointment, please call your pharmacy*   Lab Work:  TODAY!!!!!  BMET  Your physician recommends that you return for lab work SAME DAY AS DR. Quentin Ore APPT. February 27.    If you have labs (blood work) drawn today and your tests are completely normal, you will receive your results only by: [place] (if you have MyChart) OR A paper copy in the mail If you have any lab test that is abnormal or we need to change your treatment, we will call you to review the results.   Testing/Procedures:  Your physician has requested that you have an echocardiogram. Echocardiography is a painless test that uses sound waves to create images of your heart. It provides your doctor with information about the size and shape of your heart and how well your hearts chambers and valves are working. This procedure takes approximately one hour. There are no restrictions for this procedure.    Follow-Up: At [facility], you and your health needs are our priority.  As part of our continuing mission to provide you with exceptional heart care, we have created designated Provider Care Teams.  These Care Teams include your primary Cardiologist (physician) and Advanced Practice Providers (APPs -  Physician Assistants and Nurse Practitioners) who all work together to provide you with the care you need, when you need it.  We recommend signing up for the patient portal called "MyChart".  Sign up information is provided on this After Visit Summary.  MyChart is used to connect with patients for Virtual Visits (Telemedicine).  Patients are able to view lab/test results, encounter notes, upcoming appointments, etc.  Non-urgent messages can be sent to your provider as well.   To learn more about what you  can do with MyChart, go to [email or web address].    Your next appointment:   9 week(s)  The format for your next appointment:   In Person  Provider:   Sinclair Grooms, MD     Other Instructions  Weigh daily if your weight up 3 lbs in 24 hours call or send mychart message.

## 2021-07-07 ENCOUNTER — Encounter (HOSPITAL_COMMUNITY): Admission: RE | Payer: Self-pay | Source: Home / Self Care

## 2021-07-07 ENCOUNTER — Ambulatory Visit (HOSPITAL_COMMUNITY)
Admission: RE | Admit: 2021-07-07 | Payer: No Typology Code available for payment source | Source: Home / Self Care | Admitting: Internal Medicine

## 2021-07-07 SURGERY — CARDIOVERSION
Anesthesia: General

## 2021-07-14 ENCOUNTER — Other Ambulatory Visit: Payer: No Typology Code available for payment source

## 2021-07-14 ENCOUNTER — Ambulatory Visit (INDEPENDENT_AMBULATORY_CARE_PROVIDER_SITE_OTHER): Payer: No Typology Code available for payment source | Admitting: Cardiology

## 2021-07-14 ENCOUNTER — Other Ambulatory Visit: Payer: Self-pay

## 2021-07-14 ENCOUNTER — Encounter: Payer: Self-pay | Admitting: Cardiology

## 2021-07-14 VITALS — BP 122/70 | HR 144 | Ht 68.0 in | Wt 242.0 lb

## 2021-07-14 DIAGNOSIS — I483 Typical atrial flutter: Secondary | ICD-10-CM

## 2021-07-14 DIAGNOSIS — I5020 Unspecified systolic (congestive) heart failure: Secondary | ICD-10-CM

## 2021-07-14 DIAGNOSIS — I4819 Other persistent atrial fibrillation: Secondary | ICD-10-CM

## 2021-07-14 DIAGNOSIS — G4733 Obstructive sleep apnea (adult) (pediatric): Secondary | ICD-10-CM

## 2021-07-14 DIAGNOSIS — I4891 Unspecified atrial fibrillation: Secondary | ICD-10-CM

## 2021-07-14 LAB — CBC WITH DIFFERENTIAL/PLATELET
Basophils Absolute: 0 10*3/uL (ref 0.0–0.2)
Basos: 0 %
EOS (ABSOLUTE): 0.2 10*3/uL (ref 0.0–0.4)
Eos: 2 %
Hematocrit: 49.9 % — ABNORMAL HIGH (ref 34.0–46.6)
Hemoglobin: 16.4 g/dL — ABNORMAL HIGH (ref 11.1–15.9)
Lymphocytes Absolute: 2.6 10*3/uL (ref 0.7–3.1)
Lymphs: 31 %
MCH: 29.3 pg (ref 26.6–33.0)
MCHC: 32.9 g/dL (ref 31.5–35.7)
MCV: 89 fL (ref 79–97)
Monocytes Absolute: 0.6 10*3/uL (ref 0.1–0.9)
Monocytes: 7 %
Neutrophils Absolute: 5 10*3/uL (ref 1.4–7.0)
Neutrophils: 60 %
Platelets: 215 10*3/uL (ref 150–450)
RBC: 5.6 x10E6/uL — ABNORMAL HIGH (ref 3.77–5.28)
RDW: 15.7 % — ABNORMAL HIGH (ref 11.7–15.4)
WBC: 8.5 10*3/uL (ref 3.4–10.8)

## 2021-07-14 LAB — BASIC METABOLIC PANEL
BUN/Creatinine Ratio: 20 (ref 12–28)
BUN: 25 mg/dL (ref 8–27)
CO2: 28 mmol/L (ref 20–29)
Calcium: 9.6 mg/dL (ref 8.7–10.3)
Chloride: 102 mmol/L (ref 96–106)
Creatinine, Ser: 1.24 mg/dL — ABNORMAL HIGH (ref 0.57–1.00)
Glucose: 92 mg/dL (ref 70–99)
Potassium: 4.8 mmol/L (ref 3.5–5.2)
Sodium: 138 mmol/L (ref 134–144)
eGFR: 50 mL/min/{1.73_m2} — ABNORMAL LOW (ref >59)

## 2021-07-14 MED ORDER — METOPROLOL TARTRATE 100 MG PO TABS: ORAL_TABLET | ORAL | 0 refills | Status: DC

## 2021-07-14 NOTE — Progress Notes (Signed)
Electrophysiology Office Note:    Date:  07/14/2021   ID:  Tammy Davies, DOB 04/19/1961, MRN [id number]  PCP:  [facility], [place] Cardiologist:  Sinclair Grooms, MD  [place] Electrophysiologist:  Vickie Epley, MD   Referring MD: [facility], [facility]   Chief Complaint: Follow-up  History of Present Illness:    Tammy Davies is a 61 y.o. female who presents for follow-up. Their medical history includes HFrEF, persistent atrial fibrillation, hypertension, OSA, and tobacco abuse.  Tammy Davies is s/p dofetilide loading 1/9-1/12/23 with DCCV on 05/28/21. She converted to Afib 4 days later. Tammy Davies underwent cardioversion on 06/13/2021. She was cardioverted x 1 with 200 J of biphasic synchronized energy and converted to NSR.   She followed up with Richardson Dopp PA-C 06/17/2021 and she was noted to be in Atrial fibrillation with heart rate 97, LBBB. Later on 07/04/2021 she was found to be back in sinus rhythm, but she reported slow heart rates and feeling weak and dizzy at times. Metoprolol was decreased to 50 mg.  Overall, she is feeling alright. She is unable to tell when her heart rate is elevated without a monitor. Lately her heart rate has been low when she checks it at home. She is surprised at her faster rate in clinic today. Earlier this morning it was 73 bpm. Currently she is feeling a little dizzy, and she notes having shortness of breath while walking into the office today.  Later this week she will be fitted for a CPAP.  She denies any chest pain, or peripheral edema. No headaches, syncope, orthopnea, or PND.     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) Echocardiogram 1/19: EF 60-65   History of COVID-19    Hypertension 06/08/2017   OSA (obstructive sleep apnea)    Persistent atrial fibrillation ([place])    Persistent since admx w diverticulitis in 1/22  //  failed DCCV in 3/22  //  Dofetilide load in 1/23 >> failed DCCV x 2   Tobacco abuse     Past Surgical History:  Procedure Laterality Date   ABDOMINAL HYSTERECTOMY     CARDIOVERSION N/A 07/22/2020   Procedure: CARDIOVERSION;  Surgeon: [name], MD;  Location: [facility] ENDOSCOPY;  Service: Cardiovascular;  Laterality: N/A;   CARDIOVERSION N/A 05/28/2021   Procedure: CARDIOVERSION;  Surgeon: Geralynn Rile, MD;  Location: [place];  Service: Cardiovascular;  Laterality: N/A;   CARDIOVERSION N/A 06/13/2021   Procedure: CARDIOVERSION;  Surgeon: Berniece Salines, DO;  Location: [place] ENDOSCOPY;  Service: Cardiovascular;  Laterality: N/A;   CHOLECYSTECTOMY N/A 08/18/2016   Procedure: LAPAROSCOPIC CHOLECYSTECTOMY WITH  INTRAOPERATIVE CHOLANGIOGRAM;  Surgeon: Johnathan Hausen, MD;  Location: [facility] ORS;  Service: General;  Laterality: N/A;   COLECTOMY WITH COLOSTOMY CREATION/HARTMANN PROCEDURE N/A 06/18/2020   Procedure: SIGMOID COLECTOMY WITH COLOSTOMY CREATION; DRAINAGE OF PELVIC ABSCESS;  Surgeon: Rolm Bookbinder, MD;  Location: [place];  Service: General;  Laterality: N/A;   CYSTOSCOPY WITH STENT PLACEMENT Bilateral 06/18/2020   Procedure: CYSTOSCOPY WITH STENT PLACEMENT;  Surgeon: Alexis Frock, MD;  Location: [place];  Service: Urology;  Laterality: Bilateral;   LAPAROTOMY N/A 06/18/2020   Procedure:  EXPLORATORY LAPAROTOMY;  Surgeon: Rolm Bookbinder, MD;  Location: [place];  Service: General;  Laterality: N/A;    Current Medications: Current Meds  Medication Sig   acetaminophen (TYLENOL) 500 MG tablet Take 1,500 mg by mouth every 6 (six) hours as needed for mild pain, fever or headache.   apixaban (ELIQUIS) 5 MG TABS tablet Take 1 tablet (5 mg total) by mouth  2 (two) times daily.   dofetilide (TIKOSYN) 500 MCG capsule Take 1 capsule (500 mcg total) by mouth 2 (two) times daily.   furosemide (LASIX) 20 MG tablet Take 1 tablet (20 mg total) by mouth every other day.   Magnesium Oxide 400 MG CAPS Take 1 capsule (400 mg total) by mouth daily.   metoprolol succinate (TOPROL XL) 100 MG 24 hr tablet Take 0.5 tablets (50 mg total) by mouth daily. Take with or immediately following a meal.   metoprolol tartrate (LOPRESSOR) 100 MG tablet Take one tablet by mouth 2 hours prior to cardiac CT   Multiple Vitamin (MULTIVITAMIN) capsule Take 1 capsule by mouth daily. 50 plus   potassium chloride (KLOR-CON) 10 MEQ tablet Take 1 tablet (10 mEq total) by mouth daily.   sacubitril-valsartan (ENTRESTO) 24-26 MG Take 1 tablet by mouth 2 (two) times daily.     Allergies:   Heparin   Social History   Socioeconomic History   Marital status: Single    Spouse name: Not on file   Number of children: Not on file   Years of education: Not on file   Highest education level: Not on file  Occupational History   Not on file  Tobacco Use   Smoking status: Every Day    Types: Cigarettes    Start date: 66   Smokeless tobacco: Never   Tobacco comments:    1 cigarette a day  Vaping Use   Vaping Use: Never used  Substance and Sexual Activity   Alcohol use: No   Drug use: Yes    Types: Marijuana    Comment: last used Christmas   Sexual activity: Never  Other Topics Concern   Not on file  Social History Narrative   Not on file   Social Determinants of Health   Financial Resource Strain: Not on file  Food Insecurity: Not on file  Transportation Needs: Not on file  Physical Activity: Not on file  Stress: Not on file  Social Connections: Not on file     Family History: The patient's family history includes Atrial fibrillation in her father and mother; Breast cancer in her mother; Diabetes in her father; Heart failure in her father and mother. There is no  history of Colon cancer, Stomach cancer, Esophageal cancer, Pancreatic cancer, or Liver disease.  ROS:   Please see the history of present illness.    (+) Dizziness (+) Shortness of breath All other systems reviewed and are negative.  EKGs/Labs/Other Studies Reviewed:    The following studies were reviewed today:  Echo 04/07/2021:  1. Left ventricular ejection fraction, by estimation, is 30 to 35%. The  left ventricle has moderately decreased function. The left ventricle  demonstrates global hypokinesis. The left ventricular internal cavity size  was severely dilated. There is mild  left ventricular hypertrophy. Left ventricular diastolic parameters are  indeterminate.   2. Right ventricular systolic function is normal. The right ventricular  size is normal. Tricuspid regurgitation signal is inadequate for assessing  PA pressure.   3. Left atrial size was mildly dilated.   4. The  mitral valve is normal in structure. No evidence of mitral valve  regurgitation.   5. The aortic valve is tricuspid. Aortic valve regurgitation is not  visualized. No aortic stenosis is present.   6. The inferior vena cava is dilated in size with >50% respiratory  variability, suggesting right atrial pressure of 8 mmHg.   EKG:   EKG is personally reviewed.  07/14/2021: Atrial fibrillation/flutter with rapid ventricular rates.  Heart rate 144 bpm.   Recent Labs: 02/22/2021: ALT 11 06/25/2021: Magnesium 2.0 07/14/2021: BUN 25; Creatinine, Ser 1.24; Hemoglobin 16.4; Platelets 215; Potassium 4.8; Sodium 138   Recent Lipid Panel    Component Value Date/Time   CHOL 173 06/08/2017 0819   TRIG 124 06/22/2020 0935   HDL 40 (L) 06/08/2017 0819   CHOLHDL 4.3 06/08/2017 0819   VLDL 23 06/08/2017 0819   LDLCALC 110 (H) 06/08/2017 0819    Physical Exam:    VS:  BP 122/70    Pulse (!) 144    Ht 5\' 8"  (1.727 m)    Wt 242 lb (109.8 kg)    SpO2 96%    BMI 36.80 kg/m     Wt Readings from Last 3 Encounters:   07/14/21 242 lb (109.8 kg)  07/04/21 248 lb 12.8 oz (112.9 kg)  06/17/21 244 lb 12.8 oz (111 kg)     GEN: Well nourished, well developed in no acute distress HEENT: Normal NECK: No JVD; No carotid bruits LYMPHATICS: No lymphadenopathy CARDIAC: Tachycardic, irregularly irregular, no murmurs, rubs, gallops RESPIRATORY:  Clear to auscultation without rales, wheezing or rhonchi  ABDOMEN: Soft, non-tender, non-distended MUSCULOSKELETAL:  No edema; No deformity  SKIN: Warm and dry NEUROLOGIC:  Alert and oriented x 3 PSYCHIATRIC:  Normal affect       ASSESSMENT:    1. Persistent atrial fibrillation ([place])   2. Typical atrial flutter ([place])   3. Atrial fibrillation, unspecified type ([place])   4. HFrEF (heart failure with reduced ejection fraction) ([place])   5. OSA (obstructive sleep apnea)    PLAN:    In order of problems listed above:  #Persistent atrial fibrillation Symptomatic.  Poorly rate controlled during today's visit.  On Eliquis for stroke prophylaxis.  On Tikosyn twice daily.  She will require an urgent cardioversion.  I will get her scheduled in my procedural day tomorrow to have this done.  She has not missed any doses of Eliquis in at least the last 4 weeks.  I have asked her to not eat after midnight.  I discussed the risks of cardioversion with the patient and she wishes to proceed.  She will also be scheduled for atrial fibrillation ablation.  I discussed the ablation in detail with the patient during today's visit include the risks, recovery and likelihood of success.  She wishes to proceed.  Ablation strategy will be PVI plus posterior wall plus CTI.  Risk, benefits, and alternatives to EP study and radiofrequency ablation for afib were also discussed in detail today. These risks include but are not limited to stroke, bleeding, vascular damage, tamponade, perforation, damage to the esophagus, lungs, and other structures, pulmonary vein stenosis, worsening renal function,  and death. The patient understands these risk and wishes to proceed.  We will therefore proceed with catheter ablation at the next available time.  Carto, ICE, anesthesia are requested for the procedure.  Will also obtain CT PV protocol prior to the procedure to exclude LAA thrombus and further evaluate atrial anatomy.  #Chronic systolic heart failure NYHA class  II-III.  Warm and dry on exam.  Continue Entresto, Toprol, Lasix, and Tikosyn.  Rhythm control is indicated as above.  #Obstructive sleep apnea Has a CPAP mask fitting scheduled for this week.  We discussed the link between untreated sleep apnea and poor ablation/antiarrhythmic treatment outcomes during today's visit.   Total time spent with patient today 45 minutes. This includes reviewing records, evaluating the patient and coordinating care.  Medication Adjustments/Labs and Tests Ordered: Current medicines are reviewed at length with the patient today.  Concerns regarding medicines are outlined above.  Orders Placed This Encounter  Procedures   CT CARDIAC MORPH/PULM VEIN W/CM&W/O CA SCORE   Basic Metabolic Panel (BMET)   CBC w/Diff   Basic Metabolic Panel (BMET)   CBC w/Diff   EKG 12-Lead   Meds ordered this encounter  Medications   metoprolol tartrate (LOPRESSOR) 100 MG tablet    Sig: Take one tablet by mouth 2 hours prior to cardiac CT    Dispense:  1 tablet    Refill:  0    I,Mathew Stumpf,acting as a scribe for Vickie Epley, MD.,have documented all relevant documentation on the behalf of Vickie Epley, MD,as directed by  Vickie Epley, MD while in the presence of Vickie Epley, MD.  I, Vickie Epley, MD, have reviewed all documentation for this visit. The documentation on 07/14/21 for the exam, diagnosis, procedures, and orders are all accurate and complete.   Signed, Hilton Cork. Quentin Ore, MD, [facility], [facility] 07/14/2021 9:39 PM    Electrophysiology [facility]

## 2021-07-14 NOTE — H&P (View-Only) (Signed)
Electrophysiology Office Note:    Date:  07/14/2021   ID:  Tammy Davies, DOB 1961/04/20, MRN [id number]  PCP:  [facility], [place] Cardiologist:  Sinclair Grooms, MD  [place] Electrophysiologist:  Vickie Epley, MD   Referring MD: [facility], [facility]   Chief Complaint: Follow-up  History of Present Illness:    Tammy Davies is a 61 y.o. female who presents for follow-up. Their medical history includes HFrEF, persistent atrial fibrillation, hypertension, OSA, and tobacco abuse.  Tammy Davies is s/p dofetilide loading 1/9-1/12/23 with DCCV on 05/28/21. She converted to Afib 4 days later. Tammy Davies underwent cardioversion on 06/13/2021. She was cardioverted x 1 with 200 J of biphasic synchronized energy and converted to NSR.   She followed up with Richardson Dopp PA-C 06/17/2021 and she was noted to be in Atrial fibrillation with heart rate 97, LBBB. Later on 07/04/2021 she was found to be back in sinus rhythm, but she reported slow heart rates and feeling weak and dizzy at times. Metoprolol was decreased to 50 mg.  Overall, she is feeling alright. She is unable to tell when her heart rate is elevated without a monitor. Lately her heart rate has been low when she checks it at home. She is surprised at her faster rate in clinic today. Earlier this morning it was 73 bpm. Currently she is feeling a little dizzy, and she notes having shortness of breath while walking into the office today.  Later this week she will be fitted for a CPAP.  She denies any chest pain, or peripheral edema. No headaches, syncope, orthopnea, or PND.     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) Echocardiogram 1/19: EF 60-65   History of COVID-19    Hypertension 06/08/2017   OSA (obstructive sleep apnea)    Persistent atrial fibrillation ([place])    Persistent since admx w diverticulitis in 1/22  //  failed DCCV in 3/22  //  Dofetilide load in 1/23 >> failed DCCV x 2   Tobacco abuse     Past Surgical History:  Procedure Laterality Date   ABDOMINAL HYSTERECTOMY     CARDIOVERSION N/A 07/22/2020   Procedure: CARDIOVERSION;  Surgeon: [name], MD;  Location: [facility] ENDOSCOPY;  Service: Cardiovascular;  Laterality: N/A;   CARDIOVERSION N/A 05/28/2021   Procedure: CARDIOVERSION;  Surgeon: Geralynn Rile, MD;  Location: [place];  Service: Cardiovascular;  Laterality: N/A;   CARDIOVERSION N/A 06/13/2021   Procedure: CARDIOVERSION;  Surgeon: Berniece Salines, DO;  Location: [place] ENDOSCOPY;  Service: Cardiovascular;  Laterality: N/A;   CHOLECYSTECTOMY N/A 08/18/2016   Procedure: LAPAROSCOPIC CHOLECYSTECTOMY WITH  INTRAOPERATIVE CHOLANGIOGRAM;  Surgeon: Johnathan Hausen, MD;  Location: [facility] ORS;  Service: General;  Laterality: N/A;   COLECTOMY WITH COLOSTOMY CREATION/HARTMANN PROCEDURE N/A 06/18/2020   Procedure: SIGMOID COLECTOMY WITH COLOSTOMY CREATION; DRAINAGE OF PELVIC ABSCESS;  Surgeon: Rolm Bookbinder, MD;  Location: [place];  Service: General;  Laterality: N/A;   CYSTOSCOPY WITH STENT PLACEMENT Bilateral 06/18/2020   Procedure: CYSTOSCOPY WITH STENT PLACEMENT;  Surgeon: Alexis Frock, MD;  Location: [place];  Service: Urology;  Laterality: Bilateral;   LAPAROTOMY N/A 06/18/2020   Procedure:  EXPLORATORY LAPAROTOMY;  Surgeon: Rolm Bookbinder, MD;  Location: [place];  Service: General;  Laterality: N/A;    Current Medications: Current Meds  Medication Sig   acetaminophen (TYLENOL) 500 MG tablet Take 1,500 mg by mouth every 6 (six) hours as needed for mild pain, fever or headache.   apixaban (ELIQUIS) 5 MG TABS tablet Take 1 tablet (5 mg total) by mouth  2 (two) times daily.   dofetilide (TIKOSYN) 500 MCG capsule Take 1 capsule (500 mcg total) by mouth 2 (two) times daily.   furosemide (LASIX) 20 MG tablet Take 1 tablet (20 mg total) by mouth every other day.   Magnesium Oxide 400 MG CAPS Take 1 capsule (400 mg total) by mouth daily.   metoprolol succinate (TOPROL XL) 100 MG 24 hr tablet Take 0.5 tablets (50 mg total) by mouth daily. Take with or immediately following a meal.   metoprolol tartrate (LOPRESSOR) 100 MG tablet Take one tablet by mouth 2 hours prior to cardiac CT   Multiple Vitamin (MULTIVITAMIN) capsule Take 1 capsule by mouth daily. 50 plus   potassium chloride (KLOR-CON) 10 MEQ tablet Take 1 tablet (10 mEq total) by mouth daily.   sacubitril-valsartan (ENTRESTO) 24-26 MG Take 1 tablet by mouth 2 (two) times daily.     Allergies:   Heparin   Social History   Socioeconomic History   Marital status: Single    Spouse name: Not on file   Number of children: Not on file   Years of education: Not on file   Highest education level: Not on file  Occupational History   Not on file  Tobacco Use   Smoking status: Every Day    Types: Cigarettes    Start date: 82   Smokeless tobacco: Never   Tobacco comments:    1 cigarette a day  Vaping Use   Vaping Use: Never used  Substance and Sexual Activity   Alcohol use: No   Drug use: Yes    Types: Marijuana    Comment: last used Christmas   Sexual activity: Never  Other Topics Concern   Not on file  Social History Narrative   Not on file   Social Determinants of Health   Financial Resource Strain: Not on file  Food Insecurity: Not on file  Transportation Needs: Not on file  Physical Activity: Not on file  Stress: Not on file  Social Connections: Not on file     Family History: The patient's family history includes Atrial fibrillation in her father and mother; Breast cancer in her mother; Diabetes in her father; Heart failure in her father and mother. There is no  history of Colon cancer, Stomach cancer, Esophageal cancer, Pancreatic cancer, or Liver disease.  ROS:   Please see the history of present illness.    (+) Dizziness (+) Shortness of breath All other systems reviewed and are negative.  EKGs/Labs/Other Studies Reviewed:    The following studies were reviewed today:  Echo 04/07/2021:  1. Left ventricular ejection fraction, by estimation, is 30 to 35%. The  left ventricle has moderately decreased function. The left ventricle  demonstrates global hypokinesis. The left ventricular internal cavity size  was severely dilated. There is mild  left ventricular hypertrophy. Left ventricular diastolic parameters are  indeterminate.   2. Right ventricular systolic function is normal. The right ventricular  size is normal. Tricuspid regurgitation signal is inadequate for assessing  PA pressure.   3. Left atrial size was mildly dilated.   4. The  mitral valve is normal in structure. No evidence of mitral valve  regurgitation.   5. The aortic valve is tricuspid. Aortic valve regurgitation is not  visualized. No aortic stenosis is present.   6. The inferior vena cava is dilated in size with >50% respiratory  variability, suggesting right atrial pressure of 8 mmHg.   EKG:   EKG is personally reviewed.  07/14/2021: Atrial fibrillation/flutter with rapid ventricular rates.  Heart rate 144 bpm.   Recent Labs: 02/22/2021: ALT 11 06/25/2021: Magnesium 2.0 07/14/2021: BUN 25; Creatinine, Ser 1.24; Hemoglobin 16.4; Platelets 215; Potassium 4.8; Sodium 138   Recent Lipid Panel    Component Value Date/Time   CHOL 173 06/08/2017 0819   TRIG 124 06/22/2020 0935   HDL 40 (L) 06/08/2017 0819   CHOLHDL 4.3 06/08/2017 0819   VLDL 23 06/08/2017 0819   LDLCALC 110 (H) 06/08/2017 0819    Physical Exam:    VS:  BP 122/70    Pulse (!) 144    Ht 5\' 8"  (1.727 m)    Wt 242 lb (109.8 kg)    SpO2 96%    BMI 36.80 kg/m     Wt Readings from Last 3 Encounters:   07/14/21 242 lb (109.8 kg)  07/04/21 248 lb 12.8 oz (112.9 kg)  06/17/21 244 lb 12.8 oz (111 kg)     GEN: Well nourished, well developed in no acute distress HEENT: Normal NECK: No JVD; No carotid bruits LYMPHATICS: No lymphadenopathy CARDIAC: Tachycardic, irregularly irregular, no murmurs, rubs, gallops RESPIRATORY:  Clear to auscultation without rales, wheezing or rhonchi  ABDOMEN: Soft, non-tender, non-distended MUSCULOSKELETAL:  No edema; No deformity  SKIN: Warm and dry NEUROLOGIC:  Alert and oriented x 3 PSYCHIATRIC:  Normal affect       ASSESSMENT:    1. Persistent atrial fibrillation ([place])   2. Typical atrial flutter ([place])   3. Atrial fibrillation, unspecified type ([place])   4. HFrEF (heart failure with reduced ejection fraction) ([place])   5. OSA (obstructive sleep apnea)    PLAN:    In order of problems listed above:  #Persistent atrial fibrillation Symptomatic.  Poorly rate controlled during today's visit.  On Eliquis for stroke prophylaxis.  On Tikosyn twice daily.  She will require an urgent cardioversion.  I will get her scheduled in my procedural day tomorrow to have this done.  She has not missed any doses of Eliquis in at least the last 4 weeks.  I have asked her to not eat after midnight.  I discussed the risks of cardioversion with the patient and she wishes to proceed.  She will also be scheduled for atrial fibrillation ablation.  I discussed the ablation in detail with the patient during today's visit include the risks, recovery and likelihood of success.  She wishes to proceed.  Ablation strategy will be PVI plus posterior wall plus CTI.  Risk, benefits, and alternatives to EP study and radiofrequency ablation for afib were also discussed in detail today. These risks include but are not limited to stroke, bleeding, vascular damage, tamponade, perforation, damage to the esophagus, lungs, and other structures, pulmonary vein stenosis, worsening renal function,  and death. The patient understands these risk and wishes to proceed.  We will therefore proceed with catheter ablation at the next available time.  Carto, ICE, anesthesia are requested for the procedure.  Will also obtain CT PV protocol prior to the procedure to exclude LAA thrombus and further evaluate atrial anatomy.  #Chronic systolic heart failure NYHA class  II-III.  Warm and dry on exam.  Continue Entresto, Toprol, Lasix, and Tikosyn.  Rhythm control is indicated as above.  #Obstructive sleep apnea Has a CPAP mask fitting scheduled for this week.  We discussed the link between untreated sleep apnea and poor ablation/antiarrhythmic treatment outcomes during today's visit.   Total time spent with patient today 45 minutes. This includes reviewing records, evaluating the patient and coordinating care.  Medication Adjustments/Labs and Tests Ordered: Current medicines are reviewed at length with the patient today.  Concerns regarding medicines are outlined above.  Orders Placed This Encounter  Procedures   CT CARDIAC MORPH/PULM VEIN W/CM&W/O CA SCORE   Basic Metabolic Panel (BMET)   CBC w/Diff   Basic Metabolic Panel (BMET)   CBC w/Diff   EKG 12-Lead   Meds ordered this encounter  Medications   metoprolol tartrate (LOPRESSOR) 100 MG tablet    Sig: Take one tablet by mouth 2 hours prior to cardiac CT    Dispense:  1 tablet    Refill:  0    I,Mathew Stumpf,acting as a scribe for Vickie Epley, MD.,have documented all relevant documentation on the behalf of Vickie Epley, MD,as directed by  Vickie Epley, MD while in the presence of Vickie Epley, MD.  I, Vickie Epley, MD, have reviewed all documentation for this visit. The documentation on 07/14/21 for the exam, diagnosis, procedures, and orders are all accurate and complete.   Signed, Hilton Cork. Quentin Ore, MD, [facility], [facility] 07/14/2021 9:39 PM    Electrophysiology [facility]

## 2021-07-14 NOTE — Patient Instructions (Addendum)
Medication Instructions:  Your physician recommends that you continue on your current medications as directed. Please refer to the Current Medication list given to you today. *If you need a refill on your cardiac medications before your next appointment, please call your pharmacy*  Lab Work: You will get STAT lab work today:  CBC and BMP If you have labs (blood work) drawn today and your tests are completely normal, you will receive your results only by: MyChart Message (if you have MyChart) OR A paper copy in the mail If you have any lab test that is abnormal or we need to change your treatment, we will call you to review the results.  Testing/Procedures: Your physician has recommended that you have a Cardioversion (DCCV). Electrical Cardioversion uses a jolt of electricity to your heart either through paddles or wired patches attached to your chest. This is a controlled, usually prescheduled, procedure. Defibrillation is done under light anesthesia in the hospital, and you usually go home the day of the procedure. This is done to get your heart back into a normal rhythm. You are not awake for the procedure. Please see the instruction sheet given to you today.  Follow-Up:  You will follow up with AFIB clinic 2-3 weeks after your cardioversion.  At [facility], you and your health needs are our priority.  As part of our continuing mission to provide you with exceptional heart care, we have created designated Provider Care Teams.  These Care Teams include your primary Cardiologist (physician) and Advanced Practice Providers (APPs -  Physician Assistants and Nurse Practitioners) who all work together to provide you with the care you need, when you need it.  SEE INSTRUCTION LETTERS  Cardiac Ablation Cardiac ablation is a procedure to destroy, or ablate, a small amount of heart tissue in very specific places. The heart has many electrical connections. Sometimes these connections are abnormal  and can cause the heart to beat very fast or irregularly. Ablating some of the areas that cause problems can improve the heart's rhythm or return it to normal. Ablation may be done for people who: Have Wolff-Parkinson-White syndrome. Have fast heart rhythms (tachycardia). Have taken medicines for an abnormal heart rhythm (arrhythmia) that were not effective or caused side effects. Have a high-risk heartbeat that may be life-threatening. During the procedure, a small incision is made in the neck or the groin, and a long, thin tube (catheter) is inserted into the incision and moved to the heart. Small devices (electrodes) on the tip of the catheter will send out electrical currents. A type of X-ray (fluoroscopy) will be used to help guide the catheter and to provide images of the heart. Tell a health care provider about: Any allergies you have. All medicines you are taking, including vitamins, herbs, eye drops, creams, and over-the-counter medicines. Any problems you or family members have had with anesthetic medicines. Any blood disorders you have. Any surgeries you have had. Any medical conditions you have, such as kidney failure. Whether you are pregnant or may be pregnant. What are the risks? Generally, this is a safe procedure. However, problems may occur, including: Infection. Bruising and bleeding at the catheter insertion site. Bleeding into the chest, especially into the sac that surrounds the heart. This is a serious complication. Stroke or blood clots. Damage to nearby structures or organs. Allergic reaction to medicines or dyes. Need for a permanent pacemaker if the normal electrical system is damaged. A pacemaker is a small computer that sends electrical signals to the  heart and helps your heart beat normally. The procedure not being fully effective. This may not be recognized until months later. Repeat ablation procedures are sometimes done. What happens before the  procedure? Medicines Ask your health care provider about: Changing or stopping your regular medicines. This is especially important if you are taking diabetes medicines or blood thinners. Taking medicines such as aspirin and ibuprofen. These medicines can thin your blood. Do not take these medicines unless your health care provider tells you to take them. Taking over-the-counter medicines, vitamins, herbs, and supplements. General instructions Follow instructions from your health care provider about eating or drinking restrictions. Plan to have someone take you home from the hospital or clinic. If you will be going home right after the procedure, plan to have someone with you for 24 hours. Ask your health care provider what steps will be taken to prevent infection. What happens during the procedure?  An IV will be inserted into one of your veins. You will be given a medicine to help you relax (sedative). The skin on your neck or groin will be numbed. An incision will be made in your neck or your groin. A needle will be inserted through the incision and into a large vein in your neck or groin. A catheter will be inserted into the needle and moved to your heart. Dye may be injected through the catheter to help your surgeon see the area of the heart that needs treatment. Electrical currents will be sent from the catheter to ablate heart tissue in desired areas. There are three types of energy that may be used to do this: Heat (radiofrequency energy). Laser energy. Extreme cold (cryoablation). When the tissue has been ablated, the catheter will be removed. Pressure will be held on the insertion area to prevent a lot of bleeding. A bandage (dressing) will be placed over the insertion area. The exact procedure may vary among health care providers and hospitals. What happens after the procedure? Your blood pressure, heart rate, breathing rate, and blood oxygen level will be monitored until you  leave the hospital or clinic. Your insertion area will be monitored for bleeding. You will need to lie still for a few hours to ensure that you do not bleed from the insertion area. Do not drive for 24 hours or as long as told by your health care provider. Summary Cardiac ablation is a procedure to destroy, or ablate, a small amount of heart tissue using an electrical current. This procedure can improve the heart rhythm or return it to normal. Tell your health care provider about any medical conditions you may have and all medicines you are taking to treat them. This is a safe procedure, but problems may occur. Problems may include infection, bruising, damage to nearby organs or structures, or allergic reactions to medicines. Follow your health care provider's instructions about eating and drinking before the procedure. You may also be told to change or stop some of your medicines. After the procedure, do not drive for 24 hours or as long as told by your health care provider. This information is not intended to replace advice given to you by your health care provider. Make sure you discuss any questions you have with your health care provider. Document Revised: 03/13/2019 Document Reviewed: 03/13/2019 Elsevier Patient Education  [place].

## 2021-07-15 ENCOUNTER — Encounter (HOSPITAL_COMMUNITY)
Admission: RE | Disposition: A | Discharge status: Home / Self Care | Payer: Self-pay | Source: Home / Self Care | Attending: Cardiology

## 2021-07-15 ENCOUNTER — Ambulatory Visit (HOSPITAL_COMMUNITY)
Admission: RE | Admit: 2021-07-15 | Discharge: 2021-07-15 | Disposition: A | Discharge status: Home / Self Care | Payer: No Typology Code available for payment source | Attending: Cardiology | Admitting: Cardiology

## 2021-07-15 ENCOUNTER — Ambulatory Visit (HOSPITAL_BASED_OUTPATIENT_CLINIC_OR_DEPARTMENT_OTHER): Payer: No Typology Code available for payment source | Admitting: Critical Care Medicine

## 2021-07-15 ENCOUNTER — Other Ambulatory Visit: Payer: Self-pay

## 2021-07-15 ENCOUNTER — Ambulatory Visit (HOSPITAL_COMMUNITY): Payer: No Typology Code available for payment source | Admitting: Critical Care Medicine

## 2021-07-15 DIAGNOSIS — I4891 Unspecified atrial fibrillation: Secondary | ICD-10-CM

## 2021-07-15 DIAGNOSIS — F17210 Nicotine dependence, cigarettes, uncomplicated: Secondary | ICD-10-CM | POA: Insufficient documentation

## 2021-07-15 DIAGNOSIS — I483 Typical atrial flutter: Secondary | ICD-10-CM | POA: Diagnosis not present

## 2021-07-15 DIAGNOSIS — Z79899 Other long term (current) drug therapy: Secondary | ICD-10-CM | POA: Diagnosis not present

## 2021-07-15 DIAGNOSIS — G4733 Obstructive sleep apnea (adult) (pediatric): Secondary | ICD-10-CM | POA: Diagnosis not present

## 2021-07-15 DIAGNOSIS — I4819 Other persistent atrial fibrillation: Secondary | ICD-10-CM | POA: Diagnosis not present

## 2021-07-15 DIAGNOSIS — I10 Essential (primary) hypertension: Secondary | ICD-10-CM | POA: Diagnosis not present

## 2021-07-15 DIAGNOSIS — I5022 Chronic systolic (congestive) heart failure: Secondary | ICD-10-CM | POA: Diagnosis not present

## 2021-07-15 DIAGNOSIS — I110 Hypertensive heart disease with heart failure: Secondary | ICD-10-CM | POA: Insufficient documentation

## 2021-07-15 HISTORY — PX: CARDIOVERSION: EP1203

## 2021-07-15 LAB — PROTIME-INR
INR: 1.2 (ref 0.8–1.2)
Prothrombin Time: 15 seconds (ref 11.4–15.2)

## 2021-07-15 SURGERY — CARDIOVERSION (CATH LAB)
Anesthesia: General

## 2021-07-15 MED ORDER — PROPOFOL 10 MG/ML IV BOLUS
INTRAVENOUS | Status: DC | PRN
  Administered 2021-07-15: 40 mg via INTRAVENOUS
  Administered 2021-07-15: 60 mg via INTRAVENOUS

## 2021-07-15 MED ORDER — ONDANSETRON HCL 4 MG/2ML IJ SOLN: 4.0000 mg | Freq: Four times a day (QID) | INTRAMUSCULAR | Status: DC | PRN

## 2021-07-15 MED ORDER — SODIUM CHLORIDE 0.9% FLUSH
3.0000 mL | INTRAVENOUS | Status: DC | PRN
Start: 2021-07-15 — End: 2021-07-15

## 2021-07-15 MED ORDER — LIDOCAINE 2% (20 MG/ML) 5 ML SYRINGE
INTRAMUSCULAR | Status: DC | PRN
  Administered 2021-07-15: 60 mg via INTRAVENOUS

## 2021-07-15 MED ORDER — SODIUM CHLORIDE 0.9% FLUSH: 3.0000 mL | Freq: Two times a day (BID) | INTRAVENOUS | Status: DC

## 2021-07-15 MED ORDER — SODIUM CHLORIDE 0.9 % IV SOLN
250.0000 mL | INTRAVENOUS | Status: DC | PRN
Start: 2021-07-15 — End: 2021-07-15

## 2021-07-15 MED ORDER — ACETAMINOPHEN 325 MG PO TABS
650.0000 mg | ORAL_TABLET | ORAL | Status: DC | PRN
  Filled 2021-07-15: qty 2

## 2021-07-15 MED ORDER — SODIUM CHLORIDE 0.9 % IV SOLN: INTRAVENOUS | Status: DC

## 2021-07-15 SURGICAL SUPPLY — 1 items: PAD DEFIB RADIO PHYSIO CONN (PAD) ×1 IMPLANT

## 2021-07-15 NOTE — Interval H&P Note (Signed)
History and Physical Interval Note:  07/15/2021 1:39 PM  Tammy Davies  has presented today for surgery, with the diagnosis of afib.  The various methods of treatment have been discussed with the patient and family. After consideration of risks, benefits and other options for treatment, the patient has consented to  Procedure(s): CARDIOVERSION (N/A) as a surgical intervention.  The patient's history has been reviewed, patient examined, no change in status, stable for surgery.  I have reviewed the patient's chart and labs.  Questions were answered to the patient's satisfaction.     Psalm Schappell T Dorothea Yow

## 2021-07-15 NOTE — Progress Notes (Signed)
Per Dr Quentin Ore OK to d/c at 41

## 2021-07-15 NOTE — Anesthesia Procedure Notes (Signed)
Procedure Name: MAC Date/Time: 07/15/2021 2:21 PM Performed by: Kyung Rudd, CRNA Pre-anesthesia Checklist: Patient identified, Emergency Drugs available, Suction available and Patient being monitored Patient Re-evaluated:Patient Re-evaluated prior to induction Oxygen Delivery Method: Ambu bag Preoxygenation: Pre-oxygenation with 100% oxygen Induction Type: IV induction Ventilation: Mask ventilation without difficulty Tube secured with: Tape Dental Injury: Teeth and Oropharynx as per pre-operative assessment

## 2021-07-15 NOTE — Transfer of Care (Signed)
Immediate Anesthesia Transfer of Care Note  Patient: Tammy Davies  Procedure(s) Performed: CARDIOVERSION  Patient Location: Cath Lab  Anesthesia Type:MAC  Level of Consciousness: awake, alert  and oriented  Airway & Oxygen Therapy: Patient Spontanous Breathing  Post-op Assessment: Report given to RN, Post -op Vital signs reviewed and stable and Patient moving all extremities  Post vital signs: Reviewed and stable  Last Vitals:  Vitals Value Taken Time  BP    Temp    Pulse    Resp    SpO2      Last Pain:  Vitals:   07/15/21 1304  TempSrc:   PainSc: 0-No pain         Complications: No notable events documented.

## 2021-07-15 NOTE — Anesthesia Preprocedure Evaluation (Signed)
Anesthesia Evaluation  Patient identified by MRN, date of birth, ID band Patient awake    Reviewed: Allergy & Precautions, H&P , NPO status , Patient's Chart, lab work & pertinent test results  Airway Mallampati: II   Neck ROM: full    Dental   Pulmonary Current Smoker,    breath sounds clear to auscultation       Cardiovascular hypertension, + dysrhythmias Atrial Fibrillation  Rhythm:irregular Rate:Normal     Neuro/Psych    GI/Hepatic   Endo/Other    Renal/GU      Musculoskeletal   Abdominal   Peds  Hematology   Anesthesia Other Findings   Reproductive/Obstetrics                             Anesthesia Physical Anesthesia Plan  ASA: 3  Anesthesia Plan: General   Post-op Pain Management:    Induction: Intravenous  PONV Risk Score and Plan: 2 and Propofol infusion and Treatment may vary due to age or medical condition  Airway Management Planned: Mask  Additional Equipment:   Intra-op Plan:   Post-operative Plan:   Informed Consent: I have reviewed the patients History and Physical, chart, labs and discussed the procedure including the risks, benefits and alternatives for the proposed anesthesia with the patient or authorized representative who has indicated his/her understanding and acceptance.     Dental advisory given  Plan Discussed with: CRNA, Anesthesiologist and Surgeon  Anesthesia Plan Comments:         Anesthesia Quick Evaluation

## 2021-07-16 ENCOUNTER — Encounter (HOSPITAL_COMMUNITY): Payer: Self-pay | Admitting: Cardiology

## 2021-07-16 NOTE — Anesthesia Postprocedure Evaluation (Signed)
Anesthesia Post Note  Patient: Tammy Davies  Procedure(s) Performed: CARDIOVERSION     Patient location during evaluation: PACU Anesthesia Type: General Level of consciousness: awake and alert Pain management: pain level controlled Vital Signs Assessment: post-procedure vital signs reviewed and stable Respiratory status: spontaneous breathing, nonlabored ventilation, respiratory function stable and patient connected to nasal cannula oxygen Cardiovascular status: blood pressure returned to baseline and stable Postop Assessment: no apparent nausea or vomiting Anesthetic complications: no   No notable events documented.  Last Vitals:  Vitals:   07/15/21 1505 07/15/21 1532  BP:  104/64  Pulse: 66   Resp: 16 17  Temp: (!) 36.2 C   SpO2:      Last Pain:  Vitals:   07/15/21 1505  TempSrc: Temporal  PainSc: 0-No pain                 Sharren Schnurr S

## 2021-07-30 ENCOUNTER — Ambulatory Visit (HOSPITAL_COMMUNITY): Payer: No Typology Code available for payment source | Admitting: Physician Assistant

## 2021-08-02 ENCOUNTER — Emergency Department (HOSPITAL_BASED_OUTPATIENT_CLINIC_OR_DEPARTMENT_OTHER): Payer: No Typology Code available for payment source

## 2021-08-02 ENCOUNTER — Other Ambulatory Visit: Payer: Self-pay

## 2021-08-02 ENCOUNTER — Observation Stay (HOSPITAL_BASED_OUTPATIENT_CLINIC_OR_DEPARTMENT_OTHER): Payer: No Typology Code available for payment source

## 2021-08-02 ENCOUNTER — Encounter (HOSPITAL_BASED_OUTPATIENT_CLINIC_OR_DEPARTMENT_OTHER): Payer: Self-pay | Admitting: Emergency Medicine

## 2021-08-02 ENCOUNTER — Observation Stay (HOSPITAL_BASED_OUTPATIENT_CLINIC_OR_DEPARTMENT_OTHER)
Admission: EM | Admit: 2021-08-02 | Discharge: 2021-08-03 | Disposition: A | Discharge status: Home / Self Care | Payer: No Typology Code available for payment source | Attending: Internal Medicine | Admitting: Internal Medicine

## 2021-08-02 DIAGNOSIS — I4819 Other persistent atrial fibrillation: Principal | ICD-10-CM | POA: Insufficient documentation

## 2021-08-02 DIAGNOSIS — N179 Acute kidney failure, unspecified: Secondary | ICD-10-CM | POA: Diagnosis not present

## 2021-08-02 DIAGNOSIS — I110 Hypertensive heart disease with heart failure: Secondary | ICD-10-CM | POA: Diagnosis not present

## 2021-08-02 DIAGNOSIS — Z79899 Other long term (current) drug therapy: Secondary | ICD-10-CM | POA: Insufficient documentation

## 2021-08-02 DIAGNOSIS — Z7901 Long term (current) use of anticoagulants: Secondary | ICD-10-CM

## 2021-08-02 DIAGNOSIS — I5020 Unspecified systolic (congestive) heart failure: Secondary | ICD-10-CM | POA: Diagnosis not present

## 2021-08-02 DIAGNOSIS — E669 Obesity, unspecified: Secondary | ICD-10-CM | POA: Diagnosis present

## 2021-08-02 DIAGNOSIS — F17210 Nicotine dependence, cigarettes, uncomplicated: Secondary | ICD-10-CM | POA: Insufficient documentation

## 2021-08-02 DIAGNOSIS — Z8616 Personal history of COVID-19: Secondary | ICD-10-CM | POA: Insufficient documentation

## 2021-08-02 DIAGNOSIS — I4891 Unspecified atrial fibrillation: Secondary | ICD-10-CM | POA: Diagnosis not present

## 2021-08-02 DIAGNOSIS — Z20822 Contact with and (suspected) exposure to covid-19: Secondary | ICD-10-CM | POA: Insufficient documentation

## 2021-08-02 DIAGNOSIS — Z720 Tobacco use: Secondary | ICD-10-CM | POA: Diagnosis present

## 2021-08-02 DIAGNOSIS — R0602 Shortness of breath: Secondary | ICD-10-CM | POA: Diagnosis present

## 2021-08-02 LAB — ECHOCARDIOGRAM COMPLETE
AR max vel: 3.25 cm2
AV Area VTI: 2.74 cm2
AV Area mean vel: 3.16 cm2
AV Mean grad: 6 mmHg
AV Peak grad: 10.9 mmHg
Ao pk vel: 1.65 m/s
Height: 68 in
S' Lateral: 4.2 cm
Weight: 3990.4 oz

## 2021-08-02 LAB — TROPONIN I (HIGH SENSITIVITY)
Troponin I (High Sensitivity): 17 ng/L (ref <18)
Troponin I (High Sensitivity): 15 ng/L (ref <18)

## 2021-08-02 LAB — BASIC METABOLIC PANEL
Anion gap: 9 (ref 5–15)
Anion gap: 11 (ref 5–15)
BUN: 31 mg/dL — ABNORMAL HIGH (ref 6–20)
BUN: 28 mg/dL — ABNORMAL HIGH (ref 6–20)
CO2: 27 mmol/L (ref 22–32)
CO2: 22 mmol/L (ref 22–32)
Calcium: 10.7 mg/dL — ABNORMAL HIGH (ref 8.9–10.3)
Calcium: 9.3 mg/dL (ref 8.9–10.3)
Chloride: 104 mmol/L (ref 98–111)
Chloride: 104 mmol/L (ref 98–111)
Creatinine, Ser: 1.5 mg/dL — ABNORMAL HIGH (ref 0.44–1.00)
Creatinine, Ser: 1.17 mg/dL — ABNORMAL HIGH (ref 0.44–1.00)
GFR, Estimated: 40 mL/min — ABNORMAL LOW (ref >60)
GFR, Estimated: 53 mL/min — ABNORMAL LOW (ref >60)
Glucose, Bld: 128 mg/dL — ABNORMAL HIGH (ref 70–99)
Glucose, Bld: 115 mg/dL — ABNORMAL HIGH (ref 70–99)
Potassium: 3.7 mmol/L (ref 3.5–5.1)
Potassium: 3.9 mmol/L (ref 3.5–5.1)
Sodium: 140 mmol/L (ref 135–145)
Sodium: 137 mmol/L (ref 135–145)

## 2021-08-02 LAB — CBC WITH DIFFERENTIAL/PLATELET
Abs Immature Granulocytes: 0.02 10*3/uL (ref 0.00–0.07)
Basophils Absolute: 0.1 10*3/uL (ref 0.0–0.1)
Basophils Relative: 1 %
Eosinophils Absolute: 0.3 10*3/uL (ref 0.0–0.5)
Eosinophils Relative: 3 %
HCT: 50.6 % — ABNORMAL HIGH (ref 36.0–46.0)
Hemoglobin: 16.3 g/dL — ABNORMAL HIGH (ref 12.0–15.0)
Immature Granulocytes: 0 %
Lymphocytes Relative: 33 %
Lymphs Abs: 2.8 10*3/uL (ref 0.7–4.0)
MCH: 28.9 pg (ref 26.0–34.0)
MCHC: 32.2 g/dL (ref 30.0–36.0)
MCV: 89.7 fL (ref 80.0–100.0)
Monocytes Absolute: 0.6 10*3/uL (ref 0.1–1.0)
Monocytes Relative: 7 %
Neutro Abs: 4.7 10*3/uL (ref 1.7–7.7)
Neutrophils Relative %: 56 %
Platelets: 241 10*3/uL (ref 150–400)
RBC: 5.64 MIL/uL — ABNORMAL HIGH (ref 3.87–5.11)
RDW: 15.5 % (ref 11.5–15.5)
WBC: 8.5 10*3/uL (ref 4.0–10.5)
nRBC: 0 % (ref 0.0–0.2)

## 2021-08-02 LAB — HEPATIC FUNCTION PANEL
ALT: 13 U/L (ref 0–44)
AST: 15 U/L (ref 15–41)
Albumin: 3.1 g/dL — ABNORMAL LOW (ref 3.5–5.0)
Alkaline Phosphatase: 93 U/L (ref 38–126)
Bilirubin, Direct: <0.1 mg/dL (ref 0.0–0.2)
Total Bilirubin: 0.5 mg/dL (ref 0.3–1.2)
Total Protein: 5.8 g/dL — ABNORMAL LOW (ref 6.5–8.1)

## 2021-08-02 LAB — RESP PANEL BY RT-PCR (FLU A&B, COVID) ARPGX2
Influenza A by PCR: NEGATIVE
Influenza B by PCR: NEGATIVE
SARS Coronavirus 2 by RT PCR: NEGATIVE

## 2021-08-02 LAB — TSH: TSH: 5.714 u[IU]/mL — ABNORMAL HIGH (ref 0.350–4.500)

## 2021-08-02 LAB — LACTIC ACID, PLASMA
Lactic Acid, Venous: 1.1 mmol/L (ref 0.5–1.9)
Lactic Acid, Venous: 1.3 mmol/L (ref 0.5–1.9)

## 2021-08-02 LAB — MAGNESIUM: Magnesium: 1.8 mg/dL (ref 1.7–2.4)

## 2021-08-02 LAB — BRAIN NATRIURETIC PEPTIDE: B Natriuretic Peptide: 220.9 pg/mL — ABNORMAL HIGH (ref 0.0–100.0)

## 2021-08-02 MED ORDER — METOPROLOL SUCCINATE ER 50 MG PO TB24
75.0000 mg | ORAL_TABLET | Freq: Every day | ORAL | Status: DC
  Administered 2021-08-02 – 2021-08-03 (×2): 75 mg via ORAL
  Filled 2021-08-02 (×2): qty 1

## 2021-08-02 MED ORDER — DILTIAZEM HCL-DEXTROSE 125-5 MG/125ML-% IV SOLN (PREMIX)
5.0000 mg/h | INTRAVENOUS | Status: DC
  Administered 2021-08-02: 5 mg/h via INTRAVENOUS
  Filled 2021-08-02: qty 125

## 2021-08-02 MED ORDER — SODIUM CHLORIDE 0.9 % IV BOLUS
500.0000 mL | Freq: Once | INTRAVENOUS | Status: AC
  Administered 2021-08-02: 500 mL via INTRAVENOUS

## 2021-08-02 MED ORDER — ACETAMINOPHEN 325 MG PO TABS
650.0000 mg | ORAL_TABLET | ORAL | Status: DC | PRN
  Administered 2021-08-03: 650 mg via ORAL
  Filled 2021-08-02: qty 2

## 2021-08-02 MED ORDER — DOFETILIDE 500 MCG PO CAPS: 500.0000 ug | ORAL_CAPSULE | Freq: Two times a day (BID) | ORAL | Status: DC

## 2021-08-02 MED ORDER — MAGNESIUM OXIDE -MG SUPPLEMENT 400 (240 MG) MG PO TABS
400.0000 mg | ORAL_TABLET | Freq: Every day | ORAL | Status: DC
Start: 2021-08-02 — End: 2021-08-03
  Administered 2021-08-02 – 2021-08-03 (×2): 400 mg via ORAL
  Filled 2021-08-02 (×2): qty 1

## 2021-08-02 MED ORDER — METOPROLOL SUCCINATE ER 50 MG PO TB24: 50.0000 mg | ORAL_TABLET | Freq: Every day | ORAL | Status: DC

## 2021-08-02 MED ORDER — PERFLUTREN LIPID MICROSPHERE
1.0000 mL | INTRAVENOUS | Status: AC | PRN
  Administered 2021-08-02: 4 mL via INTRAVENOUS
  Filled 2021-08-02: qty 10

## 2021-08-02 MED ORDER — DILTIAZEM LOAD VIA INFUSION
10.0000 mg | Freq: Once | INTRAVENOUS | Status: AC
Start: 2021-08-02 — End: 2021-08-02
  Administered 2021-08-02: 10 mg via INTRAVENOUS
  Filled 2021-08-02: qty 10

## 2021-08-02 MED ORDER — SODIUM CHLORIDE 0.9 % IV BOLUS: 250.0000 mL | Freq: Once | INTRAVENOUS | Status: DC

## 2021-08-02 MED ORDER — AMIODARONE HCL 200 MG PO TABS
400.0000 mg | ORAL_TABLET | Freq: Two times a day (BID) | ORAL | Status: DC
  Administered 2021-08-02 – 2021-08-03 (×3): 400 mg via ORAL
  Filled 2021-08-02 (×3): qty 2

## 2021-08-02 MED ORDER — ONDANSETRON HCL 4 MG/2ML IJ SOLN: 4.0000 mg | Freq: Four times a day (QID) | INTRAMUSCULAR | Status: DC | PRN

## 2021-08-02 MED ORDER — AMIODARONE HCL IN DEXTROSE 360-4.14 MG/200ML-% IV SOLN: 30.0000 mg/h | INTRAVENOUS | Status: DC

## 2021-08-02 MED ORDER — AMIODARONE HCL IN DEXTROSE 360-4.14 MG/200ML-% IV SOLN
60.0000 mg/h | INTRAVENOUS | Status: DC
Start: 2021-08-02 — End: 2021-08-02
  Administered 2021-08-02: 60 mg/h via INTRAVENOUS
  Filled 2021-08-02: qty 200

## 2021-08-02 MED ORDER — AMIODARONE IV BOLUS ONLY 150 MG/100ML
150.0000 mg | Freq: Once | INTRAVENOUS | Status: AC
  Administered 2021-08-02: 150 mg via INTRAVENOUS
  Filled 2021-08-02: qty 100

## 2021-08-02 MED ORDER — APIXABAN 5 MG PO TABS
5.0000 mg | ORAL_TABLET | Freq: Two times a day (BID) | ORAL | Status: DC
  Administered 2021-08-02 – 2021-08-03 (×3): 5 mg via ORAL
  Filled 2021-08-02 (×3): qty 1

## 2021-08-02 MED ORDER — METOPROLOL SUCCINATE ER 100 MG PO TB24
100.0000 mg | ORAL_TABLET | Freq: Every day | ORAL | Status: DC
Start: 2021-08-02 — End: 2021-08-02

## 2021-08-02 NOTE — H&P (Signed)
?Cardiology Admission History and Physical:  ? ?Patient ID: Tammy Davies ?MRN: [id number]; DOB: [birth year]  ? ?Admission date: 08/02/2021 ? ?PCP:  [facility] ?  ?[facility] Providers ?Cardiologist:  Sinclair Grooms, MD  ?Cardiology APP:  Liliane Shi, PA-C  ?Electrophysiologist:  Vickie Epley, MD     ? ? ?Chief Complaint:  atrial fibrillation RVR ? ?Patient Profile:  ? ?Tammy Davies is a 61 y.o. female with persistent atrial fibrillation CHADSVASC of 3 despite tikosyn and three cardioversions this year, on eliquis and metoprolol, HFrEF, HTN  who is being seen 08/02/2021 for the evaluation of atrial fibrillation with rapid ventricular response. ? ?History of Present Illness:  ? ?Tammy Davies has been in her normal state of health . On Thursday she did an 8 hour drive and had 3 cups of coffee and multiple pepsis which is more caffeine then typical for her. She developed dizziness/lightheadedness on Friday while at work. Normally cannot tell when she is in afib. No fevers, chills, constitutional symptoms, diarrhea, no bleeding, no chest pain or overt palpitations. No weight gain, orthopnea, PND. Presented to [facility] ED and found to be in afib RVR to the 150s. Given diltiazem gtt which improved HR but caused hypotension so stopped. Started on amiodarone bolus/gtt. Remains in mostly rate controlled atrial fibrillation ?She is scheduled for PVI/CTI and Watchman implantation in May with Dr Quentin Ore ?Loaded with tokosyn in early January. Not on antiarrhythmics before. Previously on metoprolol 100, decreased to 50 due to subjective lightheadedness ? ?Past Medical History:  ?Diagnosis Date  ? Diverticulitis   ? Admx in 1/22 >> s/p Hartmann colectomy w colostomy (c/b AF w RVR)  ? HFrEF (heart failure with reduced ejection fraction) ([place]) 06/09/2021  ? 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 History of COVID-19   ? Hypertension 06/08/2017  ? OSA (obstructive sleep apnea)   ? Persistent atrial fibrillation ([place])   ? Persistent since admx w diverticulitis in 1/22  //  failed DCCV in 3/22  //  Dofetilide load in 1/23 >> failed DCCV x 2  ? Tobacco abuse   ? ? ?Past Surgical History:  ?Procedure Laterality Date  ? ABDOMINAL HYSTERECTOMY    ? CARDIOVERSION N/A 07/22/2020  ? Procedure: CARDIOVERSION;  Surgeon: [name], MD;  Location: [place];  Service: Cardiovascular;  Laterality: N/A;  ? CARDIOVERSION N/A 05/28/2021  ? Procedure: CARDIOVERSION;  Surgeon: Geralynn Rile, MD;  Location: [place];  Service: Cardiovascular;  Laterality: N/A;  ? CARDIOVERSION N/A 06/13/2021  ? Procedure: CARDIOVERSION;  Surgeon: Berniece Salines, DO;  Location: [place];  Service: Cardiovascular;  Laterality: N/A;  ? CARDIOVERSION N/A 07/15/2021  ? Procedure: CARDIOVERSION;  Surgeon: Vickie Epley, MD;  Location: [place] [facility];  Service: Cardiovascular;  Laterality: N/A;  ? CHOLECYSTECTOMY N/A 08/18/2016  ? Procedure: LAPAROSCOPIC CHOLECYSTECTOMY WITH  INTRAOPERATIVE CHOLANGIOGRAM;  Surgeon: Johnathan Hausen, MD;  Location: [facility];  Service: General;  Laterality: N/A;  ? COLECTOMY WITH COLOSTOMY CREATION/HARTMANN PROCEDURE N/A 06/18/2020  ? Procedure: SIGMOID COLECTOMY WITH COLOSTOMY CREATION; DRAINAGE OF PELVIC ABSCESS;  Surgeon: Rolm Bookbinder, MD;  Location: [place];  Service: General;  Laterality: N/A;  ? CYSTOSCOPY WITH STENT PLACEMENT Bilateral 06/18/2020  ? Procedure: CYSTOSCOPY WITH STENT PLACEMENT;  Surgeon: Alexis Frock, MD;  Location: [place];  Service: Urology;  Laterality: Bilateral;  ? LAPAROTOMY  N/A 06/18/2020  ? Procedure: EXPLORATORY LAPAROTOMY;  Surgeon: Rolm Bookbinder, MD;  Location: [place];  Service: General;  Laterality: N/A;  ?  ? ?Medications Prior to Admission: ?Prior to Admission medications   ?Medication Sig Start Date End  Date Taking? Authorizing Provider  ?acetaminophen (TYLENOL) 500 MG tablet Take 1,500 mg by mouth every 6 (six) hours as needed for mild pain, fever or headache.    [provider]  ?apixaban (ELIQUIS) 5 MG TABS tablet Take 1 tablet (5 mg total) by mouth 2 (two) times daily. 08/23/20   Belva Crome, MD  ?dofetilide (TIKOSYN) 500 MCG capsule Take 1 capsule (500 mcg total) by mouth 2 (two) times daily. 05/29/21   Shirley Friar, PA-C  ?furosemide (LASIX) 20 MG tablet Take 1 tablet (20 mg total) by mouth every other day. 07/04/21 10/02/21  Richardson Dopp T, PA-C  ?Magnesium Oxide 400 MG CAPS Take 1 capsule (400 mg total) by mouth daily. 06/11/21   Richardson Dopp T, PA-C  ?metoprolol succinate (TOPROL XL) 100 MG 24 hr tablet Take 0.5 tablets (50 mg total) by mouth daily. Take with or immediately following a meal. 07/04/21 07/04/22  Richardson Dopp T, PA-C  ?metoprolol tartrate (LOPRESSOR) 100 MG tablet Take one tablet by mouth 2 hours prior to cardiac CT 07/14/21   Vickie Epley, MD  ?Multiple Vitamin (MULTIVITAMIN) capsule Take 1 capsule by mouth daily. 50 plus    [provider]  ?potassium chloride (KLOR-CON) 10 MEQ tablet Take 1 tablet (10 mEq total) by mouth daily. 06/10/21 09/08/21  Richardson Dopp T, PA-C  ?sacubitril-valsartan (ENTRESTO) 24-26 MG Take 1 tablet by mouth 2 (two) times daily. 06/17/21   Richardson Dopp T, PA-C  ?  ? ?Allergies:    ?Allergies  ?Allergen Reactions  ? Heparin Other (See Comments)  ?  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 ? ?Social History:   ?Social History  ? ?Socioeconomic History  ? Marital status: Single  ?  Spouse name: Not on file  ? Number of children: Not on file  ? Years of education: Not on file  ? Highest education level: Not on file  ?Occupational History  ? Not on file  ?Tobacco Use  ? Smoking status: Every Day  ?  Types: Cigarettes  ?  Start date: 67  ? Smokeless tobacco: Never  ? Tobacco comments:  ?  1 cigarette a day   ?Vaping Use  ? Vaping Use: Never used  ?Substance and Sexual Activity  ? Alcohol use: No  ? Drug use: Yes  ?  Types: Marijuana  ?  Comment: last used Christmas  ? Sexual activity: Never  ?Other Topics Concern  ? Not on file  ?Social History Narrative  ? Not on file  ? ?Social Determinants of Health  ? ?Financial Resource Strain: Not on file  ?Food Insecurity: Not on file  ?Transportation Needs: Not on file  ?Physical Activity: Not on file  ?Stress: Not on file  ?Social Connections: Not on file  ?Intimate Partner Violence: Not on file  ?  ?Family History:   ?The patient's family history includes Atrial fibrillation in her father and mother; Breast cancer in her mother; Diabetes in her father; Heart failure in her father and mother. There is no history of Colon cancer, Stomach cancer, Esophageal cancer, Pancreatic cancer, or Liver disease.   ? ?ROS:  ?Please see the history of present illness.  ?All other ROS reviewed  and negative.    ? ?Physical Exam/Data:  ? ?Vitals:  ? 08/02/21 0315 08/02/21 0342 08/02/21 0345 08/02/21 0444  ?BP: 100/77 93/72 99/80  114/73  ?Pulse: (!) 43 71 (!) 41 74  ?Resp: 20 20 (!) 21 18  ?Temp:    (!) 97.4 ?F (36.3 ?C)  ?TempSrc:    Oral  ?SpO2: 94% 95% 96% 91%  ?Weight:    113.1 kg  ?Height:    5' 8"  (1.727 m)  ? ?No intake or output data in the 24 hours ending 08/02/21 0544 ?Last 3 Weights 08/02/2021 08/02/2021 07/15/2021  ?Weight (lbs) 249 lb 6.4 oz 248 lb 248 lb  ?Weight (kg) 113.127 kg 112.492 kg 112.492 kg  ?   ?Body mass index is 37.92 kg/m?.  ?General:  Well nourished, well developed, in no acute distress ?HEENT: normal ?Neck: no JVD ?Vascular: No carotid bruits; Distal pulses 2+ bilaterally   ?Cardiac:  normal S1, S2; RRR; no murmur  ?Lungs:  clear to auscultation bilaterally, no wheezing, rhonchi or rales  ?Abd: soft, nontender, no hepatomegaly  ?Ext: no edema ?Musculoskeletal:  No deformities, BUE and BLE strength normal and equal ?Skin: warm and dry  ?Neuro:  CNs 2-12 intact, no focal  abnormalities noted ?Psych:  Normal affect  ? ? ?EKG:  The ECG that was done  was personally reviewed and demonstrates atrial fibrillation with RVR ? ? ?Laboratory Data: ? ?High Sensitivity Troponin:   ?

## 2021-08-02 NOTE — Progress Notes (Signed)
?  Echocardiogram ?2D Echocardiogram has been performed. ? ?Tammy Davies ?08/02/2021, 3:50 PM ?

## 2021-08-02 NOTE — ED Notes (Signed)
Report given to [facility] 

## 2021-08-02 NOTE — Progress Notes (Signed)
0730:  Order for IV amiodarone discontinued.  Spoke to [employer], [place] who stated to stop drip and she would discuss with Dr Lovena Le. ? ? ?0940: Dr Lovena Le at bedside, stated to resume IV amiodarone.  Drip resumed at previous rate, 33.3 ml/hr.  Dr Lovena Le states he will place new order in Epic. ? ?

## 2021-08-02 NOTE — ED Provider Notes (Signed)
[facility] EMERGENCY DEPT Provider Note   CSN: [id number] Arrival date & time: 08/02/21  0057     History  Chief Complaint  Patient presents with   Dizziness    Tammy Davies is a 61 y.o. female.  The history is provided by the patient.  Illness Location:  While at work Quality:  Dizziness and shortness of breath Onset quality:  Sudden Duration: hours. Timing:  Constant Progression:  Unchanged Chronicity:  Recurrent Context:  Patient with a h/o AFIB on Eliquis Relieved by:  Nothing Worsened by:  Nothing Ineffective treatments:  None Associated symptoms: shortness of breath   Associated symptoms: no congestion, no cough, no fever, no vomiting and no wheezing   Patient with AFIB scheduled for Ablation in May presents with SOB and dizziness.     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 Authorizing Provider  acetaminophen (TYLENOL) 500 MG tablet Take 1,500 mg by mouth every 6 (six) hours as needed for mild pain, fever or headache.    [provider]  apixaban (ELIQUIS) 5 MG TABS tablet Take 1 tablet (5 mg total) by mouth 2 (two) times daily. 08/23/20   Lyn Records, MD  dofetilide (TIKOSYN) 500 MCG capsule Take 1 capsule (500 mcg total) by mouth 2  (two) times daily. 05/29/21   Graciella Freer, PA-C  furosemide (LASIX) 20 MG tablet Take 1 tablet (20 mg total) by mouth every other day. 07/04/21 10/02/21  Tereso Newcomer T, PA-C  Magnesium Oxide 400 MG CAPS Take 1 capsule (400 mg total) by mouth daily. 06/11/21   Tereso Newcomer T, PA-C  metoprolol succinate (TOPROL XL) 100 MG 24 hr tablet Take 0.5 tablets (50 mg total) by mouth daily. Take with or immediately following a meal. 07/04/21 07/04/22  Tereso Newcomer T, PA-C  metoprolol tartrate (LOPRESSOR) 100 MG tablet Take one tablet by mouth 2 hours prior to cardiac CT 07/14/21   Lanier Prude, MD  Multiple Vitamin (MULTIVITAMIN) capsule Take 1 capsule by mouth daily. 50 plus    [provider]  potassium chloride (KLOR-CON) 10 MEQ tablet Take 1 tablet (10 mEq total) by mouth daily. 06/10/21 09/08/21  Tereso Newcomer T, PA-C  sacubitril-valsartan (ENTRESTO) 24-26 MG Take 1 tablet by mouth 2 (two) times daily. 06/17/21   Tereso Newcomer T, PA-C      Allergies    Heparin    Review of Systems   Review of Systems  Constitutional:  Negative for fever.  HENT:  Negative for congestion.   Eyes:  Negative for redness.  Respiratory:  Positive for shortness of breath. Negative for cough and wheezing.   Cardiovascular:  Positive for palpitations.  Gastrointestinal:  Negative for vomiting.  Neurological:  Positive for dizziness.  All other systems reviewed and are negative.  Physical Exam Updated Vital Signs BP 99/63   Pulse 83   Temp 98.4 F (36.9 C) (Oral)   Resp (!) 21   Wt 112.5 kg   SpO2 95%   BMI 37.71 kg/m  Physical Exam Vitals and nursing note reviewed. Exam conducted with a chaperone present.  Constitutional:      Appearance: Normal appearance. She is not diaphoretic.  HENT:     Head: Normocephalic and atraumatic.     Nose: Nose normal.  Eyes:     Conjunctiva/sclera: Conjunctivae normal.     Pupils: Pupils are equal, round, and reactive to light.  Cardiovascular:      Rate and Rhythm: Tachycardia present. Rhythm irregular.     Pulses: Normal pulses.     Heart sounds: Normal heart sounds.  Pulmonary:     Effort: Pulmonary effort is normal.     Breath sounds: Normal breath sounds.  Abdominal:     General: Bowel sounds are normal.     Palpations: Abdomen is soft.     Tenderness: There is no abdominal tenderness. There is no guarding.  Musculoskeletal:        General: Normal range of motion.     Cervical back: Normal range of motion and neck supple.  Skin:    General: Skin is warm and dry.     Capillary Refill: Capillary refill takes less than 2 seconds.  Neurological:     General: No focal deficit present.     Mental Status: She is alert and oriented to person, place, and time.     Deep Tendon Reflexes: Reflexes normal.  Psychiatric:        Mood and Affect: Mood normal.        Behavior: Behavior normal.    ED Results / Procedures / Treatments   Labs (all labs ordered are listed, but only abnormal results are displayed) Results for orders placed or performed during the hospital encounter of 08/02/21  Resp Panel by RT-PCR (Flu A&B, Covid) Nasopharyngeal Swab   Specimen: Nasopharyngeal Swab; Nasopharyngeal(NP) swabs in vial transport medium  Result Value Ref Range   SARS Coronavirus 2 by RT PCR NEGATIVE NEGATIVE   Influenza A by PCR NEGATIVE NEGATIVE   Influenza B by PCR NEGATIVE NEGATIVE  CBC with Differential/Platelet  Result Value Ref Range   WBC 8.5 4.0 - 10.5 K/uL   RBC 5.64 (H) 3.87 - 5.11 MIL/uL   Hemoglobin 16.3 (H) 12.0 - 15.0 g/dL   HCT 54.0 (H) 98.1 - 19.1 %   MCV 89.7 80.0 - 100.0 fL   MCH 28.9 26.0 - 34.0 pg   MCHC 32.2 30.0 - 36.0 g/dL   RDW 47.8 29.5 - 62.1 %   Platelets 241 150 - 400 K/uL   nRBC 0.0 0.0 - 0.2 %   Neutrophils Relative % 56 %   Neutro Abs 4.7 1.7 - 7.7 K/uL   Lymphocytes Relative 33 %   Lymphs Abs 2.8 0.7 - 4.0 K/uL   Monocytes Relative 7 %   Monocytes Absolute 0.6 0.1 - 1.0 K/uL   Eosinophils Relative  3 %   Eosinophils Absolute 0.3 0.0 - 0.5 K/uL   Basophils Relative 1 %   Basophils Absolute 0.1 0.0 - 0.1 K/uL   Immature Granulocytes 0 %   Abs Immature Granulocytes 0.02 0.00 - 0.07 K/uL  Basic metabolic panel  Result Value Ref Range   Sodium 140 135 - 145 mmol/L   Potassium 3.7  3.5 - 5.1 mmol/L   Chloride 104 98 - 111 mmol/L   CO2 27 22 - 32 mmol/L   Glucose, Bld 128 (H) 70 - 99 mg/dL   BUN 31 (H) 6 - 20 mg/dL   Creatinine, Ser 9.60 (H) 0.44 - 1.00 mg/dL   Calcium 45.4 (H) 8.9 - 10.3 mg/dL   GFR, Estimated 40 (L) >60 mL/min   Anion gap 9 5 - 15  Troponin I (High Sensitivity)  Result Value Ref Range   Troponin I (High Sensitivity) 17 <18 ng/L   EP STUDY  Result Date: 07/15/2021 Conclusions: 1.  Successful cardioversion of atrial fibrillation to sinus rhythm 2.  No early apparent complications.   DG Chest Portable 1 View  Result Date: 08/02/2021 CLINICAL DATA:  Dizziness, dyspnea EXAM: PORTABLE CHEST 1 VIEW COMPARISON:  02/22/2021 FINDINGS: The heart size and mediastinal contours are within normal limits. Both lungs are clear. The visualized skeletal structures are unremarkable. IMPRESSION: No active disease. Electronically Signed   By: Helyn Numbers M.D.   On: 08/02/2021 01:26     EKG  EKG Interpretation  Date/Time:  Saturday August 02 2021 01:04:19 EDT Ventricular Rate:  144 PR Interval:    QRS Duration: 139 QT Interval:  382 QTC Calculation: 592 R Axis:   -48 Text Interpretation: Atrial fibrillation Left bundle branch block Baseline wander in lead(s) I III aVR aVL V5 Confirmed by Nicanor Alcon, Ilo Beamon ([id number]) on 08/02/2021 2:57:07 AM        Radiology DG Chest Portable 1 View  Result Date: 08/02/2021 CLINICAL DATA:  Dizziness, dyspnea EXAM: PORTABLE CHEST 1 VIEW COMPARISON:  02/22/2021 FINDINGS: The heart size and mediastinal contours are within normal limits. Both lungs are clear. The visualized skeletal structures are unremarkable. IMPRESSION: No active disease.  Electronically Signed   By: Helyn Numbers M.D.   On: 08/02/2021 01:26    Procedures Procedures    Medications Ordered in ED Medications  diltiazem (CARDIZEM) 1 mg/mL load via infusion 10 mg (10 mg Intravenous Bolus from Bag 08/02/21 0114)    And  diltiazem (CARDIZEM) 125 mg in dextrose 5% 125 mL (1 mg/mL) infusion (0 mg/hr Intravenous Paused 08/02/21 0148)  sodium chloride 0.9 % bolus 250 mL (0 mLs Intravenous Hold 08/02/21 0149)  amiodarone (NEXTERONE) IV bolus only 150 mg/100 mL (has no administration in time range)  amiodarone (NEXTERONE PREMIX) 360-4.14 MG/200ML-% (1.8 mg/mL) IV infusion (has no administration in time range)  amiodarone (NEXTERONE PREMIX) 360-4.14 MG/200ML-% (1.8 mg/mL) IV infusion (has no administration in time range)  sodium chloride 0.9 % bolus 500 mL (0 mLs Intravenous Stopped 08/02/21 0148)    ED Course/ Medical Decision Making/ A&P                           Medical Decision Making Patient with AFIB who presents for SOB and dizziness   Problems Addressed: AKI (acute kidney injury) (HCC): acute illness or injury    Details: gingerly hydrated given low EF Atrial fibrillation, unspecified type [facility]): chronic illness or injury with exacerbation, progression, or side effects of treatment    Details: attempted diltiazem, BP could not sustain.  Amiodarone initiated  Amount and/or Complexity of Data Reviewed External Data Reviewed: notes.    Details: previous cardiology notes reviewed Labs: ordered.    Details: all labs reviewed: troponin 17, normal potassium of 3.7 BUN elevated at 31, creatinine elevated at 1.5 Radiology: ordered.    Details: No acute findings on CXR by  my reviewed ECG/medicine tests: ordered and independent interpretation performed. Decision-making details documented in ED Course. Discussion of management or test interpretation with external provider(s): 235 Case d/w Dr. Marcello Fennel of cardiology, please admit cardiology service    Risk Prescription drug management. Drug therapy requiring intensive monitoring for toxicity. Decision regarding hospitalization. Risk Details: Patient attempted on diltiazem but unable to tolerated secondary to BP,  discontinued.  Is being initiated on amiodarone, bolus and drip for AFIB.  Will admit to cardiology service due to her medical complexity.    Critical Care Total time providing critical care: 60 minutes   Final Clinical Impression(s) / ED Diagnoses Final diagnoses:  Atrial fibrillation, unspecified type [facility])   The patient appears reasonably stabilized for admission considering the current resources, flow, and capabilities available in the ED at this time, and I doubt any other [facility] requiring further screening and/or treatment in the ED prior to admission.  Rx / DC Orders ED Discharge Orders     None         Xaria Judon, MD 08/02/21 1610

## 2021-08-02 NOTE — ED Notes (Signed)
Report given to Larose Hires // questions and concerns answered  ?

## 2021-08-02 NOTE — ED Notes (Signed)
MD made aware of pt BP // stop cardizem drip per MD request  ?

## 2021-08-02 NOTE — Progress Notes (Signed)
? ?Progress Note ? ?Patient Name: Tammy Davies ?Date of Encounter: 08/02/2021 ? ?Primary Cardiologist: Sinclair Grooms, MD  ? ?Subjective  ? ?Feels better and wants to go home. Denies chest pain or sob.  ? ?Inpatient Medications  ?  ?Scheduled Meds: ? amiodarone  400 mg Oral BID  ? apixaban  5 mg Oral BID  ? magnesium oxide  400 mg Oral Daily  ? metoprolol succinate  75 mg Oral Daily  ? ?Continuous Infusions: ? sodium chloride Stopped (08/02/21 0149)  ? ?PRN Meds: ?acetaminophen, ondansetron (ZOFRAN) IV  ? ?Vital Signs  ?  ?Vitals:  ? 08/02/21 0345 08/02/21 0444 08/02/21 7124 08/02/21 0933  ?BP: 99/80 114/73 107/89 104/70  ?Pulse: (!) 41 74 86 86  ?Resp: (!) 21 18 16    ?Temp:  (!) 97.4 ?F (36.3 ?C) 97.8 ?F (36.6 ?C)   ?TempSrc:  Oral Oral   ?SpO2: 96% 91% 99%   ?Weight:  113.1 kg    ?Height:  5' 8"  (1.727 m)    ? ? ?Intake/Output Summary (Last 24 hours) at 08/02/2021 1019 ?Last data filed at 08/02/2021 5809 ?Gross per 24 hour  ?Intake 150 ml  ?Output --  ?Net 150 ml  ? ?Filed Weights  ? 08/02/21 0109 08/02/21 0444  ?Weight: 112.5 kg 113.1 kg  ? ? ?Telemetry  ?  ?Atrial fib with a controlled VR - Personally Reviewed ? ?ECG  ?  ?Atrial fib with a controlled VR - Personally Reviewed ? ?Physical Exam  ? ?GEN: No acute distress.   ?Neck: No JVD ?Cardiac: IRIRR, no murmurs, rubs, or gallops.  ?Respiratory: Clear to auscultation bilaterally. ?GI: Soft, nontender, non-distended  ?MS: No edema; No deformity. ?Neuro:  Nonfocal  ?Psych: Normal affect  ? ?Labs  ?  ?Chemistry ?Recent Labs  ?Lab 08/02/21 ?0110 08/02/21 ?9833  ?NA 140 137  ?K 3.7 3.9  ?CL 104 104  ?CO2 27 22  ?GLUCOSE 128* 115*  ?BUN 31* 28*  ?CREATININE 1.50* 1.17*  ?CALCIUM 10.7* 9.3  ?PROT  --  5.8*  ?ALBUMIN  --  3.1*  ?AST  --  15  ?ALT  --  13  ?ALKPHOS  --  93  ?BILITOT  --  0.5  ?GFRNONAA 40* 53*  ?ANIONGAP 9 11  ?  ? ?Hematology ?Recent Labs  ?Lab 08/02/21 ?0110  ?WBC 8.5  ?RBC 5.64*  ?HGB 16.3*  ?HCT 50.6*  ?MCV 89.7  ?MCH 28.9  ?MCHC 32.2  ?RDW  15.5  ?PLT 241  ? ? ?Cardiac EnzymesNo results for input(s): TROPONINI in the last 168 hours. No results for input(s): TROPIPOC in the last 168 hours.  ? ?BNP ?Recent Labs  ?Lab 08/02/21 ?8250  ?BNP 220.9*  ?  ? ?DDimer No results for input(s): DDIMER in the last 168 hours.  ? ?Radiology  ?  ?DG Chest Portable 1 View ? ?Result Date: 08/02/2021 ?CLINICAL DATA:  Dizziness, dyspnea EXAM: PORTABLE CHEST 1 VIEW COMPARISON:  02/22/2021 FINDINGS: The heart size and mediastinal contours are within normal limits. Both lungs are clear. The visualized skeletal structures are unremarkable. IMPRESSION: No active disease. Electronically Signed   By: Fidela Salisbury M.D.   On: 08/02/2021 01:26   ? ?Cardiac Studies  ? ?none ? ?Patient Profile  ?   ?61 y.o. female admitted with uncontrolled atrial fib, having failed multiple DCCV's. ? ?Assessment & Plan  ?  ?Atrial fib - her VR is controlled. Continue amiodarone today with plans for DC home tomorrow on amiodarone 400  mg bid. Her rates are now controlled and I suspect that the caffeine played a role. ?Obesity - her BMI is 38 and she will need to lose weight. ?HFREF - she appears euvolemic. Suspect that her afib is playing a role. Her entresto and torsemide have been held. Her kidney function is better. I will anticipate restarting Entresto tomorrow if renal function is stable.  ?   ? ?For questions or updates, please contact [facility] ?Please consult [email or web address] for contact info under Cardiology/STEMI. ?  ?   ?Signed, ?Cristopher Peru, MD  ?08/02/2021, 10:19 AM    ?

## 2021-08-02 NOTE — ED Triage Notes (Signed)
Presents from home via POV for dizziness and SOB. H/o afib for which she has been cardioverted previously  and is scheduled to have an ablation in May.  ?Takes blood thinner. ?Current smoker.  ?

## 2021-08-03 ENCOUNTER — Other Ambulatory Visit: Payer: Self-pay | Admitting: Cardiology

## 2021-08-03 DIAGNOSIS — I4819 Other persistent atrial fibrillation: Secondary | ICD-10-CM | POA: Diagnosis not present

## 2021-08-03 DIAGNOSIS — N179 Acute kidney failure, unspecified: Secondary | ICD-10-CM

## 2021-08-03 LAB — MAGNESIUM: Magnesium: 1.9 mg/dL (ref 1.7–2.4)

## 2021-08-03 LAB — BASIC METABOLIC PANEL
Anion gap: 7 (ref 5–15)
BUN: 26 mg/dL — ABNORMAL HIGH (ref 6–20)
CO2: 22 mmol/L (ref 22–32)
Calcium: 8.9 mg/dL (ref 8.9–10.3)
Chloride: 107 mmol/L (ref 98–111)
Creatinine, Ser: 0.99 mg/dL (ref 0.44–1.00)
GFR, Estimated: >60 mL/min (ref >60)
Glucose, Bld: 98 mg/dL (ref 70–99)
Potassium: 4.4 mmol/L (ref 3.5–5.1)
Sodium: 136 mmol/L (ref 135–145)

## 2021-08-03 MED ORDER — AMIODARONE HCL 200 MG PO TABS
400.0000 mg | ORAL_TABLET | Freq: Two times a day (BID) | ORAL | 6 refills | Status: DC
Start: 2021-08-03 — End: 2022-01-16

## 2021-08-03 MED ORDER — AMIODARONE HCL 200 MG PO TABS: 400.0000 mg | ORAL_TABLET | Freq: Two times a day (BID) | ORAL | 6 refills | Status: DC

## 2021-08-03 MED ORDER — METOPROLOL SUCCINATE ER 25 MG PO TB24: 75.0000 mg | ORAL_TABLET | Freq: Every day | ORAL | 6 refills | Status: DC

## 2021-08-03 NOTE — Progress Notes (Signed)
? ?Progress Note ? ?Patient Name: Tammy Davies ?Date of Encounter: 08/03/2021 ? ?Primary Cardiologist: Sinclair Grooms, MD  ? ?Subjective  ? ?No chest pain. Feels better back in NSR ? ?Inpatient Medications  ?  ?Scheduled Meds: ? amiodarone  400 mg Oral BID  ? apixaban  5 mg Oral BID  ? magnesium oxide  400 mg Oral Daily  ? metoprolol succinate  75 mg Oral Daily  ? ?Continuous Infusions: ? sodium chloride Stopped (08/02/21 0149)  ? ?PRN Meds: ?acetaminophen, ondansetron (ZOFRAN) IV  ? ?Vital Signs  ?  ?Vitals:  ? 08/03/21 0023 08/03/21 2876 08/03/21 0750 08/03/21 0910  ?BP: (!) 117/58 117/68 126/66   ?Pulse: 64 79 (!) 56 60  ?Resp: 18 18 16    ?Temp: (!) 97.4 ?F (36.3 ?C) 97.9 ?F (36.6 ?C) 97.8 ?F (36.6 ?C)   ?TempSrc: Oral Oral Oral   ?SpO2:  94% 95%   ?Weight:      ?Height:      ? ? ?Intake/Output Summary (Last 24 hours) at 08/03/2021 1209 ?Last data filed at 08/03/2021 0753 ?Gross per 24 hour  ?Intake 390 ml  ?Output --  ?Net 390 ml  ? ?Filed Weights  ? 08/02/21 0109 08/02/21 0444  ?Weight: 112.5 kg 113.1 kg  ? ? ?Telemetry  ?  ?Atrial fib to NSR - Personally Reviewed ? ?ECG  ?  ?none - Personally Reviewed ? ?Physical Exam  ? ?GEN: No acute distress.   ?Neck: No JVD ?Cardiac: RRR, no murmurs, rubs, or gallops.  ?Respiratory: Clear to auscultation bilaterally. ?GI: Soft, nontender, non-distended  ?MS: No edema; No deformity. ?Neuro:  Nonfocal  ?Psych: Normal affect  ? ?Labs  ?  ?Chemistry ?Recent Labs  ?Lab 08/02/21 ?0110 08/02/21 ?8115 08/03/21 ?7262  ?NA 140 137 136  ?K 3.7 3.9 4.4  ?CL 104 104 107  ?CO2 27 22 22   ?GLUCOSE 128* 115* 98  ?BUN 31* 28* 26*  ?CREATININE 1.50* 1.17* 0.99  ?CALCIUM 10.7* 9.3 8.9  ?PROT  --  5.8*  --   ?ALBUMIN  --  3.1*  --   ?AST  --  15  --   ?ALT  --  13  --   ?ALKPHOS  --  93  --   ?BILITOT  --  0.5  --   ?GFRNONAA 40* 53* >60  ?ANIONGAP 9 11 7   ?  ? ?Hematology ?Recent Labs  ?Lab 08/02/21 ?0110  ?WBC 8.5  ?RBC 5.64*  ?HGB 16.3*  ?HCT 50.6*  ?MCV 89.7  ?MCH 28.9  ?MCHC 32.2   ?RDW 15.5  ?PLT 241  ? ? ?Cardiac EnzymesNo results for input(s): TROPONINI in the last 168 hours. No results for input(s): TROPIPOC in the last 168 hours.  ? ?BNP ?Recent Labs  ?Lab 08/02/21 ?0355  ?BNP 220.9*  ?  ? ?DDimer No results for input(s): DDIMER in the last 168 hours.  ? ?Radiology  ?  ?DG Chest Portable 1 View ? ?Result Date: 08/02/2021 ?CLINICAL DATA:  Dizziness, dyspnea EXAM: PORTABLE CHEST 1 VIEW COMPARISON:  02/22/2021 FINDINGS: The heart size and mediastinal contours are within normal limits. Both lungs are clear. The visualized skeletal structures are unremarkable. IMPRESSION: No active disease. Electronically Signed   By: Fidela Salisbury M.D.   On: 08/02/2021 01:26  ? ?ECHOCARDIOGRAM COMPLETE ? ?Result Date: 08/02/2021 ?   ECHOCARDIOGRAM REPORT   Patient Name:   Tammy Davies Date of Exam: 08/02/2021 Medical Rec #:  [id number]  Height:       68.0 in Accession #:    [id number]         Weight:       249.4 lb Date of Birth:  03-19-1961         BSA:          2.245 m? Patient Age:    61 years           BP:           121/87 mmHg Patient Gender: F                  HR:           68 bpm. Exam Location:  Inpatient Procedure: 2D Echo, Cardiac Doppler, Limited Color Doppler and Intracardiac            Opacification Agent Indications:    Atrial fibrillation  History:        Patient has prior history of Echocardiogram examinations, most                 recent 04/07/2021. Arrythmias:Atrial Fibrillation; Risk                 Factors:Current Smoker.  Sonographer:    Clayton Lefort RDCS (AE) Referring Phys: [id number] [place] TANNENBAUM  Sonographer Comments: Technically difficult study due to poor echo windows, suboptimal parasternal window, suboptimal apical window, suboptimal subcostal window and patient is morbidly obese. Image acquisition challenging due to patient body habitus. IMPRESSIONS  1. Cannot fully assess wall motion. Left ventricular ejection fraction, by estimation, is 40 to 45%. The left ventricle  has mildly decreased function. Left ventricular diastolic parameters are indeterminate.  2. Right ventricular systolic function is normal. The right ventricular size is normal. Tricuspid regurgitation signal is inadequate for assessing PA pressure.  3. No evidence of mitral valve regurgitation.  4. Aortic valve regurgitation is not visualized.  5. The inferior vena cava not well visualized. FINDINGS  Left Ventricle: Cannot fully assess wall motion. Left ventricular ejection fraction, by estimation, is 40 to 45%. The left ventricle has mildly decreased function. Definity contrast agent was given IV to delineate the left ventricular endocardial borders. The left ventricular internal cavity size was normal in size. Left ventricular diastolic parameters are indeterminate. Right Ventricle: The right ventricular size is normal. Right ventricular systolic function is normal. Tricuspid regurgitation signal is inadequate for assessing PA pressure. Left Atrium: Left atrial size was normal in size. Right Atrium: Right atrial size was normal in size. Pericardium: There is no evidence of pericardial effusion. Mitral Valve: No evidence of mitral valve regurgitation. Tricuspid Valve: Tricuspid valve regurgitation is not demonstrated. Aortic Valve: Aortic valve regurgitation is not visualized. Aortic valve mean gradient measures 6.0 mmHg. Aortic valve peak gradient measures 10.9 mmHg. Aortic valve area, by VTI measures 2.74 cm?. Pulmonic Valve: The pulmonic valve was not well visualized. Aorta: The aortic root is normal in size and structure. Venous: The inferior vena cava not well visualized.  LEFT VENTRICLE PLAX 2D LVIDd:         5.30 cm LVIDs:         4.20 cm LV PW:         2.10 cm LV IVS:        2.10 cm LVOT diam:     2.10 cm LV SV:         87 LV SV Index:   39 LVOT Area:     3.46 cm?  RIGHT VENTRICLE RV  Basal diam:  4.20 cm RV Mid diam:    3.00 cm RV S prime:     9.14 cm/s TAPSE (M-mode): 2.1 cm LEFT ATRIUM             Index         RIGHT ATRIUM           Index LA diam:        3.70 cm 1.65 cm/m?   RA Area:     19.50 cm? LA Vol (A2C):   75.2 ml 33.50 ml/m?  RA Volume:   56.10 ml  24.99 ml/m? LA Vol (A4C):   37.1 ml 16.53 ml/m? LA Biplane Vol: 53.4 ml 23.79 ml/m?  AORTIC VALVE AV Area (Vmax):    3.25 cm? AV Area (Vmean):   3.16 cm? AV Area (VTI):     2.74 cm? AV Vmax:           165.00 cm/s AV Vmean:          113.000 cm/s AV VTI:            0.318 m AV Peak Grad:      10.9 mmHg AV Mean Grad:      6.0 mmHg LVOT Vmax:         155.00 cm/s LVOT Vmean:        103.000 cm/s LVOT VTI:          0.252 m LVOT/AV VTI ratio: 0.79  AORTA Ao Root diam: 3.60 cm  SHUNTS Systemic VTI:  0.25 m Systemic Diam: 2.10 cm Phineas Inches Electronically signed by Phineas Inches Signature Date/Time: 08/02/2021/6:27:19 PM    Final    ? ?Cardiac Studies  ? ?See above ? ?Patient Profile  ?   ?61 y.o. female admitted with symptomatic atrial fib with RVR ? ?Assessment & Plan  ?  ?Atrial fib with a RVR - she has reverted back to NSR. She will continue amiodarone 400 bid for a week and then 400 mg daily. She is scheduled for PVI in May. ?Obesity - I encouraged the patient to lose weight. ?Coags - she will continue eliquis. ?[facility] will sign off.   ?Medication Recommendations:  see above for amio ?Other recommendations (labs, testing, etc):  none ?Follow up as an outpatient:  3-4 weeks in either AFib clinic or with EP PA/Dr. CL ? ?For questions or updates, please contact [place] ?Please consult [email or web address] for contact info under Cardiology/STEMI. ?  ?   ?Signed, ?Cristopher Peru, MD  ?08/03/2021, 12:09 PM    ?

## 2021-08-03 NOTE — Discharge Instructions (Addendum)
Stop the Tikosyn  ? ?We placed you on amiodarone 400 mg ( 2 of 200 mg) twice a day.  For 1 week then beginning 08/12/19 take 2 tabs every morning only.    ? ?We decreased your dose of lopressor  ? ?Heart Healthy Diet  ? ?Call if any questions or problems. ? ?Do not stop Eliquis  ? ?You will need lab work on Thursday at Dr. Mardene Speak office.  To check kidney function.  ? ?Decrease your caffeine intake.   ?

## 2021-08-03 NOTE — Plan of Care (Signed)

## 2021-08-03 NOTE — Discharge Summary (Addendum)
?Discharge Summary  ?  ?Patient ID: Tammy Davies ?MRN: [id number]; DOB: [birth year] ? ?Admit date: 08/02/2021 ?Discharge date: 08/03/2021 ? ?[facility]P:  [facility] ?  ?[facility] Providers ?Cardiologist:  Sinclair Grooms, MD  ?Cardiology APP:  Liliane Shi, PA-C  ?Electrophysiologist:  Vickie Epley, MD     ? ? ?Discharge Diagnoses  ?  ?Principal Problem: ?  A-fib ([place]) ?Active Problems: ?  Tobacco abuse ?  Obesity (BMI 30-39.9) ?  Chronic anticoagulation ?  Atrial fibrillation ([place]) ? ? ? ?Diagnostic Studies/Procedures  ?  ?Echo 08/02/21 ?IMPRESSIONS  ? ? ? 1. Cannot fully assess wall motion. Left ventricular ejection fraction,  ?by estimation, is 40 to 45%. The left ventricle has mildly decreased  ?function. Left ventricular diastolic parameters are indeterminate.  ? 2. Right ventricular systolic function is normal. The right ventricular  ?size is normal. Tricuspid regurgitation signal is inadequate for assessing  ?PA pressure.  ? 3. No evidence of mitral valve regurgitation.  ? 4. Aortic valve regurgitation is not visualized.  ? 5. The inferior vena cava not well visualized.  ? ?FINDINGS  ? Left Ventricle: Cannot fully assess wall motion. Left ventricular  ?ejection fraction, by estimation, is 40 to 45%. The left ventricle has  ?mildly decreased function. Definity contrast agent was given IV to  ?delineate the left ventricular endocardial  ?borders. The left ventricular internal cavity size was normal in size.  ?Left ventricular diastolic parameters are indeterminate.  ? ?Right Ventricle: The right ventricular size is normal. Right ventricular  ?systolic function is normal. Tricuspid regurgitation signal is inadequate  ?for assessing PA pressure.  ? ?Left Atrium: Left atrial size was normal in size.  ? ?Right Atrium: Right atrial size was normal in size.  ? ?Pericardium: There is no evidence of pericardial effusion.  ? ?Mitral Valve: No evidence of mitral valve regurgitation.  ? ?Tricuspid  Valve: Tricuspid valve regurgitation is not demonstrated.  ? ?Aortic Valve: Aortic valve regurgitation is not visualized. Aortic valve  ?mean gradient measures 6.0 mmHg. Aortic valve peak gradient measures 10.9  ?mmHg. Aortic valve area, by VTI measures 2.74 cm?.  ? ?Pulmonic Valve: The pulmonic valve was not well visualized.  ? ?Aorta: The aortic root is normal in size and structure.  ? ?Venous: The inferior vena cava not well visualized.  ?_____________ ?  ?History of Present Illness   ?  ?Tammy Davies is a 61 y.o. female with persistent atrial fibrillation CHADSVASC of 3 despite tikosyn and three cardioversions this year, on eliquis and metoprolol, HFrEF, HTN  that presented to ER 08/02/21 after having dizziness/lightheadedness on Friday, she did have increased caffeine that day with coffee and pepsi.   Normally she is not aware of atrial fib.  She was started in ER on Dilt.  But with this hypotension so dilt was stopped.  She was then placed on amiodarone IV with bolus and drip.    She is scheduled for PVI/CTI and watchman implantation in May with Dr. Quentin Ore.   She was loaded with Tikosyn in early Jan.  Not on antiarrhythmics before.  Previously was on metoprolol 100 mg but was decreed to 50 mg due to lightheadedness.       ? ?EKG demonstrated  demonstrates atrial fibrillation with RVR. ?  ?Na 136 K+ 4.4, Cr 0.99 Mg+ 1.9  ?BNP 220 ?Hs troponin 17-15  ?Hgb 16.3 WBC 8.5 ?TSH 5.714  ?CXR NAD ?She was admitted for further eval.  ? ?Hospital Course  ?   ?  Consultants: Dr. Lovena Le EP ? ?Pt's VR rate was controlled and amiodarone continued. Today seen and examined by Dr. Lovena Le and pt back in [place].  Plan for amiodarone 400 mg BID for week then 400 mg daily.     She will continue with PVI in May. ? ?She is on eliquis for anticoagulation will continue ? ?Her AKI is resolved will resume her entresto and furosemide and close follow up.  Will recheck labs in a week.   ? ?HFREF and is euvolemic.   ? ?Office will schedule  follow up.   ? ?Did the patient have an acute coronary syndrome (MI, NSTEMI, STEMI, etc) this admission?:  No                               ?Did the patient have a percutaneous coronary intervention (stent / angioplasty)?:  No.   ? ?   ? ?  ?_____________ ? ?Discharge Vitals ?Blood pressure 121/64, pulse (!) 57, temperature 98 ?F (36.7 ?C), temperature source Oral, resp. rate 18, height 5' 8"  (1.727 m), weight 113.1 kg, SpO2 96 %.  ?Filed Weights  ? 08/02/21 0109 08/02/21 0444  ?Weight: 112.5 kg 113.1 kg  ? ? ?Labs & Radiologic Studies  ?  ?CBC ?Recent Labs  ?  08/02/21 ?0110  ?WBC 8.5  ?NEUTROABS 4.7  ?HGB 16.3*  ?HCT 50.6*  ?MCV 89.7  ?PLT 241  ? ?Basic Metabolic Panel ?Recent Labs  ?  08/02/21 ?9417 08/03/21 ?4081  ?NA 137 136  ?K 3.9 4.4  ?CL 104 107  ?CO2 22 22  ?GLUCOSE 115* 98  ?BUN 28* 26*  ?CREATININE 1.17* 0.99  ?CALCIUM 9.3 8.9  ?MG 1.8 1.9  ? ?Liver Function Tests ?Recent Labs  ?  08/02/21 ?4481  ?AST 15  ?ALT 13  ?ALKPHOS 93  ?BILITOT 0.5  ?PROT 5.8*  ?ALBUMIN 3.1*  ? ?No results for input(s): LIPASE, AMYLASE in the last 72 hours. ?High Sensitivity Troponin:   ?Recent Labs  ?Lab 08/02/21 ?0110 08/02/21 ?8563  ?TROPONINIHS 17 15  ?  ?BNP ?Invalid input(s): POCBNP ?D-Dimer ?No results for input(s): DDIMER in the last 72 hours. ?Hemoglobin A1C ?No results for input(s): HGBA1C in the last 72 hours. ?Fasting Lipid Panel ?No results for input(s): CHOL, HDL, LDLCALC, TRIG, CHOLHDL, LDLDIRECT in the last 72 hours. ?Thyroid Function Tests ?Recent Labs  ?  08/02/21 ?1497  ?TSH 5.714*  ? ?_____________  ?EP STUDY ? ?Result Date: 07/15/2021 ?Conclusions: 1.  Successful cardioversion of atrial fibrillation to sinus rhythm 2.  No early apparent complications.  ? ?DG Chest Portable 1 View ? ?Result Date: 08/02/2021 ?CLINICAL DATA:  Dizziness, dyspnea EXAM: PORTABLE CHEST 1 VIEW COMPARISON:  02/22/2021 FINDINGS: The heart size and mediastinal contours are within normal limits. Both lungs are clear. The visualized  skeletal structures are unremarkable. IMPRESSION: No active disease. Electronically Signed   By: Fidela Salisbury M.D.   On: 08/02/2021 01:26  ? ?ECHOCARDIOGRAM COMPLETE ? ?Result Date: 08/02/2021 ?   ECHOCARDIOGRAM REPORT   Patient Name:   Tammy Davies Date of Exam: 08/02/2021 Medical Rec #:  [id number]          Height:       68.0 in Accession #:    [id number]         Weight:       249.4 lb Date of Birth:  [birth year]         BSA:  2.245 m? Patient Age:    15 years           BP:           121/87 mmHg Patient Gender: F                  HR:           68 bpm. Exam Location:  Inpatient Procedure: 2D Echo, Cardiac Doppler, Limited Color Doppler and Intracardiac            Opacification Agent Indications:    Atrial fibrillation  History:        Patient has prior history of Echocardiogram examinations, most                 recent 04/07/2021. Arrythmias:Atrial Fibrillation; Risk                 Factors:Current Smoker.  Sonographer:    Clayton Lefort RDCS (AE) Referring Phys: [id number] [place]  Sonographer Comments: Technically difficult study due to poor echo windows, suboptimal parasternal window, suboptimal apical window, suboptimal subcostal window and patient is morbidly obese. Image acquisition challenging due to patient body habitus. IMPRESSIONS  1. Cannot fully assess wall motion. Left ventricular ejection fraction, by estimation, is 40 to 45%. The left ventricle has mildly decreased function. Left ventricular diastolic parameters are indeterminate.  2. Right ventricular systolic function is normal. The right ventricular size is normal. Tricuspid regurgitation signal is inadequate for assessing PA pressure.  3. No evidence of mitral valve regurgitation.  4. Aortic valve regurgitation is not visualized.  5. The inferior vena cava not well visualized. FINDINGS  Left Ventricle: Cannot fully assess wall motion. Left ventricular ejection fraction, by estimation, is 40 to 45%. The left ventricle has mildly  decreased function. Definity contrast agent was given IV to delineate the left ventricular endocardial borders. The left ventricular internal cavity size was normal in size. Left ventricular diastolic parameters

## 2021-08-03 NOTE — TOC Transition Note (Signed)
Transition of Care (TOC) - CM/SW Discharge Note ? ? ?Patient Details  ?Name: Tammy Davies ?MRN: [id number] ?Date of Birth: Apr 24, 1961 ? ?Transition of Care (TOC) CM/SW Contact:  ?Pollie Friar, RN ?Phone Number: ?08/03/2021, 1:40 PM ? ? ?Clinical Narrative:    ?CM consulted for Tikosyn but it has been d/ced from her med list. Pt is from home and doesn't have any other TOC needs at this time.  ? ? ?Final next level of care: Home/Self Care ?Barriers to Discharge: No Barriers Identified ? ? ?Patient Goals and CMS Choice ?  ?  ?  ? ?Discharge Placement ?  ?           ?  ?  ?  ?  ? ?Discharge Plan and Services ?  ?  ?           ?  ?  ?  ?  ?  ?  ?  ?  ?  ?  ? ?Social Determinants of Health (SDOH) Interventions ?  ? ? ?Readmission Risk Interventions ?No flowsheet data found. ? ? ? ? ?

## 2021-08-05 ENCOUNTER — Other Ambulatory Visit: Payer: Self-pay

## 2021-08-05 ENCOUNTER — Encounter (HOSPITAL_COMMUNITY): Payer: Self-pay | Admitting: Physician Assistant

## 2021-08-05 ENCOUNTER — Ambulatory Visit (HOSPITAL_COMMUNITY)
Admission: RE | Admit: 2021-08-05 | Discharge: 2021-08-05 | Disposition: A | Discharge status: Home / Self Care | Payer: No Typology Code available for payment source | Source: Ambulatory Visit | Attending: Physician Assistant | Admitting: Physician Assistant

## 2021-08-05 VITALS — BP 114/78 | HR 52 | Ht 68.0 in | Wt 252.0 lb

## 2021-08-05 DIAGNOSIS — F17210 Nicotine dependence, cigarettes, uncomplicated: Secondary | ICD-10-CM | POA: Insufficient documentation

## 2021-08-05 DIAGNOSIS — G4733 Obstructive sleep apnea (adult) (pediatric): Secondary | ICD-10-CM | POA: Diagnosis not present

## 2021-08-05 DIAGNOSIS — I4819 Other persistent atrial fibrillation: Secondary | ICD-10-CM | POA: Insufficient documentation

## 2021-08-05 DIAGNOSIS — Z6838 Body mass index (BMI) 38.0-38.9, adult: Secondary | ICD-10-CM | POA: Insufficient documentation

## 2021-08-05 DIAGNOSIS — Z7901 Long term (current) use of anticoagulants: Secondary | ICD-10-CM | POA: Diagnosis not present

## 2021-08-05 DIAGNOSIS — I110 Hypertensive heart disease with heart failure: Secondary | ICD-10-CM | POA: Insufficient documentation

## 2021-08-05 DIAGNOSIS — E669 Obesity, unspecified: Secondary | ICD-10-CM | POA: Diagnosis not present

## 2021-08-05 DIAGNOSIS — D6869 Other thrombophilia: Secondary | ICD-10-CM

## 2021-08-05 DIAGNOSIS — I5022 Chronic systolic (congestive) heart failure: Secondary | ICD-10-CM | POA: Diagnosis not present

## 2021-08-05 DIAGNOSIS — I4892 Unspecified atrial flutter: Secondary | ICD-10-CM | POA: Diagnosis not present

## 2021-08-05 LAB — BASIC METABOLIC PANEL
Anion gap: 10 (ref 5–15)
BUN: 20 mg/dL (ref 6–20)
CO2: 23 mmol/L (ref 22–32)
Calcium: 9 mg/dL (ref 8.9–10.3)
Chloride: 105 mmol/L (ref 98–111)
Creatinine, Ser: 1.03 mg/dL — ABNORMAL HIGH (ref 0.44–1.00)
GFR, Estimated: >60 mL/min (ref >60)
Glucose, Bld: 103 mg/dL — ABNORMAL HIGH (ref 70–99)
Potassium: 4.3 mmol/L (ref 3.5–5.1)
Sodium: 138 mmol/L (ref 135–145)

## 2021-08-05 NOTE — Patient Instructions (Signed)
On March 27th - decrease amiodarone to 220m twice a day ?

## 2021-08-05 NOTE — Progress Notes (Signed)
? ?Primary Care Physician: [facility] ?Referring Physician: Robbie Lis ?Cardiologist: Dr. Tamala Julian  ?Primary EP: Dr Quentin Ore ? ? ?Tammy Davies is a 61 y.o. 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  She was found to have persistent afib/flutter and was on Cardizem IV, transitioned to oral prior to d/c. She was on heparin for a while perioperatively but transitioned back to eliquis prior to d/c. She was also positive for Covid on admission but asymptomatic and has had all vaccines.  ? ?She was seen by Robbie Lis, PA, and still had RVR in the 120's and he added low dose BB to her daily Cardizem. However, pt misunderstood and stopped her CCB, still in atrial flutter(typical) today at 127 bpm. She states that she does not feel her heart rhythm and is tolerating this well. She expects to have a reverse colostomy in 6 months.  ? ?F/u in the afib clinic, 07/26/20. At time of cardioversion , she went from atrail flutter with 3-4 AVB, to afib with RVR at 160 bpm. She eas cardioverted 2 additional times but did not convert. She was given esmolol per anesthesiology and  was d/c in afib in the 90's. The ekg shows today that she remains in afib at 29 bpm.she states that the afib is not bothering her and she feels well. She is pending having her colonoscopy reversed in June. She continues on BB/CCB for heart rate control.  ? ?F/u in afib clinic, 03/04/21. Initially, she thought that she may have  colostomy reversed in June, however this did not occur. She was admitted in June with abdominal pain and terminal ileitis. She asked for this appointment as she has been feeling more short of breath. In SR with controlled v rate today. She mentions now that reversal may not be until January if at all, as the surgeon told her she has a lot of scar  tissue. She would like to try to restore SR. She has not noted any fluid retention. Her weight is up 12 lbs. She feels this represents true weight gain. She has cut back smoking to one pack a week. When she was in the hospital in June, she was told that she needed a sleep study as she had snoring and  desaturation in her O2 with sleep.  ? ?04/16/21, Pt is here to f/u up on echo that showed moderately reduced EF at 30-35%. I  discussed with Dr. Tamala Julian and he thought probably [facility] and would try to restore sinus rhythm to improve EF, medication adjustment to address EFdysfunction and after restoring SR, he will consider further if ischemic w/u is needed. Weight is up a few lbs, but she states not fluid. I cannot appreciate any fluid and pt states that her diet is terrible. She has cut back but continues to smoke. She actually states that her breathing is improved as compared when she asked to be seen in October. She is agreeable to coming into hospital for Tikosyn but wants to wait until early January.  ? ?Follow up in the AF clinic 05/26/21. Patient presents for dofetilide admission. She denies any missed doses of anticoagulation in the last 3 weeks.  ? ?Follow up in the AF clinic 06/05/21. Patient is s/p dofetilide loading 1/9-1/12/23 with DCCV on 05/28/21. She reports that she felt "great" for 3 days after  leaving the hospital. Unfortunately, she started feeling dyspnea on exertion on the fourth day and ECG shows afib today. She is very disappointed she is back in afib. There were no specific triggers that she could identify.  ? ?Follow up in the AF clinic 08/05/21. Patient is s/p DCCV x 2 on 06/13/21 and 07/15/21. She was admitted 08/02/21 with recorrence of her rapid afib. Dofetilide was discontinued and she was loaded on amiodarone which converted her to SR. She is scheduled for an afib and flutter ablation with Dr Quentin Ore on 09/29/21. She reports that she feels well today, no CP or increased SOB. She is on CPAP now.   ? ?Today, she denies symptoms of palpitations, chest pain, orthopnea, PND, lower extremity edema, dizziness, presyncope, syncope, or neurologic sequela. The patient is tolerating medications without difficulties and is otherwise without complaint today.  ? ?Past Medical History:  ?Diagnosis Date  ? Diverticulitis   ? Admx in 1/22 >> s/p Hartmann colectomy w colostomy (c/b AF w RVR)  ? HFrEF (heart failure with reduced ejection fraction) ([place]) 06/09/2021  ? 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 History of COVID-19   ? Hypertension 06/08/2017  ? OSA (obstructive sleep apnea)   ? Persistent atrial fibrillation ([place])   ? Persistent since admx w diverticulitis in 1/22  //  failed DCCV in 3/22  //  Dofetilide load in 1/23 >> failed DCCV x 2  ? Tobacco abuse   ? ?Past Surgical History:  ?Procedure Laterality Date  ? ABDOMINAL HYSTERECTOMY    ? CARDIOVERSION N/A 07/22/2020  ? Procedure: CARDIOVERSION;  Surgeon: [name], MD;  Location: [place];  Service: Cardiovascular;  Laterality: N/A;  ? CARDIOVERSION N/A 05/28/2021  ? Procedure: CARDIOVERSION;  Surgeon: Geralynn Rile, MD;  Location: [place];  Service: Cardiovascular;  Laterality: N/A;  ? CARDIOVERSION N/A 06/13/2021  ? Procedure: CARDIOVERSION;  Surgeon: Berniece Salines, DO;  Location: [place];  Service: Cardiovascular;  Laterality: N/A;  ? CARDIOVERSION N/A 07/15/2021  ? Procedure: CARDIOVERSION;  Surgeon: Vickie Epley, MD;  Location: [facility];  Service: Cardiovascular;  Laterality: N/A;  ? CHOLECYSTECTOMY N/A 08/18/2016  ? Procedure: LAPAROSCOPIC CHOLECYSTECTOMY WITH  INTRAOPERATIVE CHOLANGIOGRAM;  Surgeon: Johnathan Hausen, MD;  Location: [facility];  Service: General;  Laterality: N/A;  ? COLECTOMY WITH COLOSTOMY CREATION/HARTMANN PROCEDURE N/A 06/18/2020  ? Procedure: SIGMOID  COLECTOMY WITH COLOSTOMY CREATION; DRAINAGE OF PELVIC ABSCESS;  Surgeon: Rolm Bookbinder, MD;  Location: [place];  Service: General;  Laterality: N/A;  ? CYSTOSCOPY WITH STENT PLACEMENT Bilateral 06/18/2020  ? Procedure: CYSTOSCOPY WITH STENT PLACEMENT;  Surgeon: Alexis Frock, MD;  Location: [place];  Service: Urology;  Laterality: Bilateral;  ? LAPAROTOMY N/A 06/18/2020  ? Procedure: EXPLORATORY LAPAROTOMY;  Surgeon: Rolm Bookbinder, MD;  Location: [place];  Service: General;  Laterality: N/A;  ? ? ?Current Outpatient Medications  ?Medication Sig Dispense Refill  ? acetaminophen (TYLENOL) 500 MG tablet Take 1,500 mg by mouth every 6 (six) hours as needed for mild pain, fever or headache.    ? amiodarone (PACERONE) 200 MG tablet Take 2 tablets (400 mg total) by mouth 2 (two) times daily. Take 2 tabs twice a day (400 mg total twice per day)  for 1 week Decrease to 2 tabs daily on 08/11/21 80 tablet 6  ? apixaban (ELIQUIS) 5 MG TABS tablet Take 1 tablet (5 mg  total) by mouth 2 (two) times daily. 180 tablet 1  ? furosemide (LASIX) 20 MG tablet Take 1 tablet (20 mg total) by mouth every other day. (Patient taking differently: Take 20 mg by mouth daily.) 15 tablet 2  ? Magnesium Oxide 400 MG CAPS Take 1 capsule (400 mg total) by mouth daily.  0  ? metoprolol succinate (TOPROL XL) 25 MG 24 hr tablet Take 3 tablets (75 mg total) by mouth daily. Take with or immediately following a meal. 90 tablet 6  ? Multiple Vitamin (MULTIVITAMIN) capsule Take 1 capsule by mouth daily.    ? potassium chloride (KLOR-CON) 10 MEQ tablet Take 1 tablet (10 mEq total) by mouth daily. 30 tablet 2  ? sacubitril-valsartan (ENTRESTO) 24-26 MG Take 1 tablet by mouth 2 (two) times daily. 60 tablet 3  ? ?No current facility-administered medications for this encounter.  ? ? ?Allergies  ?Allergen Reactions  ? Heparin Other (See Comments)  ?  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 ? ?Social History  ? ?Socioeconomic  History  ? Marital status: Single  ?  Spouse name: Not on file  ? Number of children: Not on file  ? Years of education: Not on file  ? Highest education level: Not on file  ?Occupational History  ? Not on file  ?Tobacco

## 2021-08-07 ENCOUNTER — Other Ambulatory Visit: Payer: No Typology Code available for payment source

## 2021-08-07 ENCOUNTER — Other Ambulatory Visit: Payer: Self-pay

## 2021-08-07 DIAGNOSIS — N179 Acute kidney failure, unspecified: Secondary | ICD-10-CM

## 2021-08-07 LAB — BASIC METABOLIC PANEL
BUN/Creatinine Ratio: 18 (ref 12–28)
BUN: 17 mg/dL (ref 8–27)
CO2: 26 mmol/L (ref 20–29)
Calcium: 9.7 mg/dL (ref 8.7–10.3)
Chloride: 105 mmol/L (ref 96–106)
Creatinine, Ser: 0.92 mg/dL (ref 0.57–1.00)
Glucose: 92 mg/dL (ref 70–99)
Potassium: 4.4 mmol/L (ref 3.5–5.2)
Sodium: 141 mmol/L (ref 134–144)
eGFR: 71 mL/min/{1.73_m2} (ref >59)

## 2021-08-12 ENCOUNTER — Other Ambulatory Visit (HOSPITAL_COMMUNITY): Payer: No Typology Code available for payment source

## 2021-09-10 ENCOUNTER — Other Ambulatory Visit: Payer: No Typology Code available for payment source | Admitting: *Deleted

## 2021-09-10 ENCOUNTER — Ambulatory Visit (HOSPITAL_COMMUNITY): Payer: No Typology Code available for payment source | Attending: Cardiology

## 2021-09-10 ENCOUNTER — Ambulatory Visit: Payer: No Typology Code available for payment source | Admitting: Interventional Cardiology

## 2021-09-10 DIAGNOSIS — I4819 Other persistent atrial fibrillation: Secondary | ICD-10-CM | POA: Insufficient documentation

## 2021-09-10 DIAGNOSIS — I5020 Unspecified systolic (congestive) heart failure: Secondary | ICD-10-CM | POA: Insufficient documentation

## 2021-09-10 DIAGNOSIS — I10 Essential (primary) hypertension: Secondary | ICD-10-CM

## 2021-09-10 DIAGNOSIS — I4891 Unspecified atrial fibrillation: Secondary | ICD-10-CM

## 2021-09-10 LAB — ECHOCARDIOGRAM COMPLETE
Area-P 1/2: 1.95 cm2
S' Lateral: 4.7 cm

## 2021-09-10 MED ORDER — PERFLUTREN LIPID MICROSPHERE
3.0000 mL | INTRAVENOUS | Status: AC | PRN
  Administered 2021-09-10: 3 mL via INTRAVENOUS

## 2021-09-12 ENCOUNTER — Other Ambulatory Visit: Payer: Self-pay | Admitting: Physician Assistant

## 2021-09-15 LAB — CBC WITH DIFFERENTIAL/PLATELET
Basophils Absolute: 0.1 10*3/uL (ref 0.0–0.2)
Basos: 1 %
EOS (ABSOLUTE): 0.2 10*3/uL (ref 0.0–0.4)
Eos: 3 %
Hematocrit: 45.5 % (ref 34.0–46.6)
Hemoglobin: 15 g/dL (ref 11.1–15.9)
Immature Grans (Abs): 0 10*3/uL (ref 0.0–0.1)
Immature Granulocytes: 1 %
Lymphocytes Absolute: 2.1 10*3/uL (ref 0.7–3.1)
Lymphs: 29 %
MCH: 29.4 pg (ref 26.6–33.0)
MCHC: 33 g/dL (ref 31.5–35.7)
MCV: 89 fL (ref 79–97)
Monocytes Absolute: 0.5 10*3/uL (ref 0.1–0.9)
Monocytes: 7 %
Neutrophils Absolute: 4.4 10*3/uL (ref 1.4–7.0)
Neutrophils: 59 %
Platelets: 227 10*3/uL (ref 150–450)
RBC: 5.11 x10E6/uL (ref 3.77–5.28)
RDW: 15 % (ref 11.7–15.4)
WBC: 7.3 10*3/uL (ref 3.4–10.8)

## 2021-09-15 LAB — BASIC METABOLIC PANEL
BUN/Creatinine Ratio: 18 (ref 12–28)
BUN: 17 mg/dL (ref 8–27)
CO2: 22 mmol/L (ref 20–29)
Calcium: 9.8 mg/dL (ref 8.7–10.3)
Creatinine, Ser: 0.93 mg/dL (ref 0.57–1.00)
Glucose: 91 mg/dL (ref 70–99)
eGFR: 70 mL/min/{1.73_m2} (ref >59)

## 2021-09-22 ENCOUNTER — Telehealth (HOSPITAL_COMMUNITY): Payer: Self-pay | Admitting: Emergency Medicine

## 2021-09-22 ENCOUNTER — Encounter (HOSPITAL_COMMUNITY): Payer: Self-pay

## 2021-09-22 NOTE — Telephone Encounter (Signed)
Attempted to call patient regarding upcoming cardiac CT appointment. °Left message on voicemail with name and callback number °Tag Wurtz RN Navigator Cardiac Imaging °[facility] °[phone] Office °[phone] Cell ° °

## 2021-09-23 ENCOUNTER — Ambulatory Visit (HOSPITAL_COMMUNITY)
Admission: RE | Admit: 2021-09-23 | Discharge: 2021-09-23 | Disposition: A | Discharge status: Home / Self Care | Payer: No Typology Code available for payment source | Source: Ambulatory Visit | Attending: Cardiology | Admitting: Cardiology

## 2021-09-23 DIAGNOSIS — I4891 Unspecified atrial fibrillation: Secondary | ICD-10-CM | POA: Insufficient documentation

## 2021-09-23 MED ORDER — IOHEXOL 350 MG/ML SOLN
100.0000 mL | Freq: Once | INTRAVENOUS | Status: AC | PRN
  Administered 2021-09-23: 100 mL via INTRAVENOUS

## 2021-09-24 ENCOUNTER — Telehealth: Payer: Self-pay | Admitting: Cardiology

## 2021-09-24 DIAGNOSIS — R931 Abnormal findings on diagnostic imaging of heart and coronary circulation: Secondary | ICD-10-CM

## 2021-09-24 NOTE — Addendum Note (Signed)
Addended by: Darrell Jewel on: 09/24/2021 08:48 AM ? ? Modules accepted: Orders ? ?

## 2021-09-24 NOTE — Telephone Encounter (Signed)
Spoke with patient this morning regarding CT scan results.  Her left-sided pulmonary veins have an abnormal takeoff from the left atrium secondary to draping over the aorta.  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  There is also a small masslike structure near the ostium of the right coronary artery which needs to be further evaluated with a cardiac MRI. ? ?We will cancel her ablation ? ?-Continue amiodarone with careful monitoring in the atrial fibrillation clinic ?-Cardiac MRI to further assess masslike structure adjacent to the right coronary artery ?-Follow-up with Dr. Margaretann Loveless who is an imaging specialist to further interpret cardiac MRI results and determine if any additional work-up/treatment is warranted ? ?Patient understanding of plan. ? ?Lysbeth Galas T. Quentin Ore, MD, [facility], [place] ?Cardiac Electrophysiology ? ?

## 2021-09-25 NOTE — Telephone Encounter (Signed)
Scheduled lab for patient and discussed MRI scheduling.  ? ?Verbalized understanding and agreement.  ?

## 2021-09-29 ENCOUNTER — Encounter (HOSPITAL_COMMUNITY): Admission: RE | Payer: No Typology Code available for payment source | Source: Home / Self Care

## 2021-09-29 ENCOUNTER — Ambulatory Visit (HOSPITAL_COMMUNITY)
Admission: RE | Admit: 2021-09-29 | Payer: No Typology Code available for payment source | Source: Home / Self Care | Admitting: Cardiology

## 2021-09-29 ENCOUNTER — Other Ambulatory Visit: Payer: No Typology Code available for payment source | Admitting: *Deleted

## 2021-09-29 DIAGNOSIS — R931 Abnormal findings on diagnostic imaging of heart and coronary circulation: Secondary | ICD-10-CM

## 2021-09-29 LAB — CBC
Hematocrit: 41.9 % (ref 34.0–46.6)
Hemoglobin: 13.9 g/dL (ref 11.1–15.9)
MCH: 29.9 pg (ref 26.6–33.0)
MCHC: 33.2 g/dL (ref 31.5–35.7)
MCV: 90 fL (ref 79–97)
Platelets: 198 10*3/uL (ref 150–450)
RBC: 4.65 x10E6/uL (ref 3.77–5.28)
RDW: 15.9 % — ABNORMAL HIGH (ref 11.7–15.4)
WBC: 7.5 10*3/uL (ref 3.4–10.8)

## 2021-09-29 SURGERY — ATRIAL FIBRILLATION ABLATION
Anesthesia: General

## 2021-10-27 ENCOUNTER — Ambulatory Visit (HOSPITAL_COMMUNITY): Payer: No Typology Code available for payment source | Admitting: Physician Assistant

## 2021-11-13 ENCOUNTER — Telehealth (HOSPITAL_COMMUNITY): Payer: Self-pay | Admitting: Emergency Medicine

## 2021-11-13 NOTE — Telephone Encounter (Signed)
Reaching out to patient to offer assistance regarding upcoming cardiac imaging study; pt verbalizes understanding of appt date/time, parking situation and where to check in, pre-test NPO status and medications ordered, and verified current allergies; name and call back number provided for further questions should they arise Tammy Bond RN Navigator Cardiac Imaging [name] [phone] office [phone] cell  Denies iv issues Denies metal implants Pt taking PO benadryl for claustro Arrival [phone]

## 2021-11-14 ENCOUNTER — Ambulatory Visit (HOSPITAL_COMMUNITY)
Admission: RE | Admit: 2021-11-14 | Discharge: 2021-11-14 | Disposition: A | Discharge status: Home / Self Care | Payer: No Typology Code available for payment source | Source: Ambulatory Visit | Attending: Cardiology | Admitting: Cardiology

## 2021-11-14 DIAGNOSIS — R931 Abnormal findings on diagnostic imaging of heart and coronary circulation: Secondary | ICD-10-CM | POA: Insufficient documentation

## 2021-11-14 MED ORDER — GADOBUTROL 1 MMOL/ML IV SOLN
10.0000 mL | Freq: Once | INTRAVENOUS | Status: AC | PRN
  Administered 2021-11-14: 10 mL via INTRAVENOUS

## 2021-11-20 ENCOUNTER — Telehealth: Payer: Self-pay | Admitting: Cardiology

## 2021-11-20 DIAGNOSIS — R931 Abnormal findings on diagnostic imaging of heart and coronary circulation: Secondary | ICD-10-CM

## 2021-11-20 NOTE — Telephone Encounter (Signed)
Patient is calling for results to her MRI

## 2021-11-20 NOTE — Telephone Encounter (Signed)
Returned call to patient.  Patient is calling for results of MRI from 11/14/21. She has not yet heard anything from our office.  No notes from Dr. Quentin Ore on MRI results seen. Will forward to Dr. Quentin Ore and his nurse Otila Kluver to review.

## 2021-11-21 ENCOUNTER — Encounter (HOSPITAL_COMMUNITY): Payer: Self-pay | Admitting: Nurse Practitioner

## 2021-11-25 ENCOUNTER — Other Ambulatory Visit: Payer: Self-pay | Admitting: Physician Assistant

## 2021-11-25 NOTE — Telephone Encounter (Signed)
Vickie Epley, MD  11/22/2021  2:17 PM EDT     MRI results show the abnormal structure adjacent to the aorta. I am going to have you see one of our cardiologists who specialize in cardiac imaging to help determine whether any additional testing is needed.   Otila Kluver, can you help get this appointment scheduled ASAP with Dr Margaretann Loveless?   Lysbeth Galas T. Quentin Ore, MD, [facility], [facility] Cardiac Electrophysiology   The patient has been notified of the result and verbalized understanding.  All questions (if any) were answered. Antonieta Iba, RN 11/25/2021 3:40 PM  Referral has been placed.

## 2021-11-25 NOTE — Telephone Encounter (Signed)
Pt is returning calling back to get information on results. She states she was suppose to get call to set up appt with another doctor and yet to receive that call. Please advice.

## 2021-11-28 ENCOUNTER — Encounter (HOSPITAL_COMMUNITY): Payer: Self-pay | Admitting: Nurse Practitioner

## 2021-12-09 ENCOUNTER — Encounter (HOSPITAL_COMMUNITY): Payer: Self-pay | Admitting: Nurse Practitioner

## 2021-12-14 NOTE — H&P (View-Only) (Signed)
Cardiology Office Note:    Date:  12/18/2021   ID:  Tammy Davies, DOB 07/31/1960, MRN [id number]  PCP:  [facility], [place]  Cardiologist:  Sinclair Grooms, MD  Electrophysiologist:  Vickie Epley, MD   Referring MD: [facility], [place]   Chief Complaint  Patient presents with   Mass    History of Present Illness:    Tammy Davies is a 61 y.o. female with a hx of persistent atrial fibrillation, hypertension, tobacco use.  She was admitted 06/05/2020 through 06/28/2020 with sigmoid diverticulitis complicated by abscess, underwent Hartmann colectomy/colostomy.  Course complicated by persistent atrial fibrillation/flutter.  She was referred to A-fib clinic and ultimately underwent DCCV x2 on 06/13/2021 and 06/2021.  She was started on Tikosyn.  She was admitted 08/02/2021 with recurrent rapid A-fib.  Tikosyn was discontinued and she was loaded with amiodarone.  A-fib/flutter ablation was planned with Dr. Quentin Ore on 09/29/2021.  However preablation CT was notable for 2.2 x 1.8 cm mass adjacent to right coronary ostium with evidence of vascularity.  Differential included thrombosed coronary aneurysm, saccular thrombosed aortic aneurysm, or mediastinal soft tissue mass.  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  She reports that she is doing well.  Denies any chest pain,  lightheadedness, syncope, lower extremity edema, or palpitations.  Does report has been having some dyspnea on exertion.  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) Echocardiogram 1/19: EF 60-65   History of COVID-19    Hypertension  06/08/2017   OSA (obstructive sleep apnea)    Persistent atrial fibrillation ([place])    Persistent since admx w diverticulitis in 1/22  //  failed DCCV in 3/22  //  Dofetilide load in 1/23 >> failed DCCV x 2   Tobacco abuse     Past Surgical History:  Procedure Laterality Date   ABDOMINAL HYSTERECTOMY     CARDIOVERSION N/A 07/22/2020   Procedure: CARDIOVERSION;  Surgeon: [name], MD;  Location: [facility] ENDOSCOPY;  Service: Cardiovascular;  Laterality: N/A;   CARDIOVERSION N/A 05/28/2021   Procedure: CARDIOVERSION;  Surgeon: Geralynn Rile, MD;  Location: [facility];  Service: Cardiovascular;  Laterality: N/A;   CARDIOVERSION N/A 06/13/2021   Procedure: CARDIOVERSION;  Surgeon: Berniece Salines, DO;  Location: [facility] ENDOSCOPY;  Service: Cardiovascular;  Laterality: N/A;   CARDIOVERSION N/A 07/15/2021   Procedure: CARDIOVERSION;  Surgeon: Vickie Epley, MD;  Location: [facility] CV LAB;  Service: Cardiovascular;  Laterality: N/A;   CHOLECYSTECTOMY N/A 08/18/2016   Procedure: LAPAROSCOPIC CHOLECYSTECTOMY WITH  INTRAOPERATIVE CHOLANGIOGRAM;  Surgeon: Johnathan Hausen, MD;  Location: [facility] OR[facility];  Service: General;  Laterality: N/A;   COLECTOMY WITH COLOSTOMY CREATION/HARTMANN PROCEDURE N/A 06/18/2020   Procedure: SIGMOID COLECTOMY WITH COLOSTOMY CREATION; DRAINAGE OF PELVIC ABSCESS;  Surgeon: Rolm Bookbinder, MD;  Location: [facility];  Service: General;  Laterality: N/A;   CYSTOSCOPY WITH STENT PLACEMENT Bilateral 06/18/2020   Procedure: CYSTOSCOPY WITH STENT PLACEMENT;  Surgeon: Alexis Frock, MD;  Location: [facility];  Service: Urology;  Laterality: Bilateral;   LAPAROTOMY N/A 06/18/2020   Procedure: EXPLORATORY LAPAROTOMY;  Surgeon: Rolm Bookbinder, MD;  Location: [facility];  Service: General;  Laterality: N/A;    Current Medications: Current Meds  Medication Sig   acetaminophen (TYLENOL) 500 MG tablet Take 1,000 mg by mouth every 6 (six) hours as needed for mild pain, fever or headache.    amiodarone (PACERONE) 200 MG tablet Take 2 tablets (400 mg total) by mouth 2 (two) times daily. Take 2 tabs twice a day (400 mg total twice per day)  for 1 week Decrease to 2 tabs daily on 08/11/21   amiodarone (PACERONE) 200 MG tablet Take 1 tablet by mouth daily.   apixaban (ELIQUIS) 5 MG TABS tablet Take 1 tablet (5 mg total) by mouth 2 (two) times daily.   furosemide (LASIX) 20 MG tablet Take 1 tablet (20 mg total) by mouth every other day.   Magnesium 250 MG TABS Take 250 mg by mouth daily.   Magnesium Oxide 400 MG CAPS Take 1 capsule (400 mg total) by mouth daily.   metoprolol succinate (TOPROL XL) 25 MG 24 hr tablet Take 3 tablets (75 mg total) by mouth daily. Take with or immediately following a meal.   potassium chloride (KLOR-CON) 10 MEQ tablet Take 1 tablet by mouth once daily (Patient taking differently: Take 10 mEq by mouth 2 (two) times daily.)   sacubitril-valsartan (ENTRESTO) 24-26 MG Take 1 tablet by mouth twice daily     Allergies:   Heparin   Social History   Socioeconomic History   Marital status: Single    Spouse name: Not on file   Number of children: Not on file   Years of education: Not on file   Highest education level: Not on file  Occupational History   Not on file  Tobacco Use   Smoking status: Every Day    Types: Cigarettes    Start date: 50   Smokeless tobacco: Never   Tobacco comments:    A Pack every 2 weeks 08/05/21  Vaping Use   Vaping Use: Never used  Substance and Sexual Activity   Alcohol use: No   Drug use: Yes    Types: Marijuana    Comment: last used Christmas   Sexual activity: Never  Other Topics Concern   Not on file  Social History Narrative   Not on file   Social Determinants of Health   Financial Resource Strain: Not on file  Food Insecurity: Not on file  Transportation Needs: Not on file  Physical Activity: Not on file  Stress: Not on file  Social Connections: Not on file     Family History: The patient's family  history includes Atrial fibrillation in her father and mother; Breast cancer in her mother; Diabetes in her father; Heart failure in her father and mother. There is no history of Colon cancer, Stomach cancer, Esophageal cancer, Pancreatic cancer, or Liver disease.  ROS:   Please see the history of present illness.     All other systems reviewed and are negative.  EKGs/Labs/Other Studies Reviewed:    The following studies were reviewed today:   EKG:   12/18/21: sinus bradycardia, rate 52, LBBB  Recent Labs: 08/02/2021: ALT 13; B Natriuretic Peptide 220.9; TSH 5.714 08/03/2021: Magnesium 1.9 09/10/2021: BUN 17; Creatinine, Ser 0.93; Potassium CANCELED; Sodium CANCELED 09/29/2021: Hemoglobin 13.9; Platelets 198  Recent Lipid Panel    Component Value Date/Time   CHOL 173 06/08/2017 0819   TRIG 124 06/22/2020 0935   HDL 40 (L) 06/08/2017 0819   CHOLHDL 4.3 06/08/2017 0819   VLDL 23 06/08/2017 0819   LDLCALC 110 (H) 06/08/2017  0819    Physical Exam:    VS:  BP (!) 140/84   Pulse 60   Ht 5' 8"  (1.727 m)   Wt 268 lb 6.4 oz (121.7 kg)   SpO2 97%   BMI 40.81 kg/m     Wt Readings from Last 3 Encounters:  12/18/21 268 lb 6.4 oz (121.7 kg)  08/05/21 252 lb (114.3 kg)  08/02/21 249 lb 6.4 oz (113.1 kg)     GEN:  Well nourished, well developed in no acute distress HEENT: Normal NECK: No JVD; No carotid bruits LYMPHATICS: No lymphadenopathy CARDIAC: RRR, no murmurs, rubs, gallops RESPIRATORY:  Clear to auscultation without rales, wheezing or rhonchi  ABDOMEN: Soft, non-tender, non-distended MUSCULOSKELETAL:  No edema; No deformity  SKIN: Warm and dry NEUROLOGIC:  Alert and oriented x 3 PSYCHIATRIC:  Normal affect   ASSESSMENT:    1. Cardiac mass   2. Chronic systolic heart failure ([place])   3. Pre-procedure lab exam   4. Pre-procedural examination   5. Persistent atrial fibrillation ([facility])    PLAN:    Mass: preablation CT was notable for 2.2 x 1.8 cm mass adjacent to right  coronary ostium with evidence of vascularity.  Differential included thrombosed coronary aneurysm, saccular thrombosed aortic aneurysm, or mediastinal soft tissue mass.  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  It does look like there is a small connection between the mass and the aorta, which explains the small amount of LGE but mass appears primarily thrombus.  Recommend cardiac catheterization for further evaluation -Risks and benefits of cardiac catheterization have been discussed with the patient.  These include bleeding, infection, kidney damage, stroke, heart attack, death.  The patient understands these risks and is willing to proceed.  Persistent atrial fibrillation: Diagnosed after admission with diverticulitis 05/2020.  Failed DCCV 07/2020.  Loaded with Tikosyn and underwent DCCV 05/2021 but had early return of A-fib.  DCCV 07/4285 also complicated by early return of A-fib.  Switched to amiodarone.  Currently on amiodarone 200 mg daily, appears to be maintaining sinus rhythm.  On Eliquis 5 mg twice daily for anticoagulation.  On Toprol-XL 75 mg daily.  Follows with Dr. Quentin Ore in EP, planning ablation  Chronic systolic heart failure: Echocardiogram 04/07/2021 with EF 30 to 35%, global hypokinesis.  Echocardiogram 09/10/2021 EF 40 to 45%, global hypokinesis.  Cardiac MRI 11/14/2021 EF 46%, septal lateral dyssynchrony consistent with LBBB, no myocardial LGE.  Suspect tachycardia induced versus due to left bundle branch block.  Ischemic cardiomyopathy also on differential -Continue Entresto 24-26 mg twice daily -Continue Toprol-XL 75 mg daily -Continue Lasix as needed -Plan LHC as above  RTC in 1 month  Medication Adjustments/Labs and Tests Ordered: Current medicines are reviewed at length with the patient today.  Concerns regarding medicines are outlined  above.  Orders Placed This Encounter  Procedures   Basic Metabolic Panel (BMET)   CBC with Differential/Platelet   EKG 12-Lead   Meds ordered this encounter  Medications   amiodarone (PACERONE) 200 MG tablet    Sig: Take 1 tablet by mouth daily.    Dispense:  30 tablet    Refill:  0    Patient Instructions  Medication Instructions:  Your physician recommends that you continue on your current medications as directed. Please refer to the Current Medication list given to you today.  *If you need a refill on your cardiac medications  before your next appointment, please call your pharmacy*   Lab Work: TODAY: BMET, CBC If you have labs (blood work) drawn today and your tests are completely normal, you will receive your results only by: [place] (if you have MyChart) OR A paper copy in the mail If you have any lab test that is abnormal or we need to change your treatment, we will call you to review the results.   Testing/Procedures:  [place] [address] Dept: [phone] Loc: [phone]  ISSABELA LESKO  12/18/2021  You are scheduled for a Cardiac Catheterization on Thursday, August 10 with Dr. Daneen Schick.  1. Please arrive at the Main Entrance A at [facility]: [place], [place] [address] at 8:30 AM (This time is two hours before your procedure to ensure your preparation). Free valet parking service is available.   Special note: Every effort is made to have your procedure done on time. Please understand that emergencies sometimes delay scheduled procedures.  2. Diet: Do not eat solid foods after midnight.  You may have clear liquids until 5 AM upon the day of the procedure.  3. Labs: You will need to have blood drawn on TODAY.   4. Medication instructions in preparation for your procedure:   Contrast Allergy: No  Stop  taking Eliquis (Apixiban) on Tuesday, August 8.  On the morning of your procedure, take Aspirin and any morning medicines NOT listed above.  You may use sips of water.  5. Plan to go home the same day, you will only stay overnight if medically necessary. 6. You MUST have a responsible adult to drive you home. 7. An adult MUST be with you the first 24 hours after you arrive home. 8. Bring a current list of your medications, and the last time and date medication taken. 9. Bring ID and current insurance cards. 10.Please wear clothes that are easy to get on and off and wear slip-on shoes.  Thank you for allowing [place] to care for you!   -- [facility]    Follow-Up: At [facility], you and your health needs are our priority.  As part of our continuing mission to provide you with exceptional heart care, we have created designated Provider Care Teams.  These Care Teams include your primary Cardiologist (physician) and Advanced Practice Providers (APPs -  Physician Assistants and Nurse Practitioners) who all work together to provide you with the care you need, when you need it.  We recommend signing up for the patient portal called "MyChart".  Sign up information is provided on this After Visit Summary.  MyChart is used to connect with patients for Virtual Visits (Telemedicine).  Patients are able to view lab/test results, encounter notes, upcoming appointments, etc.  Non-urgent messages can be sent to your provider as well.   To learn more about what you can do with MyChart, go to [email or web address].    Your next appointment:   1 month(s)  The format for your next appointment:   In Person  Provider:   Sinclair Grooms, MD  or Richardson Dopp, [place]   Other Instructions   Important Information About Sugar         Signed, Donato Heinz, MD  12/18/2021 11:28 PM    [place]

## 2021-12-14 NOTE — Progress Notes (Signed)
Cardiology Office Note:    Date:  12/18/2021   ID:  Tammy Davies, DOB 05/10/1961, MRN [id number]  PCP:  [facility] [place]  Cardiologist:  Sinclair Grooms, MD  Electrophysiologist:  Vickie Epley, MD   Referring MD: [facility] [place]   Chief Complaint  Patient presents with   Mass    History of Present Illness:    Tammy Davies is a 61 y.o. female with a hx of persistent atrial fibrillation, hypertension, tobacco use.  She was admitted 06/05/2020 through 06/28/2020 with sigmoid diverticulitis complicated by abscess, underwent Hartmann colectomy/colostomy.  Course complicated by persistent atrial fibrillation/flutter.  She was referred to A-fib clinic and ultimately underwent DCCV x2 on 06/13/2021 and 06/2021.  She was started on Tikosyn.  She was admitted 08/02/2021 with recurrent rapid A-fib.  Tikosyn was discontinued and she was loaded with amiodarone.  A-fib/flutter ablation was planned with Dr. Quentin Ore on 09/29/2021.  However preablation CT was notable for 2.2 x 1.8 cm mass adjacent to right coronary ostium with evidence of vascularity.  Differential included thrombosed coronary aneurysm, saccular thrombosed aortic aneurysm, or mediastinal soft tissue mass.  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  She reports that she is doing well.  Denies any chest pain,  lightheadedness, syncope, lower extremity edema, or palpitations.  Does report has been having some dyspnea on exertion.  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) Echocardiogram 1/19: EF 60-65   History of COVID-19    Hypertension  06/08/2017   OSA (obstructive sleep apnea)    Persistent atrial fibrillation ([place])    Persistent since admx w diverticulitis in 1/22  //  failed DCCV in 3/22  //  Dofetilide load in 1/23 >> failed DCCV x 2   Tobacco abuse     Past Surgical History:  Procedure Laterality Date   ABDOMINAL HYSTERECTOMY     CARDIOVERSION N/A 07/22/2020   Procedure: CARDIOVERSION;  Surgeon: [name], MD;  Location: [facility] ENDOSCOPY;  Service: Cardiovascular;  Laterality: N/A;   CARDIOVERSION N/A 05/28/2021   Procedure: CARDIOVERSION;  Surgeon: Geralynn Rile, MD;  Location: [place];  Service: Cardiovascular;  Laterality: N/A;   CARDIOVERSION N/A 06/13/2021   Procedure: CARDIOVERSION;  Surgeon: Berniece Salines, DO;  Location: [place] ENDOSCOPY;  Service: Cardiovascular;  Laterality: N/A;   CARDIOVERSION N/A 07/15/2021   Procedure: CARDIOVERSION;  Surgeon: Vickie Epley, MD;  Location: [place] CV LAB;  Service: Cardiovascular;  Laterality: N/A;   CHOLECYSTECTOMY N/A 08/18/2016   Procedure: LAPAROSCOPIC CHOLECYSTECTOMY WITH  INTRAOPERATIVE CHOLANGIOGRAM;  Surgeon: Johnathan Hausen, MD;  Location: [facility] ORS;  Service: General;  Laterality: N/A;   COLECTOMY WITH COLOSTOMY CREATION/HARTMANN PROCEDURE N/A 06/18/2020   Procedure: SIGMOID COLECTOMY WITH COLOSTOMY CREATION; DRAINAGE OF PELVIC ABSCESS;  Surgeon: Rolm Bookbinder, MD;  Location: [place];  Service: General;  Laterality: N/A;   CYSTOSCOPY WITH STENT PLACEMENT Bilateral 06/18/2020   Procedure: CYSTOSCOPY WITH STENT PLACEMENT;  Surgeon: Alexis Frock, MD;  Location: [place];  Service: Urology;  Laterality: Bilateral;   LAPAROTOMY N/A 06/18/2020   Procedure: EXPLORATORY LAPAROTOMY;  Surgeon: Rolm Bookbinder, MD;  Location: [place];  Service: General;  Laterality: N/A;    Current Medications: Current Meds  Medication Sig   acetaminophen (TYLENOL) 500 MG tablet Take 1,000 mg by mouth every 6 (six) hours as needed for mild pain, fever or headache.    amiodarone (PACERONE) 200 MG tablet Take 2 tablets (400 mg total) by mouth 2 (two) times daily. Take 2 tabs twice a day (400 mg total twice per day)  for 1 week Decrease to 2 tabs daily on 08/11/21   amiodarone (PACERONE) 200 MG tablet Take 1 tablet by mouth daily.   apixaban (ELIQUIS) 5 MG TABS tablet Take 1 tablet (5 mg total) by mouth 2 (two) times daily.   furosemide (LASIX) 20 MG tablet Take 1 tablet (20 mg total) by mouth every other day.   Magnesium 250 MG TABS Take 250 mg by mouth daily.   Magnesium Oxide 400 MG CAPS Take 1 capsule (400 mg total) by mouth daily.   metoprolol succinate (TOPROL XL) 25 MG 24 hr tablet Take 3 tablets (75 mg total) by mouth daily. Take with or immediately following a meal.   potassium chloride (KLOR-CON) 10 MEQ tablet Take 1 tablet by mouth once daily (Patient taking differently: Take 10 mEq by mouth 2 (two) times daily.)   sacubitril-valsartan (ENTRESTO) 24-26 MG Take 1 tablet by mouth twice daily     Allergies:   Heparin   Social History   Socioeconomic History   Marital status: Single    Spouse name: Not on file   Number of children: Not on file   Years of education: Not on file   Highest education level: Not on file  Occupational History   Not on file  Tobacco Use   Smoking status: Every Day    Types: Cigarettes    Start date: 46   Smokeless tobacco: Never   Tobacco comments:    A Pack every 2 weeks 08/05/21  Vaping Use   Vaping Use: Never used  Substance and Sexual Activity   Alcohol use: No   Drug use: Yes    Types: Marijuana    Comment: last used Christmas   Sexual activity: Never  Other Topics Concern   Not on file  Social History Narrative   Not on file   Social Determinants of Health   Financial Resource Strain: Not on file  Food Insecurity: Not on file  Transportation Needs: Not on file  Physical Activity: Not on file  Stress: Not on file  Social Connections: Not on file     Family History: The patient's family  history includes Atrial fibrillation in her father and mother; Breast cancer in her mother; Diabetes in her father; Heart failure in her father and mother. There is no history of Colon cancer, Stomach cancer, Esophageal cancer, Pancreatic cancer, or Liver disease.  ROS:   Please see the history of present illness.     All other systems reviewed and are negative.  EKGs/Labs/Other Studies Reviewed:    The following studies were reviewed today:   EKG:   12/18/21: sinus bradycardia, rate 52, LBBB  Recent Labs: 08/02/2021: ALT 13; B Natriuretic Peptide 220.9; TSH 5.714 08/03/2021: Magnesium 1.9 09/10/2021: BUN 17; Creatinine, Ser 0.93; Potassium CANCELED; Sodium CANCELED 09/29/2021: Hemoglobin 13.9; Platelets 198  Recent Lipid Panel    Component Value Date/Time   CHOL 173 06/08/2017 0819   TRIG 124 06/22/2020 0935   HDL 40 (L) 06/08/2017 0819   CHOLHDL 4.3 06/08/2017 0819   VLDL 23 06/08/2017 0819   LDLCALC 110 (H) 06/08/2017  0819    Physical Exam:    VS:  BP (!) 140/84   Pulse 60   Ht 5' 8"  (1.727 m)   Wt 268 lb 6.4 oz (121.7 kg)   SpO2 97%   BMI 40.81 kg/m     Wt Readings from Last 3 Encounters:  12/18/21 268 lb 6.4 oz (121.7 kg)  08/05/21 252 lb (114.3 kg)  08/02/21 249 lb 6.4 oz (113.1 kg)     GEN:  Well nourished, well developed in no acute distress HEENT: Normal NECK: No JVD; No carotid bruits LYMPHATICS: No lymphadenopathy CARDIAC: RRR, no murmurs, rubs, gallops RESPIRATORY:  Clear to auscultation without rales, wheezing or rhonchi  ABDOMEN: Soft, non-tender, non-distended MUSCULOSKELETAL:  No edema; No deformity  SKIN: Warm and dry NEUROLOGIC:  Alert and oriented x 3 PSYCHIATRIC:  Normal affect   ASSESSMENT:    1. Cardiac mass   2. Chronic systolic heart failure ([place])   3. Pre-procedure lab exam   4. Pre-procedural examination   5. Persistent atrial fibrillation ([facility])    PLAN:    Mass: preablation CT was notable for 2.2 x 1.8 cm mass adjacent to right  coronary ostium with evidence of vascularity.  Differential included thrombosed coronary aneurysm, saccular thrombosed aortic aneurysm, or mediastinal soft tissue mass.  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  It does look like there is a small connection between the mass and the aorta, which explains the small amount of LGE but mass appears primarily thrombus.  Recommend cardiac catheterization for further evaluation -Risks and benefits of cardiac catheterization have been discussed with the patient.  These include bleeding, infection, kidney damage, stroke, heart attack, death.  The patient understands these risks and is willing to proceed.  Persistent atrial fibrillation: Diagnosed after admission with diverticulitis 05/2020.  Failed DCCV 07/2020.  Loaded with Tikosyn and underwent DCCV 05/2021 but had early return of A-fib.  DCCV 01/1504 also complicated by early return of A-fib.  Switched to amiodarone.  Currently on amiodarone 200 mg daily, appears to be maintaining sinus rhythm.  On Eliquis 5 mg twice daily for anticoagulation.  On Toprol-XL 75 mg daily.  Follows with Dr. Quentin Ore in EP, planning ablation  Chronic systolic heart failure: Echocardiogram 04/07/2021 with EF 30 to 35%, global hypokinesis.  Echocardiogram 09/10/2021 EF 40 to 45%, global hypokinesis.  Cardiac MRI 11/14/2021 EF 46%, septal lateral dyssynchrony consistent with LBBB, no myocardial LGE.  Suspect tachycardia induced versus due to left bundle branch block.  Ischemic cardiomyopathy also on differential -Continue Entresto 24-26 mg twice daily -Continue Toprol-XL 75 mg daily -Continue Lasix as needed -Plan LHC as above  RTC in 1 month  Medication Adjustments/Labs and Tests Ordered: Current medicines are reviewed at length with the patient today.  Concerns regarding medicines are outlined  above.  Orders Placed This Encounter  Procedures   Basic Metabolic Panel (BMET)   CBC with Differential/Platelet   EKG 12-Lead   Meds ordered this encounter  Medications   amiodarone (PACERONE) 200 MG tablet    Sig: Take 1 tablet by mouth daily.    Dispense:  30 tablet    Refill:  0    Patient Instructions  Medication Instructions:  Your physician recommends that you continue on your current medications as directed. Please refer to the Current Medication list given to you today.  *If you need a refill on your cardiac medications  before your next appointment, please call your pharmacy*   Lab Work: TODAY: BMET, CBC If you have labs (blood work) drawn today and your tests are completely normal, you will receive your results only by: [place] (if you have MyChart) OR A paper copy in the mail If you have any lab test that is abnormal or we need to change your treatment, we will call you to review the results.   Testing/Procedures:  [place] [address] Dept: [phone] Loc: [phone]  Tammy Davies  12/18/2021  You are scheduled for a Cardiac Catheterization on Thursday, August 10 with Dr. Daneen Schick.  1. Please arrive at the Main Entrance A at [facility]: [place], [place] [address] at 8:30 AM (This time is two hours before your procedure to ensure your preparation). Free valet parking service is available.   Special note: Every effort is made to have your procedure done on time. Please understand that emergencies sometimes delay scheduled procedures.  2. Diet: Do not eat solid foods after midnight.  You may have clear liquids until 5 AM upon the day of the procedure.  3. Labs: You will need to have blood drawn on TODAY.   4. Medication instructions in preparation for your procedure:   Contrast Allergy: No  Stop  taking Eliquis (Apixiban) on Tuesday, August 8.  On the morning of your procedure, take Aspirin and any morning medicines NOT listed above.  You may use sips of water.  5. Plan to go home the same day, you will only stay overnight if medically necessary. 6. You MUST have a responsible adult to drive you home. 7. An adult MUST be with you the first 24 hours after you arrive home. 8. Bring a current list of your medications, and the last time and date medication taken. 9. Bring ID and current insurance cards. 10.Please wear clothes that are easy to get on and off and wear slip-on shoes.  Thank you for allowing [place] to care for you!   -- [place] [facility]    Follow-Up: At [facility], you and your health needs are our priority.  As part of our continuing mission to provide you with exceptional heart care, we have created designated Provider Care Teams.  These Care Teams include your primary Cardiologist (physician) and Advanced Practice Providers (APPs -  Physician Assistants and Nurse Practitioners) who all work together to provide you with the care you need, when you need it.  We recommend signing up for the patient portal called "MyChart".  Sign up information is provided on this After Visit Summary.  MyChart is used to connect with patients for Virtual Visits (Telemedicine).  Patients are able to view lab/test results, encounter notes, upcoming appointments, etc.  Non-urgent messages can be sent to your provider as well.   To learn more about what you can do with MyChart, go to [email or web address].    Your next appointment:   1 month(s)  The format for your next appointment:   In Person  Provider:   Sinclair Grooms, MD  or Richardson Dopp, [place]   Other Instructions   Important Information About Sugar         Signed, Donato Heinz, MD  12/18/2021 11:28 PM    [place]

## 2021-12-18 ENCOUNTER — Encounter: Payer: Self-pay | Admitting: Cardiology

## 2021-12-18 ENCOUNTER — Telehealth: Payer: Self-pay | Admitting: Interventional Cardiology

## 2021-12-18 ENCOUNTER — Ambulatory Visit (INDEPENDENT_AMBULATORY_CARE_PROVIDER_SITE_OTHER): Payer: No Typology Code available for payment source | Admitting: Cardiology

## 2021-12-18 ENCOUNTER — Other Ambulatory Visit (HOSPITAL_COMMUNITY): Payer: Self-pay

## 2021-12-18 VITALS — BP 140/84 | HR 60 | Ht 68.0 in | Wt 268.4 lb

## 2021-12-18 DIAGNOSIS — I5022 Chronic systolic (congestive) heart failure: Secondary | ICD-10-CM | POA: Diagnosis not present

## 2021-12-18 DIAGNOSIS — I5189 Other ill-defined heart diseases: Secondary | ICD-10-CM | POA: Diagnosis not present

## 2021-12-18 DIAGNOSIS — Z01812 Encounter for preprocedural laboratory examination: Secondary | ICD-10-CM | POA: Diagnosis not present

## 2021-12-18 DIAGNOSIS — Z01818 Encounter for other preprocedural examination: Secondary | ICD-10-CM

## 2021-12-18 DIAGNOSIS — I4819 Other persistent atrial fibrillation: Secondary | ICD-10-CM

## 2021-12-18 MED ORDER — AMIODARONE HCL 200 MG PO TABS
200.0000 mg | ORAL_TABLET | Freq: Every day | ORAL | 0 refills | Status: DC
  Filled 2021-12-18: qty 30, 30d supply, fill #0

## 2021-12-18 NOTE — Telephone Encounter (Signed)
Left message to call back  

## 2021-12-18 NOTE — Telephone Encounter (Signed)
Patient called wanting to reschedule her left heart cath and coronary angio next week (8/10) as she will not have someone to be with her.

## 2021-12-18 NOTE — Patient Instructions (Addendum)
Medication Instructions:  Your physician recommends that you continue on your current medications as directed. Please refer to the Current Medication list given to you today.  *If you need a refill on your cardiac medications before your next appointment, please call your pharmacy*   Lab Work: TODAY: BMET, CBC If you have labs (blood work) drawn today and your tests are completely normal, you will receive your results only by: [place] (if you have MyChart) OR A paper copy in the mail If you have any lab test that is abnormal or we need to change your treatment, we will call you to review the results.   Testing/Procedures:  [place] [address] Dept: [phone] Loc: [phone]  LEXIS POTENZA  12/18/2021  You are scheduled for a Cardiac Catheterization on Thursday, August 10 with Dr. Daneen Schick.  1. Please arrive at the Main Entrance A at [facility]: [place], [place] [address] at 8:30 AM (This time is two hours before your procedure to ensure your preparation). Free valet parking service is available.   Special note: Every effort is made to have your procedure done on time. Please understand that emergencies sometimes delay scheduled procedures.  2. Diet: Do not eat solid foods after midnight.  You may have clear liquids until 5 AM upon the day of the procedure.  3. Labs: You will need to have blood drawn on TODAY.   4. Medication instructions in preparation for your procedure:   Contrast Allergy: No  Stop taking Eliquis (Apixiban) on Tuesday, August 8.  On the morning of your procedure, take Aspirin and any morning medicines NOT listed above.  You may use sips of water.  5. Plan to go home the same day, you will only stay overnight if medically necessary. 6. You MUST have a responsible adult to drive you home. 7. An adult  MUST be with you the first 24 hours after you arrive home. 8. Bring a current list of your medications, and the last time and date medication taken. 9. Bring ID and current insurance cards. 10.Please wear clothes that are easy to get on and off and wear slip-on shoes.  Thank you for allowing [place] to care for you!   -- [place] [facility] services    Follow-Up: At [facility], you and your health needs are our priority.  As part of our continuing mission to provide you with exceptional heart care, we have created designated Provider Care Teams.  These Care Teams include your primary Cardiologist (physician) and Advanced Practice Providers (APPs -  Physician Assistants and Nurse Practitioners) who all work together to provide you with the care you need, when you need it.  We recommend signing up for the patient portal called "MyChart".  Sign up information is provided on this After Visit Summary.  MyChart is used to connect with patients for Virtual Visits (Telemedicine).  Patients are able to view lab/test results, encounter notes, upcoming appointments, etc.  Non-urgent messages can be sent to your provider as well.   To learn more about what you can do with MyChart, go to [email or web address].    Your next appointment:   1 month(s)  The format for your next appointment:   In Person  Provider:   Sinclair Grooms, MD  or Richardson Dopp, [place]   Other Instructions   Important Information About Sugar

## 2021-12-19 LAB — BASIC METABOLIC PANEL
BUN/Creatinine Ratio: 12 (ref 12–28)
BUN: 12 mg/dL (ref 8–27)
CO2: 22 mmol/L (ref 20–29)
Calcium: 9.2 mg/dL (ref 8.7–10.3)
Chloride: 103 mmol/L (ref 96–106)
Creatinine, Ser: 1 mg/dL (ref 0.57–1.00)
Glucose: 107 mg/dL — ABNORMAL HIGH (ref 70–99)
Potassium: 4 mmol/L (ref 3.5–5.2)
Sodium: 139 mmol/L (ref 134–144)
eGFR: 64 mL/min/{1.73_m2} (ref >59)

## 2021-12-19 LAB — CBC WITH DIFFERENTIAL/PLATELET
Basophils Absolute: 0 10*3/uL (ref 0.0–0.2)
Basos: 1 %
EOS (ABSOLUTE): 0.2 10*3/uL (ref 0.0–0.4)
Eos: 4 %
Hematocrit: 40.7 % (ref 34.0–46.6)
Hemoglobin: 14.1 g/dL (ref 11.1–15.9)
Immature Grans (Abs): 0 10*3/uL (ref 0.0–0.1)
Immature Granulocytes: 1 %
Lymphocytes Absolute: 1.5 10*3/uL (ref 0.7–3.1)
Lymphs: 24 %
MCH: 30.9 pg (ref 26.6–33.0)
MCHC: 34.6 g/dL (ref 31.5–35.7)
MCV: 89 fL (ref 79–97)
Monocytes Absolute: 0.4 10*3/uL (ref 0.1–0.9)
Monocytes: 6 %
Neutrophils Absolute: 4.1 10*3/uL (ref 1.4–7.0)
Neutrophils: 64 %
Platelets: 210 10*3/uL (ref 150–450)
RBC: 4.57 x10E6/uL (ref 3.77–5.28)
RDW: 13 % (ref 11.7–15.4)
WBC: 6.3 10*3/uL (ref 3.4–10.8)

## 2021-12-24 NOTE — H&P (Signed)
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  Coronary angiography is requested to give further insight about the right coronary.

## 2021-12-25 ENCOUNTER — Encounter (HOSPITAL_COMMUNITY)
Admission: RE | Disposition: A | Discharge status: Home / Self Care | Payer: Self-pay | Source: Home / Self Care | Attending: Interventional Cardiology

## 2021-12-25 ENCOUNTER — Other Ambulatory Visit: Payer: Self-pay

## 2021-12-25 ENCOUNTER — Ambulatory Visit (HOSPITAL_COMMUNITY)
Admission: RE | Admit: 2021-12-25 | Discharge: 2021-12-25 | Disposition: A | Discharge status: Home / Self Care | Payer: No Typology Code available for payment source | Attending: Interventional Cardiology | Admitting: Interventional Cardiology

## 2021-12-25 DIAGNOSIS — G4733 Obstructive sleep apnea (adult) (pediatric): Secondary | ICD-10-CM | POA: Diagnosis present

## 2021-12-25 DIAGNOSIS — F17210 Nicotine dependence, cigarettes, uncomplicated: Secondary | ICD-10-CM | POA: Insufficient documentation

## 2021-12-25 DIAGNOSIS — I2510 Atherosclerotic heart disease of native coronary artery without angina pectoris: Secondary | ICD-10-CM | POA: Diagnosis not present

## 2021-12-25 DIAGNOSIS — Z79899 Other long term (current) drug therapy: Secondary | ICD-10-CM | POA: Insufficient documentation

## 2021-12-25 DIAGNOSIS — I110 Hypertensive heart disease with heart failure: Secondary | ICD-10-CM | POA: Insufficient documentation

## 2021-12-25 DIAGNOSIS — I4819 Other persistent atrial fibrillation: Secondary | ICD-10-CM | POA: Insufficient documentation

## 2021-12-25 DIAGNOSIS — I10 Essential (primary) hypertension: Secondary | ICD-10-CM | POA: Diagnosis present

## 2021-12-25 DIAGNOSIS — I5022 Chronic systolic (congestive) heart failure: Secondary | ICD-10-CM | POA: Diagnosis not present

## 2021-12-25 DIAGNOSIS — I480 Paroxysmal atrial fibrillation: Secondary | ICD-10-CM

## 2021-12-25 DIAGNOSIS — I5189 Other ill-defined heart diseases: Secondary | ICD-10-CM

## 2021-12-25 DIAGNOSIS — I2541 Coronary artery aneurysm: Secondary | ICD-10-CM

## 2021-12-25 DIAGNOSIS — Z7901 Long term (current) use of anticoagulants: Secondary | ICD-10-CM | POA: Diagnosis not present

## 2021-12-25 DIAGNOSIS — Z720 Tobacco use: Secondary | ICD-10-CM | POA: Diagnosis present

## 2021-12-25 DIAGNOSIS — I4891 Unspecified atrial fibrillation: Secondary | ICD-10-CM | POA: Diagnosis present

## 2021-12-25 HISTORY — PX: LEFT HEART CATH AND CORONARY ANGIOGRAPHY: CATH118249

## 2021-12-25 SURGERY — LEFT HEART CATH AND CORONARY ANGIOGRAPHY
Anesthesia: LOCAL

## 2021-12-25 MED ORDER — SODIUM CHLORIDE 0.9 % IV SOLN: 250.0000 mL | INTRAVENOUS | Status: DC | PRN

## 2021-12-25 MED ORDER — LIDOCAINE HCL (PF) 1 % IJ SOLN
INTRAMUSCULAR | Status: AC
  Filled 2021-12-25: qty 30

## 2021-12-25 MED ORDER — LIDOCAINE HCL (PF) 1 % IJ SOLN
INTRAMUSCULAR | Status: DC | PRN
  Administered 2021-12-25 (×2): 2 mL

## 2021-12-25 MED ORDER — SODIUM CHLORIDE 0.9% FLUSH: 3.0000 mL | INTRAVENOUS | Status: DC | PRN

## 2021-12-25 MED ORDER — MIDAZOLAM HCL 2 MG/2ML IJ SOLN
INTRAMUSCULAR | Status: DC | PRN
  Administered 2021-12-25 (×2): 1 mg via INTRAVENOUS

## 2021-12-25 MED ORDER — MIDAZOLAM HCL 2 MG/2ML IJ SOLN
INTRAMUSCULAR | Status: AC
  Filled 2021-12-25: qty 2

## 2021-12-25 MED ORDER — HYDRALAZINE HCL 20 MG/ML IJ SOLN: 10.0000 mg | INTRAMUSCULAR | Status: DC | PRN

## 2021-12-25 MED ORDER — APIXABAN 5 MG PO TABS: 5.0000 mg | ORAL_TABLET | Freq: Two times a day (BID) | ORAL | 1 refills | Status: DC

## 2021-12-25 MED ORDER — FENTANYL CITRATE (PF) 100 MCG/2ML IJ SOLN
INTRAMUSCULAR | Status: DC | PRN
Start: 2021-12-25 — End: 2021-12-25
  Administered 2021-12-25 (×2): 25 ug via INTRAVENOUS

## 2021-12-25 MED ORDER — SODIUM CHLORIDE 0.9 % IV SOLN: INTRAVENOUS | Status: DC

## 2021-12-25 MED ORDER — ACETAMINOPHEN 325 MG PO TABS: 650.0000 mg | ORAL_TABLET | ORAL | Status: DC | PRN

## 2021-12-25 MED ORDER — SODIUM CHLORIDE 0.9% FLUSH: 3.0000 mL | Freq: Two times a day (BID) | INTRAVENOUS | Status: DC

## 2021-12-25 MED ORDER — VERAPAMIL HCL 2.5 MG/ML IV SOLN
INTRAVENOUS | Status: DC | PRN
  Administered 2021-12-25: 3 mg via INTRAVENOUS

## 2021-12-25 MED ORDER — IOHEXOL 350 MG/ML SOLN
INTRAVENOUS | Status: DC | PRN
  Administered 2021-12-25: 65 mL

## 2021-12-25 MED ORDER — BIVALIRUDIN BOLUS VIA INFUSION - CUPID
INTRAVENOUS | Status: DC | PRN
  Administered 2021-12-25: 91.275 mg via INTRAVENOUS

## 2021-12-25 MED ORDER — VERAPAMIL HCL 2.5 MG/ML IV SOLN
INTRAVENOUS | Status: AC
Start: 2021-12-25 — End: ?
  Filled 2021-12-25: qty 2

## 2021-12-25 MED ORDER — ASPIRIN 81 MG PO CHEW: 81.0000 mg | CHEWABLE_TABLET | ORAL | Status: DC

## 2021-12-25 MED ORDER — ASPIRIN 81 MG PO CHEW: 81.0000 mg | CHEWABLE_TABLET | Freq: Every day | ORAL | Status: DC

## 2021-12-25 MED ORDER — LABETALOL HCL 5 MG/ML IV SOLN: 10.0000 mg | INTRAVENOUS | Status: DC | PRN

## 2021-12-25 MED ORDER — SODIUM CHLORIDE 0.9 % IV SOLN
INTRAVENOUS | Status: DC | PRN
  Administered 2021-12-25 (×2): 1000 mL via INTRAVENOUS

## 2021-12-25 MED ORDER — FENTANYL CITRATE (PF) 100 MCG/2ML IJ SOLN
INTRAMUSCULAR | Status: AC
  Filled 2021-12-25: qty 2

## 2021-12-25 MED ORDER — ONDANSETRON HCL 4 MG/2ML IJ SOLN: 4.0000 mg | Freq: Four times a day (QID) | INTRAMUSCULAR | Status: DC | PRN

## 2021-12-25 SURGICAL SUPPLY — 11 items
CATH 5FR JL3.5 JR4 ANG PIG MP (CATHETERS) ×1 IMPLANT
CATH LAUNCHER 6FR EBU3.5 (CATHETERS) ×1 IMPLANT
DEVICE RAD COMP TR BAND LRG (VASCULAR PRODUCTS) ×1 IMPLANT
GLIDESHEATH SLEND A-KIT 6F 22G (SHEATH) ×1 IMPLANT
GUIDEWIRE INQWIRE 1.5J.035X260 (WIRE) IMPLANT
INQWIRE 1.5J .035X260CM (WIRE) ×2
KIT HEART LEFT (KITS) ×2 IMPLANT
PACK CARDIAC CATHETERIZATION (CUSTOM PROCEDURE TRAY) ×2 IMPLANT
SHEATH PROBE COVER 6X72 (BAG) ×2 IMPLANT
TRANSDUCER W/STOPCOCK (MISCELLANEOUS) ×2 IMPLANT
TUBING CIL FLEX 10 FLL-RA (TUBING) ×2 IMPLANT

## 2021-12-25 NOTE — CV Procedure (Signed)
Codominant Normal RCA, circumflex, left main, and diffuse luminal irregularities in the LAD.  No significant obstructive disease noted. Mild global hypokinesis with EF in the 45 to 50% range.  EDP 13.

## 2021-12-25 NOTE — Progress Notes (Signed)
Patient was given discharge instructions. She verbalized understanding. 

## 2021-12-25 NOTE — Progress Notes (Signed)
TR BAND REMOVAL  LOCATION:    right radial  DEFLATED PER PROTOCOL:    Yes.    TIME BAND OFF / DRESSING APPLIED:    1445   SITE UPON ARRIVAL:    Level 0  SITE AFTER BAND REMOVAL:    Level 0  CIRCULATION SENSATION AND MOVEMENT:    Within Normal Limits   Yes.    COMMENTS:   No bleeding noted

## 2021-12-26 ENCOUNTER — Encounter (HOSPITAL_COMMUNITY): Payer: Self-pay | Admitting: Interventional Cardiology

## 2021-12-29 NOTE — Telephone Encounter (Signed)
Cath completed 8/10-per Dr. Armando Gang scan needed (non cardiac).  Order placed.

## 2021-12-31 ENCOUNTER — Ambulatory Visit: Payer: No Typology Code available for payment source | Admitting: Cardiology

## 2021-12-31 NOTE — Interval H&P Note (Signed)
Cath Lab Visit (complete for each Cath Lab visit)  Clinical Evaluation Leading to the Procedure:   ACS: No.  Non-ACS:    Anginal Classification: CCS II  Anti-ischemic medical therapy: Minimal Therapy (1 class of medications)  Non-Invasive Test Results: No non-invasive testing performed  Prior CABG: No previous CABG      History and Physical Interval Note:  12/31/2021 2:20 PM  Tammy Davies  has presented today for surgery, with the diagnosis of systolic heart failure.  The various methods of treatment have been discussed with the patient and family. After consideration of risks, benefits and other options for treatment, the patient has consented to  Procedure(s): LEFT HEART CATH AND CORONARY ANGIOGRAPHY (N/A) as a surgical intervention.  The patient's history has been reviewed, patient examined, no change in status, stable for surgery.  I have reviewed the patient's chart and labs.  Questions were answered to the patient's satisfaction.     Belva Crome III

## 2022-01-15 NOTE — Progress Notes (Signed)
Cardiology Office Note:    Date:  01/16/2022   ID:  Tammy Davies, DOB 11-30-1960, MRN [id number]  PCP:  [facility] [place] Providers Cardiologist:  Sinclair Grooms, MD Cardiology APP:  Liliane Shi, PA-C  Electrophysiologist:  Vickie Epley, MD    Referring MD: [facility] [place]   Chief Complaint:  Hospitalization Follow-up (S/p cardiac catheterization )    Patient Profile: Paroxysmal atrial fibrillation  Persistent AF after admx with diverticulitis in 1/22 S/p failed DCCV in 3/22 Dofetilide Rx load 05/2021 s/p DCCV >> ERAF Repeat DCCV on Dofetilide >> ERAF S/p DCCV in 06/2021  ? 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 Normal left main Normal large circumflex LAD is relatively small and has diffuse atherosclerosis but no high-grade obstruction. Mild global hypokinesis with EF 45%.  EDP 13 mmHg RECOMMENDATIONS: Discussed with Dr. Gardiner Rhyme.  He will proceed with PET scanning now thinking that the mass could be soft tissue. Home later today.  Okay to start apixaban later tonight or preferably in a.m.   MR CARDIAC MORPHOLOGY W WO CONTRAST 11/14/2021 IMPRESSION: 1. Normal LV size with mild LV hypertrophy. EF 46% with mild  diffuse hypokinesis and septal-lateral dyssynchrony consistent with LBBB.  2.  Normal RV size with low normal systolic function.  3. Small ovoid structure adjacent to the aorta at the anterior sinotubular junction but does not appear to be connected to the aorta. It is in close proximity to the origin of the RCA. The structure is fluid-containing with T2 brightness but it does not fill with first pass perfusion images. Would query thrombosed RCA aneurysm but not certain, alternatively a mediastinal soft tissue mass with vascularity.  4. No myocardial LGE, so no definitive evidence for prior MI, infiltrative disease, or myocarditis.     Cardiac CTA 09/23/21 IMPRESSION: 1. 2.2 x 1.8 cm mass adjacent to the right coronary ostium with evidence of vascularity. Indeterminate by this exam, recommend Cardiac MRI for further tissue characterization. Differential includes thrombosed coronary aneurysm, saccular thrombosed aortic aneurysm, or mediastinal soft tissue mass.   2. There is anatomically normal pulmonary vein drainage into the left atrium. Narrowing of the left lower pulmonary vein which appears compressed between the left atrium and aorta.   3. Normal left atrial appendage, no left atrial appendage thrombus.   4. The esophagus runs in the left atrial midline and is not in the proximity to any of the pulmonary veins.   5. Mild dilation of main pulmonary artery, 33 mm, may suggest pulmonary hypertension.  ECHO COMPLETE WITH IMAGING ENHANCING AGENT 09/10/2021 EF 40-45, global HK, GR 1 DD, normal RVSF, mild LAE, trivial MR, AV sclerosis without stenosis, aortic root 38 mm  Echocardiogram 08/02/2021 EF 40-45, normal RVSF  Echocardiogram 04/07/21 EF 30-35,  global HK, mild LVH, normal RVSF, mild LAE   Echocardiogram 06/09/17 EF 60-65  History of Present Illness:   Tammy Davies is a 61 y.o. female with the above problem list.  She was seen by Dr. Quentin Ore in February 2023  for EP evaluation.  She was noted to be back in atrial fibrillation with rapid rate.  She underwent cardioversion.  PVI ablation was arranged.  She was admitted again in March 2023 with atrial fibrillation with rapid rate.  Dofetilide was stopped and she was placed on amiodarone.  She converted to sinus rhythm.  Preablation CT demonstrated 2.2 x 1.8 cm mass adjacent to the right coronary ostium with evidence of vascularity.  Cardiac MRI was obtained and was concerning for thrombosed RCA aneurysm versus mediastinal soft tissue mass with vascularity.  She was evaluated by Dr. Gardiner Rhyme 12/18/2021.  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  Cardiac catheterization was recommended.  Cardiac catheterization 12/25/2021 demonstrated normal right coronary artery with no connection to a mass or evidence of tumor blush.  There was diffuse atherosclerosis and a small LAD but no high-grade obstruction and EF was 45.  Plan is to proceed with PET scan to assess for soft tissue mass.  This has not yet been scheduled.  She returns for follow-up.  She is here alone.  She continues to have shortness of breath with some activities.  She has not had chest pain, syncope.  She sleeps with CPAP.  She she has mild leg edema.     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   amiodarone (PACERONE) 200 MG tablet Take 1 tablet by mouth daily.   apixaban (ELIQUIS) 5 MG TABS tablet Take 1 tablet (5 mg total) by mouth 2 (two) times daily.   furosemide (LASIX) 20 MG tablet Take 20 mg by mouth as needed for fluid or edema.   Magnesium 250 MG TABS Take 250 mg by mouth daily.   metoprolol succinate (TOPROL XL) 25 MG 24 hr tablet Take 3 tablets (75 mg total) by mouth daily. Take with or immediately following a meal.   potassium chloride (KLOR-CON) 10 MEQ tablet Take 1 tablet by mouth once daily   sacubitril-valsartan (ENTRESTO) 24-26 MG Take 1 tablet by mouth twice daily    Allergies:   Heparin   Social History   Tobacco Use   Smoking status: Every Day    Types: Cigarettes    Start date: 1977   Smokeless tobacco: Never   Tobacco comments:    A Pack every 2 weeks 08/05/21  Vaping Use   Vaping Use: Never used  Substance Use  Topics   Alcohol use: No   Drug use: Yes    Types: Marijuana    Comment: last used Christmas    Family Hx: The patient's family history includes Atrial fibrillation in her father and mother; Breast cancer in her mother; Diabetes in her father; Heart failure in her father and mother. There is no history of Colon cancer, Stomach cancer, Esophageal cancer, Pancreatic cancer, or Liver disease.  Review of Systems  Gastrointestinal:  Negative for hematochezia and melena.  Genitourinary:  Negative for hematuria.     EKGs/Labs/Other Test Reviewed:    EKG:  EKG is   ordered today.  The ekg ordered today demonstrates NSR, HR 81, LBBB, QTc 448, no change from prior tracing  Recent Labs: 08/02/2021: ALT 13; B Natriuretic Peptide 220.9; TSH 5.714 08/03/2021: Magnesium 1.9 12/18/2021: BUN 12; Creatinine, Ser 1.00; Hemoglobin 14.1; Platelets 210; Potassium 4.0; Sodium 139   Recent Lipid Panel No results for  input(s): "CHOL", "TRIG", "HDL", "VLDL", "LDLCALC", "LDLDIRECT" in the last 8760 hours.   Risk Assessment/Calculations/Metrics:    CHA2DS2-VASc Score = 3   This indicates a 3.2% annual risk of stroke. The patient's score is based upon: CHF History: 1 HTN History: 1 Diabetes History: 0 Stroke History: 0 Vascular Disease History: 0 Age Score: 0 Gender Score: 1             Physical Exam:    VS:  BP 130/70   Pulse 81   Ht 5' 8"  (1.727 m)   Wt 268 lb 12.8 oz (121.9 kg)   SpO2 97%   BMI 40.87 kg/m     Wt Readings from Last 3 Encounters:  01/16/22 268 lb 12.8 oz (121.9 kg)  12/25/21 268 lb 6.4 oz (121.7 kg)  12/18/21 268 lb 6.4 oz (121.7 kg)    Constitutional:      Appearance: Healthy appearance. Not in distress.  Neck:     Vascular: No JVR. JVD normal.  Pulmonary:     Effort: Pulmonary effort is normal.     Breath sounds: No wheezing. No rales.  Cardiovascular:     Normal rate. Regular rhythm. Normal S1. Normal S2.      Murmurs: There is no murmur.     Comments: Right wrist without hematoma Edema:    Peripheral edema present.    Ankle: bilateral trace edema of the ankle. Abdominal:     Palpations: Abdomen is soft.  Skin:    General: Skin is warm and dry.  Neurological:     Mental Status: Alert and oriented to person, place and time.         ASSESSMENT & PLAN:   Cardiac mass No evidence of thrombosed RCA aneurysm on recent cardiac catheterization. PET scan pending.   HFrEF (heart failure with reduced ejection fraction) (HCC) EF 40-45 by echocardiogram in April 2023. NYHA IIb. Volume status appears stable. We discussed +/- SGLT2i. She has some issues w increased urinary frequency and incontinence. Therefore, will hold off on adding SGLT2i or MRA at this time.  Continue furosemide 20 mg as needed, metoprolol succinate 75 mg daily, Entresto 24/26 mg twice daily.  Follow-up with Dr. Tamala Julian in 3 months.  Persistent atrial fibrillation (HCC) Maintaining sinus rhythm  on amiodarone.  Continue Eliquis 5 mg twice daily, amiodarone 2 mg daily.  Recent creatinine, hemoglobin normal.  Obtain follow-up LFTs, TSH.  Once cardiac mass is better defined, consider referral back to EP for arranging PVI ablation.  Hypertension The patient's blood pressure is controlled on  her current regimen.  Continue current therapy.            Dispo:  Return in about 3 months (around 04/17/2022) for Routine Follow Up w/ Dr. Tamala Julian, or Richardson Dopp, PA-C.   Medication Adjustments/Labs and Tests Ordered: Current medicines are reviewed at length with the patient today.  Concerns regarding medicines are outlined above.  Tests Ordered: Orders Placed This Encounter  Procedures   Hepatic function panel   TSH   EKG 12-Lead   Medication Changes: No orders of the defined types were placed in this encounter.  Signed, Richardson Dopp, PA-C  01/16/2022 9:55 AM    [facility] [address] [place], [place], [place]  [address] Phone: [phone]; Fax: [phone]

## 2022-01-16 ENCOUNTER — Ambulatory Visit: Payer: No Typology Code available for payment source | Attending: Physician Assistant | Admitting: Physician Assistant

## 2022-01-16 ENCOUNTER — Encounter: Payer: Self-pay | Admitting: Physician Assistant

## 2022-01-16 VITALS — BP 130/70 | HR 81 | Ht 68.0 in | Wt 268.8 lb

## 2022-01-16 DIAGNOSIS — I4819 Other persistent atrial fibrillation: Secondary | ICD-10-CM | POA: Diagnosis not present

## 2022-01-16 DIAGNOSIS — I5189 Other ill-defined heart diseases: Secondary | ICD-10-CM | POA: Diagnosis not present

## 2022-01-16 DIAGNOSIS — I10 Essential (primary) hypertension: Secondary | ICD-10-CM

## 2022-01-16 DIAGNOSIS — I5020 Unspecified systolic (congestive) heart failure: Secondary | ICD-10-CM | POA: Diagnosis not present

## 2022-01-16 LAB — HEPATIC FUNCTION PANEL
ALT: 21 IU/L (ref 0–32)
AST: 16 IU/L (ref 0–40)
Albumin: 4.1 g/dL (ref 3.8–4.9)
Alkaline Phosphatase: 135 IU/L — ABNORMAL HIGH (ref 44–121)
Bilirubin Total: 0.4 mg/dL (ref 0.0–1.2)
Bilirubin, Direct: 0.11 mg/dL (ref 0.00–0.40)
Total Protein: 6.7 g/dL (ref 6.0–8.5)

## 2022-01-16 LAB — TSH: TSH: 3.23 u[IU]/mL (ref 0.450–4.500)

## 2022-01-16 NOTE — Assessment & Plan Note (Addendum)
Maintaining sinus rhythm on amiodarone.  Continue Eliquis 5 mg twice daily, amiodarone 2 mg daily.  Recent creatinine, hemoglobin normal.  Obtain follow-up LFTs, TSH.  Once cardiac mass is better defined, consider referral back to EP for arranging PVI ablation.

## 2022-01-16 NOTE — Patient Instructions (Signed)
Medication Instructions:  Your physician recommends that you continue on your current medications as directed. Please refer to the Current Medication list given to you today.  *If you need a refill on your cardiac medications before your next appointment, please call your pharmacy*  Lab Work: TODAY: TSH, LFTs If you have labs (blood work) drawn today and your tests are completely normal, you will receive your results only by: [place] (if you have MyChart) OR A paper copy in the mail If you have any lab test that is abnormal or we need to change your treatment, we will call you to review the results.  Testing/Procedures: NONE  Follow-Up: At [facility], you and your health needs are our priority.  As part of our continuing mission to provide you with exceptional heart care, we have created designated Provider Care Teams.  These Care Teams include your primary Cardiologist (physician) and Advanced Practice Providers (APPs -  Physician Assistants and Nurse Practitioners) who all work together to provide you with the care you need, when you need it.  Your next appointment:   3 month(s)  The format for your next appointment:   In Person  Provider:   Sinclair Grooms, MD  or Richardson Dopp, PA-C     Important Information About Sugar

## 2022-01-16 NOTE — Assessment & Plan Note (Signed)
No evidence of thrombosed RCA aneurysm on recent cardiac catheterization. PET scan pending.

## 2022-01-16 NOTE — Assessment & Plan Note (Signed)
EF 40-45 by echocardiogram in April 2023. NYHA IIb. Volume status appears stable. We discussed +/- SGLT2i. She has some issues w increased urinary frequency and incontinence. Therefore, will hold off on adding SGLT2i or MRA at this time.  Continue furosemide 20 mg as needed, metoprolol succinate 75 mg daily, Entresto 24/26 mg twice daily.  Follow-up with Dr. Tamala Julian in 3 months.

## 2022-01-16 NOTE — Assessment & Plan Note (Signed)
The patient's blood pressure is controlled on her current regimen.  Continue current therapy.   

## 2022-01-23 ENCOUNTER — Ambulatory Visit (HOSPITAL_COMMUNITY)
Admission: RE | Admit: 2022-01-23 | Discharge: 2022-01-23 | Disposition: A | Discharge status: Home / Self Care | Payer: No Typology Code available for payment source | Source: Ambulatory Visit | Attending: Cardiology | Admitting: Cardiology

## 2022-01-23 DIAGNOSIS — I5189 Other ill-defined heart diseases: Secondary | ICD-10-CM | POA: Diagnosis present

## 2022-01-23 LAB — GLUCOSE, CAPILLARY: Glucose-Capillary: 95 mg/dL (ref 70–99)

## 2022-01-23 MED ORDER — FLUDEOXYGLUCOSE F - 18 (FDG) INJECTION
13.4000 | Freq: Once | INTRAVENOUS | Status: AC
  Administered 2022-01-23: 13.4 via INTRAVENOUS

## 2022-02-03 IMAGING — DX DG ABDOMEN 1V
2 series · 2 of 2 positions shown · non-contrast
Comparison: MR abdomen 08/15/2016

CLINICAL DATA: Constipation, less likely bowel obstruction, lower
abdominal pain

EXAM:
ABDOMEN - 1 VIEW

[abdomen kub (1 of 2)]
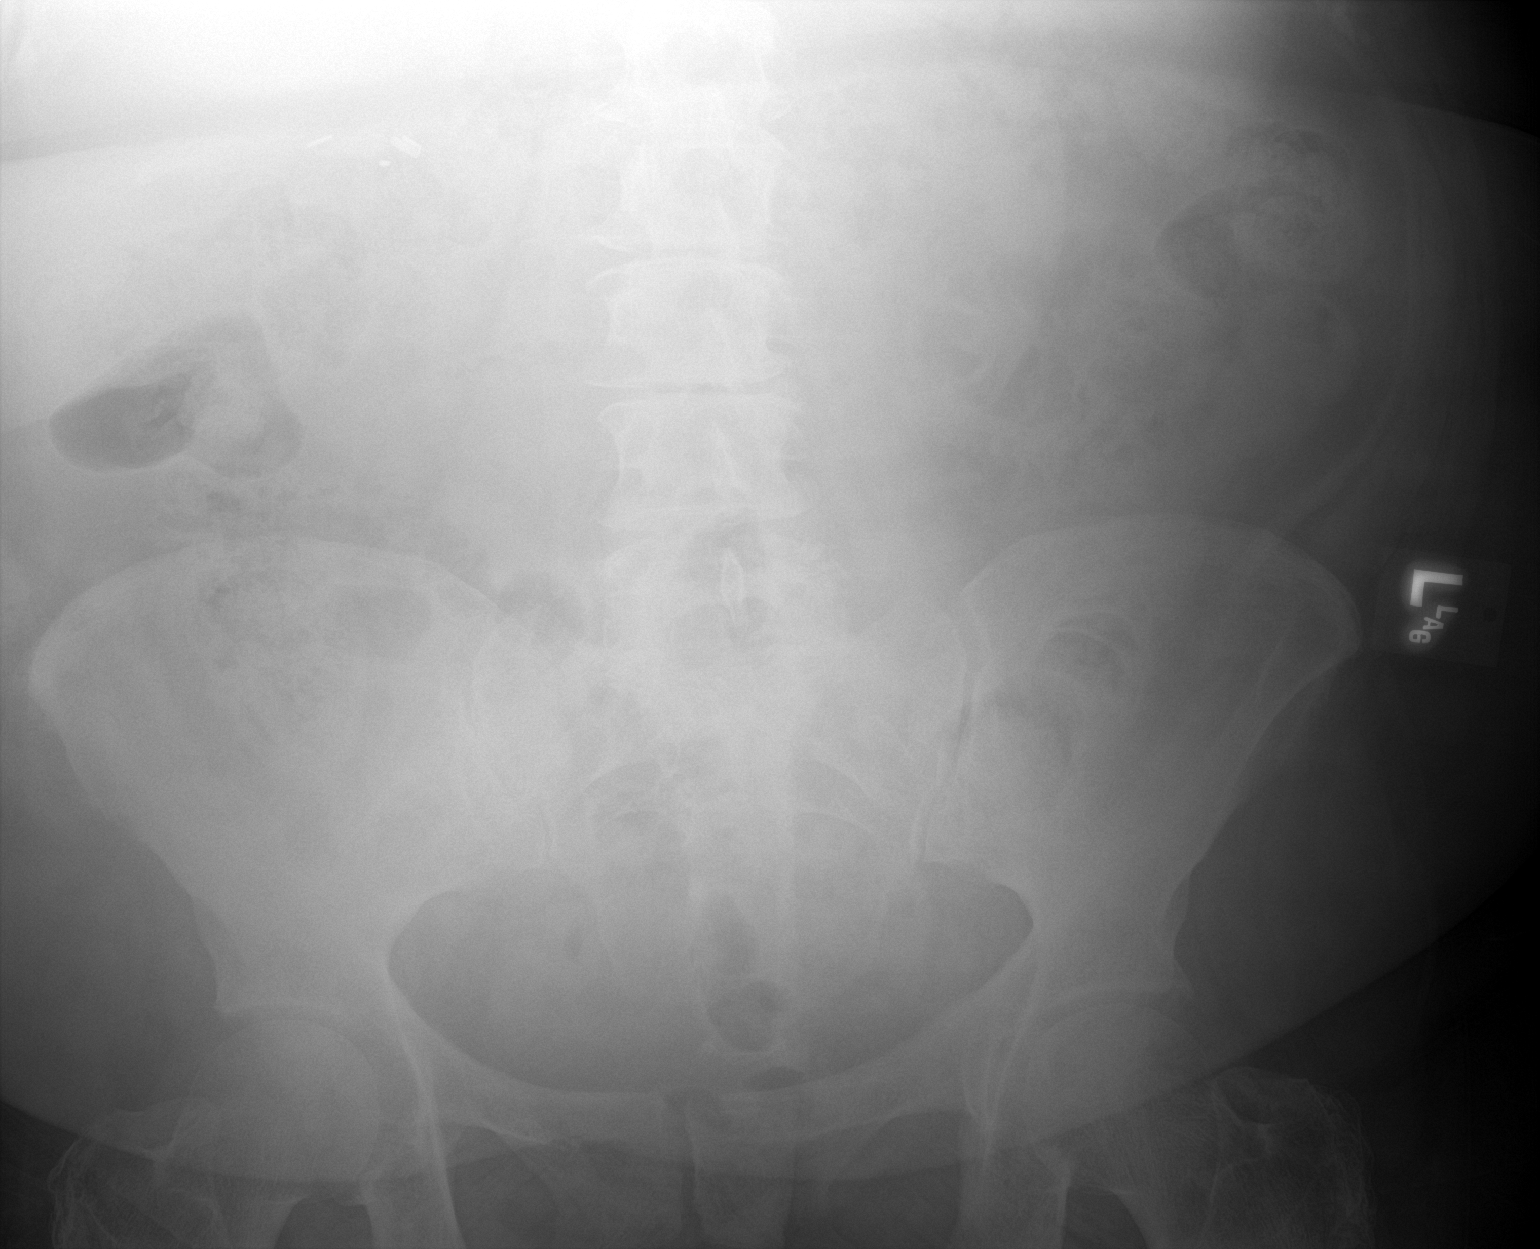

[abdomen kub (2 of 2)]
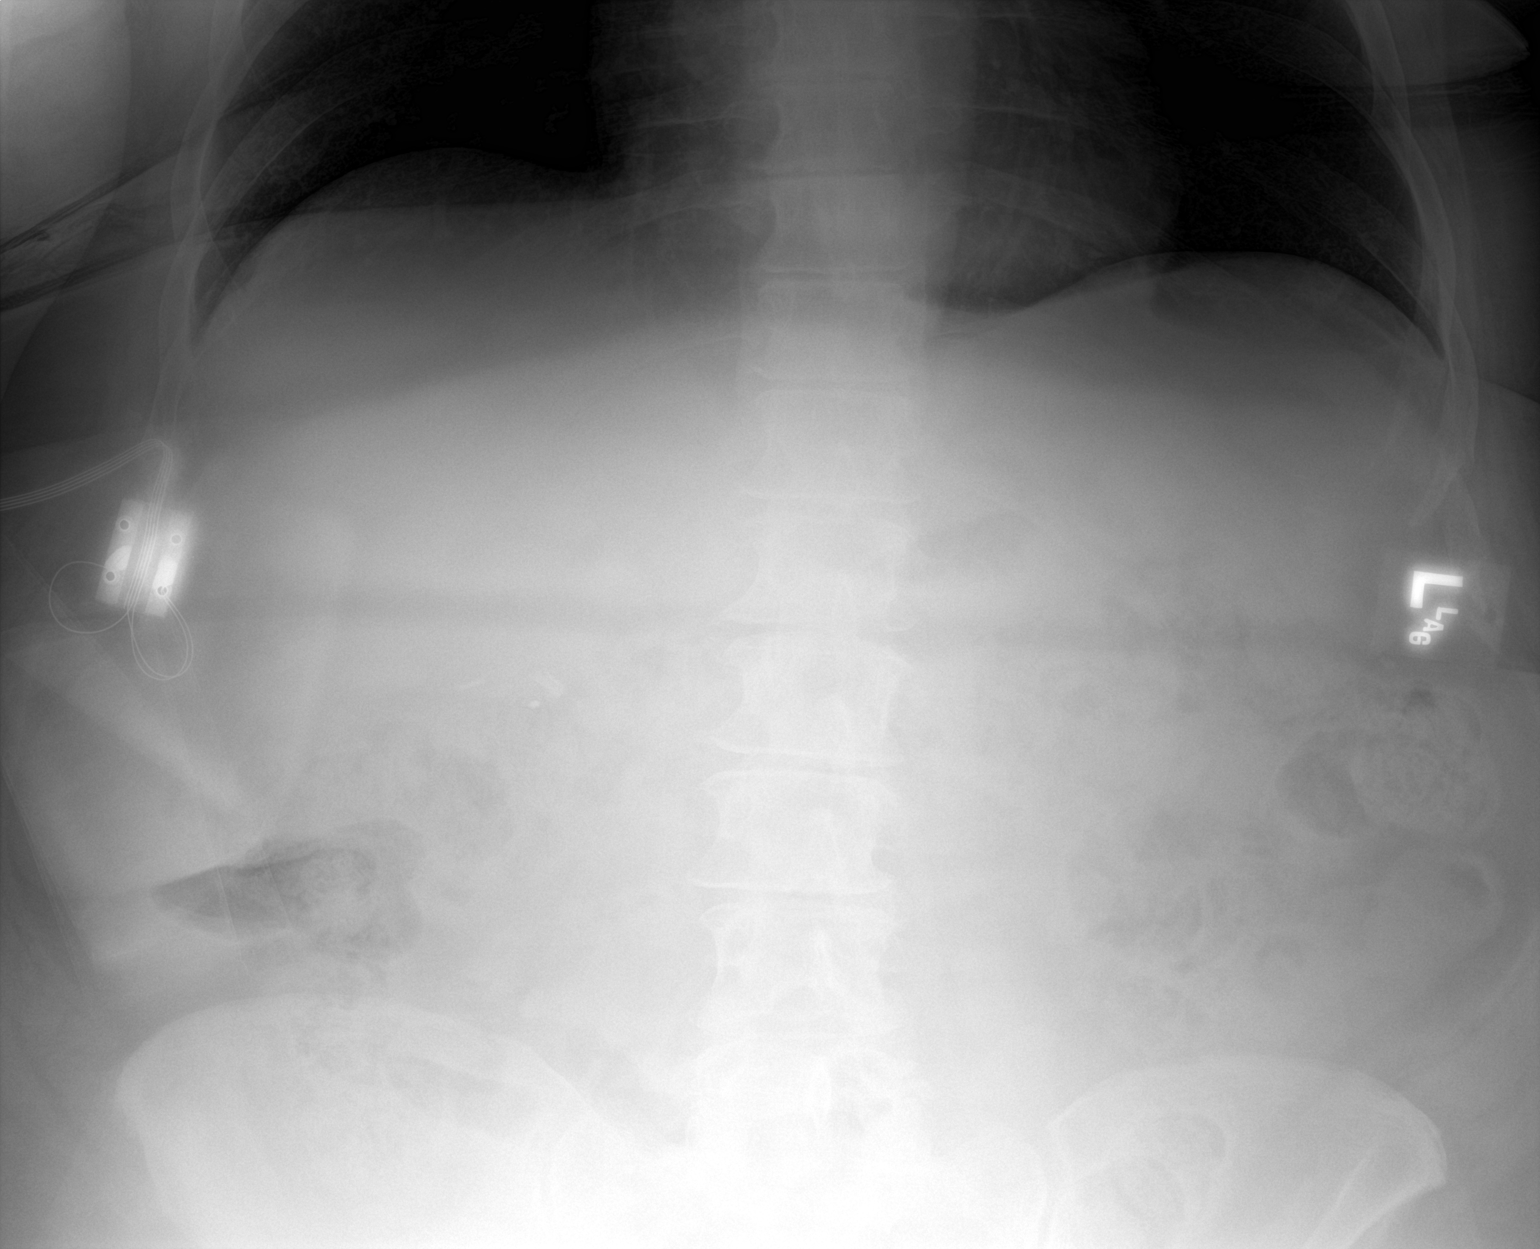

[2 of 2 positions shown; findings below may reference images not displayed]

FINDINGS: Evaluation is limited by body habitus. No high-grade obstructive
bowel gas pattern is seen. Moderate colonic stool burden.
Cholecystectomy clips in the right upper quadrant. No suspicious
abdominal calcifications. Degenerative changes in the spine, hips
and pelvis. Lung bases and included cardiomediastinal contours are
unremarkable.
IMPRESSION: Moderate colonic stool burden. No high-grade obstructive bowel gas
pattern.

## 2022-03-12 IMAGING — DX DG ABDOMEN ACUTE W/ 1V CHEST
4 series · 4 of 4 positions shown · non-contrast
Comparison: Chest x-ray 06/11/2020, CT abdomen pelvis 06/10/2020

CLINICAL DATA: Perforated abdominal viscus

EXAM:
DG ABDOMEN ACUTE WITH 1 VIEW CHEST

[chest ap]
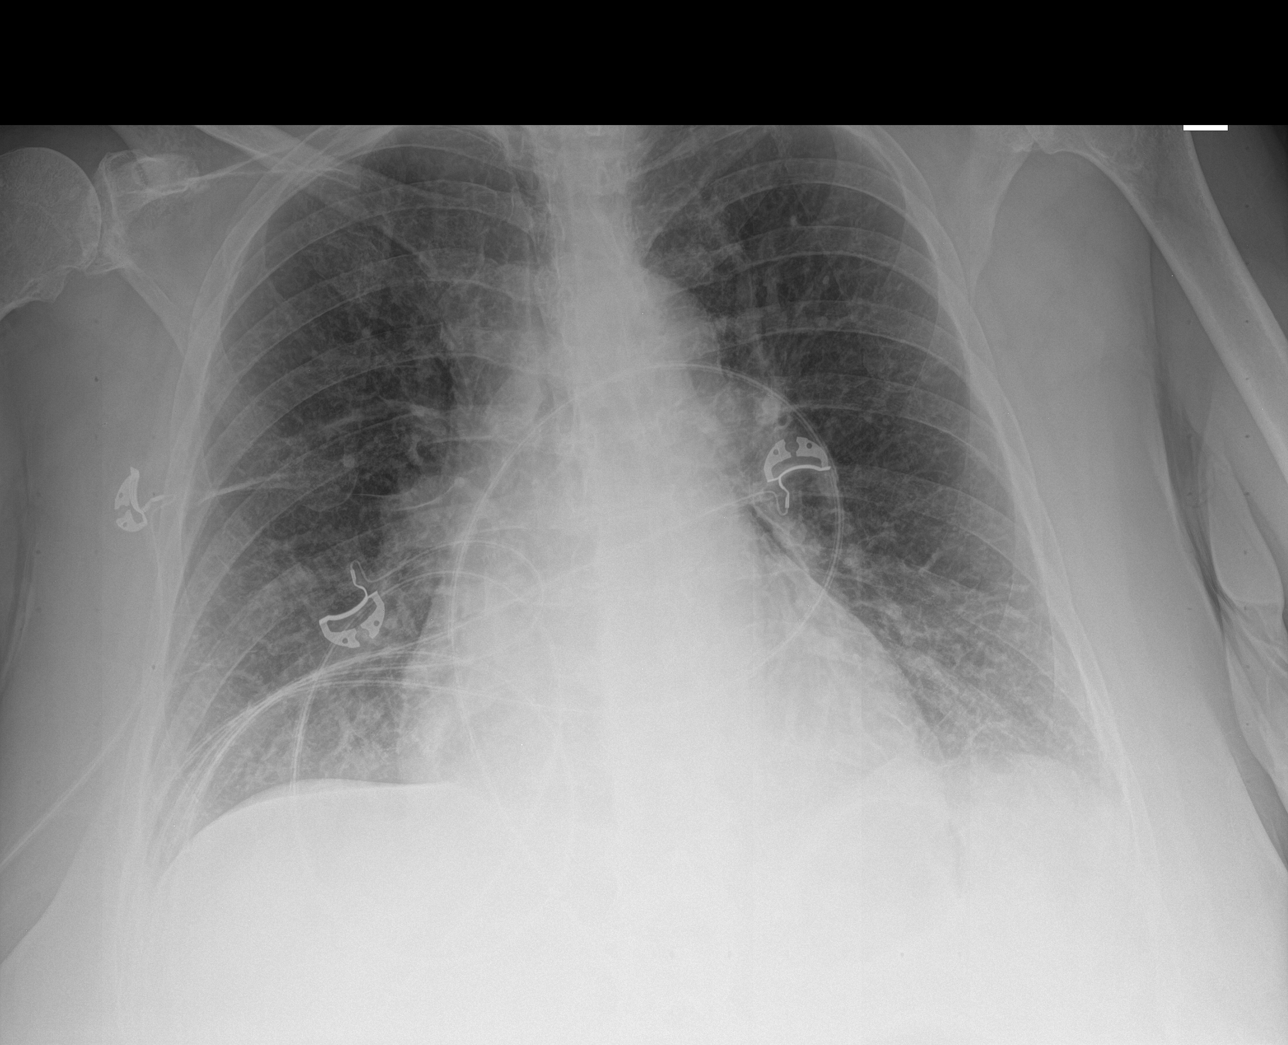

[abdomen erect]
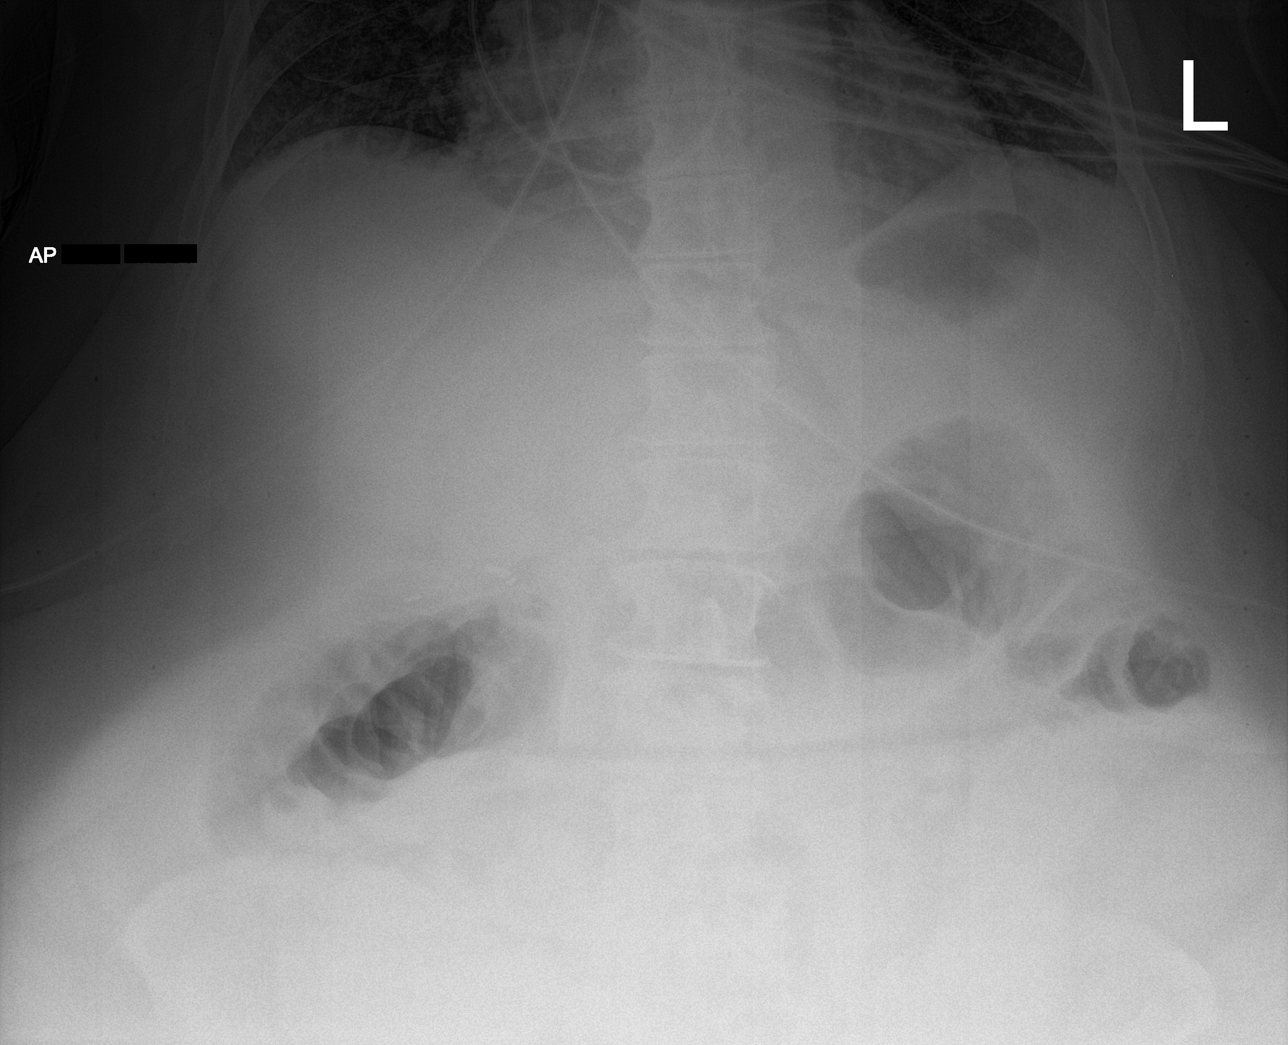

[abdomen supine (1 of 2)]
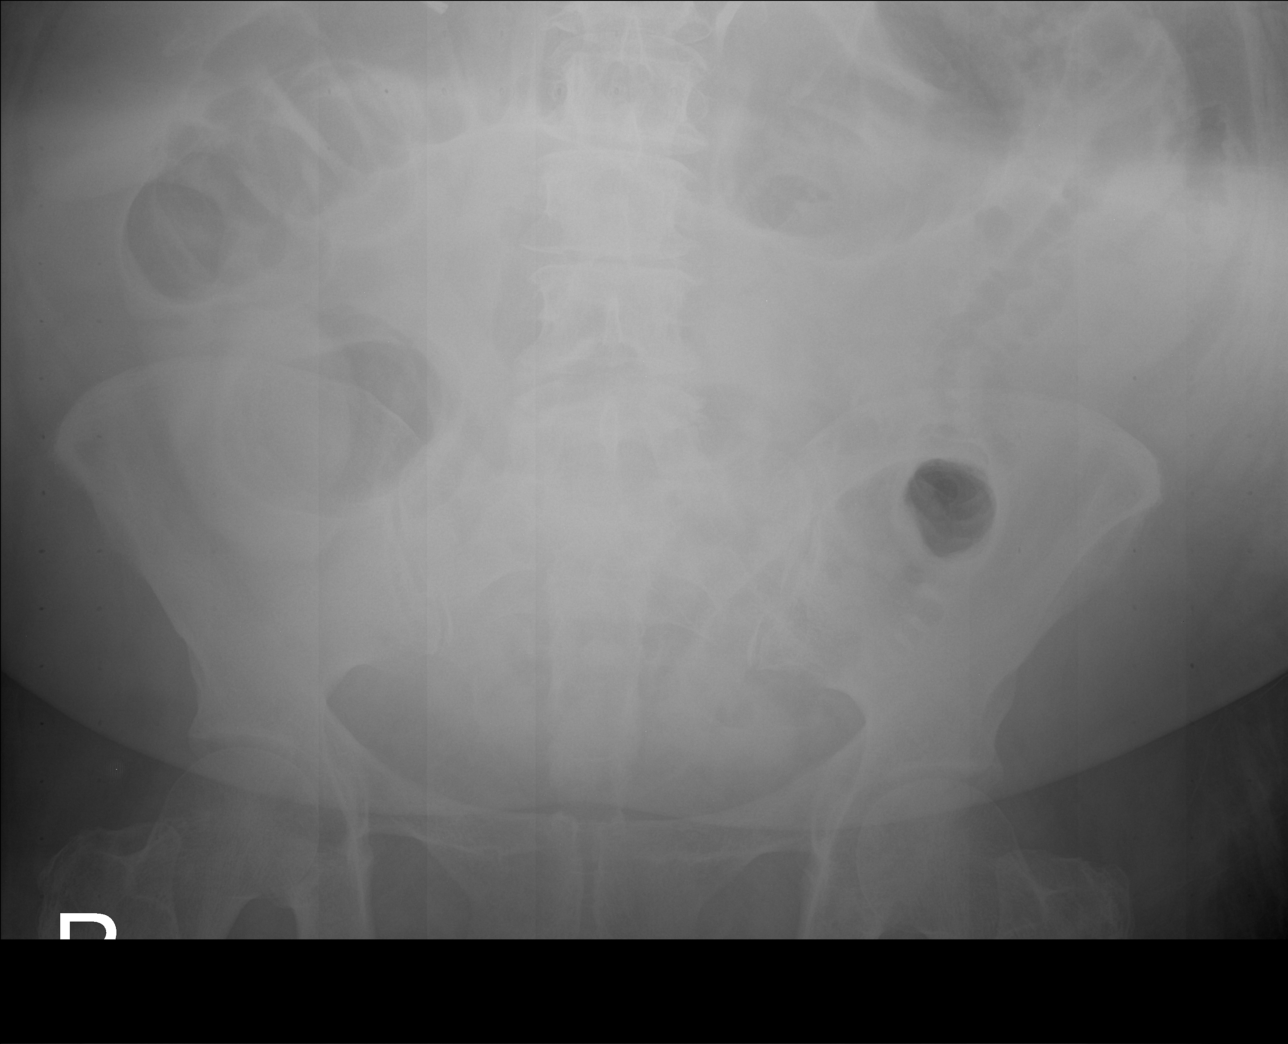

[abdomen supine (2 of 2)]
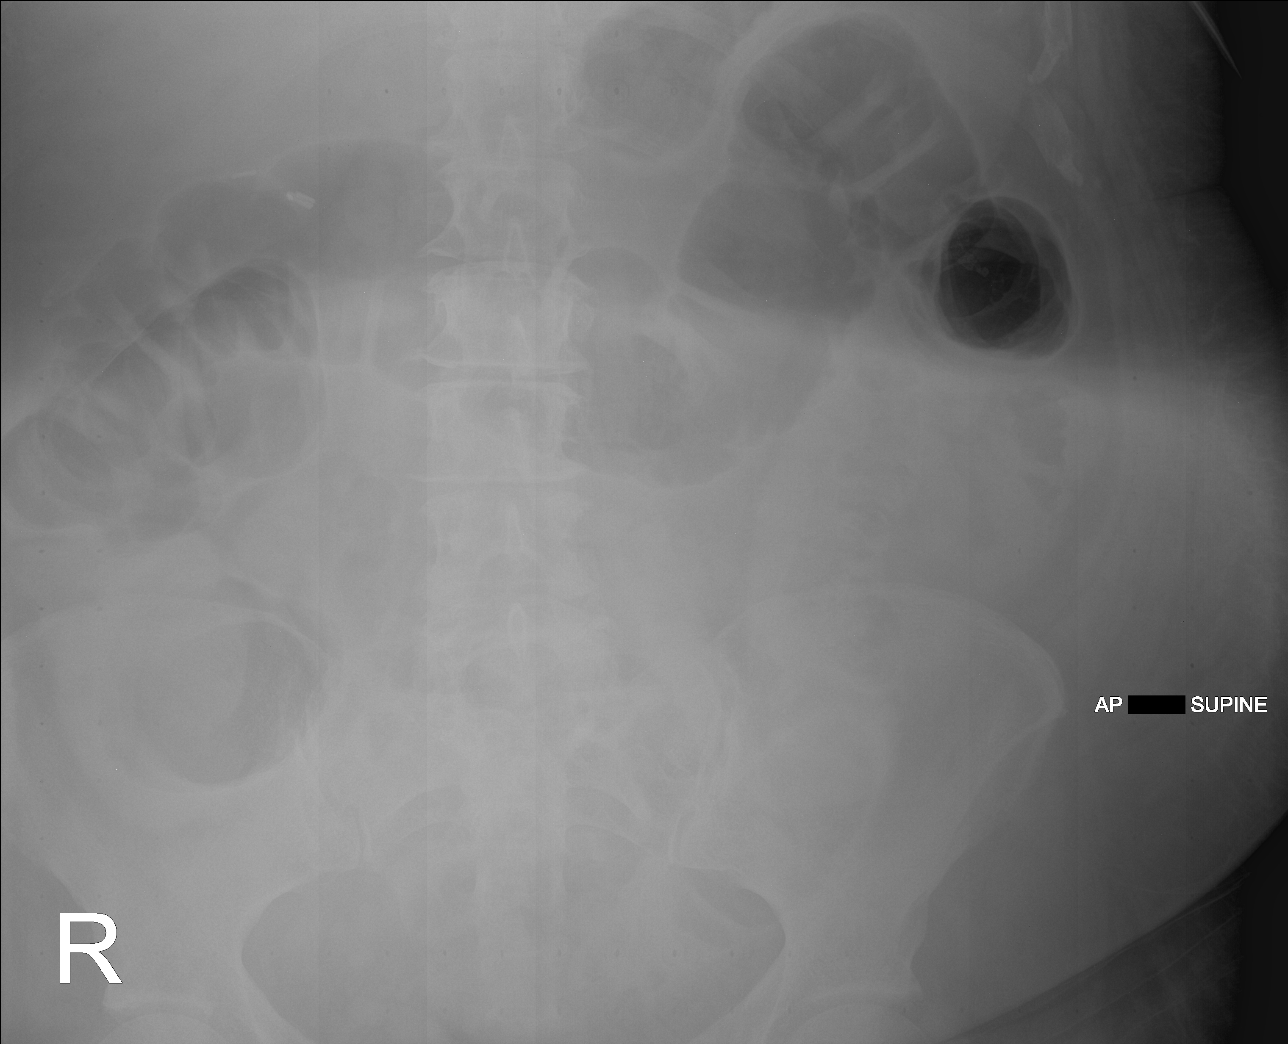

[4 of 4 positions shown; findings below may reference images not displayed]

FINDINGS: There is no evidence of dilated bowel loops or free intraperitoneal
air. No radiopaque calculi or other significant radiographic
abnormality is seen. Surgical clips overlie the right upper
quadrant.

The heart size and mediastinal contours are unchanged.

Bibasilar compressive changes. No definite focal consolidation. No
pulmonary edema. No pleural effusion. No pneumothorax.

No acute osseous abnormality.
IMPRESSION: Negative abdominal radiographs.  No acute cardiopulmonary disease.

## 2022-03-13 IMAGING — DX DG CHEST 1V PORT
1 series · 1 of 1 positions shown · non-contrast
Comparison: 06/11/2020

CLINICAL DATA: PICC line placement

EXAM:
PORTABLE CHEST 1 VIEW

[chest ap]
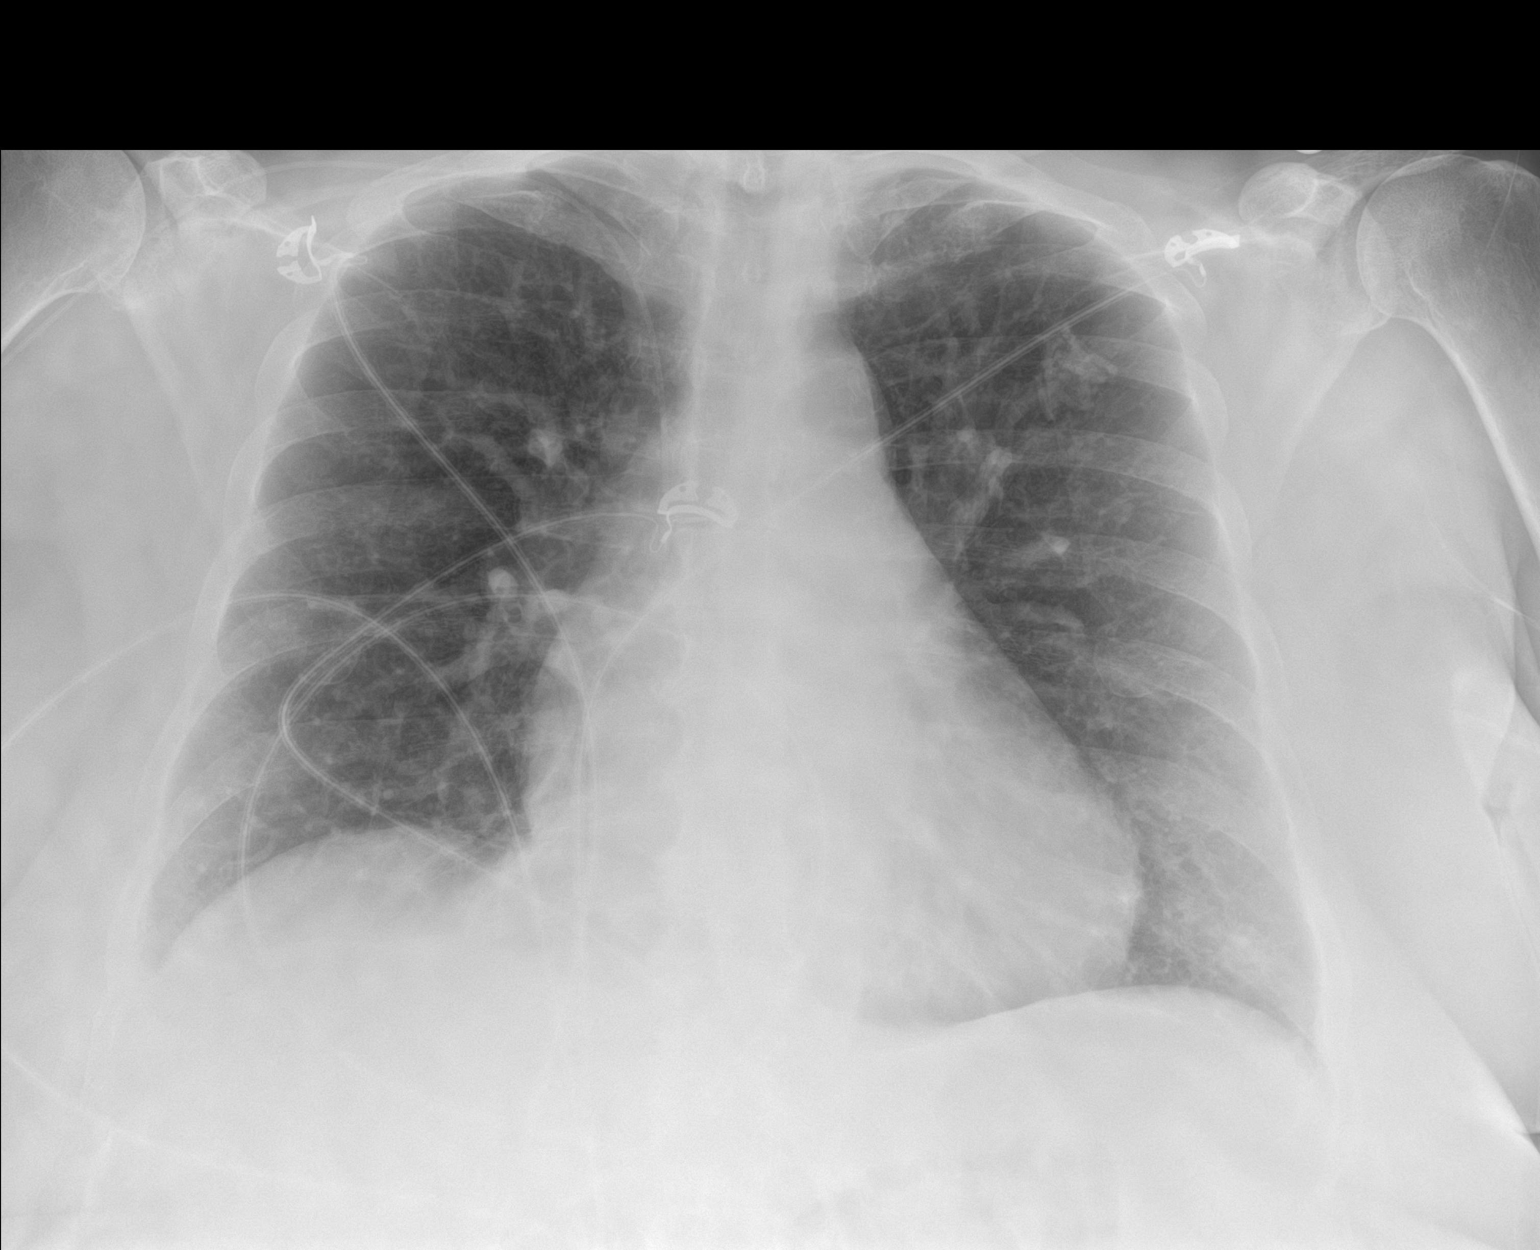

[1 of 1 positions shown; findings below may reference images not displayed]

FINDINGS: Normal cardiac silhouette. PICC line pace with tip in distal SVC.
Patchy airspace markings. No pneumothorax. No focal consolidation.
IMPRESSION: RIGHT PICC line placed without complication.

Patchy airspace opacities in lower lobes.

## 2022-03-24 ENCOUNTER — Ambulatory Visit (HOSPITAL_COMMUNITY)
Admission: RE | Admit: 2022-03-24 | Discharge: 2022-03-24 | Disposition: A | Discharge status: Home / Self Care | Payer: No Typology Code available for payment source | Source: Ambulatory Visit | Attending: Nurse Practitioner | Admitting: Nurse Practitioner

## 2022-03-24 DIAGNOSIS — K9400 Colostomy complication, unspecified: Secondary | ICD-10-CM

## 2022-03-24 DIAGNOSIS — Z933 Colostomy status: Secondary | ICD-10-CM | POA: Diagnosis present

## 2022-03-24 DIAGNOSIS — L24B3 Irritant contact dermatitis related to fecal or urinary stoma or fistula: Secondary | ICD-10-CM

## 2022-03-24 NOTE — Progress Notes (Signed)
Tammy Davies   Reason for visit:  LLQ colostomy.  Recent change in stool consistency  HPI:  Diverticulitis with LLQ end colostomy ROS  Review of Systems Vital signs:  BP (!) 141/64   Pulse 60   Temp 98.8 F (37.1 C) (Oral)   Resp 17   SpO2 96%  Exam:  Physical Exam  Stoma type/location:  LLQ colostomy Stomal assessment/size:  1 3/8" slightly budded Peristomal assessment:  contact dermatitis to peristomal skin   Rounded abdomen.  Chooses flat pouch as convex was not comfortable for her.  Treatment options for stomal/peristomal skin: *** Output: *** Ostomy pouching: 1pc./2pc.  Education provided:  ***    Impression/dx  *** Discussion  *** Plan  ***    Visit time: *** minutes.   Domenic Moras FNP-BC

## 2022-03-25 DIAGNOSIS — K9400 Colostomy complication, unspecified: Secondary | ICD-10-CM | POA: Insufficient documentation

## 2022-03-25 DIAGNOSIS — L24B3 Irritant contact dermatitis related to fecal or urinary stoma or fistula: Secondary | ICD-10-CM | POA: Insufficient documentation

## 2022-03-30 ENCOUNTER — Other Ambulatory Visit (HOSPITAL_COMMUNITY): Payer: Self-pay | Admitting: Nurse Practitioner

## 2022-03-30 DIAGNOSIS — L24B3 Irritant contact dermatitis related to fecal or urinary stoma or fistula: Secondary | ICD-10-CM

## 2022-03-30 DIAGNOSIS — K9400 Colostomy complication, unspecified: Secondary | ICD-10-CM

## 2022-04-01 ENCOUNTER — Encounter (HOSPITAL_COMMUNITY): Payer: Self-pay | Admitting: Nurse Practitioner

## 2022-04-12 ENCOUNTER — Other Ambulatory Visit: Payer: Self-pay | Admitting: Cardiology

## 2022-04-20 NOTE — Progress Notes (Unsigned)
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 female with a hx of   Prior message from Dr. Gardiner Rhyme, 01/30/2022: "This is a patient of yours who was incidentally on preablation CT scan noted to have a mass around her RCA origin.  We were suspecting likely a thrombosed coronary aneurysm but cath did not show anything.  We just did a PET scan on her, which was unremarkable.  I think this probably is a thrombosed aneurysm or pseudoaneurysm, but we just do not see any flow into it on the cath because it is clotted off.  Looks like she has follow-up with you in December.  Not sure the best way to proceed-may be just repeating a CT scan in 6 months or so and make sure it is not enlarging?  If it is enlarging, could refer to surgery but if stable maybe we just monitor? "   She feels great.  We discussed the finding of the likely proximal fusiform coronary aneurysm noted on preablation heart CT.  Coronary angiography did not identify an aneurysm at all.  PET scan demonstrated the same finding as was seen on coronary CT.  There was no increased activity in the aneurysm to suggest that this was a tumor.  Paroxysmal atrial fibrillation/persistent atrial fibrillation have been a significant issue until amiodarone was started.  Amiodarone has led to reasonably good rhythm control.  Physical activity has improved.  She denies dyspnea.  She has not had syncope.  At around side effects that she can tell.  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) Echocardiogram 1/19: EF 60-65   History of COVID-19    Hypertension 06/08/2017   OSA (obstructive sleep apnea)    Persistent atrial fibrillation ([place])    Persistent since admx w diverticulitis in 1/22  //  failed DCCV in 3/22  //  Dofetilide load in 1/23 >> failed DCCV x 2   Tobacco abuse     Past Surgical History:  Procedure Laterality Date   ABDOMINAL HYSTERECTOMY     CARDIOVERSION N/A 07/22/2020   Procedure: CARDIOVERSION;  Surgeon: [name], MD;  Location: [facility] ENDOSCOPY;  Service: Cardiovascular;  Laterality: N/A;   CARDIOVERSION N/A 05/28/2021   Procedure: CARDIOVERSION;  Surgeon: Geralynn Rile, MD;  Location: [place];  Service: Cardiovascular;  Laterality: N/A;   CARDIOVERSION N/A 06/13/2021   Procedure: CARDIOVERSION;  Surgeon: Berniece Salines, DO;  Location: [place] ENDOSCOPY;  Service: Cardiovascular;  Laterality: N/A;   CARDIOVERSION N/A 07/15/2021   Procedure: CARDIOVERSION;  Surgeon: Vickie Epley, MD;  Location: [place] CV LAB;  Service: Cardiovascular;  Laterality: N/A;   CHOLECYSTECTOMY N/A  08/18/2016   Procedure: LAPAROSCOPIC CHOLECYSTECTOMY WITH  INTRAOPERATIVE CHOLANGIOGRAM;  Surgeon: Johnathan Hausen, MD;  Location: [facility] ORS;  Service: General;  Laterality: N/A;   COLECTOMY WITH COLOSTOMY CREATION/HARTMANN PROCEDURE N/A 06/18/2020   Procedure: SIGMOID COLECTOMY WITH COLOSTOMY CREATION; DRAINAGE OF PELVIC ABSCESS;  Surgeon: Rolm Bookbinder, MD;  Location: [place];  Service: General;  Laterality: N/A;   CYSTOSCOPY WITH STENT PLACEMENT Bilateral 06/18/2020   Procedure: CYSTOSCOPY WITH STENT PLACEMENT;  Surgeon: Alexis Frock, MD;  Location: [place];   Service: Urology;  Laterality: Bilateral;   LAPAROTOMY N/A 06/18/2020   Procedure: EXPLORATORY LAPAROTOMY;  Surgeon: Rolm Bookbinder, MD;  Location: [place];  Service: General;  Laterality: N/A;   LEFT HEART CATH AND CORONARY ANGIOGRAPHY N/A 12/25/2021   Procedure: LEFT HEART CATH AND CORONARY ANGIOGRAPHY;  Surgeon: Belva Crome, MD;  Location: [place] CV LAB;  Service: Cardiovascular;  Laterality: N/A;    Current Medications: Current Meds  Medication Sig   acetaminophen (TYLENOL) 500 MG tablet Take 1,000 mg by mouth every 6 (six) hours as needed for mild pain, fever or headache.   amiodarone (PACERONE) 200 MG tablet Take 1 tablet by mouth daily.   apixaban (ELIQUIS) 5 MG TABS tablet Take 1 tablet (5 mg total) by mouth 2 (two) times daily.   furosemide (LASIX) 20 MG tablet Take 20 mg by mouth as needed for fluid or edema.   Magnesium 250 MG TABS Take 250 mg by mouth daily.   metoprolol succinate (TOPROL-XL) 25 MG 24 hr tablet TAKE 3 TABLETS BY MOUTH ONCE DAILY **TAKE  WITH  OR  IMMEDIATELY  FOLLOWING  A  MEAL**   metoprolol tartrate (LOPRESSOR) 25 MG tablet Take 1 tablet (25 mg total) by mouth once for 1 dose. Take 90-120 minutes prior to scan.   potassium chloride (KLOR-CON) 10 MEQ tablet Take 1 tablet by mouth once daily   sacubitril-valsartan (ENTRESTO) 24-26 MG Take 1 tablet by mouth twice daily     Allergies:   Heparin   Social History   Socioeconomic History   Marital status: Single    Spouse name: Not on file   Number of children: Not on file   Years of education: Not on file   Highest education level: Not on file  Occupational History   Not on file  Tobacco Use   Smoking status: Every Day    Types: Cigarettes    Start date: 83   Smokeless tobacco: Never   Tobacco comments:    A Pack every 2 weeks 08/05/21  Vaping Use   Vaping Use: Never used  Substance and Sexual Activity   Alcohol use: No   Drug use: Yes    Types: Marijuana    Comment: last used Christmas    Sexual activity: Never  Other Topics Concern   Not on file  Social History Narrative   Not on file   Social Determinants of Health   Financial Resource Strain: Not on file  Food Insecurity: Not on file  Transportation Needs: Not on file  Physical Activity: Not on file  Stress: Not on file  Social Connections: Not on file     Family History: The patient's family history includes Atrial fibrillation in her father and mother; Breast cancer in her mother; Diabetes in her father; Heart failure in her father and mother. There is no history of Colon cancer, Stomach cancer, Esophageal cancer, Pancreatic cancer, or Liver disease.  ROS:   Please see the history of present illness.  She has not had bleeding on Eliquis.  All other systems reviewed and are negative.  EKGs/Labs/Other Studies Reviewed:    The following studies were reviewed today:  ECHOCARDIOGRAM 08/2021: IMPRESSIONS   1. Left ventricular ejection fraction, by estimation, is 40 to 45%. The  left ventricle has mildly decreased function. The left ventricle  demonstrates global hypokinesis. The left ventricular internal cavity size  was moderately dilated. Left ventricular  diastolic parameters are consistent with Grade I diastolic dysfunction  (impaired relaxation).   2. Right ventricular systolic function is normal. The right ventricular  size is normal. Tricuspid regurgitation signal is inadequate for assessing  PA pressure.   3. Left atrial size was mildly dilated.   4. The mitral valve is normal in structure. Trivial mitral valve  regurgitation.   5. The aortic valve is tricuspid. There is mild thickening of the aortic  valve. Aortic valve regurgitation is not visualized. Aortic valve  sclerosis is present, with no evidence of aortic valve stenosis.   6. Aortic dilatation noted. There is borderline dilatation of the aortic  root, measuring 38 mm.   7. The inferior vena cava is normal in size with greater than 50%   respiratory variability, suggesting right atrial pressure of 3 mmHg.   Comparison(s): Compared to prior TTE in 07/2021, there is no significant  change.    CT CARDIAC MORPH/PULM VEIN W/CM&W/O CA SCORE 9/08//2023 IMPRESSION: 1. 2.2 x 1.8 cm mass adjacent to the right coronary ostium with evidence of vascularity. Indeterminate by this exam, recommend Cardiac MRI for further tissue characterization. Differential includes thrombosed coronary aneurysm, saccular thrombosed aortic aneurysm, or mediastinal soft tissue mass.   2. There is anatomically normal pulmonary vein drainage into the left atrium. Narrowing of the left lower pulmonary vein which appears compressed between the left atrium and aorta.   3. Normal left atrial appendage, no left atrial appendage thrombus.   4. The esophagus runs in the left atrial midline and is not in the proximity to any of the pulmonary veins.   5. Mild dilation of main pulmonary artery, 33 mm, may suggest pulmonary hypertension.   Findings discussed with Dr. Quentin Ore.   CARDIAC CATH 12/25/2021: CONCLUSIONS: Normal right coronary with no connection to a mass or evidence of tumor blush. Normal left main Normal large circumflex LAD is relatively small and has diffuse atherosclerosis but no high-grade obstruction. Mild global hypokinesis with EF 45%.  EDP 13 mmHg   RECOMMENDATIONS: Discussed with Dr. Gardiner Rhyme.  He will proceed with PET scanning now thinking that the mass could be soft tissue. Home later today.  Okay to start apixaban later tonight or preferably in a.m.    EKG:  EKG not performed today  Recent Labs: 08/02/2021: B Natriuretic Peptide 220.9 08/03/2021: Magnesium 1.9 12/18/2021: BUN 12; Creatinine, Ser 1.00; Hemoglobin 14.1; Platelets 210; Potassium 4.0; Sodium 139 01/16/2022: ALT 21; TSH 3.230  Recent Lipid Panel    Component Value Date/Time   CHOL 173 06/08/2017 0819   TRIG 124 06/22/2020 0935   HDL 40 (L) 06/08/2017 0819    CHOLHDL 4.3 06/08/2017 0819   VLDL 23 06/08/2017 0819   LDLCALC 110 (H) 06/08/2017 0819    Physical Exam:    VS:  BP 116/80   Pulse (!) 56   Ht 5' 8"  (1.727 m)   Wt 262 lb 6.4 oz (119 kg)   SpO2 97%   BMI 39.90 kg/m     Wt Readings from Last 3 Encounters:  04/21/22 262 lb 6.4  oz (119 kg)  01/16/22 268 lb 12.8 oz (121.9 kg)  12/25/21 268 lb 6.4 oz (121.7 kg)     GEN: Overweight with BMI 39. No acute distress HEENT: Normal NECK: No JVD. LYMPHATICS: No lymphadenopathy CARDIAC: No murmur. RRR no gallop, or edema. VASCULAR:  Normal Pulses. No bruits. RESPIRATORY:  Clear to auscultation without rales, wheezing or rhonchi  ABDOMEN: Soft, non-tender, non-distended, No pulsatile mass, MUSCULOSKELETAL: No deformity  SKIN: Warm and dry NEUROLOGIC:  Alert and oriented x 3 PSYCHIATRIC:  Normal affect   ASSESSMENT:    1. Coronary artery aneurysm   2. Chronic systolic heart failure (HCC)   3. Persistent atrial fibrillation ([place])   4. Primary hypertension   5. Secondary hypercoagulable state ([place])   6. OSA (obstructive sleep apnea)   7. Abnormal CT scan of heart    PLAN:    In order of problems listed above:  Suspected thrombosed aneurysm in the proximal part of the right coronary demonstrated on coronary CTA.  Will plan to repeat coronary CTA in May to reassess size and determine if it is showing evidence of growing.  Management strategy for a right coronary aneurysm would be a covered stent.  Vascular imaging might be helpful at some point.  If the CT shows stable size, I am not sure anything further needs to be done other than occasional surveillance to ensure absence of growth. Continue Toprol-XL and Entresto.  Will need to have repeat echocardiogram at some point in 2024. Ablation was put on the back burner after the coronary aneurysm was identified.  She needs to get back with EP to determine if long-term amiodarone therapy is the best treatment option for atrial fibrillation.   TSH and liver panel are ordered today. Excellent blood pressure noted on Entresto, Toprol, and furosemide. Continue Eliquis.  Monitor for bleeding. CPAP encouraged. We discussed the side effect profile of amiodarone and also the new recent large retrospective trial of chronic amiodarone therapy as it relates to complications.  That trial indicated that 200 mg a less was associated with relatively few significant side effects.  She will establish with Dr. Nechama Guard as her primary cardiologist going forward after my retirement. We will get blood work today to monitor amiodarone therapy. She will have a follow-up with Dr. Quentin Ore to decide about whether further management decisions need to be made concerning A-fib.   Medication Adjustments/Labs and Tests Ordered: Current medicines are reviewed at length with the patient today.  Concerns regarding medicines are outlined above.  Orders Placed This Encounter  Procedures   CT CORONARY MORPH W/CTA COR W/SCORE W/CA W/CM &/OR WO/CM   Meds ordered this encounter  Medications   metoprolol tartrate (LOPRESSOR) 25 MG tablet    Sig: Take 1 tablet (25 mg total) by mouth once for 1 dose. Take 90-120 minutes prior to scan.    Dispense:  1 tablet    Refill:  0    Patient Instructions  Medication Instructions:  Your physician recommends that you continue on your current medications as directed. Please refer to the Current Medication list given to you today.  *If you need a refill on your cardiac medications before your next appointment, please call your pharmacy*  Lab Work: TODAY: TSH and Liver panel If you have labs (blood work) drawn today and your tests are completely normal, you will receive your results only by: [place] (if you have MyChart) OR A paper copy in the mail If you have any lab test that is  abnormal or we need to change your treatment, we will call you to review the results.  Testing/Procedures: Your physician requests that  you have a coronary CTA performed in May 2024. Please see instructions for this CT scan below under Other Instructions.  Follow-Up: At [facility], you and your health needs are our priority.  As part of our continuing mission to provide you with exceptional heart care, we have created designated Provider Care Teams.  These Care Teams include your primary Cardiologist (physician) and Advanced Practice Providers (APPs -  Physician Assistants and Nurse Practitioners) who all work together to provide you with the care you need, when you need it.  Your next appointment:   3-4 months with Lars Mage, MD 6-7 month(s) with Gayla Doss, MD  The format for your next appointment:   In Person  Provider:   Gayla Doss, MD Other Instructions   Your cardiac CT will be scheduled at:   [facility] [address] [place], [place] [address] [phone]  Please arrive at the [facility] and Children's Entrance (Entrance C2) of [facility] 30 minutes prior to test start time. You can use the FREE valet parking offered at entrance C (encouraged to control the heart rate for the test)  Proceed to the [facility] Radiology Department (first floor) to check-in and test prep.  All radiology patients and guests should use entrance C2 at [facility], accessed from [facility], even though the hospital's physical address listed is [address]    Please follow these instructions carefully (unless otherwise directed):  On the Night Before the Test: Be sure to Drink plenty of water. Do not consume any caffeinated/decaffeinated beverages or chocolate 12 hours prior to your test. Do not take any antihistamines 12 hours prior to your test.  On the Day of the Test: Drink plenty of water until 1 hour prior to the test. Do not eat any food 1 hour prior to test. You may take your regular medications prior to the test.  Take metoprolol  (Lopressor) 91m two hours prior to test.This was sent to your pharmacy, [facility]  HOLD Furosemide (Lasix) morning of the test. FEMALES- please wear underwire-free bra if available, avoid dresses & tight clothing  After the Test: Drink plenty of water. After receiving IV contrast, you may experience a mild flushed feeling. This is normal. On occasion, you may experience a mild rash up to 24 hours after the test. This is not dangerous. If this occurs, you can take Benadryl 25 mg and increase your fluid intake. If you experience trouble breathing, this can be serious. If it is severe call 911 IMMEDIATELY. If it is mild, please call our office. If you take any of these medications: Glipizide/Metformin, Avandament, Glucavance, please do not take 48 hours after completing test unless otherwise instructed.  We will call to schedule your test 2-4 weeks out understanding that some insurance companies will need an authorization prior to the service being performed.   For non-scheduling related questions, please contact the cardiac imaging nurse navigator should you have any questions/concerns: SMarchia Bond Cardiac Imaging Nurse Navigator MGordy Clement Cardiac Imaging Nurse Navigator [facility] Direct Office Dial: [phone]  For scheduling needs, including cancellations and rescheduling, please call [phone]   Important Information About Sugar         Signed, HSinclair Grooms MD  04/21/2022 9:00 AM    [facility]

## 2022-04-21 ENCOUNTER — Encounter: Payer: Self-pay | Admitting: Interventional Cardiology

## 2022-04-21 ENCOUNTER — Ambulatory Visit
Payer: No Typology Code available for payment source | Attending: Interventional Cardiology | Admitting: Interventional Cardiology

## 2022-04-21 VITALS — BP 116/80 | HR 56 | Ht 68.0 in | Wt 262.4 lb

## 2022-04-21 DIAGNOSIS — I4819 Other persistent atrial fibrillation: Secondary | ICD-10-CM | POA: Diagnosis not present

## 2022-04-21 DIAGNOSIS — G4733 Obstructive sleep apnea (adult) (pediatric): Secondary | ICD-10-CM

## 2022-04-21 DIAGNOSIS — D6869 Other thrombophilia: Secondary | ICD-10-CM

## 2022-04-21 DIAGNOSIS — I5022 Chronic systolic (congestive) heart failure: Secondary | ICD-10-CM

## 2022-04-21 DIAGNOSIS — R931 Abnormal findings on diagnostic imaging of heart and coronary circulation: Secondary | ICD-10-CM

## 2022-04-21 DIAGNOSIS — I10 Essential (primary) hypertension: Secondary | ICD-10-CM | POA: Diagnosis not present

## 2022-04-21 DIAGNOSIS — I2541 Coronary artery aneurysm: Secondary | ICD-10-CM

## 2022-04-21 DIAGNOSIS — Z79899 Other long term (current) drug therapy: Secondary | ICD-10-CM

## 2022-04-21 DIAGNOSIS — Z01812 Encounter for preprocedural laboratory examination: Secondary | ICD-10-CM

## 2022-04-21 MED ORDER — METOPROLOL TARTRATE 25 MG PO TABS: 25.0000 mg | ORAL_TABLET | Freq: Once | ORAL | 0 refills | Status: DC

## 2022-04-21 NOTE — Patient Instructions (Addendum)
Medication Instructions:  Your physician recommends that you continue on your current medications as directed. Please refer to the Current Medication list given to you today.  *If you need a refill on your cardiac medications before your next appointment, please call your pharmacy*  Lab Work: TODAY: TSH and Liver panel End of April/early May 2024: BMET (for coronary CTA) If you have labs (blood work) drawn today and your tests are completely normal, you will receive your results only by: [place] (if you have MyChart) OR A paper copy in the mail If you have any lab test that is abnormal or we need to change your treatment, we will call you to review the results.  Testing/Procedures: Your physician requests that you have a coronary CTA performed in May 2024. You will need to have blood work to check your kidney function prior to the CT scan. Please see instructions for this CT scan below under Other Instructions.  Follow-Up: At [facility], you and your health needs are our priority.  As part of our continuing mission to provide you with exceptional heart care, we have created designated Provider Care Teams.  These Care Teams include your primary Cardiologist (physician) and Advanced Practice Providers (APPs -  Physician Assistants and Nurse Practitioners) who all work together to provide you with the care you need, when you need it.  Your next appointment:   3-4 months with Lars Mage, MD 6-7 month(s) with Gayla Doss, MD  The format for your next appointment:   In Person  Provider:   Gayla Doss, MD Other Instructions   Your cardiac CT will be scheduled at:   [facility] [address] [place], [place] [address] [phone]  Please arrive at the [facility] and Children's Entrance (Entrance C2) of [facility] 30 minutes prior to test start time. You can use the FREE valet parking offered at entrance C (encouraged to control the  heart rate for the test)  Proceed to the [facility] Radiology Department (first floor) to check-in and test prep.  All radiology patients and guests should use entrance C2 at [facility], accessed from [facility], even though the hospital's physical address listed is [address].    Please follow these instructions carefully (unless otherwise directed):  On the Night Before the Test: Be sure to Drink plenty of water. Do not consume any caffeinated/decaffeinated beverages or chocolate 12 hours prior to your test. Do not take any antihistamines 12 hours prior to your test.  On the Day of the Test: Drink plenty of water until 1 hour prior to the test. Do not eat any food 1 hour prior to test. You may take your regular medications prior to the test.  Take metoprolol (Lopressor) 57m two hours prior to test.This was sent to your pharmacy, [facility]  HOLD Furosemide (Lasix) morning of the test. FEMALES- please wear underwire-free bra if available, avoid dresses & tight clothing  After the Test: Drink plenty of water. After receiving IV contrast, you may experience a mild flushed feeling. This is normal. On occasion, you may experience a mild rash up to 24 hours after the test. This is not dangerous. If this occurs, you can take Benadryl 25 mg and increase your fluid intake. If you experience trouble breathing, this can be serious. If it is severe call 911 IMMEDIATELY. If it is mild, please call our office. If you take any of these medications: Glipizide/Metformin, Avandament, Glucavance, please do  not take 48 hours after completing test unless otherwise instructed.  We will call to schedule your test 2-4 weeks out understanding that some insurance companies will need an authorization prior to the service being performed.   For non-scheduling related questions, please contact the cardiac imaging nurse navigator should you have any  questions/concerns: Marchia Bond, Cardiac Imaging Nurse Navigator Gordy Clement, Cardiac Imaging Nurse Navigator [place] [facility] Direct Office Dial: [phone]   For scheduling needs, including cancellations and rescheduling, please call [place], [phone].   Important Information About Sugar

## 2022-04-22 ENCOUNTER — Ambulatory Visit: Payer: No Typology Code available for payment source | Attending: Interventional Cardiology

## 2022-04-22 LAB — TSH: TSH: 3.7 u[IU]/mL (ref 0.450–4.500)

## 2022-04-22 LAB — HEPATIC FUNCTION PANEL
ALT: 17 IU/L (ref 0–32)
AST: 11 IU/L (ref 0–40)
Albumin: 4.2 g/dL (ref 3.8–4.9)
Alkaline Phosphatase: 139 IU/L — ABNORMAL HIGH (ref 44–121)
Bilirubin Total: 0.2 mg/dL (ref 0.0–1.2)
Bilirubin, Direct: <0.1 mg/dL (ref 0.00–0.40)
Total Protein: 6.4 g/dL (ref 6.0–8.5)

## 2022-04-30 ENCOUNTER — Other Ambulatory Visit: Payer: Self-pay | Admitting: Physician Assistant

## 2022-06-25 ENCOUNTER — Encounter (HOSPITAL_COMMUNITY): Payer: Self-pay | Admitting: *Deleted

## 2022-07-07 ENCOUNTER — Ambulatory Visit (HOSPITAL_COMMUNITY): Payer: No Typology Code available for payment source | Admitting: Nurse Practitioner

## 2022-07-14 ENCOUNTER — Other Ambulatory Visit (HOSPITAL_COMMUNITY): Payer: Self-pay | Admitting: Nurse Practitioner

## 2022-07-14 ENCOUNTER — Ambulatory Visit (HOSPITAL_COMMUNITY)
Admission: RE | Admit: 2022-07-14 | Discharge: 2022-07-14 | Disposition: A | Discharge status: Home / Self Care | Payer: Self-pay | Source: Ambulatory Visit | Attending: Nurse Practitioner | Admitting: Nurse Practitioner

## 2022-07-14 DIAGNOSIS — R195 Other fecal abnormalities: Secondary | ICD-10-CM

## 2022-07-14 DIAGNOSIS — Z933 Colostomy status: Secondary | ICD-10-CM | POA: Insufficient documentation

## 2022-07-14 DIAGNOSIS — K9400 Colostomy complication, unspecified: Secondary | ICD-10-CM

## 2022-07-14 MED ORDER — DIPHENOXYLATE-ATROPINE 2.5-0.025 MG PO TABS: 1.0000 | ORAL_TABLET | Freq: Four times a day (QID) | ORAL | 1 refills | Status: AC | PRN

## 2022-07-14 NOTE — Progress Notes (Signed)
[facility]   Reason for visit:  LLq colostomy, continuously liquid.   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) Echocardiogram 1/19: EF 60-65   History of COVID-19    Hypertension 06/08/2017   OSA (obstructive sleep apnea)    Persistent atrial fibrillation (HCC)    Persistent since admx w diverticulitis in 1/22  //  failed DCCV in 3/22  //  Dofetilide load in 1/23 >> failed DCCV x 2   Tobacco abuse    Family History  Problem Relation Age of Onset   Heart failure Mother    Breast cancer Mother    Atrial fibrillation Mother    Atrial fibrillation Father    Heart failure Father    Diabetes Father    Colon cancer Neg Hx    Stomach cancer Neg Hx    Esophageal cancer Neg Hx    Pancreatic cancer Neg Hx    Liver disease Neg Hx    Allergies  Allergen Reactions   Heparin Other (See Comments)    06/23/20 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      amiodarone (PACERONE) 200 MG tablet Take 1 tablet by mouth daily. 30 tablet 0    apixaban (ELIQUIS) 5 MG TABS tablet Take 1 tablet (5 mg total) by mouth 2 (two) times daily. 180 tablet 1    diphenoxylate-atropine (LOMOTIL) 2.5-0.025 MG tablet Take 1 tablet by mouth 4 (four) times daily as needed for diarrhea or loose stools. 30 tablet 1    furosemide (LASIX) 20 MG tablet Take 20 mg by mouth as needed for fluid or edema.      Magnesium 250 MG TABS Take 250 mg by mouth daily.       metoprolol succinate (TOPROL-XL) 25 MG 24 hr tablet TAKE 3 TABLETS BY MOUTH ONCE DAILY **TAKE  WITH  OR  IMMEDIATELY  FOLLOWING  A  MEAL** 90 tablet 3    metoprolol tartrate (LOPRESSOR) 25 MG tablet Take 1 tablet (25 mg total) by mouth once for 1 dose. Take 90-120 minutes prior to scan. 1 tablet 0    potassium chloride (KLOR-CON) 10 MEQ tablet Take 1 tablet by mouth once daily 30 tablet 10    sacubitril-valsartan (ENTRESTO) 24-26 MG Take 1 tablet by mouth twice daily 60 tablet 5    No current facility-administered medications for this encounter.   ROS  Review of Systems  Gastrointestinal:        Liquid stools continuously, takes Immodium  Skin:  Positive for rash.       Peristomal irritation  Psychiatric/Behavioral: Negative.    All other systems reviewed and are negative.  Vital signs:  There were no vitals taken for this visit. Exam:  Physical Exam Constitutional:      Appearance: She is obese.  Abdominal:     Palpations: Abdomen is soft.  Skin:    General: Skin is warm and dry.  Findings: Erythema present.  Neurological:     Mental Status: She is alert and oriented to person, place, and time.  Psychiatric:        Mood and Affect: Mood normal.        Behavior: Behavior normal.     Stoma type/location:  LLQ colostomy Stomal assessment/size:  1 3/8" slightly budded Peristomal assessment:  erythema, irritation Treatment options for stomal/peristomal skin: barrier ring and 1 piece pouch.  Prefers flat and not convex  Has been experiencing leaks.  I recommend alternating convex with flat as convex will likely stay in place longer.  Her new insurance has a high deductible and acquiring supplies is challenging for her.  Output: liquid brown stool Ostomy pouching: 1pc. Soft convex applied today.   Education provided:  Stoma powder and skin prep. Soft convex pouch.  I provide samples as well.  Encourage her to reach out to manufacturers to request samples    Impression/dx   Colostomy High output ostomy Discussion  Given short course lomotil Plan  See back as needed    Visit time: 45 minutes.   Domenic Moras FNP-BC

## 2022-07-17 DIAGNOSIS — R195 Other fecal abnormalities: Secondary | ICD-10-CM | POA: Insufficient documentation

## 2022-07-17 NOTE — Discharge Instructions (Signed)
Samples given today Contact manufacturers for samples Due to insured status, would not qualify for uninsured patient assistance through [employer]

## 2022-07-21 ENCOUNTER — Other Ambulatory Visit: Payer: Self-pay | Admitting: Physician Assistant

## 2022-07-27 NOTE — Progress Notes (Unsigned)
  Electrophysiology Office Follow up Visit Note:    Date:  07/28/2022   ID:  Tammy Davies, DOB [birth year], MRN [id number]  PCP:  [facility] [place] Cardiologist:  Sinclair Grooms, MD (Inactive)  [facility] Electrophysiologist:  Vickie Epley, MD    Interval History:    Tammy Davies is a 62 y.o. female who presents for a follow up visit.   I last saw the patient July 14, 2021 for persistent atrial fibrillation.  An ablation was scheduled but this was ultimately canceled due to abnormal imaging findings.  On cross-sectional imaging there was a ovoid structure adjacent to the aorta at the anterior sinotubular junction.  It was near the RCA origin.  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  She tells me she has been doing well since I last saw her.  No recurrences of atrial fibrillation that she is aware of.     Past medical, surgical, social and family history were reviewed.  ROS:   Please see the history of present illness.    All other systems reviewed and are negative.      Physical Exam:    VS:  BP 124/72   Pulse 65   Ht 5\' 8"  (1.727 m)   Wt 257 lb 6.4 oz (116.8 kg)   SpO2 94%   BMI 39.14 kg/m     Wt Readings from Last 3 Encounters:  07/28/22 257 lb 6.4 oz (116.8 kg)  04/21/22 262 lb 6.4 oz (119 kg)  01/16/22 268 lb 12.8 oz (121.9 kg)     GEN:  Well nourished, well developed in no acute distress.  Obese CARDIAC: RRR, no murmurs, rubs, gallops RESPIRATORY:  Clear to auscultation without rales, wheezing or rhonchi       ASSESSMENT:    1. Persistent atrial fibrillation (HCC)   2. HFrEF (heart failure with reduced ejection fraction) (HCC)   3. Cardiac mass   4. Primary hypertension    PLAN:    In order of problems listed above:  #Persistent atrial fibrillation Maintaining sinus rhythm on amiodarone 200 mg by mouth daily.  Decrease this to 100 mg by mouth  daily. LFTs and thyroid function were normal in December 2023. On Eliquis for stroke prophylaxis. Plan to repeat LFTs and thyroid function today.  She will need to follow-up in about 6 months.  #Chronic systolic heart failure NYHA class II.  Warm and dry on exam.  Continue current medical therapy including Entresto, metoprolol, Lasix.  #Cardiac mass  #Hypertension At goal today.  Recommend checking blood pressures 1-2 times per week at home and recording the values.  Recommend bringing these recordings to the primary care physician.    Follow-up 6 months with APP.    Signed, Lars Mage, MD, [facility], [facility] 07/28/2022 8:38 AM    Electrophysiology [facility]

## 2022-07-28 ENCOUNTER — Encounter: Payer: Self-pay | Admitting: Cardiology

## 2022-07-28 ENCOUNTER — Ambulatory Visit: Payer: Self-pay | Attending: Cardiology | Admitting: Cardiology

## 2022-07-28 VITALS — BP 124/72 | HR 65 | Ht 68.0 in | Wt 257.4 lb

## 2022-07-28 DIAGNOSIS — I5020 Unspecified systolic (congestive) heart failure: Secondary | ICD-10-CM

## 2022-07-28 DIAGNOSIS — I4819 Other persistent atrial fibrillation: Secondary | ICD-10-CM

## 2022-07-28 DIAGNOSIS — I5189 Other ill-defined heart diseases: Secondary | ICD-10-CM

## 2022-07-28 DIAGNOSIS — I10 Essential (primary) hypertension: Secondary | ICD-10-CM

## 2022-07-28 LAB — COMPREHENSIVE METABOLIC PANEL
ALT: 15 IU/L (ref 0–32)
AST: 13 IU/L (ref 0–40)
Albumin/Globulin Ratio: 1.6 (ref 1.2–2.2)
Albumin: 3.8 g/dL — ABNORMAL LOW (ref 3.9–4.9)
Alkaline Phosphatase: 156 IU/L — ABNORMAL HIGH (ref 44–121)
BUN/Creatinine Ratio: 15 (ref 12–28)
BUN: 15 mg/dL (ref 8–27)
Bilirubin Total: 0.2 mg/dL (ref 0.0–1.2)
CO2: 25 mmol/L (ref 20–29)
Calcium: 9.2 mg/dL (ref 8.7–10.3)
Chloride: 103 mmol/L (ref 96–106)
Creatinine, Ser: 1.02 mg/dL — ABNORMAL HIGH (ref 0.57–1.00)
Globulin, Total: 2.4 g/dL (ref 1.5–4.5)
Glucose: 94 mg/dL (ref 70–99)
Potassium: 4.8 mmol/L (ref 3.5–5.2)
Sodium: 138 mmol/L (ref 134–144)
Total Protein: 6.2 g/dL (ref 6.0–8.5)
eGFR: 63 mL/min/{1.73_m2} (ref >59)

## 2022-07-28 LAB — T4, FREE: Free T4: 1.21 ng/dL (ref 0.82–1.77)

## 2022-07-28 LAB — TSH: TSH: 2.94 u[IU]/mL (ref 0.450–4.500)

## 2022-07-28 MED ORDER — AMIODARONE HCL 100 MG PO TABS: 100.0000 mg | ORAL_TABLET | Freq: Every day | ORAL | 3 refills | Status: DC

## 2022-07-28 NOTE — Patient Instructions (Signed)
Medication Instructions:  Your physician has recommended you make the following change in your medication:  1) DECREASE amiodarone to 100 mg daily  *If you need a refill on your cardiac medications before your next appointment, please call your pharmacy*  Lab Work: TODAY: TSH, T4, CMET If you have labs (blood work) drawn today and your tests are completely normal, you will receive your results only by: Ernest (if you have MyChart) OR A paper copy in the mail If you have any lab test that is abnormal or we need to change your treatment, we will call you to review the results.  Follow-Up: At [facility], you and your health needs are our priority.  As part of our continuing mission to provide you with exceptional heart care, we have created designated Provider Care Teams.  These Care Teams include your primary Cardiologist (physician) and Advanced Practice Providers (APPs -  Physician Assistants and Nurse Practitioners) who all work together to provide you with the care you need, when you need it.  Your next appointment:   6 month(s)  Provider:   You will see one of the following Advanced Practice Providers on your designated Care Team:   Tommye Standard, [place]" [place], [place], NP

## 2022-08-01 ENCOUNTER — Emergency Department (HOSPITAL_BASED_OUTPATIENT_CLINIC_OR_DEPARTMENT_OTHER): Payer: Commercial Managed Care - PPO

## 2022-08-01 ENCOUNTER — Emergency Department (HOSPITAL_BASED_OUTPATIENT_CLINIC_OR_DEPARTMENT_OTHER)
Admission: EM | Admit: 2022-08-01 | Discharge: 2022-08-01 | Disposition: A | Discharge status: Home / Self Care | Payer: Commercial Managed Care - PPO | Attending: Emergency Medicine | Admitting: Emergency Medicine

## 2022-08-01 ENCOUNTER — Other Ambulatory Visit: Payer: Self-pay

## 2022-08-01 ENCOUNTER — Encounter (HOSPITAL_BASED_OUTPATIENT_CLINIC_OR_DEPARTMENT_OTHER): Payer: Self-pay | Admitting: Emergency Medicine

## 2022-08-01 DIAGNOSIS — Z7901 Long term (current) use of anticoagulants: Secondary | ICD-10-CM | POA: Insufficient documentation

## 2022-08-01 DIAGNOSIS — I4892 Unspecified atrial flutter: Secondary | ICD-10-CM | POA: Diagnosis not present

## 2022-08-01 DIAGNOSIS — I509 Heart failure, unspecified: Secondary | ICD-10-CM | POA: Diagnosis not present

## 2022-08-01 DIAGNOSIS — R42 Dizziness and giddiness: Secondary | ICD-10-CM | POA: Diagnosis present

## 2022-08-01 DIAGNOSIS — R7989 Other specified abnormal findings of blood chemistry: Secondary | ICD-10-CM | POA: Diagnosis not present

## 2022-08-01 LAB — BASIC METABOLIC PANEL
Anion gap: 8 (ref 5–15)
BUN: 16 mg/dL (ref 8–23)
CO2: 26 mmol/L (ref 22–32)
Calcium: 10.3 mg/dL (ref 8.9–10.3)
Chloride: 103 mmol/L (ref 98–111)
Creatinine, Ser: 1.48 mg/dL — ABNORMAL HIGH (ref 0.44–1.00)
GFR, Estimated: 40 mL/min — ABNORMAL LOW (ref >60)
Glucose, Bld: 99 mg/dL (ref 70–99)
Potassium: 4.6 mmol/L (ref 3.5–5.1)
Sodium: 137 mmol/L (ref 135–145)

## 2022-08-01 LAB — TROPONIN I (HIGH SENSITIVITY): Troponin I (High Sensitivity): 5 ng/L (ref <18)

## 2022-08-01 LAB — CBC
HCT: 47.4 % — ABNORMAL HIGH (ref 36.0–46.0)
Hemoglobin: 15.2 g/dL — ABNORMAL HIGH (ref 12.0–15.0)
MCH: 28 pg (ref 26.0–34.0)
MCHC: 32.1 g/dL (ref 30.0–36.0)
MCV: 87.3 fL (ref 80.0–100.0)
Platelets: 398 10*3/uL (ref 150–400)
RBC: 5.43 MIL/uL — ABNORMAL HIGH (ref 3.87–5.11)
RDW: 14.4 % (ref 11.5–15.5)
WBC: 9.9 10*3/uL (ref 4.0–10.5)
nRBC: 0 % (ref 0.0–0.2)

## 2022-08-01 LAB — BRAIN NATRIURETIC PEPTIDE: B Natriuretic Peptide: 283.2 pg/mL — ABNORMAL HIGH (ref 0.0–100.0)

## 2022-08-01 LAB — MAGNESIUM: Magnesium: 2 mg/dL (ref 1.7–2.4)

## 2022-08-01 MED ORDER — FENTANYL CITRATE PF 50 MCG/ML IJ SOSY
50.0000 ug | PREFILLED_SYRINGE | Freq: Once | INTRAMUSCULAR | Status: AC
  Administered 2022-08-01: 50 ug via INTRAVENOUS
  Filled 2022-08-01: qty 1

## 2022-08-01 MED ORDER — ETOMIDATE 2 MG/ML IV SOLN
10.0000 mg | Freq: Once | INTRAVENOUS | Status: AC
  Administered 2022-08-01: 10 mg via INTRAVENOUS
  Filled 2022-08-01: qty 10

## 2022-08-01 MED ORDER — ONDANSETRON HCL 4 MG/2ML IJ SOLN
4.0000 mg | Freq: Once | INTRAMUSCULAR | Status: AC
  Administered 2022-08-01: 4 mg via INTRAVENOUS
  Filled 2022-08-01: qty 2

## 2022-08-01 MED ORDER — SODIUM CHLORIDE 0.9 % IV BOLUS
500.0000 mL | Freq: Once | INTRAVENOUS | Status: AC
  Administered 2022-08-01: 500 mL via INTRAVENOUS

## 2022-08-01 NOTE — ED Notes (Signed)
RT was on standby for Patient to have her cardioversion procedure. Patient maintained vitals with HR 67, RR18 and SPO2 97% on 2 L [place]. Her ETCO was 31. Patient tolerated well no respiratory intervention needed.

## 2022-08-01 NOTE — ED Provider Notes (Signed)
[facility] Provider Note   CSN: [id number] Arrival date & time: 08/01/22  [id number]     History  Chief Complaint  Patient presents with   Dizziness    Tammy Davies is a 62 y.o. female.  Patient is a 62 year old female with a past medical history of A-fib on amiodarone and Eliquis, CHF presenting to the emergency department with dizziness and shortness of breath.  Patient states that she has had intermittent dizziness for the last few days.  She states that this morning she was feeling more lightheaded with shortness of breath which prompted her to come to the ER.  She denies any chest pain or lower extremity swelling.  She states she has been taking her medications as prescribed.  She states that she has a colostomy and had slightly more increased output than usual though is not significantly watery.  She denies any nausea or vomiting, fevers or chills.  She states that she has been in sinus rhythm since being started on her amiodarone.  The history is provided by the patient.  Dizziness      Home Medications Prior to Admission medications   Medication Sig Start Date End Date Taking? Authorizing Provider  acetaminophen (TYLENOL) 500 MG tablet Take 1,000 mg by mouth every 6 (six) hours as needed for mild pain, fever or headache.    [provider]  amiodarone (PACERONE) 100 MG tablet Take 1 tablet (100 mg total) by mouth daily. 07/28/22   Vickie Epley, MD  apixaban (ELIQUIS) 5 MG TABS tablet Take 1 tablet (5 mg total) by mouth 2 (two) times daily. 12/25/21   Belva Crome, MD  diphenoxylate-atropine (LOMOTIL) 2.5-0.025 MG tablet Take 1 tablet by mouth 4 (four) times daily as needed for diarrhea or loose stools. 07/14/22 07/14/23  MayoDeliah Goody, FNP  furosemide (LASIX) 20 MG tablet Take 1 tablet by mouth once daily 07/21/22   Richardson Dopp T, PA-C  Magnesium 250 MG TABS Take 250 mg by mouth daily.    [provider]   metoprolol succinate (TOPROL-XL) 25 MG 24 hr tablet TAKE 3 TABLETS BY MOUTH ONCE DAILY **TAKE  WITH  OR  IMMEDIATELY  FOLLOWING  A  MEAL** 04/14/22   Donato Heinz, MD  metoprolol tartrate (LOPRESSOR) 25 MG tablet Take 1 tablet (25 mg total) by mouth once for 1 dose. Take 90-120 minutes prior to scan. 04/21/22 04/21/22  Belva Crome, MD  potassium chloride (KLOR-CON) 10 MEQ tablet Take 1 tablet by mouth once daily 09/12/21   Belva Crome, MD  sacubitril-valsartan [facility]) 24-26 MG Take 1 tablet by mouth twice daily 04/30/22   Richardson Dopp T, PA-C      Allergies    Heparin    Review of Systems   Review of Systems  Neurological:  Positive for dizziness.    Physical Exam Updated Vital Signs BP 98/66   Pulse 65   Temp 97.8 F (36.6 C) (Oral)   Resp 17   SpO2 97%  Physical Exam Vitals and nursing note reviewed.  Constitutional:      General: She is not in acute distress.    Appearance: Normal appearance. She is obese.  HENT:     Head: Normocephalic and atraumatic.     Nose: Nose normal.     Mouth/Throat:     Mouth: Mucous membranes are moist.     Pharynx: Oropharynx is clear.  Eyes:     Extraocular Movements: Extraocular  movements intact.     Conjunctiva/sclera: Conjunctivae normal.  Cardiovascular:     Rate and Rhythm: Regular rhythm. Tachycardia present.     Pulses: Normal pulses.     Heart sounds: Normal heart sounds.  Pulmonary:     Effort: Pulmonary effort is normal.     Breath sounds: Normal breath sounds.  Abdominal:     General: Abdomen is flat.     Palpations: Abdomen is soft.     Tenderness: There is no abdominal tenderness.  Musculoskeletal:        General: Normal range of motion.     Right lower leg: No edema.     Left lower leg: No edema.  Skin:    General: Skin is warm and dry.  Neurological:     General: No focal deficit present.     Mental Status: She is alert and oriented to person, place, and time.  Psychiatric:        Mood and  Affect: Mood normal.        Behavior: Behavior normal.     ED Results / Procedures / Treatments   Labs (all labs ordered are listed, but only abnormal results are displayed) Labs Reviewed  CBC - Abnormal; Notable for the following components:      Result Value   RBC 5.43 (*)    Hemoglobin 15.2 (*)    HCT 47.4 (*)    All other components within normal limits  BASIC METABOLIC PANEL - Abnormal; Notable for the following components:   Creatinine, Ser 1.48 (*)    GFR, Estimated 40 (*)    All other components within normal limits  BRAIN NATRIURETIC PEPTIDE - Abnormal; Notable for the following components:   B Natriuretic Peptide 283.2 (*)    All other components within normal limits  MAGNESIUM  TROPONIN I (HIGH SENSITIVITY)    EKG EKG Interpretation  Date/Time:  Saturday August 01 2022 11:13:36 EDT Ventricular Rate:  67 PR Interval:  192 QRS Duration: 126 QT Interval:  401 QTC Calculation: 424 R Axis:   -52 Text Interpretation: Sinus rhythm Left bundle branch block Baseline wander in lead(s) II III aVL aVF V5 Now in sinus rhythm compared to prior EKG Confirmed by Leanord Asal ([id number]) on 08/01/2022 11:21:06 AM  Radiology DG Chest Port 1 View  Result Date: 08/01/2022 CLINICAL DATA:  Shortness of breath and dizziness couple days. History of AFib. Cough. EXAM: PORTABLE CHEST 1 VIEW COMPARISON:  08/02/2021 FINDINGS: Lungs are adequately inflated without focal airspace consolidation or effusion. Subtle linear atelectasis or scarring adjacent the minor fissure. Cardiomediastinal silhouette and remainder of the exam is unchanged. IMPRESSION: No active disease. Electronically Signed   By: Marin Olp M.D.   On: 08/01/2022 09:00    Procedures .Critical Care  Performed by: Kemper Durie, DO Authorized by: Kemper Durie, DO   Critical care provider statement:    Critical care time (minutes):  40   Critical care time was exclusive of:  Separately billable procedures  and treating other patients   Critical care was necessary to treat or prevent imminent or life-threatening deterioration of the following conditions:  Cardiac failure   Critical care was time spent personally by me on the following activities:  Development of treatment plan with patient or surrogate, discussions with consultants, evaluation of patient's response to treatment, examination of patient, obtaining history from patient or surrogate, ordering and performing treatments and interventions, ordering and review of laboratory studies, ordering and review of radiographic studies, pulse  oximetry, re-evaluation of patient's condition and review of old charts   I assumed direction of critical care for this patient from another provider in my specialty: no   .Cardioversion  Date/Time: 08/01/2022 11:18 AM  Performed by: Kemper Durie, DO Authorized by: Kemper Durie, DO   Consent:    Consent obtained:  Written   Consent given by:  Patient   Risks discussed:  Cutaneous burn, induced arrhythmia, pain and death   Alternatives discussed:  No treatment, rate-control medication and delayed treatment Pre-procedure details:    Cardioversion basis:  Emergent   Rhythm:  Atrial flutter   Electrode placement:  Anterior-posterior Patient sedated: Yes. Refer to sedation procedure documentation for details of sedation.  Attempt one:    Cardioversion mode:  Synchronous   Shock (Joules):  200   Shock outcome:  Conversion to normal sinus rhythm Post-procedure details:    Patient status:  Awake   Patient tolerance of procedure:  Tolerated well, no immediate complications .Sedation  Date/Time: 08/01/2022 11:19 AM  Performed by: Kemper Durie, DO Authorized by: Kemper Durie, DO   Consent:    Consent obtained:  Written   Consent given by:  Patient   Risks discussed:  Allergic reaction, dysrhythmia, prolonged hypoxia resulting in organ damage, respiratory compromise  necessitating ventilatory assistance and intubation, inadequate sedation, nausea and vomiting   Alternatives discussed:  Analgesia without sedation and anxiolysis Universal protocol:    Procedure explained and questions answered to patient or proxy's satisfaction: yes     Immediately prior to procedure, a time out was called: yes     Patient identity confirmed:  Verbally with patient Indications:    Procedure performed:  Cardioversion   Procedure necessitating sedation performed by:  Physician performing sedation Pre-sedation assessment:    Time since last food or drink:  12   ASA classification: class 2 - patient with mild systemic disease     Mouth opening:  3 or more finger widths   Mallampati score:  II - soft palate, uvula, fauces visible   Neck mobility: normal     Pre-sedation assessments completed and reviewed: pre-procedure airway patency not reviewed, pre-procedure cardiovascular function not reviewed, pre-procedure hydration status not reviewed, pre-procedure mental status not reviewed, pre-procedure nausea and vomiting status not reviewed, pre-procedure pain level not reviewed, pre-procedure respiratory function not reviewed and pre-procedure temperature not reviewed   Immediate pre-procedure details:    Reassessment: Patient reassessed immediately prior to procedure     Reviewed: vital signs, relevant labs/tests and NPO status     Verified: bag valve mask available, emergency equipment available, intubation equipment available, IV patency confirmed, oxygen available and suction available   Procedure details (see MAR for exact dosages):    Preoxygenation:  Nasal cannula   Sedation:  Etomidate   Intended level of sedation: deep   Analgesia:  Fentanyl   Intra-procedure monitoring:  Blood pressure monitoring, cardiac monitor, continuous capnometry, continuous pulse oximetry, frequent LOC assessments and frequent vital sign checks   Intra-procedure events: none     Total Provider  sedation time (minutes):  10 Post-procedure details:    Attendance: Constant attendance by certified staff until patient recovered     Recovery: Patient returned to pre-procedure baseline     Post-sedation assessments completed and reviewed: post-procedure airway patency not reviewed, post-procedure cardiovascular function not reviewed, post-procedure hydration status not reviewed, post-procedure mental status not reviewed, post-procedure nausea and vomiting status not reviewed, pain score not reviewed, post-procedure respiratory function not reviewed  and post-procedure temperature not reviewed     Patient is stable for discharge or admission: yes     Procedure completion:  Tolerated well, no immediate complications     Medications Ordered in ED Medications  sodium chloride 0.9 % bolus 500 mL (500 mLs Intravenous New Bag/Given 08/01/22 0853)  fentaNYL (SUBLIMAZE) injection 50 mcg (50 mcg Intravenous Given 08/01/22 1105)  etomidate (AMIDATE) injection 10 mg (10 mg Intravenous Given 08/01/22 1105)  ondansetron (ZOFRAN) injection 4 mg (4 mg Intravenous Given 08/01/22 1232)    ED Course/ Medical Decision Making/ A&P Clinical Course as of 08/01/22 1300  Sat Aug 01, 2022  0935 Mild Cr elevation of 1.48 from baseline 1.0 with hemoconcentration of CBC making mild dehydration likely. Remainder of work up within normal range. [VK]  [id number] Patient initially had improvement of HR and ambulated to the bathroom. Upon return she was again tachy in 110s-120s and rhythm appeared irregular on EKG. Repeat EKG with concern for possible atrial flutter. Plan to discuss with cardiology for rate control vs cardioversion. Patient reports she was supposed to decrease her amiodarone this week but has not yet started the lower dose. [VK]  [id number] I spoke with Mealor with cardiology who agreed with atrial flutter, recommended cardioversion today and continuing amiodarone 200 mg until she sees Dr. Quentin Ore again in follow up. [VK]   [id number] Patient tolerated cardioversion well and feels back to her baseline.  She was able to ambulate steadily to the bathroom without any symptoms.  She is stable for discharge home with cardiology/EP follow-up. [VK]    Clinical Course User Index [VK] Kemper Durie, DO                             Medical Decision Making This patient presents to the ED with chief complaint(s) of dizziness, SOB with pertinent past medical history of a fib, CHF which further complicates the presenting complaint. The complaint involves an extensive differential diagnosis and also carries with it a high risk of complications and morbidity.    The differential diagnosis includes ACS, arrhythmia, anemia, electrolyte abnormality, pulmonary edema, pleural effusion, pneumonia, pneumothorax, PE unlikely as she has been compliant on her Eliquis  Additional history obtained: Additional history obtained from N/A Records reviewed outpatient cardiology records  ED Course and Reassessment: Patient was tachycardic on arrival otherwise hemodynamically stable in no acute distress.  She appears euvolemic or slightly volume down on exam.  EKG on arrival does appear to be irregular rhythms though difficult to discern P waves and will have repeat EKG to evaluate for A-fib versus sinus tachycardia.  She will have labs including mag, troponin and BNP, chest x-ray performed she will be given gentle fluids and will be closely reassessed.  Independent labs interpretation:  The following labs were independently interpreted: mild Cr elevation, otherwise unchanged from prior labs  Independent visualization of imaging: - I independently visualized the following imaging with scope of interpretation limited to determining acute life threatening conditions related to emergency care: CXR, which revealed no acute disease  Consultation: - Consulted or discussed management/test interpretation w/ external professional:  cardiology  Consideration for admission or further workup: Patient has no emergent conditions requiring admission or further work-up at this time and is stable for discharge home with cardiology follow-up  Social Determinants of health: N/A    Amount and/or Complexity of Data Reviewed Labs: ordered. Radiology: ordered. ECG/medicine tests: ordered.  Risk Prescription drug management.  Final Clinical Impression(s) / ED Diagnoses Final diagnoses:  Atrial flutter with rapid ventricular response [facility])    Rx / DC Orders ED Discharge Orders          Ordered    Amb referral to AFIB Clinic        08/01/22 [place], [place] K, DO 08/01/22 1300

## 2022-08-01 NOTE — ED Triage Notes (Signed)
Pt reports SHOB and dizziness that started this morning. Pt reports taking her lasix this morning but has not had any urine output.

## 2022-08-01 NOTE — ED Notes (Addendum)
10 mg of etomidate given at 1105.  Patient cardioverted at 120 J and converted into sinus rhythm

## 2022-08-01 NOTE — ED Notes (Signed)
Unable to assess pain during procedure due to sedation  

## 2022-08-01 NOTE — ED Notes (Signed)
Dc instructions reviewed with patient. Patient voiced understanding. Dc with belongings.  °

## 2022-08-01 NOTE — Discharge Instructions (Signed)
You were seen in the emergency department for your heart palpitations.  You did appear to be back in atrial flutter.  We cardioverted you in the emergency department and your heart went back to normal sinus rhythm.  You should continue to take your amiodarone at 200 mg and do not take the decreased dose until further instructed by your cardiologist or your EP doctor.  You should call your cardiologist to schedule follow-up within the next few days to have your symptoms rechecked.  You should return to the emergency department for worsening palpitations, feeling like your heart is racing, you have severe chest pains, you pass out or if you have any other new or concerning symptoms.

## 2022-08-01 NOTE — ED Notes (Signed)
Saturation Qualification for home oxygen use:  Patient saturation while on room air at rest = 95% Patient saturation on room air while ambulating= 90%  Note: Patient does not qualify for home oxygen

## 2022-08-13 ENCOUNTER — Ambulatory Visit (HOSPITAL_COMMUNITY)
Admission: RE | Admit: 2022-08-13 | Discharge: 2022-08-13 | Disposition: A | Discharge status: Home / Self Care | Payer: Commercial Managed Care - PPO | Source: Ambulatory Visit | Attending: Physician Assistant | Admitting: Physician Assistant

## 2022-08-13 VITALS — BP 92/66 | HR 73 | Ht 68.0 in | Wt 253.8 lb

## 2022-08-13 DIAGNOSIS — E669 Obesity, unspecified: Secondary | ICD-10-CM | POA: Insufficient documentation

## 2022-08-13 DIAGNOSIS — Z7901 Long term (current) use of anticoagulants: Secondary | ICD-10-CM | POA: Insufficient documentation

## 2022-08-13 DIAGNOSIS — D6869 Other thrombophilia: Secondary | ICD-10-CM | POA: Diagnosis not present

## 2022-08-13 DIAGNOSIS — I110 Hypertensive heart disease with heart failure: Secondary | ICD-10-CM | POA: Diagnosis not present

## 2022-08-13 DIAGNOSIS — G4733 Obstructive sleep apnea (adult) (pediatric): Secondary | ICD-10-CM | POA: Insufficient documentation

## 2022-08-13 DIAGNOSIS — I5022 Chronic systolic (congestive) heart failure: Secondary | ICD-10-CM | POA: Diagnosis not present

## 2022-08-13 DIAGNOSIS — I4892 Unspecified atrial flutter: Secondary | ICD-10-CM | POA: Diagnosis not present

## 2022-08-13 DIAGNOSIS — Z6839 Body mass index (BMI) 39.0-39.9, adult: Secondary | ICD-10-CM | POA: Insufficient documentation

## 2022-08-13 DIAGNOSIS — Z5181 Encounter for therapeutic drug level monitoring: Secondary | ICD-10-CM | POA: Diagnosis not present

## 2022-08-13 DIAGNOSIS — Z79899 Other long term (current) drug therapy: Secondary | ICD-10-CM | POA: Insufficient documentation

## 2022-08-13 DIAGNOSIS — I4819 Other persistent atrial fibrillation: Secondary | ICD-10-CM | POA: Insufficient documentation

## 2022-08-13 MED ORDER — AMIODARONE HCL 200 MG PO TABS: 200.0000 mg | ORAL_TABLET | Freq: Every day | ORAL | Status: DC

## 2022-08-13 NOTE — Progress Notes (Signed)
Primary Care Physician: [facility] Referring Physician: Robbie Lis Cardiologist: Dr. Gardiner Rhyme Primary EP: Dr Wilkie Aye is a 62 y.o. 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  She was found to have persistent afib/flutter and was on Cardizem IV, transitioned to oral prior to d/c. She was on heparin for a while perioperatively but transitioned back to eliquis prior to d/c. She was also positive for Covid on admission but asymptomatic and has had all vaccines.   She was seen by Robbie Lis, PA, and still had RVR in the 120's and he added low dose BB to her daily Cardizem. However, pt misunderstood and stopped her CCB, still in atrial flutter(typical) today at 127 bpm. She states that she does not feel her heart rhythm and is tolerating this well. She expects to have a reverse colostomy in 6 months.   F/u in the afib clinic, 07/26/20. At time of cardioversion , she went from atrail flutter with 3-4 AVB, to afib with RVR at 160 bpm. She eas cardioverted 2 additional times but did not convert. She was given esmolol per anesthesiology and  was d/c in afib in the 90's. The ekg shows today that she remains in afib at 3 bpm.she states that the afib is not bothering her and she feels well. She is pending having her colonoscopy reversed in June. She continues on BB/CCB for heart rate control.   F/u in afib clinic, 03/04/21. Initially, she thought that she may have  colostomy reversed in June, however this did not occur. She was admitted in June with abdominal pain and terminal ileitis. She asked for this appointment as she has been feeling more short of breath. In SR with controlled v rate today. She mentions now that reversal may not be until January if at all, as the surgeon told her she has a lot of scar  tissue. She would like to try to restore SR. She has not noted any fluid retention. Her weight is up 12 lbs. She feels this represents true weight gain. She has cut back smoking to one pack a week. When she was in the hospital in June, she was told that she needed a sleep study as she had snoring and  desaturation in her O2 with sleep.   04/16/21, Pt is here to f/u up on echo that showed moderately reduced EF at 30-35%. I  discussed with Dr. Tamala Julian and he thought probably [facility] and would try to restore sinus rhythm to improve EF, medication adjustment to address EFdysfunction and after restoring SR, he will consider further if ischemic w/u is needed. Weight is up a few lbs, but she states not fluid. I cannot appreciate any fluid and pt states that her diet is terrible. She has cut back but continues to smoke. She actually states that her breathing is improved as compared when she asked to be seen in October. She is agreeable to coming into hospital for Tikosyn but wants to wait until early January.   Follow up in the AF clinic 05/26/21. Patient presents for dofetilide admission. She denies any missed doses of anticoagulation in the last 3 weeks.   Follow up in the AF clinic 06/05/21. Patient is s/p dofetilide loading 1/9-1/12/23 with DCCV on 05/28/21. She reports that she felt "great" for 3 days after leaving  the hospital. Unfortunately, she started feeling dyspnea on exertion on the fourth day and ECG shows afib today. She is very disappointed she is back in afib. There were no specific triggers that she could identify.   Follow up in the AF clinic 08/05/21. Patient is s/p DCCV x 2 on 06/13/21 and 07/15/21. She was admitted 08/02/21 with recorrence of her rapid afib. Dofetilide was discontinued and she was loaded on amiodarone which converted her to SR. She is scheduled for an afib and flutter ablation with Dr Quentin Ore on 09/29/21. She reports that she feels well today, no CP or increased SOB. She is on CPAP now.    Follow up in the AF clinic 08/13/22. Patient was scheduled for ablation with Dr Quentin Ore but she was found to have a mass around the RCA origin. Ablation was cancelled. 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 She was seen by Dr Quentin Ore 07/28/22 and her amiodarone was decreased. She presented to the ED 08/01/22 with dizziness and SOB. ECG demonstrated atrial flutter and she underwent DCCV at that time. There were no specific triggers that she could identify. She is in SR today and feeling well.   Today, she denies symptoms of palpitations, chest pain, orthopnea, PND, lower extremity edema, dizziness, presyncope, syncope, or neurologic sequela. The patient is tolerating medications without difficulties and is otherwise without complaint today.   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) Echocardiogram 1/19: EF 60-65   History of COVID-19    Hypertension 06/08/2017   OSA (obstructive sleep apnea)    Persistent atrial fibrillation ([place])    Persistent since admx w diverticulitis in 1/22  //  failed DCCV in 3/22  //  Dofetilide load in 1/23 >> failed DCCV x 2   Tobacco abuse    Past Surgical History:  Procedure Laterality Date   ABDOMINAL HYSTERECTOMY     CARDIOVERSION N/A 07/22/2020   Procedure: CARDIOVERSION;  Surgeon: [name], MD;  Location: [facility] ENDOSCOPY;  Service: Cardiovascular;  Laterality: N/A;   CARDIOVERSION N/A 05/28/2021   Procedure: CARDIOVERSION;  Surgeon: Geralynn Rile, MD;  Location: [place];  Service: Cardiovascular;  Laterality: N/A;   CARDIOVERSION N/A 06/13/2021   Procedure: CARDIOVERSION;  Surgeon: Berniece Salines, DO;  Location:  [place] ENDOSCOPY;  Service: Cardiovascular;  Laterality: N/A;   CARDIOVERSION N/A 07/15/2021   Procedure: CARDIOVERSION;  Surgeon: Vickie Epley, MD;  Location: [place] CV LAB;  Service: Cardiovascular;  Laterality: N/A;   CHOLECYSTECTOMY N/A 08/18/2016   Procedure: LAPAROSCOPIC CHOLECYSTECTOMY WITH  INTRAOPERATIVE CHOLANGIOGRAM;  Surgeon: Johnathan Hausen, MD;  Location: [facility] ORS;  Service: General;  Laterality: N/A;   COLECTOMY WITH COLOSTOMY CREATION/HARTMANN PROCEDURE N/A 06/18/2020   Procedure: SIGMOID COLECTOMY WITH COLOSTOMY CREATION; DRAINAGE OF PELVIC ABSCESS;  Surgeon: Rolm Bookbinder, MD;  Location: [place];  Service: General;  Laterality: N/A;   CYSTOSCOPY WITH STENT PLACEMENT Bilateral 06/18/2020   Procedure: CYSTOSCOPY WITH STENT PLACEMENT;  Surgeon: Alexis Frock, MD;  Location: [place];  Service: Urology;  Laterality: Bilateral;   LAPAROTOMY N/A 06/18/2020   Procedure: EXPLORATORY LAPAROTOMY;  Surgeon: Rolm Bookbinder, MD;  Location: [place];  Service: General;  Laterality: N/A;   LEFT HEART CATH AND  CORONARY ANGIOGRAPHY N/A 12/25/2021   Procedure: LEFT HEART CATH AND CORONARY ANGIOGRAPHY;  Surgeon: Belva Crome, MD;  Location: [place] CV LAB;  Service: Cardiovascular;  Laterality: N/A;    Current Outpatient Medications  Medication Sig Dispense Refill   acetaminophen (TYLENOL) 500 MG tablet Take 1,000 mg by mouth every 6 (six) hours as needed for mild pain, fever or headache.     amiodarone (PACERONE) 100 MG tablet Take 1 tablet (100 mg total) by mouth daily. 90 tablet 3   apixaban (ELIQUIS) 5 MG TABS tablet Take 1 tablet (5 mg total) by mouth 2 (two) times daily. 180 tablet 1   diphenoxylate-atropine (LOMOTIL) 2.5-0.025 MG tablet Take 1 tablet by mouth 4 (four) times daily as needed for diarrhea or loose stools. 30 tablet 1   furosemide (LASIX) 20 MG tablet Take 1 tablet by mouth once daily 90 tablet 2   Magnesium 250 MG TABS Take 250 mg by mouth daily.     metoprolol succinate  (TOPROL-XL) 25 MG 24 hr tablet TAKE 3 TABLETS BY MOUTH ONCE DAILY **TAKE  WITH  OR  IMMEDIATELY  FOLLOWING  A  MEAL** 90 tablet 3   metoprolol tartrate (LOPRESSOR) 25 MG tablet Take 1 tablet (25 mg total) by mouth once for 1 dose. Take 90-120 minutes prior to scan. 1 tablet 0   potassium chloride (KLOR-CON) 10 MEQ tablet Take 1 tablet by mouth once daily 30 tablet 10   sacubitril-valsartan (ENTRESTO) 24-26 MG Take 1 tablet by mouth twice daily 60 tablet 5   No current facility-administered medications for this encounter.    Allergies  Allergen Reactions   Heparin Other (See Comments)    06/23/20 HIT AB ODT =3 very high likelihood of true HIT; 2/7 SRA negative    Social History   Socioeconomic History   Marital status: Single    Spouse name: Not on file   Number of children: Not on file   Years of education: Not on file   Highest education level: Not on file  Occupational History   Not on file  Tobacco Use   Smoking status: Every Day    Types: Cigarettes    Start date: [birth year]   Smokeless tobacco: Never   Tobacco comments:    A Pack every 2 weeks 08/05/21  Vaping Use   Vaping Use: Never used  Substance and Sexual Activity   Alcohol use: No   Drug use: Yes    Types: Marijuana    Comment: last used Christmas   Sexual activity: Never  Other Topics Concern   Not on file  Social History Narrative   Not on file   Social Determinants of Health   Financial Resource Strain: Not on file  Food Insecurity: Not on file  Transportation Needs: Not on file  Physical Activity: Not on file  Stress: Not on file  Social Connections: Not on file  Intimate Partner Violence: Not on file    Family History  Problem Relation Age of Onset   Heart failure Mother    Breast cancer Mother    Atrial fibrillation Mother    Atrial fibrillation Father    Heart failure Father    Diabetes Father    Colon cancer Neg Hx    Stomach cancer Neg Hx    Esophageal cancer Neg Hx    Pancreatic cancer  Neg Hx    Liver disease Neg Hx     ROS- All systems are reviewed and negative except as per the  HPI above  Physical Exam: Vitals:   08/13/22 0832  Height: 5\' 8"  (1.727 m)    Wt Readings from Last 3 Encounters:  07/28/22 116.8 kg  04/21/22 119 kg  01/16/22 121.9 kg    Labs: Lab Results  Component Value Date   NA 137 08/01/2022   K 4.6 08/01/2022   CL 103 08/01/2022   CO2 26 08/01/2022   GLUCOSE 99 08/01/2022   BUN 16 08/01/2022   CREATININE 1.48 (H) 08/01/2022   CALCIUM 10.3 08/01/2022   PHOS 3.5 06/20/2020   MG 2.0 08/01/2022   Lab Results  Component Value Date   INR 1.2 07/15/2021   Lab Results  Component Value Date   CHOL 173 06/08/2017   HDL 40 (L) 06/08/2017   LDLCALC 110 (H) 06/08/2017   TRIG 124 06/22/2020    GEN- The patient is a well appearing female, alert and oriented x 3 today.   HEENT-head normocephalic, atraumatic, sclera clear, conjunctiva pink, hearing intact, trachea midline. Lungs- Clear to ausculation bilaterally, normal work of breathing Heart- Regular rate and rhythm, no murmurs, rubs or gallops  GI- soft, NT, ND, + BS Extremities- no clubbing, cyanosis, or edema MS- no significant deformity or atrophy Skin- no rash or lesion Psych- euthymic mood, full affect Neuro- strength and sensation are intact   EKG today demonstrates SR, inc LBBB Vent. rate 73 BPM PR interval 184 ms QRS duration 116 ms QT/QTcB 392/431 ms   Echo 09/10/21  1. Left ventricular ejection fraction, by estimation, is 40 to 45%. The  left ventricle has mildly decreased function. The left ventricle  demonstrates global hypokinesis. The left ventricular internal cavity size  was moderately dilated. Left ventricular diastolic parameters are consistent with Grade I diastolic dysfunction (impaired relaxation).   2. Right ventricular systolic function is normal. The right ventricular  size is normal. Tricuspid regurgitation signal is inadequate for assessing  PA  pressure.   3. Left atrial size was mildly dilated.   4. The mitral valve is normal in structure. Trivial mitral valve  regurgitation.   5. The aortic valve is tricuspid. There is mild thickening of the aortic  valve. Aortic valve regurgitation is not visualized. Aortic valve  sclerosis is present, with no evidence of aortic valve stenosis.   6. Aortic dilatation noted. There is borderline dilatation of the aortic  root, measuring 38 mm.   7. The inferior vena cava is normal in size with greater than 50%  respiratory variability, suggesting right atrial pressure of 3 mmHg.   Comparison(s): Compared to prior TTE in 07/2021, there is no significant  change.    CHA2DS2-VASc Score = 3  The patient's score is based upon: CHF History: 1 HTN History: 1 Diabetes History: 0 Stroke History: 0 Vascular Disease History: 0 Age Score: 0 Gender Score: 1       ASSESSMENT AND PLAN: 1. Persistent Atrial Fibrillation/atrial flutter The patient's CHA2DS2-VASc score is 3, indicating a 3.2% annual risk of stroke.   Failed dofetilide, loaded on amiodarone. 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  S/p DCCV 08/01/22 Patient in [place] today. She never decreased her amiodarone to 100 mg daily.  Will continue amiodarone 200 mg daily. Recent lab work reviewed. Will d/w Dr Quentin Ore if ablation is possible with the location of her left pulmonary veins and thrombosed RCA aneurysm. If not, amiodarone may be her best rhythm option given her previous failure of dofetilide.  Continue Eliquis 5 mg BID Continue Toprol 75 mg daily  2.  Secondary Hypercoagulable State (ICD10:  D68.69) The patient is at significant risk for stroke/thromboembolism based upon her CHA2DS2-VASc Score of 3.  Continue Apixaban (Eliquis).   3. Chronic HFrEF EF 40-45% GDMT per primary cardiology team. Fluid status appears stable today.  4.HTN Stable, no changes today.  5. Obesity  Body mass index is 39.14 kg/m. Lifestyle  modification was discussed and encouraged including regular physical activity and weight reduction.  6. OSA Encouraged compliance with CPAP therapy.  7. 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  Repeat CT scheduled. Followed by Dr Gardiner Rhyme.    Follow up with Dr Gardiner Rhyme as scheduled. AF clinic in 6 months.    [facility] [address] [place], [place] [address] [phone]

## 2022-09-11 ENCOUNTER — Other Ambulatory Visit: Payer: Self-pay | Admitting: Cardiology

## 2022-09-14 ENCOUNTER — Ambulatory Visit: Payer: Commercial Managed Care - PPO | Attending: Interventional Cardiology

## 2022-09-17 ENCOUNTER — Other Ambulatory Visit: Payer: Self-pay

## 2022-09-17 MED ORDER — POTASSIUM CHLORIDE ER 10 MEQ PO TBCR: 10.0000 meq | EXTENDED_RELEASE_TABLET | Freq: Every day | ORAL | 2 refills | Status: DC

## 2022-09-22 ENCOUNTER — Telehealth (HOSPITAL_COMMUNITY): Payer: Self-pay | Admitting: Emergency Medicine

## 2022-09-22 NOTE — Telephone Encounter (Signed)
Attempted to call patient regarding upcoming cardiac CT appointment. °Left message on voicemail with name and callback number °Balian Schaller RN Navigator Cardiac Imaging °[facility] °[phone] Office °[phone] Cell ° °

## 2022-09-23 ENCOUNTER — Ambulatory Visit (HOSPITAL_COMMUNITY)
Admission: RE | Admit: 2022-09-23 | Discharge: 2022-09-23 | Disposition: A | Discharge status: Home / Self Care | Payer: Commercial Managed Care - PPO | Source: Ambulatory Visit | Attending: Interventional Cardiology | Admitting: Interventional Cardiology

## 2022-09-23 DIAGNOSIS — R931 Abnormal findings on diagnostic imaging of heart and coronary circulation: Secondary | ICD-10-CM | POA: Diagnosis present

## 2022-09-23 DIAGNOSIS — I2541 Coronary artery aneurysm: Secondary | ICD-10-CM | POA: Insufficient documentation

## 2022-09-23 MED ORDER — SODIUM CHLORIDE 0.9 % IV BOLUS
500.0000 mL | Freq: Once | INTRAVENOUS | Status: AC
  Administered 2022-09-23: 500 mL via INTRAVENOUS

## 2022-09-23 MED ORDER — NITROGLYCERIN 0.4 MG SL SUBL
0.8000 mg | SUBLINGUAL_TABLET | SUBLINGUAL | Status: DC | PRN
  Administered 2022-09-23: 0.8 mg via SUBLINGUAL

## 2022-09-23 MED ORDER — NITROGLYCERIN 0.4 MG SL SUBL
SUBLINGUAL_TABLET | SUBLINGUAL | Status: AC
  Filled 2022-09-23: qty 2

## 2022-09-23 MED ORDER — IOHEXOL 350 MG/ML SOLN
95.0000 mL | Freq: Once | INTRAVENOUS | Status: AC | PRN
  Administered 2022-09-23: 95 mL via INTRAVENOUS

## 2022-09-28 ENCOUNTER — Other Ambulatory Visit: Payer: Self-pay | Admitting: *Deleted

## 2022-09-28 DIAGNOSIS — I5022 Chronic systolic (congestive) heart failure: Secondary | ICD-10-CM

## 2022-09-28 DIAGNOSIS — Z13220 Encounter for screening for lipoid disorders: Secondary | ICD-10-CM

## 2022-09-28 DIAGNOSIS — I4819 Other persistent atrial fibrillation: Secondary | ICD-10-CM

## 2022-10-23 ENCOUNTER — Other Ambulatory Visit: Payer: Self-pay | Admitting: Cardiology

## 2022-11-08 NOTE — Progress Notes (Unsigned)
Cardiology Office Note:    Date:  11/10/2022   ID:  Tammy Davies, DOB 1961/01/05, MRN [id number]  PCP:  [facility]  Cardiologist:  Lesleigh Noe, MD (Inactive)  Electrophysiologist:  Lanier Prude, MD   Referring MD: [facility]   Chief Complaint  Patient presents with   Congestive Heart Failure    History of Present Illness:    Tammy Davies is a 62 y.o. female with a hx of persistent atrial fibrillation, hypertension, tobacco use.  She was admitted 06/05/2020 through 06/28/2020 with sigmoid diverticulitis complicated by abscess, underwent Hartmann colectomy/colostomy.  Course complicated by persistent atrial fibrillation/flutter.  She was referred to A-fib clinic and ultimately underwent DCCV x2 on 06/13/2021 and 06/2021.  She was started on Tikosyn.  She was admitted 08/02/2021 with recurrent rapid A-fib.  Tikosyn was discontinued and she was loaded with amiodarone.  A-fib/flutter ablation was planned with Dr. Lalla Brothers on 09/29/2021.  However preablation CT was notable for 2.2 x 1.8 cm mass adjacent to right coronary ostium with evidence of vascularity.  Differential included thrombosed coronary aneurysm, saccular thrombosed aortic aneurysm, or mediastinal soft tissue mass.  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  LHC 12/25/2021 showed normal coronary arteries, no connection to mass seen.  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  Since last clinic visit, she reports she is doing well.  Denies any chest pain, does report some shortness of breath.  Denies any lightheadedness, syncope, lower extremity edema, or palpitations.  She has not been exercising.  Smoking 2-3  cigarettes per day, down from 1ppd.   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) Echocardiogram 1/19: EF 60-65   History of COVID-19    Hypertension 06/08/2017   OSA (obstructive sleep apnea)    Persistent atrial fibrillation (HCC)    Persistent since admx w diverticulitis in 1/22  //  failed DCCV in 3/22  //  Dofetilide load in 1/23 >> failed DCCV x 2   Tobacco abuse     Past Surgical History:  Procedure Laterality Date   ABDOMINAL HYSTERECTOMY     CARDIOVERSION N/A 07/22/2020   Procedure: CARDIOVERSION;  Surgeon: Chrystie Nose, MD;  Location: [facility] ENDOSCOPY;  Service: Cardiovascular;  Laterality: N/A;   CARDIOVERSION N/A 05/28/2021   Procedure: CARDIOVERSION;  Surgeon: Sande Rives, MD;  Location: [facility] ENDOSCOPY;  Service: Cardiovascular;  Laterality: N/A;   CARDIOVERSION N/A 06/13/2021   Procedure: CARDIOVERSION;  Surgeon: Thomasene Ripple, DO;  Location: [facility] ENDOSCOPY;  Service: Cardiovascular;  Laterality: N/A;   CARDIOVERSION N/A 07/15/2021   Procedure: CARDIOVERSION;  Surgeon: Lanier Prude, MD;  Location: [facility] INVASIVE CV LAB;  Service: Cardiovascular;  Laterality: N/A;   CHOLECYSTECTOMY N/A 08/18/2016   Procedure: LAPAROSCOPIC CHOLECYSTECTOMY WITH  INTRAOPERATIVE CHOLANGIOGRAM;  Surgeon: Luretha Murphy, MD;  Location: [facility] ORS;  Service: General;  Laterality: N/A;   COLECTOMY WITH COLOSTOMY CREATION/HARTMANN PROCEDURE N/A 06/18/2020   Procedure: SIGMOID COLECTOMY WITH  COLOSTOMY CREATION; DRAINAGE OF PELVIC ABSCESS;  Surgeon: Emelia Loron, MD;  Location: [facility] OR;  Service: General;  Laterality: N/A;   CYSTOSCOPY WITH STENT PLACEMENT Bilateral 06/18/2020   Procedure:  CYSTOSCOPY WITH STENT PLACEMENT;  Surgeon: Sebastian Ache, MD;  Location: [facility] OR;  Service: Urology;  Laterality: Bilateral;   LAPAROTOMY N/A 06/18/2020   Procedure: EXPLORATORY LAPAROTOMY;  Surgeon: Emelia Loron, MD;  Location: [facility] OR;  Service: General;  Laterality: N/A;   LEFT HEART CATH AND CORONARY ANGIOGRAPHY N/A 12/25/2021   Procedure: LEFT HEART CATH AND CORONARY ANGIOGRAPHY;  Surgeon: Lyn Records, MD;  Location: [facility] INVASIVE CV LAB;  Service: Cardiovascular;  Laterality: N/A;    Current Medications: Current Meds  Medication Sig   acetaminophen (TYLENOL) 500 MG tablet Take 1,000 mg by mouth every 6 (six) hours as needed for mild pain, fever or headache.   amiodarone (PACERONE) 200 MG tablet Take 1 tablet by mouth daily   apixaban (ELIQUIS) 5 MG TABS tablet Take 1 tablet (5 mg total) by mouth 2 (two) times daily.   diphenoxylate-atropine (LOMOTIL) 2.5-0.025 MG tablet Take 1 tablet by mouth 4 (four) times daily as needed for diarrhea or loose stools.   Magnesium 250 MG TABS Take 250 mg by mouth daily.   potassium chloride (KLOR-CON) 10 MEQ tablet Take 1 tablet (10 mEq total) by mouth daily.   rosuvastatin (CRESTOR) 5 MG tablet Take 5 mg by mouth daily.   sacubitril-valsartan (ENTRESTO) 24-26 MG Take 1 tablet by mouth twice daily   Vitamin D, Ergocalciferol, (DRISDOL) 1.25 MG (50000 UNIT) CAPS capsule Take 50,000 Units by mouth once a week.   [DISCONTINUED] furosemide (LASIX) 20 MG tablet Take 1 tablet by mouth once daily   [DISCONTINUED] metoprolol succinate (TOPROL-XL) 25 MG 24 hr tablet TAKE 3 TABLETS BY MOUTH ONCE DAILY**TAKE WITH OR IMMEDIATELY FOLLOWING A MEAL**     Allergies:   Heparin   Social History   Socioeconomic History   Marital status: Single    Spouse name: Not on file   Number of children: Not on file   Years of education: Not on file   Highest education level: Not on file  Occupational History   Not on file  Tobacco Use   Smoking status: Every Day     Types: Cigarettes    Start date: 16   Smokeless tobacco: Never   Tobacco comments:    A Pack every 2 weeks 08/05/21  Vaping Use   Vaping Use: Never used  Substance and Sexual Activity   Alcohol use: No   Drug use: Yes    Types: Marijuana    Comment: last used Christmas   Sexual activity: Never  Other Topics Concern   Not on file  Social History Narrative   Not on file   Social Determinants of Health   Financial Resource Strain: Not on file  Food Insecurity: Not on file  Transportation Needs: Not on file  Physical Activity: Not on file  Stress: Not on file  Social Connections: Not on file     Family History: The patient's family history includes Atrial fibrillation in her father and mother; Breast cancer in her mother; Diabetes in her father; Heart failure in her father and mother. There is no history of Colon cancer, Stomach cancer, Esophageal cancer, Pancreatic cancer, or Liver disease.  ROS:   Please see the history of present illness.     All other systems reviewed and are negative.  EKGs/Labs/Other Studies Reviewed:    The  following studies were reviewed today:   EKG:   12/18/21: sinus bradycardia, rate 52, LBBB 11/10/2022: Sinus bradycardia, rate 59, left axis deviation, LVH, 1 mm ST depressions in V4-6  Recent Labs: 07/28/2022: ALT 15; TSH 2.940 08/01/2022: B Natriuretic Peptide 283.2; BUN 16; Creatinine, Ser 1.48; Hemoglobin 15.2; Magnesium 2.0; Platelets 398; Potassium 4.6; Sodium 137  Recent Lipid Panel    Component Value Date/Time   CHOL 173 06/08/2017 0819   TRIG 124 06/22/2020 0935   HDL 40 (L) 06/08/2017 0819   CHOLHDL 4.3 06/08/2017 0819   VLDL 23 06/08/2017 0819   LDLCALC 110 (H) 06/08/2017 0819    Physical Exam:    VS:  BP 94/64   Pulse 70   Ht 5\' 8"  (1.727 m)   Wt 249 lb 3.2 oz (113 kg)   SpO2 95%   BMI 37.89 kg/m     Wt Readings from Last 3 Encounters:  11/10/22 249 lb 3.2 oz (113 kg)  08/13/22 253 lb 12.8 oz (115.1 kg)  07/28/22  257 lb 6.4 oz (116.8 kg)     GEN:  Well nourished, well developed in no acute distress HEENT: Normal NECK: No JVD; No carotid bruits CARDIAC: RRR, no murmurs, rubs, gallops RESPIRATORY:  Clear to auscultation without rales, wheezing or rhonchi  ABDOMEN: Soft, non-tender, non-distended MUSCULOSKELETAL:  No edema; No deformity  SKIN: Warm and dry NEUROLOGIC:  Alert and oriented x 3 PSYCHIATRIC:  Normal affect   ASSESSMENT:    1. Chronic systolic heart failure (HCC)   2. Coronary artery aneurysm   3. Persistent atrial fibrillation (HCC)   4. OSA (obstructive sleep apnea)   5. Tobacco use   6. Essential hypertension     PLAN:    Suspected coronary aneurysm: preablation CT was notable for 2.2 x 1.8 cm mass adjacent to right coronary ostium with evidence of vascularity.  Differential included thrombosed coronary aneurysm, saccular thrombosed aortic aneurysm, or mediastinal soft tissue mass.  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  LHC 12/25/2021 showed normal coronary arteries, no connection to mass seen.  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 -Suspected aneurysm is stable in size, will continue to monitor  Persistent atrial fibrillation: Diagnosed after admission with diverticulitis 05/2020.  Failed DCCV 07/2020.  Loaded with Tikosyn and underwent DCCV 05/2021 but had early return of A-fib.  DCCV 07/2021 also complicated by early return of A-fib.  Switched to amiodarone.  Underwent DCCV 08/01/2022.  Currently on amiodarone 200 mg daily, appears to be maintaining sinus rhythm.  On Eliquis 5 mg twice daily for anticoagulation.  On Toprol-XL 75 mg daily.  Follows with Dr. Lalla Brothers in EP  Chronic systolic  heart failure: Echocardiogram 04/07/2021 with EF 30 to 35%, global hypokinesis.  Echocardiogram 09/10/2021 EF 40 to 45%, global hypokinesis.  Cardiac MRI 11/14/2021 EF 46%, septal lateral dyssynchrony consistent with LBBB, no myocardial LGE.  Suspect tachycardia induced versus due to left bundle branch block.  No CAD on cath 12/2021 -Continue Entresto 24-26 mg twice daily -Continue Toprol-XL, will reduce dose to 50 mg daily given soft BP -Continue Lasix 20 mg as needed.  Check BMET, magnesium -Update echocardiogram  OSA: On CPAP, reports has had issues tolerating mask, has not been using.  Will refer to sleep  medicine  Hypertension: On Entresto, Toprol-XL.  BP soft in clinic today, will reduce Toprol-XL to 50 mg daily as above  AKI: Creatinine 1.48 on 08/01/2022, increased from 1.82 on 07/28/2022.  Check CMET  Tobacco use: Smoking 2-3 cigarettes per day, down from 1ppd.  Counseled of risk of tobacco use and cessation strongly encouraged   RTC in 4 months  Medication Adjustments/Labs and Tests Ordered: Current medicines are reviewed at length with the patient today.  Concerns regarding medicines are outlined above.  Orders Placed This Encounter  Procedures   Comprehensive metabolic panel   Magnesium   EKG 12-Lead   ECHOCARDIOGRAM COMPLETE   Meds ordered this encounter  Medications   metoprolol succinate (TOPROL-XL) 50 MG 24 hr tablet    Sig: Take 1 tablet (50 mg total) by mouth daily.    Dispense:  90 tablet    Refill:  3   furosemide (LASIX) 20 MG tablet    Sig: Take 1 tablet (20 mg total) by mouth daily as needed for fluid or edema (As needed for weight gain of 3 lb overnight or 5 lb in one week).    Dispense:  90 tablet    Refill:  2    Patient Instructions  Medication Instructions:  Decrease Toprol XL to 50 mg Take Lasix 20 mg daily As Needed for fluid/edema (weight gain of 2-3 lb overnight or 5 lb in a week) *If you need a refill on your cardiac medications before your next  appointment, please call your pharmacy*   Lab Work: CMET, MAG If you have labs (blood work) drawn today and your tests are completely normal, you will receive your results only by: MyChart Message (if you have MyChart) OR A paper copy in the mail If you have any lab test that is abnormal or we need to change your treatment, we will call you to review the results.   Testing/Procedures: Your physician has requested that you have an echocardiogram. Echocardiography is a painless test that uses sound waves to create images of your heart. It provides your doctor with information about the size and shape of your heart and how well your heart's chambers and valves are working. This procedure takes approximately one hour. There are no restrictions for this procedure. Please do NOT wear cologne, perfume, aftershave, or lotions (deodorant is allowed). Please arrive 15 minutes prior to your appointment time.    Follow-Up: At [facility], you and your health needs are our priority.  As part of our continuing mission to provide you with exceptional heart care, we have created designated Provider Care Teams.  These Care Teams include your primary Cardiologist (physician) and Advanced Practice Providers (APPs -  Physician Assistants and Nurse Practitioners) who all work together to provide you with the care you need, when you need it.  We recommend signing up for the patient portal called "MyChart".  Sign up information is provided on this After Visit Summary.  MyChart is used to connect with patients for Virtual Visits (Telemedicine).  Patients are able to view lab/test results, encounter notes, upcoming appointments, etc.  Non-urgent messages can be sent to your provider as well.   To learn more about what you can do with MyChart, go to [email or web address].    Your next appointment:   4 month(s) Dr Bjorn Pippin  OSA appt- first available with Dr Tresa Endo       Signed, Little Ishikawa, MD  11/10/2022 8:36 AM    [facility]  [facility]

## 2022-11-10 ENCOUNTER — Ambulatory Visit: Payer: Commercial Managed Care - PPO | Attending: Cardiology | Admitting: Cardiology

## 2022-11-10 VITALS — BP 94/64 | HR 70 | Ht 68.0 in | Wt 249.2 lb

## 2022-11-10 DIAGNOSIS — I4819 Other persistent atrial fibrillation: Secondary | ICD-10-CM

## 2022-11-10 DIAGNOSIS — I2541 Coronary artery aneurysm: Secondary | ICD-10-CM | POA: Diagnosis not present

## 2022-11-10 DIAGNOSIS — G4733 Obstructive sleep apnea (adult) (pediatric): Secondary | ICD-10-CM | POA: Diagnosis not present

## 2022-11-10 DIAGNOSIS — I5022 Chronic systolic (congestive) heart failure: Secondary | ICD-10-CM | POA: Diagnosis not present

## 2022-11-10 DIAGNOSIS — Z720 Tobacco use: Secondary | ICD-10-CM

## 2022-11-10 DIAGNOSIS — I10 Essential (primary) hypertension: Secondary | ICD-10-CM

## 2022-11-10 MED ORDER — FUROSEMIDE 20 MG PO TABS: 20.0000 mg | ORAL_TABLET | Freq: Every day | ORAL | 2 refills | Status: AC | PRN

## 2022-11-10 MED ORDER — METOPROLOL SUCCINATE ER 50 MG PO TB24: 50.0000 mg | ORAL_TABLET | Freq: Every day | ORAL | 3 refills | Status: DC

## 2022-11-10 NOTE — Patient Instructions (Signed)
Medication Instructions:  Decrease Toprol XL to 50 mg Take Lasix 20 mg daily As Needed for fluid/edema (weight gain of 2-3 lb overnight or 5 lb in a week) *If you need a refill on your cardiac medications before your next appointment, please call your pharmacy*   Lab Work: CMET, MAG If you have labs (blood work) drawn today and your tests are completely normal, you will receive your results only by: MyChart Message (if you have MyChart) OR A paper copy in the mail If you have any lab test that is abnormal or we need to change your treatment, we will call you to review the results.   Testing/Procedures: Your physician has requested that you have an echocardiogram. Echocardiography is a painless test that uses sound waves to create images of your heart. It provides your doctor with information about the size and shape of your heart and how well your heart's chambers and valves are working. This procedure takes approximately one hour. There are no restrictions for this procedure. Please do NOT wear cologne, perfume, aftershave, or lotions (deodorant is allowed). Please arrive 15 minutes prior to your appointment time.    Follow-Up: At [facility], you and your health needs are our priority.  As part of our continuing mission to provide you with exceptional heart care, we have created designated Provider Care Teams.  These Care Teams include your primary Cardiologist (physician) and Advanced Practice Providers (APPs -  Physician Assistants and Nurse Practitioners) who all work together to provide you with the care you need, when you need it.  We recommend signing up for the patient portal called "MyChart".  Sign up information is provided on this After Visit Summary.  MyChart is used to connect with patients for Virtual Visits (Telemedicine).  Patients are able to view lab/test results, encounter notes, upcoming appointments, etc.  Non-urgent messages can be sent to your provider as  well.   To learn more about what you can do with MyChart, go to [email or web address].    Your next appointment:   4 month(s) Dr Bjorn Pippin  OSA appt- first available with Dr Tresa Endo

## 2022-11-11 LAB — COMPREHENSIVE METABOLIC PANEL
ALT: 14 IU/L (ref 0–32)
AST: 15 IU/L (ref 0–40)
Albumin: 4 g/dL (ref 3.9–4.9)
Alkaline Phosphatase: 135 IU/L — ABNORMAL HIGH (ref 44–121)
BUN/Creatinine Ratio: 16 (ref 12–28)
BUN: 17 mg/dL (ref 8–27)
Bilirubin Total: 0.3 mg/dL (ref 0.0–1.2)
CO2: 23 mmol/L (ref 20–29)
Calcium: 9.4 mg/dL (ref 8.7–10.3)
Chloride: 104 mmol/L (ref 96–106)
Creatinine, Ser: 1.07 mg/dL — ABNORMAL HIGH (ref 0.57–1.00)
Globulin, Total: 2.4 g/dL (ref 1.5–4.5)
Glucose: 157 mg/dL — ABNORMAL HIGH (ref 70–99)
Potassium: 4.2 mmol/L (ref 3.5–5.2)
Sodium: 140 mmol/L (ref 134–144)
Total Protein: 6.4 g/dL (ref 6.0–8.5)
eGFR: 59 mL/min/{1.73_m2} — ABNORMAL LOW (ref >59)

## 2022-11-11 LAB — MAGNESIUM: Magnesium: 1.9 mg/dL (ref 1.6–2.3)

## 2022-11-25 ENCOUNTER — Telehealth: Payer: Self-pay | Admitting: Cardiology

## 2022-11-25 NOTE — Telephone Encounter (Signed)
Patient with diagnosis of afib on Eliquis for anticoagulation.    Procedure: colonoscopy Date of procedure: 11/26/22   CHA2DS2-VASc Score = 3   This indicates a 3.2% annual risk of stroke. The patient's score is based upon: CHF History: 1 HTN History: 1 Diabetes History: 0 Stroke History: 0 Vascular Disease History: 0 Age Score: 0 Gender Score: 1      CrCl 72 ml/min  Per office protocol, patient can hold Eliquis for 2 days prior to procedure.    Since procedure is tomorrow, please confirm that patient held doses both yesterday and today.  **This guidance is not considered finalized until pre-operative APP has relayed final recommendations.**

## 2022-11-25 NOTE — Telephone Encounter (Signed)
   Pre-operative Risk Assessment    Patient Name: Tammy Davies  DOB: 1960/07/21 MRN: [id number]      Request for Surgical Clearance    Procedure:   Colonoscopy  Date of Surgery:  Clearance 11/26/22                                 Surgeon:  Dr. Cloretta Ned Surgeon's Group or Practice Name:  [facility]  Phone number:  [phone] Fax number:  [phone]  [name]   Type of Clearance Requested:   - Pharmacy:  Hold Apixaban (Eliquis) Hold for two days    Type of Anesthesia:   Propafol    Additional requests/questions:      [name]   11/25/2022, 11:38 AM

## 2022-11-25 NOTE — Telephone Encounter (Signed)
   Patient Name: Tammy Davies  DOB: 07-11-1960 MRN: [id number]  Primary Cardiologist: Lesleigh Noe, MD (Inactive)  Chart reviewed as part of pre-operative protocol coverage. Pre-op clearance already addressed by colleagues in earlier phone notes. To summarize recommendations:  -Per office protocol, patient can hold Eliquis for 2 days prior to procedure.      No medical clearance was requested.  Will route this bundled recommendation to requesting provider via Epic fax function and remove from pre-op pool. Please call with questions.  Sharlene Dory, PA-C 11/25/2022, 4:43 PM

## 2022-12-09 ENCOUNTER — Ambulatory Visit (HOSPITAL_COMMUNITY): Payer: Commercial Managed Care - PPO | Attending: Cardiovascular Disease

## 2022-12-09 DIAGNOSIS — I5022 Chronic systolic (congestive) heart failure: Secondary | ICD-10-CM | POA: Insufficient documentation

## 2022-12-09 LAB — ECHOCARDIOGRAM COMPLETE
Area-P 1/2: 2.93 cm2
S' Lateral: 3.4 cm

## 2022-12-09 MED ORDER — PERFLUTREN LIPID MICROSPHERE
1.0000 mL | INTRAVENOUS | Status: AC | PRN
  Administered 2022-12-09: 3 mL via INTRAVENOUS

## 2022-12-14 ENCOUNTER — Other Ambulatory Visit: Payer: Self-pay | Admitting: Cardiology

## 2022-12-16 ENCOUNTER — Telehealth: Payer: Self-pay | Admitting: Cardiology

## 2022-12-16 ENCOUNTER — Encounter: Payer: Self-pay | Admitting: Cardiology

## 2022-12-16 NOTE — Telephone Encounter (Signed)
Called pharmacy and pharmacy stated that her Metoprolol of 50 mg daily is ready for pick up. Pharmacy staff stated that Ms. Elsbury asked if we sent anything today and that why they told her it wasn't ready because we didn't send anything for day. I explained that we sent the refill back in June of this year and the pharmacy staff looked and said it was ready. I called and left pt a message.

## 2022-12-16 NOTE — Telephone Encounter (Signed)
Error

## 2022-12-16 NOTE — Telephone Encounter (Signed)
*  STAT* If patient is at the pharmacy, call can be transferred to refill team.   1. Which medications need to be refilled? (please list name of each medication and dose if known)   metoprolol succinate (TOPROL-XL) 50 MG 24 hr tablet   2. Would you like to learn more about the convenience, safety, & potential cost savings by using the [facility]?   3. Are you open to using the [facility] (Type [facility]. ).   4. Which pharmacy/location (including street and city if local pharmacy) is medication to be sent to?  [facility] - [place], [place] - [address]   5. Do they need a 30 day or 90 day supply?   90 day  Patient stated she has about 3 days left of this medication.  Patient wants prescription re-sent to pharmacy as they stated they had not received the refill request.

## 2023-01-10 ENCOUNTER — Other Ambulatory Visit: Payer: Self-pay | Admitting: Physician Assistant

## 2023-02-09 ENCOUNTER — Encounter (HOSPITAL_COMMUNITY): Payer: Self-pay | Admitting: Physician Assistant

## 2023-02-09 ENCOUNTER — Ambulatory Visit (HOSPITAL_COMMUNITY)
Admission: RE | Admit: 2023-02-09 | Discharge: 2023-02-09 | Disposition: A | Discharge status: Home / Self Care | Payer: Commercial Managed Care - PPO | Source: Ambulatory Visit | Attending: Physician Assistant | Admitting: Physician Assistant

## 2023-02-09 VITALS — BP 108/78 | HR 61 | Ht 68.0 in | Wt 254.0 lb

## 2023-02-09 DIAGNOSIS — G4733 Obstructive sleep apnea (adult) (pediatric): Secondary | ICD-10-CM | POA: Insufficient documentation

## 2023-02-09 DIAGNOSIS — I110 Hypertensive heart disease with heart failure: Secondary | ICD-10-CM | POA: Diagnosis not present

## 2023-02-09 DIAGNOSIS — I5022 Chronic systolic (congestive) heart failure: Secondary | ICD-10-CM | POA: Insufficient documentation

## 2023-02-09 DIAGNOSIS — I4819 Other persistent atrial fibrillation: Secondary | ICD-10-CM

## 2023-02-09 DIAGNOSIS — Z7901 Long term (current) use of anticoagulants: Secondary | ICD-10-CM | POA: Insufficient documentation

## 2023-02-09 DIAGNOSIS — I4892 Unspecified atrial flutter: Secondary | ICD-10-CM | POA: Insufficient documentation

## 2023-02-09 DIAGNOSIS — D6869 Other thrombophilia: Secondary | ICD-10-CM

## 2023-02-09 DIAGNOSIS — Z6838 Body mass index (BMI) 38.0-38.9, adult: Secondary | ICD-10-CM | POA: Diagnosis not present

## 2023-02-09 DIAGNOSIS — Z5181 Encounter for therapeutic drug level monitoring: Secondary | ICD-10-CM | POA: Diagnosis not present

## 2023-02-09 DIAGNOSIS — E669 Obesity, unspecified: Secondary | ICD-10-CM | POA: Insufficient documentation

## 2023-02-09 DIAGNOSIS — R9431 Abnormal electrocardiogram [ECG] [EKG]: Secondary | ICD-10-CM | POA: Insufficient documentation

## 2023-02-09 DIAGNOSIS — I10 Essential (primary) hypertension: Secondary | ICD-10-CM | POA: Diagnosis present

## 2023-02-09 DIAGNOSIS — I480 Paroxysmal atrial fibrillation: Secondary | ICD-10-CM | POA: Diagnosis present

## 2023-02-09 DIAGNOSIS — Z79899 Other long term (current) drug therapy: Secondary | ICD-10-CM

## 2023-02-09 LAB — COMPREHENSIVE METABOLIC PANEL
ALT: UNDETERMINED U/L (ref 0–44)
AST: UNDETERMINED U/L (ref 15–41)
Albumin: 3.2 g/dL — ABNORMAL LOW (ref 3.5–5.0)
Alkaline Phosphatase: 99 U/L (ref 38–126)
Anion gap: 10 (ref 5–15)
BUN: 15 mg/dL (ref 8–23)
CO2: 20 mmol/L — ABNORMAL LOW (ref 22–32)
Calcium: 8.5 mg/dL — ABNORMAL LOW (ref 8.9–10.3)
Chloride: 106 mmol/L (ref 98–111)
Creatinine, Ser: 0.92 mg/dL (ref 0.44–1.00)
GFR, Estimated: >60 mL/min (ref >60)
Glucose, Bld: 116 mg/dL — ABNORMAL HIGH (ref 70–99)
Potassium: 4.3 mmol/L (ref 3.5–5.1)
Sodium: 136 mmol/L (ref 135–145)
Total Bilirubin: UNDETERMINED mg/dL (ref 0.3–1.2)
Total Protein: 6.4 g/dL — ABNORMAL LOW (ref 6.5–8.1)

## 2023-02-09 LAB — TSH: TSH: 2.717 u[IU]/mL (ref 0.350–4.500)

## 2023-02-09 MED ORDER — AMIODARONE HCL 200 MG PO TABS: 100.0000 mg | ORAL_TABLET | Freq: Every day | ORAL | Status: DC

## 2023-02-09 NOTE — Progress Notes (Signed)
Primary Care Physician: [facility] Referring Physician: Chelsea Aus Cardiologist: Dr. Bjorn Pippin Primary EP: Dr Gasper Lloyd is a 62 y.o. 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  She was found to have persistent afib/flutter and was on Cardizem IV, transitioned to oral prior to d/c. She was on heparin for a while perioperatively but transitioned back to eliquis prior to d/c. She was also positive for Covid on admission but asymptomatic and has had all vaccines.   She was seen by Chelsea Aus, PA, and still had RVR in the 120's and he added low dose BB to her daily Cardizem. However, pt misunderstood and stopped her CCB, still in atrial flutter(typical) today at 127 bpm. She states that she does not feel her heart rhythm and is tolerating this well. She expects to have a reverse colostomy in 6 months.   F/u in the afib clinic, 07/26/20. At time of cardioversion , she went from atrail flutter with 3-4 AVB, to afib with RVR at 160 bpm. She eas cardioverted 2 additional times but did not convert. She was given esmolol per anesthesiology and  was d/c in afib in the 90's. The ekg shows today that she remains in afib at 63 bpm.she states that the afib is not bothering her and she feels well. She is pending having her colonoscopy reversed in June. She continues on BB/CCB for heart rate control.   F/u in afib clinic, 03/04/21. Initially, she thought that she may have  colostomy reversed in June, however this did not occur. She was admitted in June with abdominal pain and terminal ileitis. She asked for this appointment as she has been feeling more short of breath. In SR with controlled v rate today. She mentions now that reversal may not be until January if at all, as the surgeon told her she has a lot of scar  tissue. She would like to try to restore SR. She has not noted any fluid retention. Her weight is up 12 lbs. She feels this represents true weight gain. She has cut back smoking to one pack a week. When she was in the hospital in June, she was told that she needed a sleep study as she had snoring and  desaturation in her O2 with sleep.   04/16/21, Pt is here to f/u up on echo that showed moderately reduced EF at 30-35%. I  discussed with Dr. Katrinka Blazing and he thought probably [facility] and would try to restore sinus rhythm to improve EF, medication adjustment to address EFdysfunction and after restoring SR, he will consider further if ischemic w/u is needed. Weight is up a few lbs, but she states not fluid. I cannot appreciate any fluid and pt states that her diet is terrible. She has cut back but continues to smoke. She actually states that her breathing is improved as compared when she asked to be seen in October. She is agreeable to coming into hospital for Tikosyn but wants to wait until early January.   Follow up in the AF clinic 05/26/21. Patient presents for dofetilide admission. She denies any missed doses of anticoagulation in the last 3 weeks.   Follow up in the AF clinic 06/05/21. Patient is s/p dofetilide loading 1/9-1/12/23 with DCCV on 05/28/21. She reports that she felt "great" for 3 days after leaving the hospital.  Unfortunately, she started feeling dyspnea on exertion on the fourth day and ECG shows afib today. She is very disappointed she is back in afib. There were no specific triggers that she could identify.   Follow up in the AF clinic 08/05/21. Patient is s/p DCCV x 2 on 06/13/21 and 07/15/21. She was admitted 08/02/21 with recorrence of her rapid afib. Dofetilide was discontinued and she was loaded on amiodarone which converted her to SR. She is scheduled for an afib and flutter ablation with Dr Lalla Brothers on 09/29/21. She reports that she feels well today, no CP or increased SOB. She is on CPAP now.    Follow up in the AF clinic 08/13/22. Patient was scheduled for ablation with Dr Lalla Brothers but she was found to have a mass around the RCA origin. Ablation was cancelled. 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 She was seen by Dr Lalla Brothers 07/28/22 and her amiodarone was decreased. She presented to the ED 08/01/22 with dizziness and SOB. ECG demonstrated atrial flutter and she underwent DCCV at that time. There were no specific triggers that she could identify. She is in SR today and feeling well.   Follow up in the AF clinic 02/09/23. Patient reports that she has done well since her last visit. She has not had any interim symptoms of afib. No bleeding issues on anticoagulation.   Today, she denies symptoms of palpitations, chest pain, orthopnea, PND, lower extremity edema, dizziness, presyncope, syncope, or neurologic sequela. The patient is tolerating medications without difficulties and is otherwise without complaint today.   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     apixaban (ELIQUIS) 5 MG TABS tablet Take 1 tablet (5 mg total) by mouth 2 (two) times daily. 180 tablet 1    diphenoxylate-atropine (LOMOTIL) 2.5-0.025 MG tablet Take 1 tablet by mouth 4 (four) times daily as needed for diarrhea or loose stools. 30 tablet 1   ENTRESTO 24-26 MG Take 1 tablet by mouth twice daily 180 tablet 3   furosemide (LASIX) 20 MG tablet Take 1 tablet (20 mg total) by mouth daily as needed for fluid or edema (As needed for weight gain of 3 lb overnight or 5 lb in one week). 90 tablet 2   Magnesium 250 MG TABS Take 250 mg by mouth daily.     metoprolol succinate (TOPROL-XL) 50 MG 24 hr tablet Take 1 tablet (50 mg total) by mouth daily. 90 tablet 3   potassium chloride (KLOR-CON) 10 MEQ tablet Take 1 tablet (10 mEq total) by mouth daily. 90 tablet 2   rosuvastatin (CRESTOR) 5 MG tablet Take 5 mg by mouth daily.  Vitamin D, Ergocalciferol, (DRISDOL) 1.25 MG (50000 UNIT) CAPS capsule Take 50,000 Units by mouth once a week.     amiodarone (PACERONE) 200 MG tablet Take 0.5 tablets (100 mg total) by mouth daily.     No current facility-administered medications for this encounter.    ROS- All systems are reviewed and negative except as per the HPI above  Physical Exam: Vitals:   02/09/23 0832  BP: 108/78  Pulse: 61  Weight: 115.2 kg  Height: 5\' 8"  (1.727 m)    Wt Readings from Last 3 Encounters:  02/09/23 115.2 kg  11/10/22 113 kg  08/13/22 115.1 kg    GEN: Well nourished, well developed in no acute distress NECK: No JVD; No carotid bruits CARDIAC: Regular rate and rhythm, no murmurs, rubs, gallops RESPIRATORY:  Clear to auscultation without rales, wheezing or rhonchi  ABDOMEN: Soft, non-tender, non-distended EXTREMITIES:  No edema; No deformity    EKG today demonstrates SR, IVCD Vent. rate 61 BPM PR interval 194 ms QRS duration 128 ms QT/QTcB 392/394 ms   Echo 12/09/22  1. Left ventricular ejection fraction, by estimation, is 50 to 55%. The  left ventricle has low normal function. The left ventricle has no regional  wall motion abnormalities. There is mild  concentric left ventricular  hypertrophy. Left ventricular diastolic parameters are consistent with Grade I diastolic dysfunction (impaired relaxation).   2. Right ventricular systolic function is normal. The right ventricular  size is normal. Tricuspid regurgitation signal is inadequate for assessing  PA pressure.   3. The mitral valve is grossly normal. Trivial mitral valve  regurgitation. No evidence of mitral stenosis.   4. The aortic valve is grossly normal. Aortic valve regurgitation is not  visualized. No aortic stenosis is present.   5. The inferior vena cava is normal in size with greater than 50%  respiratory variability, suggesting right atrial pressure of 3 mmHg.   Comparison(s): Changes from prior study are noted. The left ventricular  function has improved.     CHA2DS2-VASc Score = 3  The patient's score is based upon: CHF History: 1 HTN History: 1 Diabetes History: 0 Stroke History: 0 Vascular Disease History: 0 Age Score: 0 Gender Score: 1       ASSESSMENT AND PLAN: Persistent Atrial Fibrillation/atrial flutter The patient's CHA2DS2-VASc score is 3, indicating a 3.2% annual risk of stroke.   Failed dofetilide, loaded on amiodarone. 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  Patient has been maintaining SR, would like for her to be on the lowest effective dose of amiodarone. Will decrease amiodarone to 100 mg daily. Check cmet/TSH today Continue Eliquis 5 mg BID Continue Toprol 75 mg daily  Secondary Hypercoagulable State (ICD10:  D68.69) The patient is at significant risk for stroke/thromboembolism based upon her CHA2DS2-VASc Score of 3.  Continue Apixaban (Eliquis).   Chronic HFimpEF EF 50-55% GDMT per primary cardiology team. Fluid status appears stable today  HTN Stable on current regimen  Obesity Body mass index is 38.62 kg/m.  Encouraged lifestyle modification  OSA  Encouraged nightly CPAP    Follow up in the AF clinic in 6  months.    Jorja Loa PA-C [facility] [address] [place], [place] [address] [phone]

## 2023-02-09 NOTE — Patient Instructions (Addendum)
Decrease amiodarone to 100mg  once a day (1/2 of your 200mg  tablet)

## 2023-02-12 ENCOUNTER — Encounter (HOSPITAL_COMMUNITY): Payer: Self-pay

## 2023-02-12 ENCOUNTER — Other Ambulatory Visit (HOSPITAL_COMMUNITY): Payer: Self-pay | Admitting: *Deleted

## 2023-02-12 DIAGNOSIS — I4819 Other persistent atrial fibrillation: Secondary | ICD-10-CM

## 2023-03-03 NOTE — Progress Notes (Signed)
Cardiology Office Note:    Date:  03/05/2023   ID:  Lucas Mallow, DOB [birth year], MRN [id number]  PCP:  [facility]  Cardiologist:  Little Ishikawa, MD  Electrophysiologist:  Lanier Prude, MD   Referring MD: [facility]   Chief Complaint  Patient presents with   Shortness of Breath    Pt say any physical activity     History of Present Illness:    CHAWN RACKLIFF is a 62 y.o. female with a hx of persistent atrial fibrillation, hypertension, tobacco use.  She was admitted 06/05/2020 through 06/28/2020 with sigmoid diverticulitis complicated by abscess, underwent Hartmann colectomy/colostomy.  Course complicated by persistent atrial fibrillation/flutter.  She was referred to A-fib clinic and ultimately underwent DCCV x2 on 06/13/2021 and 06/2021.  She was started on Tikosyn.  She was admitted 08/02/2021 with recurrent rapid A-fib.  Tikosyn was discontinued and she was loaded with amiodarone.  A-fib/flutter ablation was planned with Dr. Lalla Brothers on 09/29/2021.  However preablation CT was notable for 2.2 x 1.8 cm mass adjacent to right coronary ostium with evidence of vascularity.  Differential included thrombosed coronary aneurysm, saccular thrombosed aortic aneurysm, or mediastinal soft tissue mass.  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  LHC 12/25/2021 showed normal coronary arteries, no connection to mass seen.  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  Echocardiogram 12/09/2022 showed EF 50 to 55%, normal RV function, no significant valvular disease.  Since last clinic visit, she reports she is doing well.  Does state that she has been feeling short of breath.   Denies any chest pain.  Walks dog daily for exercise.  She reports some lightheadedness but denies any syncope.  Has not noted any lower extremity edema.  Does report occasional palpitations.  Denies any bleeding on Eliquis.  Has cut down smoking to 1 pack/month.  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) Echocardiogram 1/19: EF 60-65   History of COVID-19    Hypertension 06/08/2017   OSA (obstructive sleep apnea)    Persistent atrial fibrillation (HCC)    Persistent since admx w diverticulitis in 1/22  //  failed DCCV in 3/22  //  Dofetilide load in 1/23 >> failed DCCV x 2   Tobacco abuse     Past Surgical History:  Procedure Laterality Date   ABDOMINAL HYSTERECTOMY     CARDIOVERSION N/A 07/22/2020   Procedure: CARDIOVERSION;  Surgeon: Chrystie Nose, MD;  Location[facility] ENDOSCOPY;  Service: Cardiovascular;  Laterality: N/A;   CARDIOVERSION N/A 05/28/2021   Procedure: CARDIOVERSION;  Surgeon: Sande Rives, MD;  Location: [facility] ENDOSCOPY;  Service: Cardiovascular;  Laterality: N/A;   CARDIOVERSION N/A 06/13/2021   Procedure: CARDIOVERSION;  Surgeon: Thomasene Ripple, DO;  Location: [facility] ENDOSCOPY;  Service: Cardiovascular;  Laterality: N/A;   CARDIOVERSION N/A 07/15/2021   Procedure: CARDIOVERSION;  Surgeon: Lanier Prude, MD;  Location: [facility] INVASIVE CV LAB;  Service: Cardiovascular;  Laterality: N/A;   CHOLECYSTECTOMY N/A 08/18/2016   Procedure: LAPAROSCOPIC CHOLECYSTECTOMY WITH  INTRAOPERATIVE CHOLANGIOGRAM;  Surgeon: Luretha Murphy, MD;   Location: [facility] ORS;  Service: General;  Laterality: N/A;   COLECTOMY WITH COLOSTOMY CREATION/HARTMANN PROCEDURE N/A 06/18/2020   Procedure: SIGMOID COLECTOMY WITH COLOSTOMY CREATION; DRAINAGE OF PELVIC ABSCESS;  Surgeon: Emelia Loron, MD;  Location[facility] OR;  Service: General;  Laterality: N/A;   CYSTOSCOPY WITH STENT PLACEMENT Bilateral 06/18/2020   Procedure: CYSTOSCOPY WITH STENT PLACEMENT;  Surgeon: Sebastian Ache, MD;  Location: [facility] OR;  Service: Urology;  Laterality: Bilateral;   LAPAROTOMY N/A 06/18/2020   Procedure: EXPLORATORY LAPAROTOMY;  Surgeon: Emelia Loron, MD;  Location: [facility] OR;  Service: General;  Laterality: N/A;   LEFT HEART CATH AND CORONARY ANGIOGRAPHY N/A 12/25/2021   Procedure: LEFT HEART CATH AND CORONARY ANGIOGRAPHY;  Surgeon: Lyn Records, MD;  Location: [facility] INVASIVE CV LAB;  Service: Cardiovascular;  Laterality: N/A;    Current Medications: Current Meds  Medication Sig   acetaminophen (TYLENOL) 500 MG tablet Take 1,000 mg by mouth every 6 (six) hours as needed for mild pain, fever or headache.   amiodarone (PACERONE) 200 MG tablet Take 0.5 tablets (100 mg total) by mouth daily.   apixaban (ELIQUIS) 5 MG TABS tablet Take 1 tablet (5 mg total) by mouth 2 (two) times daily.   diphenoxylate-atropine (LOMOTIL) 2.5-0.025 MG tablet Take 1 tablet by mouth 4 (four) times daily as needed for diarrhea or loose stools.   ENTRESTO 24-26 MG Take 1 tablet by mouth twice daily   furosemide (LASIX) 20 MG tablet Take 1 tablet (20 mg total) by mouth daily as needed for fluid or edema (As needed for weight gain of 3 lb overnight or 5 lb in one week).   Magnesium 250 MG TABS Take 250 mg by mouth daily.   metoprolol succinate (TOPROL-XL) 50 MG 24 hr tablet Take 1 tablet (50 mg total) by mouth daily.   potassium chloride (KLOR-CON) 10 MEQ tablet Take 1 tablet (10 mEq total) by mouth daily.   rosuvastatin (CRESTOR) 5 MG tablet Take 5 mg by mouth daily.   Vitamin D, Ergocalciferol,  (DRISDOL) 1.25 MG (50000 UNIT) CAPS capsule Take 50,000 Units by mouth once a week.     Allergies:   Heparin   Social History   Socioeconomic History   Marital status: Single    Spouse name: Not on file   Number of children: Not on file   Years of education: Not on file   Highest education level: Not on file  Occupational History   Not on file  Tobacco Use   Smoking status: Every Day    Types: Cigarettes    Start date: 39   Smokeless tobacco: Never   Tobacco comments:    A Pack every 3 weeks 02/09/23  Vaping Use   Vaping status: Never Used  Substance and Sexual Activity   Alcohol use: No   Drug use: Yes    Types: Marijuana    Comment: last used Christmas   Sexual activity: Never  Other Topics Concern   Not on file  Social History Narrative   Not on file   Social Determinants of Health   Financial Resource Strain: Not on file  Food Insecurity: Not on file  Transportation Needs: Not on file  Physical  Activity: Not on file  Stress: Not on file  Social Connections: Unknown (02/02/2023)   [facility]   Social Network    Social Network: Not on file     Family History: The patient's family history includes Atrial fibrillation in her father and mother; Breast cancer in her mother; Diabetes in her father; Heart failure in her father and mother. There is no history of Colon cancer, Stomach cancer, Esophageal cancer, Pancreatic cancer, or Liver disease.  ROS:   Please see the history of present illness.     All other systems reviewed and are negative.  EKGs/Labs/Other Studies Reviewed:    The following studies were reviewed today:   EKG:   12/18/21: sinus bradycardia, rate 52, LBBB 11/10/2022: Sinus bradycardia, rate 59, left axis deviation, LVH, 1 mm ST depressions in V4-6  Recent Labs: 08/01/2022: B Natriuretic Peptide 283.2; Hemoglobin 15.2; Platelets 398 11/10/2022: Magnesium 1.9 02/09/2023: ALT QUANTITY NOT SUFFICIENT, UNABLE TO PERFORM TEST;  BUN 15; Creatinine, Ser 0.92; Potassium 4.3; Sodium 136; TSH 2.717  Recent Lipid Panel    Component Value Date/Time   CHOL 173 06/08/2017 0819   TRIG 124 06/22/2020 0935   HDL 40 (L) 06/08/2017 0819   CHOLHDL 4.3 06/08/2017 0819   VLDL 23 06/08/2017 0819   LDLCALC 110 (H) 06/08/2017 0819    Physical Exam:    VS:  BP 100/72 (BP Location: Left Arm, Patient Position: Sitting, Cuff Size: Normal)   Pulse 61   Ht 5\' 8"  (1.727 m)   Wt 256 lb 9.6 oz (116.4 kg)   SpO2 97%   BMI 39.02 kg/m     Wt Readings from Last 3 Encounters:  03/05/23 256 lb 9.6 oz (116.4 kg)  02/09/23 254 lb (115.2 kg)  11/10/22 249 lb 3.2 oz (113 kg)     GEN:  Well nourished, well developed in no acute distress HEENT: Normal NECK: No JVD; No carotid bruits CARDIAC: RRR, no murmurs, rubs, gallops RESPIRATORY:  Clear to auscultation without rales, wheezing or rhonchi  ABDOMEN: Soft, non-tender, non-distended MUSCULOSKELETAL:  trace edema; No deformity  SKIN: Warm and dry NEUROLOGIC:  Alert and oriented x 3 PSYCHIATRIC:  Normal affect   ASSESSMENT:    1. Persistent atrial fibrillation (HCC)   2. Chronic systolic heart failure (HCC)   3. Coronary artery aneurysm   4. Obesity (BMI 30-39.9)   5. Essential hypertension      PLAN:    Suspected coronary aneurysm: preablation CT was notable for 2.2 x 1.8 cm mass adjacent to right coronary ostium with evidence of vascularity.  Differential included thrombosed coronary aneurysm, saccular thrombosed aortic aneurysm, or mediastinal soft tissue mass.  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  LHC 12/25/2021 showed normal coronary arteries, no connection to mass seen.  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  Coronary CTA 09/2022 showed similar appearance of suspected RCA thrombosed aneurysm, measuring 2.1 x 2.2 x 1.9 cm, grossly  unchanged from prior study. -Suspected aneurysm is stable in size, will continue to monitor  Persistent atrial fibrillation: Diagnosed after admission with diverticulitis 05/2020.  Failed DCCV 07/2020.  Loaded with Tikosyn and underwent DCCV 05/2021 but had early return of A-fib.  DCCV 07/2021 also complicated by early return of A-fib.  Switched to amiodarone.  Underwent DCCV 08/01/2022.  Currently on amiodarone 100 mg daily, appears to be maintaining sinus rhythm.  On Eliquis 5  mg twice daily for anticoagulation.  On Toprol-XL 50 mg daily.  Follows with Dr. Lalla Brothers in EP  Chronic systolic heart failure: Echocardiogram 04/07/2021 with EF 30 to 35%, global hypokinesis.  Echocardiogram 09/10/2021 EF 40 to 45%, global hypokinesis.  Cardiac MRI 11/14/2021 EF 46%, septal lateral dyssynchrony consistent with LBBB, no myocardial LGE.  Suspect tachycardia induced versus due to left bundle branch block.  No CAD on cath 12/2021.  Echocardiogram 11/2022 showed EF had improved to 50 to 55% -Continue Entresto 24-26 mg twice daily -Continue Toprol-XL 50 mg daily -Continue Lasix 20 mg as needed.  Check CMET, magnesium  OSA: On CPAP, reports has had issues tolerating mask, has not been using.  Has appointment with sleep medicine next month  Hypertension: On Entresto, Toprol-XL.  Appears controlled  Tobacco use: Counseled of risk of tobacco use and cessation strongly encouraged.  She has significantly cut back on her smoking, down to about 1 pack/month, will sometimes go days between cigarettes.  Encourage complete cessation  Obesity: Body mass index is 39.02 kg/m.  Refer to [facility] wellness   RTC in 6 months  Medication Adjustments/Labs and Tests Ordered: Current medicines are reviewed at length with the patient today.  Concerns regarding medicines are outlined above.  Orders Placed This Encounter  Procedures   Comprehensive metabolic panel   Magnesium   Amb Ref to [facility]   No  orders of the defined types were placed in this encounter.   Patient Instructions  Medication Instructions:  Your physician recommends that you continue on your current medications as directed. Please refer to the Current Medication list given to you today.  *If you need a refill on your cardiac medications before your next appointment, please call your pharmacy*  Lab Work: Your physician recommends that you have lab work today- CMET and Mg  If you have labs (blood work) drawn today and your tests are completely normal, you will receive your results only by: MyChart Message (if you have MyChart) OR A paper copy in the mail If you have any lab test that is abnormal or we need to change your treatment, we will call you to review the results.  Testing/Procedures: None ordered today.  Follow-Up: At [facility], you and your health needs are our priority.  As part of our continuing mission to provide you with exceptional heart care, we have created designated Provider Care Teams.  These Care Teams include your primary Cardiologist (physician) and Advanced Practice Providers (APPs -  Physician Assistants and Nurse Practitioners) who all work together to provide you with the care you need, when you need it.  We recommend signing up for the patient portal called "MyChart".  Sign up information is provided on this After Visit Summary.  MyChart is used to connect with patients for Virtual Visits (Telemedicine).  Patients are able to view lab/test results, encounter notes, upcoming appointments, etc.  Non-urgent messages can be sent to your provider as well.   To learn more about what you can do with MyChart, go to [email or web address].    Your next appointment:   6 month(s)  Provider:   Little Ishikawa, MD     You have been referred to [facility]. Their office will call you.    Signed, Little Ishikawa, MD  03/05/2023 8:49 AM    [facility]

## 2023-03-05 ENCOUNTER — Ambulatory Visit: Payer: Commercial Managed Care - PPO | Attending: Cardiology | Admitting: Cardiology

## 2023-03-05 ENCOUNTER — Encounter: Payer: Self-pay | Admitting: Cardiology

## 2023-03-05 VITALS — BP 100/72 | HR 61 | Ht 68.0 in | Wt 256.6 lb

## 2023-03-05 DIAGNOSIS — I5022 Chronic systolic (congestive) heart failure: Secondary | ICD-10-CM | POA: Diagnosis not present

## 2023-03-05 DIAGNOSIS — E669 Obesity, unspecified: Secondary | ICD-10-CM

## 2023-03-05 DIAGNOSIS — I4819 Other persistent atrial fibrillation: Secondary | ICD-10-CM | POA: Diagnosis not present

## 2023-03-05 DIAGNOSIS — I2541 Coronary artery aneurysm: Secondary | ICD-10-CM | POA: Diagnosis not present

## 2023-03-05 DIAGNOSIS — I10 Essential (primary) hypertension: Secondary | ICD-10-CM

## 2023-03-05 NOTE — Patient Instructions (Addendum)
Medication Instructions:  Your physician recommends that you continue on your current medications as directed. Please refer to the Current Medication list given to you today.  *If you need a refill on your cardiac medications before your next appointment, please call your pharmacy*  Lab Work: Your physician recommends that you have lab work today- CMET and Mg  If you have labs (blood work) drawn today and your tests are completely normal, you will receive your results only by: MyChart Message (if you have MyChart) OR A paper copy in the mail If you have any lab test that is abnormal or we need to change your treatment, we will call you to review the results.  Testing/Procedures: None ordered today.  Follow-Up: At [facility], you and your health needs are our priority.  As part of our continuing mission to provide you with exceptional heart care, we have created designated Provider Care Teams.  These Care Teams include your primary Cardiologist (physician) and Advanced Practice Providers (APPs -  Physician Assistants and Nurse Practitioners) who all work together to provide you with the care you need, when you need it.  We recommend signing up for the patient portal called "MyChart".  Sign up information is provided on this After Visit Summary.  MyChart is used to connect with patients for Virtual Visits (Telemedicine).  Patients are able to view lab/test results, encounter notes, upcoming appointments, etc.  Non-urgent messages can be sent to your provider as well.   To learn more about what you can do with MyChart, go to [email or web address].    Your next appointment:   6 month(s)  Provider:   Little Ishikawa, MD     You have been referred to [facility]. Their office will call you.

## 2023-03-06 LAB — MAGNESIUM: Magnesium: 1.8 mg/dL (ref 1.6–2.3)

## 2023-03-06 LAB — COMPREHENSIVE METABOLIC PANEL
ALT: 13 [IU]/L (ref 0–32)
AST: 13 [IU]/L (ref 0–40)
Albumin: 4.1 g/dL (ref 3.9–4.9)
Alkaline Phosphatase: 125 [IU]/L — ABNORMAL HIGH (ref 44–121)
BUN/Creatinine Ratio: 14 (ref 12–28)
BUN: 13 mg/dL (ref 8–27)
Bilirubin Total: 0.3 mg/dL (ref 0.0–1.2)
CO2: 25 mmol/L (ref 20–29)
Calcium: 9.5 mg/dL (ref 8.7–10.3)
Chloride: 105 mmol/L (ref 96–106)
Creatinine, Ser: 0.96 mg/dL (ref 0.57–1.00)
Globulin, Total: 2.3 g/dL (ref 1.5–4.5)
Glucose: 96 mg/dL (ref 70–99)
Potassium: 4.6 mmol/L (ref 3.5–5.2)
Sodium: 142 mmol/L (ref 134–144)
Total Protein: 6.4 g/dL (ref 6.0–8.5)
eGFR: 67 mL/min/{1.73_m2} (ref >59)

## 2023-03-31 ENCOUNTER — Ambulatory Visit: Payer: Commercial Managed Care - PPO | Admitting: Cardiovascular Disease

## 2023-05-17 ENCOUNTER — Telehealth: Payer: Self-pay | Admitting: Cardiology

## 2023-05-17 MED ORDER — APIXABAN 5 MG PO TABS: 5.0000 mg | ORAL_TABLET | Freq: Two times a day (BID) | ORAL | 1 refills | Status: DC

## 2023-05-17 NOTE — Telephone Encounter (Signed)
*  STAT* If patient is at the pharmacy, call can be transferred to refill team.   1. Which medications need to be refilled? (please list name of each medication and dose if known)    apixaban (ELIQUIS) 5 MG TABS tablet    4. Which pharmacy/location (including street and city if local pharmacy) is medication to be sent to? [facility] - [place], [place] - [address]     5. Do they need a 30 day or 90 day supply? 90

## 2023-05-17 NOTE — Telephone Encounter (Signed)
Pt last saw Dr Bjorn Pippin 03/05/23, last labs 03/05/23 Creat 0.96, age 62, weight 116.4kg, based on specified criteria pt is on appropriate dosage of Eliquis 5mg  BID for afib.  Will refill rx.

## 2023-05-31 ENCOUNTER — Ambulatory Visit: Payer: Commercial Managed Care - PPO | Admitting: Cardiovascular Disease

## 2023-08-10 ENCOUNTER — Ambulatory Visit (HOSPITAL_COMMUNITY): Payer: Commercial Managed Care - PPO | Admitting: Physician Assistant

## 2023-08-21 ENCOUNTER — Emergency Department (HOSPITAL_BASED_OUTPATIENT_CLINIC_OR_DEPARTMENT_OTHER)

## 2023-08-21 ENCOUNTER — Other Ambulatory Visit: Payer: Self-pay

## 2023-08-21 ENCOUNTER — Emergency Department (HOSPITAL_BASED_OUTPATIENT_CLINIC_OR_DEPARTMENT_OTHER)
Admission: EM | Admit: 2023-08-21 | Discharge: 2023-08-21 | Disposition: A | Discharge status: Home / Self Care | Attending: Emergency Medicine | Admitting: Emergency Medicine

## 2023-08-21 ENCOUNTER — Encounter (HOSPITAL_BASED_OUTPATIENT_CLINIC_OR_DEPARTMENT_OTHER): Payer: Self-pay

## 2023-08-21 DIAGNOSIS — Z7901 Long term (current) use of anticoagulants: Secondary | ICD-10-CM | POA: Diagnosis not present

## 2023-08-21 DIAGNOSIS — F17200 Nicotine dependence, unspecified, uncomplicated: Secondary | ICD-10-CM | POA: Insufficient documentation

## 2023-08-21 DIAGNOSIS — I110 Hypertensive heart disease with heart failure: Secondary | ICD-10-CM | POA: Insufficient documentation

## 2023-08-21 DIAGNOSIS — Z79899 Other long term (current) drug therapy: Secondary | ICD-10-CM | POA: Diagnosis not present

## 2023-08-21 DIAGNOSIS — I4891 Unspecified atrial fibrillation: Secondary | ICD-10-CM | POA: Diagnosis present

## 2023-08-21 DIAGNOSIS — I509 Heart failure, unspecified: Secondary | ICD-10-CM | POA: Insufficient documentation

## 2023-08-21 LAB — CBC
HCT: 49.7 % — ABNORMAL HIGH (ref 36.0–46.0)
Hemoglobin: 16.2 g/dL — ABNORMAL HIGH (ref 12.0–15.0)
MCH: 28.7 pg (ref 26.0–34.0)
MCHC: 32.6 g/dL (ref 30.0–36.0)
MCV: 88.1 fL (ref 80.0–100.0)
Platelets: 283 10*3/uL (ref 150–400)
RBC: 5.64 MIL/uL — ABNORMAL HIGH (ref 3.87–5.11)
RDW: 15 % (ref 11.5–15.5)
WBC: 7.9 10*3/uL (ref 4.0–10.5)
nRBC: 0 % (ref 0.0–0.2)

## 2023-08-21 LAB — BASIC METABOLIC PANEL WITH GFR
Anion gap: 11 (ref 5–15)
BUN: 17 mg/dL (ref 8–23)
CO2: 23 mmol/L (ref 22–32)
Calcium: 9.6 mg/dL (ref 8.9–10.3)
Chloride: 105 mmol/L (ref 98–111)
Creatinine, Ser: 1.07 mg/dL — ABNORMAL HIGH (ref 0.44–1.00)
GFR, Estimated: 59 mL/min — ABNORMAL LOW (ref >60)
Glucose, Bld: 176 mg/dL — ABNORMAL HIGH (ref 70–99)
Potassium: 3.6 mmol/L (ref 3.5–5.1)
Sodium: 139 mmol/L (ref 135–145)

## 2023-08-21 MED ORDER — METOPROLOL TARTRATE 5 MG/5ML IV SOLN
5.0000 mg | Freq: Once | INTRAVENOUS | Status: AC
  Administered 2023-08-21: 5 mg via INTRAVENOUS
  Filled 2023-08-21: qty 5

## 2023-08-21 NOTE — ED Provider Notes (Addendum)
 [place] EMERGENCY DEPARTMENT AT [facility] Provider Note   CSN: [id number] Arrival date & time: 08/21/23  1919     History  Chief Complaint  Patient presents with   Atrial Fibrillation    Tammy Davies is a 63 y.o. female.  Patient with known history of atrial fibrillation.  Patient on amiodarone Eliquis and Toprol XL.  Followed by cardiology.  Patient thinks for the past 2 days that her heart rates been fast although she cannot really tell but she sort of just feels always feels a little lightheaded feels a little fatigued a little short of breath.  Patient normally works overnight shift.  And she felt worse when she got to work.  No chest pain with it.  Patient is an everyday smoker.  Past history significant for heart failure hypertension and as we mentioned the persistent atrial fibrillation's had a gallbladder removed.       Home Medications Prior to Admission medications   Medication Sig Start Date End Date Taking? Authorizing Provider  acetaminophen (TYLENOL) 500 MG tablet Take 1,000 mg by mouth every 6 (six) hours as needed for mild pain, fever or headache.    [provider]  amiodarone (PACERONE) 200 MG tablet Take 0.5 tablets (100 mg total) by mouth daily. 02/09/23   Fenton, Clint R, PA  apixaban (ELIQUIS) 5 MG TABS tablet Take 1 tablet (5 mg total) by mouth 2 (two) times daily. 05/17/23   Little Ishikawa, MD  ENTRESTO 24-26 MG Take 1 tablet by mouth twice daily 01/12/23   Tereso Newcomer T, PA-C  furosemide (LASIX) 20 MG tablet Take 1 tablet (20 mg total) by mouth daily as needed for fluid or edema (As needed for weight gain of 3 lb overnight or 5 lb in one week). 11/10/22   Little Ishikawa, MD  Magnesium 250 MG TABS Take 250 mg by mouth daily.    [provider]  metoprolol succinate (TOPROL-XL) 50 MG 24 hr tablet Take 1 tablet (50 mg total) by mouth daily. 11/10/22   Little Ishikawa, MD  potassium chloride (KLOR-CON)  10 MEQ tablet Take 1 tablet (10 mEq total) by mouth daily. 09/17/22   Tereso Newcomer T, PA-C  rosuvastatin (CRESTOR) 5 MG tablet Take 5 mg by mouth daily. 10/21/22   [provider]  Vitamin D, Ergocalciferol, (DRISDOL) 1.25 MG (50000 UNIT) CAPS capsule Take 50,000 Units by mouth once a week. 10/21/22   [provider]      Allergies    Heparin    Review of Systems   Review of Systems  Constitutional:  Positive for fatigue. Negative for chills and fever.  HENT:  Negative for ear pain and sore throat.   Eyes:  Negative for pain and visual disturbance.  Respiratory:  Positive for shortness of breath. Negative for cough.   Cardiovascular:  Positive for palpitations. Negative for chest pain.  Gastrointestinal:  Negative for abdominal pain and vomiting.  Genitourinary:  Negative for dysuria and hematuria.  Musculoskeletal:  Negative for arthralgias and back pain.  Skin:  Negative for color change and rash.  Neurological:  Negative for seizures and syncope.  All other systems reviewed and are negative.   Physical Exam Updated Vital Signs BP (!) 143/117 (BP Location: Right Arm)   Pulse (!) 54   Temp 98.5 F (36.9 C) (Oral)   Resp 15   SpO2 95%  Physical Exam Vitals and nursing note reviewed.  Constitutional:      General:  She is not in acute distress.    Appearance: She is well-developed.  HENT:     Head: Normocephalic and atraumatic.     Mouth/Throat:     Mouth: Mucous membranes are moist.  Eyes:     Conjunctiva/sclera: Conjunctivae normal.  Cardiovascular:     Rate and Rhythm: Tachycardia present. Rhythm irregular.     Heart sounds: No murmur heard. Pulmonary:     Effort: Pulmonary effort is normal. No respiratory distress.     Breath sounds: Normal breath sounds. No wheezing or rales.  Abdominal:     Palpations: Abdomen is soft.     Tenderness: There is no abdominal tenderness.  Musculoskeletal:        General: No swelling.     Cervical back: Neck supple.   Skin:    General: Skin is warm and dry.     Capillary Refill: Capillary refill takes less than 2 seconds.  Neurological:     General: No focal deficit present.     Mental Status: She is alert and oriented to person, place, and time.  Psychiatric:        Mood and Affect: Mood normal.     ED Results / Procedures / Treatments   Labs (all labs ordered are listed, but only abnormal results are displayed) Labs Reviewed  BASIC METABOLIC PANEL WITH GFR - Abnormal; Notable for the following components:      Result Value   Glucose, Bld 176 (*)    Creatinine, Ser 1.07 (*)    GFR, Estimated 59 (*)    All other components within normal limits  CBC - Abnormal; Notable for the following components:   RBC 5.64 (*)    Hemoglobin 16.2 (*)    HCT 49.7 (*)    All other components within normal limits    EKG EKG Interpretation Date/Time:  Saturday August 21 2023 19:34:45 EDT Ventricular Rate:  137 PR Interval:    QRS Duration:  106 QT Interval:  314 QTC Calculation: 474 R Axis:   -51  Text Interpretation: Atrial fibrillation with rapid ventricular response Left axis deviation Incomplete right bundle branch block Moderate voltage criteria for LVH, may be normal variant ( R in aVL , Cornell product ) Marked ST abnormality, possible inferior subendocardial injury Abnormal ECG When compared with ECG of 09-Feb-2023 08:35, Significant changes have occurred Confirmed by Vanetta Mulders [phone]) on 08/21/2023 9:11:06 PM  Radiology DG Chest Port 1 View Result Date: 08/21/2023 CLINICAL DATA:  Atrial fibrillation. EXAM: PORTABLE CHEST 1 VIEW COMPARISON:  08/01/2022 FINDINGS: The heart is upper normal in size, stable.The cardiomediastinal contours are normal. The lungs are clear. Pulmonary vasculature is normal. No consolidation, pleural effusion, or pneumothorax. No acute osseous abnormalities are seen. IMPRESSION: No active disease. Electronically Signed   By: Narda Rutherford M.D.   On: 08/21/2023 20:45     Procedures Procedures    Medications Ordered in ED Medications  metoprolol tartrate (LOPRESSOR) injection 5 mg (has no administration in time range)    ED Course/ Medical Decision Making/ A&P                                 Medical Decision Making Amount and/or Complexity of Data Reviewed Labs: ordered. Radiology: ordered.  Risk Prescription drug management.   Patient's heart rate ranges anywhere from the 90s up to 130.  But pretty consistently kind of in the 0100-0110 range.  Patient's blood pressure checked  by me 111/74.  No significant chest pain.  Chest x-ray negative CBC GFR down little bit at 59 DBC hemoglobin 16.2 white count 7.9 platelets 283.  Based on this with the rate not being very rapid even though she is a candidate for cardioversion.  Will go and just try a little extra beta-blocker.  So we will try some Lopressor.  Patient feels much better with the Lopressor.  Heart rate down into the 90s.  Still in atrial fibs with controlled.   Final Clinical Impression(s) / ED Diagnoses Final diagnoses:  Atrial fibrillation with RVR [facility])    Rx / DC Orders ED Discharge Orders     None         Vanetta Mulders, MD 08/21/23 2124    Vanetta Mulders, MD 08/21/23 2229

## 2023-08-21 NOTE — ED Triage Notes (Signed)
 Pt presents via POV c/o feels like she has tachycardia from atrial fib. Reports hx of same. Reports on Eliquis. A&O x4. Ambulatory to triage.

## 2023-08-21 NOTE — Discharge Instructions (Signed)
 Work note provided.  Return for any persistent heart rate being fast.  Continue current medications.  Make an appointment to follow-up with cardiology.

## 2023-08-23 ENCOUNTER — Telehealth (HOSPITAL_COMMUNITY): Payer: Self-pay

## 2023-08-23 NOTE — Telephone Encounter (Signed)
 Tried to contact patient to schedule ED follow up appointment. Patient's mail box is full. I am not able to leave a message.

## 2023-09-04 ENCOUNTER — Encounter (HOSPITAL_BASED_OUTPATIENT_CLINIC_OR_DEPARTMENT_OTHER): Payer: Self-pay | Admitting: Emergency Medicine

## 2023-09-04 ENCOUNTER — Other Ambulatory Visit: Payer: Self-pay

## 2023-09-04 ENCOUNTER — Emergency Department (HOSPITAL_BASED_OUTPATIENT_CLINIC_OR_DEPARTMENT_OTHER)

## 2023-09-04 ENCOUNTER — Emergency Department (HOSPITAL_BASED_OUTPATIENT_CLINIC_OR_DEPARTMENT_OTHER)
Admission: EM | Admit: 2023-09-04 | Discharge: 2023-09-05 | Disposition: A | Discharge status: Home / Self Care | Attending: Emergency Medicine | Admitting: Emergency Medicine

## 2023-09-04 DIAGNOSIS — I4892 Unspecified atrial flutter: Secondary | ICD-10-CM | POA: Diagnosis not present

## 2023-09-04 DIAGNOSIS — N179 Acute kidney failure, unspecified: Secondary | ICD-10-CM | POA: Diagnosis not present

## 2023-09-04 DIAGNOSIS — R002 Palpitations: Secondary | ICD-10-CM | POA: Diagnosis present

## 2023-09-04 DIAGNOSIS — Z7901 Long term (current) use of anticoagulants: Secondary | ICD-10-CM | POA: Diagnosis not present

## 2023-09-04 LAB — CBC
HCT: 48.6 % — ABNORMAL HIGH (ref 36.0–46.0)
Hemoglobin: 15.7 g/dL — ABNORMAL HIGH (ref 12.0–15.0)
MCH: 28.6 pg (ref 26.0–34.0)
MCHC: 32.3 g/dL (ref 30.0–36.0)
MCV: 88.7 fL (ref 80.0–100.0)
Platelets: 282 10*3/uL (ref 150–400)
RBC: 5.48 MIL/uL — ABNORMAL HIGH (ref 3.87–5.11)
RDW: 15.7 % — ABNORMAL HIGH (ref 11.5–15.5)
WBC: 8.8 10*3/uL (ref 4.0–10.5)
nRBC: 0 % (ref 0.0–0.2)

## 2023-09-04 LAB — BASIC METABOLIC PANEL WITH GFR
Anion gap: 8 (ref 5–15)
BUN: 23 mg/dL (ref 8–23)
CO2: 24 mmol/L (ref 22–32)
Calcium: 9.6 mg/dL (ref 8.9–10.3)
Chloride: 105 mmol/L (ref 98–111)
Creatinine, Ser: 1.64 mg/dL — ABNORMAL HIGH (ref 0.44–1.00)
GFR, Estimated: 35 mL/min — ABNORMAL LOW (ref >60)
Glucose, Bld: 101 mg/dL — ABNORMAL HIGH (ref 70–99)
Potassium: 4.1 mmol/L (ref 3.5–5.1)
Sodium: 137 mmol/L (ref 135–145)

## 2023-09-04 LAB — TROPONIN I (HIGH SENSITIVITY): Troponin I (High Sensitivity): 5 ng/L (ref <18)

## 2023-09-04 LAB — MAGNESIUM: Magnesium: 2.1 mg/dL (ref 1.7–2.4)

## 2023-09-04 MED ORDER — ETOMIDATE 2 MG/ML IV SOLN
10.0000 mg | Freq: Once | INTRAVENOUS | Status: AC
  Administered 2023-09-04: 10 mg via INTRAVENOUS
  Filled 2023-09-04: qty 10

## 2023-09-04 MED ORDER — FENTANYL CITRATE PF 50 MCG/ML IJ SOSY
50.0000 ug | PREFILLED_SYRINGE | Freq: Once | INTRAMUSCULAR | Status: AC
  Administered 2023-09-04: 50 ug via INTRAVENOUS
  Filled 2023-09-04: qty 1

## 2023-09-04 MED ORDER — ONDANSETRON HCL 4 MG/2ML IJ SOLN
4.0000 mg | Freq: Once | INTRAMUSCULAR | Status: AC
  Administered 2023-09-04: 4 mg via INTRAVENOUS
  Filled 2023-09-04: qty 2

## 2023-09-04 NOTE — ED Triage Notes (Signed)
 Patient here POV stating she feels like her heart rate is elevated. Hx of afib and anticoagulated w/ Eliquis . States yesterday at work she felt dizzy and short of breath and this is usually how she feels when her a fib is acting up. Appears flushed in triage.

## 2023-09-04 NOTE — ED Notes (Signed)
 22:30 RT to bedside for Cardioversion. Placed on 2L supplemental oxygen . Vital signs: HR 135 Afib, RR 22 BP 94/69 SpO2 96% EtCO2 31. BVM hooked to wall source. Suction and emergency equipment readily available at bedside.  22:49 Post procedure VS: HR 63 NSR, RR 20 BP 143/87 SpO2 97% (back to room air), EtCO2 36. Patient awake and can communicate upon RT exit.

## 2023-09-04 NOTE — Discharge Instructions (Addendum)
 As we discussed, you had a cardioversion for atrial flutter.  I recommend you continue taking your Eliquis  and amiodarone  and Lopressor  and also your current meds  You need to call Dr. Jesse Moritz office for follow-up  Your kidney function is slightly abnormal I recommend you recheck it with your doctor in a week  Return to ER if you have worse chest pain or shortness of breath or dizziness

## 2023-09-04 NOTE — ED Provider Notes (Signed)
 [place] EMERGENCY DEPARTMENT AT [facility] Provider Note   CSN: [id number] Arrival date & time: 09/04/23  2129     History  Chief Complaint  Patient presents with   Dizziness   Shortness of Breath    Tammy Davies is a 63 y.o. female history of A-fib on Eliquis , here presenting with palpitations and shortness of breath.  Patient states that she has been having lightheaded and dizziness since yesterday.  She also has some shortness of breath and subjective palpitations.  Patient states that she came to the ER about 2 weeks ago and was found to be in A-fib.  She states that she is compliant with her Eliquis  and last took it this morning.  She is also taking her amiodarone  and metoprolol .  Patient states that she had a cardioversion done about a year ago and has been in sinus rhythm for the last year.  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  The history is provided by the patient.       Home Medications Prior to Admission medications   Medication Sig Start Date End Date Taking? Authorizing Provider  acetaminophen  (TYLENOL ) 500 MG tablet Take 1,000 mg by mouth every 6 (six) hours as needed for mild pain, fever or headache.    [provider]  amiodarone  (PACERONE ) 200 MG tablet Take 0.5 tablets (100 mg total) by mouth daily. 02/09/23   Fenton, Clint R, PA  apixaban  (ELIQUIS ) 5 MG TABS tablet Take 1 tablet (5 mg total) by mouth 2 (two) times daily. 05/17/23   Wendie Hamburg, MD  ENTRESTO  24-26 MG Take 1 tablet by mouth twice daily 01/12/23   Marlyse Single T, PA-C  furosemide  (LASIX ) 20 MG tablet Take 1 tablet (20 mg total) by mouth daily as needed for fluid or edema (As needed for weight gain of 3 lb overnight or 5 lb in one week). 11/10/22   Wendie Hamburg, MD  Magnesium  250 MG TABS Take 250 mg by mouth daily.    [provider]  metoprolol  succinate (TOPROL -XL) 50 MG 24 hr tablet Take 1 tablet (50 mg total) by mouth daily. 11/10/22   Wendie Hamburg, MD  potassium chloride  (KLOR-CON ) 10 MEQ tablet Take 1 tablet (10 mEq total) by mouth daily. 09/17/22   Marlyse Single T, PA-C  rosuvastatin (CRESTOR) 5 MG tablet Take 5 mg by mouth daily. 10/21/22   [provider]  Vitamin D , Ergocalciferol , (DRISDOL ) 1.25 MG (50000 UNIT) CAPS capsule Take 50,000 Units by mouth once a week. 10/21/22   [provider]      Allergies    Heparin     Review of Systems   Review of Systems  Respiratory:  Positive for shortness of breath.   Cardiovascular:  Positive for palpitations.  Neurological:  Positive for dizziness.  All other systems reviewed and are negative.   Physical Exam Updated Vital Signs BP 112/88   Pulse 66   Temp 98 F (36.7 C)   Resp 19   Ht 5\' 8"  (1.727 m)   Wt 116 kg   SpO2 96%   BMI 38.88 kg/m  Physical Exam Vitals and nursing note reviewed.  Constitutional:      Appearance: She is well-developed.  HENT:     Head: Normocephalic.     Mouth/Throat:     Mouth: Mucous membranes  are moist.  Eyes:     Extraocular Movements: Extraocular movements intact.     Pupils: Pupils are equal, round, and reactive to light.  Cardiovascular:     Rate and Rhythm: Tachycardia present. Rhythm irregular.  Pulmonary:     Effort: Pulmonary effort is normal.     Breath sounds: Normal breath sounds.  Abdominal:     Palpations: Abdomen is soft.     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     Cervical back: Normal range of motion and neck supple.  Skin:    General: Skin is warm.     Capillary Refill: Capillary refill takes less than 2 seconds.  Neurological:     General: No focal deficit present.     Mental Status: She is alert and oriented to person, place, and time.  Psychiatric:        Mood and Affect: Mood  normal.        Behavior: Behavior normal.    ED Results / Procedures / Treatments   Labs (all labs ordered are listed, but only abnormal results are displayed) Labs Reviewed  CBC - Abnormal; Notable for the following components:      Result Value   RBC 5.48 (*)    Hemoglobin 15.7 (*)    HCT 48.6 (*)    RDW 15.7 (*)    All other components within normal limits  BASIC METABOLIC PANEL WITH GFR  MAGNESIUM   TROPONIN I (HIGH SENSITIVITY)    EKG EKG Interpretation Date/Time:  Saturday September 04 2023 22:44:20 EDT Ventricular Rate:  61 PR Interval:  182 QRS Duration:  141 QT Interval:  367 QTC Calculation: 370 R Axis:   -50  Text Interpretation: Sinus rhythm Probable left atrial enlargement Left bundle branch block Baseline wander in lead(s) II aVF last tracing showed aflutter earlier in the day Confirmed by Florette Hurry ([id number]) on 09/04/2023 10:58:03 PM  Radiology DG Chest Davies 1 View Result Date: 09/04/2023 CLINICAL DATA:  Elevated heart with dizziness and shortness of breath. EXAM: PORTABLE CHEST 1 VIEW COMPARISON:  August 21, 2023 FINDINGS: The heart size and mediastinal contours are within normal limits. Mild, diffuse, chronic appearing increased lung markings are noted. Mild atelectatic changes are seen within the bilateral lung bases. No pleural effusion or pneumothorax is identified. Multilevel degenerative changes seen throughout the thoracic spine. IMPRESSION: Chronic appearing increased lung markings with mild bibasilar atelectasis. Electronically Signed   By: Virgle Grime M.D.   On: 09/04/2023 22:59    Procedures .Sedation  Date/Time: 09/04/2023 11:01 PM  Performed by: Dalene Duck, MD Authorized by: Dalene Duck, MD   Consent:    Consent obtained:  Written (electronic informed consent)   Risks discussed:  Allergic reaction, dysrhythmia, inadequate sedation, nausea, vomiting, respiratory compromise necessitating ventilatory assistance and intubation,  prolonged sedation necessitating reversal and prolonged hypoxia resulting in organ damage Universal protocol:    Procedure explained and questions answered to patient or proxy's satisfaction: yes     Relevant documents present and verified: yes     Test results available: yes     Imaging studies available: yes     Required blood products, implants, devices, and special equipment available: yes     Immediately prior to procedure, a time out was called: yes     Patient identity confirmed:  Arm band Pre-sedation assessment:    Time since last food or drink:  7 hours   ASA classification: class 1 - normal, healthy patient     Mallampati score:  I - soft palate, uvula, fauces, pillars visible   Pre-sedation assessments completed and reviewed: airway patency and mental status   A pre-sedation assessment was completed prior to the start of the procedure Immediate pre-procedure details:    Reassessment: Patient reassessed immediately prior to procedure     Reviewed: vital signs, relevant labs/tests and NPO status     Verified: bag valve mask available, emergency equipment available, intubation equipment available, IV patency confirmed, oxygen  available, reversal medications available and suction available   Procedure details (see MAR for exact dosages):    Sedation:  Etomidate    Intended level of sedation: deep   Intra-procedure monitoring:  Blood pressure monitoring, continuous pulse oximetry, cardiac monitor, frequent vital sign checks and frequent LOC assessments   Total Provider sedation time (minutes):  30 Post-procedure details:   A post-sedation assessment was completed following the completion of the procedure.   Attendance: Constant attendance by certified staff until patient recovered     Recovery: Patient returned to pre-procedure baseline     Post-sedation assessments completed and reviewed: airway patency, cardiovascular function, hydration status, mental status and respiratory  function     Patient is stable for discharge or admission: yes     Procedure completion:  Tolerated well, no immediate complications .Cardioversion  Date/Time: 09/04/2023 11:02 PM  Performed by: Dalene Duck, MD Authorized by: Dalene Duck, MD   Consent:    Consent obtained:  Written   Consent given by:  Patient   Risks discussed:  Cutaneous burn, death and induced arrhythmia   Alternatives discussed:  No treatment Pre-procedure details:    Cardioversion basis:  Emergent   Rhythm:  Atrial flutter Patient sedated: Yes. Refer to sedation procedure documentation for details of sedation.  Attempt one:    Cardioversion mode:  Synchronous   Waveform:  Biphasic   Shock (Joules):  200   Shock outcome:  Conversion to normal sinus rhythm     Medications Ordered in ED Medications  fentaNYL  (SUBLIMAZE ) injection 50 mcg (50 mcg Intravenous Given 09/04/23 2241)  etomidate  (AMIDATE ) injection 10 mg (10 mg Intravenous Given 09/04/23 2242)    ED Course/ Medical Decision Making/ A&P                                 Medical Decision Making LARAYA PESTKA is a 63 y.o. female here presenting with palpitation and dizziness and shortness of breath.  Patient had similar symptoms when she had rapid A-fib.  Patient is in rapid A-fib on arrival.  Discussed options including IV metoprolol  and IV Cardizem  to help control her heart rate versus cardioversion.  Patient had cardioversion multiple times previously with good results.  I discussed risks and benefits of cardioversion and patient decided to proceed with cardioversion with conscious sedation.  11:03 PM Cardioversion was successful and patient is back in sinus rhythm.  11:21 PM Labs are reviewed and she had mild AKI with creatinine 1.6.  I wonder if this is secondary to her A-fib.  Chest x-ray shows some congestion.  Patient is not nauseated right now so we will give some Zofran  and p.o. trial.  Dissipate that patient will be  discharged home and recommend that she continue her Eliquis  and her amiodarone  and Lopressor .  I also recommend that she follows with cardiology outpatient.   Problems  Addressed: AKI (acute kidney injury) [facility]): acute illness or injury Atrial flutter, unspecified type [facility]): chronic illness or injury  Amount and/or Complexity of Data Reviewed Labs: ordered. Decision-making details documented in ED Course. Radiology: ordered and independent interpretation performed. Decision-making details documented in ED Course. ECG/medicine tests: ordered and independent interpretation performed. Decision-making details documented in ED Course.  Risk Prescription drug management.    Final Clinical Impression(s) / ED Diagnoses Final diagnoses:  None    Rx / DC Orders ED Discharge Orders     None         Dalene Duck, MD 09/04/23 2322

## 2023-09-04 NOTE — Sedation Documentation (Signed)
 Pt shocked at synchronized 200J

## 2023-09-15 NOTE — ED Notes (Signed)
 Aldrete score 10

## 2023-09-25 ENCOUNTER — Other Ambulatory Visit: Payer: Self-pay | Admitting: Physician Assistant

## 2023-10-19 ENCOUNTER — Ambulatory Visit: Attending: Emergency Medicine | Admitting: Emergency Medicine

## 2023-10-19 ENCOUNTER — Other Ambulatory Visit (HOSPITAL_COMMUNITY): Payer: Self-pay

## 2023-10-19 ENCOUNTER — Telehealth: Payer: Self-pay | Admitting: Pharmacy Technician

## 2023-10-19 ENCOUNTER — Telehealth: Payer: Self-pay | Admitting: *Deleted

## 2023-10-19 ENCOUNTER — Encounter: Payer: Self-pay | Admitting: Emergency Medicine

## 2023-10-19 VITALS — BP 104/68 | HR 99 | Ht 68.0 in | Wt 272.0 lb

## 2023-10-19 DIAGNOSIS — I10 Essential (primary) hypertension: Secondary | ICD-10-CM

## 2023-10-19 DIAGNOSIS — I4819 Other persistent atrial fibrillation: Secondary | ICD-10-CM

## 2023-10-19 DIAGNOSIS — I5022 Chronic systolic (congestive) heart failure: Secondary | ICD-10-CM | POA: Diagnosis not present

## 2023-10-19 DIAGNOSIS — I480 Paroxysmal atrial fibrillation: Secondary | ICD-10-CM

## 2023-10-19 DIAGNOSIS — G4733 Obstructive sleep apnea (adult) (pediatric): Secondary | ICD-10-CM

## 2023-10-19 DIAGNOSIS — I2541 Coronary artery aneurysm: Secondary | ICD-10-CM | POA: Diagnosis not present

## 2023-10-19 MED ORDER — APIXABAN 5 MG PO TABS: 5.0000 mg | ORAL_TABLET | Freq: Two times a day (BID) | ORAL | 0 refills | Status: AC

## 2023-10-19 NOTE — Patient Instructions (Addendum)
 Medication Instructions:  NO CHANGES    Lab Work: BMET TO BE DONE TODAY.  Testing/Procedures: Your physician has requested that you have an echocardiogram. Echocardiography is a painless test that uses sound waves to create images of your heart. It provides your doctor with information about the size and shape of your heart and how well your heart's chambers and valves are working. This procedure takes approximately one hour. There are no restrictions for this procedure. Please do NOT wear cologne, perfume, aftershave, or lotions (deodorant is allowed). Please arrive 15 minutes prior to your appointment time.  Please note: We ask at that you not bring children with you during ultrasound (echo/ vascular) testing. Due to room size and safety concerns, children are not allowed in the ultrasound rooms during exams. Our front office staff cannot provide observation of children in our lobby area while testing is being conducted. An adult accompanying a patient to their appointment will only be allowed in the ultrasound room at the discretion of the ultrasound technician under special circumstances. We apologize for any inconvenience.   Follow-Up: At [facility], you and your health needs are our priority.  As part of our continuing mission to provide you with exceptional heart care, our providers are all part of one team.  This team includes your primary Cardiologist (physician) and Advanced Practice Providers or APPs (Physician Assistants and Nurse Practitioners) who all work together to provide you with the care you need, when you need it.  Your next appointment:   3 WEEKS  Provider:   MADISON FOUNTAIN, DNP  NEED TO SEE AFIB CLINIC WITHIN THE NEXT WEEK.

## 2023-10-19 NOTE — Telephone Encounter (Signed)
 Encounters made for assistance. Provider aware. Lmom for patient

## 2023-10-19 NOTE — Telephone Encounter (Signed)
   10.00 for 90 days now getting ready at  [facility]. Losartan  is on our 5.00 list for 30 days or its 5.30 on her ins for 30 days. Sent provider message and lmom for patient

## 2023-10-19 NOTE — Progress Notes (Signed)
 Cardiology Office Note:    Date:  10/19/2023  ID:  Tammy Davies, DOB [birth year], MRN [id number] PCP: Patient, No Pcp Per  [facility] Providers Cardiologist:  Wendie Hamburg, MD Electrophysiologist:  Boyce Byes, MD       Patient Profile:       Chief Complaint: 45-month follow-up for atrial fibrillation History of Present Illness:  Tammy Davies is a 63 y.o. 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  Course complicated by persistent atrial fibrillation/flutter.  She was referred to atrial fibrillation clinic and ultimately underwent DCCV x 2 on 06/13/2021 and 07/07/2021.  She was subsequently started on Tikosyn .  She was admitted 08/02/2021 with recurrent rapid atrial fibrillation.  Tikosyn  was discontinued and she was loaded with amiodarone .  Atrial fibrillation/flutter ablation was planned with Dr. Marven Slimmer on 09/29/2021.  However preablation CT was notable for a 2.2 x 1.8 cm mass adjacent to the right coronary ostium with evidence of vascularity.  Differential includes thrombosed coronary aneurysm, saccular thrombosed aortic aneurysm, mediastinal soft tissue mass.  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  Cardiac catheterization on 12/25/2021 showed normal coronary arteries, no connection to mass seen.  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  Echocardiogram 11/2022 showed EF 50 to 55%, normal RV function, no  valvular disease.  Patient presented to the ED on 08/21/2023 in atrial fibrillation.  Her rates were controlled between 100-110.  Patient was given metoprolol  tartrate with improvement in HR.  Patient was discharged home in rate controlled A-fib.  Patient was recently seen in the ED on 09/04/2023.  She presented with palpitations, shortness of breath, lightheadedness, dizziness.  Patient was in rapid atrial fibrillation on arrival.  Patient underwent successful DCCV with conversion to sinus rhythm.  Her creatinine was found to be 1.64 and GFR 35.   Discussed the use of AI scribe software for clinical note transcription with the patient, who gave verbal consent to proceed.  History of Present Illness Tammy Davies is a 63 year old female with atrial fibrillation, atrial flutter, and coronary artery aneurysm who presents with shortness of breath and recurrent atrial fibrillation/flutter.  Today she notes she is not doing well overall.  EKG today shows that she is back in atrial fibrillation.  She experiences shortness of breath for about a week, particularly when climbing stairs and rolling over in bed. Her weight has increased from 256 during her last OV on 02/2023 to 272 pounds today, but she does not feel swollen.  Denies any orthopnea or PND.  Is having some mild DOE and denies SOB at rest.  She notes she is entirely unsure of when she went back in atrial fibrillation.  She does note that her heart rate has been well-controlled since her ED visit.  Noting home HR readings to be in the 60s to 70s.  However she notes she did not become symptomatic with SOB until last week.  She has reduced her medication intake due to insurance/financial issues, taking Eliquis  once daily instead of twice and reducing Entresto  to only once daily.  She has applied for assistance programs but has not received a response. She remains compliant with her other medications as they are much cheaper. She has just used her  last Entresto  pill today and is concerned about medication costs.  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  Her last kidney function test showed a creatinine level of 1.64 and a GFR of 35, a decrease from earlier in the year.  She denies any syncope, palpitations, tachycardia, hematochezia, melena, chest pains.  Review of systems:  Please see the history of present illness. All other systems are reviewed and otherwise negative.      Studies Reviewed:    EKG Interpretation Date/Time:  Tuesday October 19 2023 08:08:21 EDT Ventricular Rate:  99 PR Interval:    QRS Duration:  136 QT Interval:  352 QTC Calculation: 451 R Axis:   -35  Text Interpretation: Atrial fibrillation Left axis deviation Left ventricular hypertrophy with QRS widening ( R in aVL , Cornell product ) Cannot rule out Septal infarct , age undetermined When compared with ECG of 04-Sep-2023 22:44, PREVIOUS ECG IS PRESENT Confirmed by Palmer Bobo [phone]) on 10/19/2023 10:26:11 AM    Echocardiogram 12/09/2022  1. Left ventricular ejection fraction, by estimation, is 50 to 55%. The  left ventricle has low normal function. The left ventricle has no regional  wall motion abnormalities. There is mild concentric left ventricular  hypertrophy. Left ventricular  diastolic parameters are consistent with Grade I diastolic dysfunction  (impaired relaxation).   2. Right ventricular systolic function is normal. The right ventricular  size is normal. Tricuspid regurgitation signal is inadequate for assessing  PA pressure.   3. The mitral valve is grossly normal. Trivial mitral valve  regurgitation. No evidence of mitral stenosis.   4. The aortic valve is grossly normal. Aortic valve regurgitation is not  visualized. No aortic stenosis is present.   5. The inferior vena cava is normal in size with greater than 50%  respiratory variability, suggesting right atrial pressure of 3 mmHg.   Risk  Assessment/Calculations:    CHA2DS2-VASc Score = 3   This indicates a 3.2% annual risk of stroke. The patient's score is based upon: CHF History: 1 HTN History: 1 Diabetes History: 0 Stroke History: 0 Vascular Disease History: 0 Age Score: 0 Gender Score: 1             Physical Exam:   VS:  BP 104/68 (BP Location: Left Arm, Patient Position: Sitting, Cuff Size: Normal)   Pulse 99   Ht 5\' 8"  (1.727 m)   Wt 272 lb (123.4 kg)   BMI 41.36 kg/m    Wt Readings from Last 3 Encounters:  10/19/23 272 lb (123.4 kg)  09/04/23 255 lb 11.7 oz (116 kg)  03/05/23 256 lb 9.6 oz (116.4 kg)    GEN: Well nourished, well developed in no acute distress NECK: No JVD; No carotid bruits CARDIAC: Irregularly irregular rhythm, no murmurs, rubs, gallops RESPIRATORY:  Clear to auscultation without rales, wheezing or rhonchi  ABDOMEN: Soft, non-tender, non-distended EXTREMITIES:  No edema; No acute deformity      Assessment and Plan:  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  LHC on 12/2021 showed normal coronary arteries, no connection to mass seen.  PET scan 01/2022 showed no abnormal  FDG avidity and mass consistent with benign etiology, suspect thrombosed aneurysm.  Coronary CTA 09/2022 showed similar appearance of suspected RCA thrombosed aneurysm, measuring 2.1 x 2.2 x 1.9 cm grossly unchanged from prior study - Today she denies any anginal symptoms - The suspected aneurysm is stable in size per most recent coronary CTA 09/2022, we will continue to clinically monitor  Persistent atrial fibrillation/flutter Diagnosed after admission with diverticulitis on 05/2020 and failed DCCV on 07/2020.  Loaded with Tikosyn  underwent DCCV on 05/2021 but had early return of A-fib.  DCCV 07/2021 also complicated by  early return of A-fib.  Ultimately switched to amiodarone  and underwent DCCV on 07/2022 that was successful. Ultimately ablation was canceled due to thrombosed RCA aneurysm Found to be back in A-fib on 08/21/2023 with elevated heart rate, given metoprolol  with improvement in heart rate.  Found in atrial flutter on 09/04/2023 and underwent successful DCCV in the ED and D/C in NS[facility] - Today patient's EKG shows atrial fibrillation with HR 99 bpm with Afib return after recent DCCV.  The patient is unsure of the time she had reverted back overall has been well rather than mild DOE/leg swelling over the past week - She has only been taking her Eliquis  once daily and not as prescribed due to financial/insurance issues - I gave patient Eliquis  samples today and have pharmacy reach out for medication assistance.  Much education given on adherence to OAC - Will have her schedule follow-up appointment with atrial fibrillation clinic for further recommendations - BMET 08/2023 while in ED shows AKI. Will repeat BMET today  - Continue Eliquis  5 mg twice daily and Toprol -XL 50 mg daily  Chronic systolic heart failure Echocardiogram 03/2021 with LVEF 30 to 35% Echocardiogram 08/2021 with LVEF 40 to 45% Cardiac MRI 10/2021 with LVEF 46%, septal lateral dyssynchrony consistent with LBBB Echocardiogram 11/2022 showed improved LVEF of 50 to 55% - Today patient c/o mild DOE and lower extremity swelling over the past week.  No orthopnea or PND.  Weight today is up 16 pounds compared to previous OV 02/2023.  Has not been taking her as needed Lasix .  Now back in persistent atrial fibrillation with return after successful DCCV. - Has only been taking her Entresto  once daily rather than prescribed.  I am having pharmacy reach out to her regarding assistance - Most likely her A-fib is exacerbating her heart failure.  However she is rate controlled today and reports HR in the 60s over the past week.  Plan to repeat echocardiogram to  reassess LV function and refer back to A-fib clinic.  Will consider further GDMT optimization after echocardiogram  - Will reassess BMET as creatinine was 1.64 and GFR 35 on 09/04/2023 which was a drastic decrease from prior labs prior to starting Lasix  daily - Continue Entresto  24-26 mg twice daily  Obstructive sleep apnea Not using CPAP currently due to issues tolerating mask.  Notes she tries to put it on at night but always wakes up with it off - She will need to see sleep medicine for further recommendations  Hypertension Blood pressure today is 104/68 and well-controlled - Continue Entresto  24-26 mg twice daily and metoprolol  XL 50 mg daily      Dispo:  Return in about 3 weeks (around 11/09/2023).  Signed, Ava Boatman, NP

## 2023-10-19 NOTE — Telephone Encounter (Signed)
 I just spoke to the patient and she said she can afford the eliquis  at 20.00 a month and the entresto  at 10.00 for 90 days. she will pick those up today from [facility]   Provider aware

## 2023-10-19 NOTE — Telephone Encounter (Signed)
  She has already used her free trial the coupon offers. 20.00 on ins for 30 days. 20.00 for xarelto for 30 days. 10.00 for generic pradaxa for 30 days. Sent provider message. Lmom for patient

## 2023-10-19 NOTE — Telephone Encounter (Signed)
 Patient received samples for eliquis . No samples available for entresto .  Patient assistance team is helping and they are aware.

## 2023-10-19 NOTE — Telephone Encounter (Addendum)
 Medication name/dosage: Samples List: Eliquis  5 mg and Entresto  24/26 twice daily.  Administration instructions:   Reason for samples: Reason for samples: unable to afford medication  Ordering provider: [facility].  *Once above information entered, route the phone encounter to CV DIV MAG ST SAMPLES and send Teams message to team member assigned to Samples for the day.

## 2023-10-20 ENCOUNTER — Ambulatory Visit: Payer: Self-pay | Admitting: Emergency Medicine

## 2023-10-20 LAB — BASIC METABOLIC PANEL WITH GFR
BUN/Creatinine Ratio: 14 (ref 12–28)
BUN: 14 mg/dL (ref 8–27)
CO2: 20 mmol/L (ref 20–29)
Calcium: 9.5 mg/dL (ref 8.7–10.3)
Chloride: 104 mmol/L (ref 96–106)
Creatinine, Ser: 1 mg/dL (ref 0.57–1.00)
Glucose: 82 mg/dL (ref 70–99)
Potassium: 4.8 mmol/L (ref 3.5–5.2)
Sodium: 139 mmol/L (ref 134–144)
eGFR: 64 mL/min/{1.73_m2} (ref >59)

## 2023-10-28 ENCOUNTER — Ambulatory Visit (HOSPITAL_COMMUNITY)
Admission: RE | Admit: 2023-10-28 | Discharge: 2023-10-28 | Disposition: A | Discharge status: Home / Self Care | Source: Ambulatory Visit | Attending: Physician Assistant | Admitting: Physician Assistant

## 2023-10-28 ENCOUNTER — Ambulatory Visit (HOSPITAL_COMMUNITY): Payer: Self-pay | Admitting: Physician Assistant

## 2023-10-28 ENCOUNTER — Telehealth: Payer: Self-pay | Admitting: Internal Medicine

## 2023-10-28 ENCOUNTER — Encounter (HOSPITAL_COMMUNITY): Payer: Self-pay | Admitting: Physician Assistant

## 2023-10-28 VITALS — BP 150/100 | HR 130 | Ht 68.0 in | Wt 268.8 lb

## 2023-10-28 DIAGNOSIS — I10 Essential (primary) hypertension: Secondary | ICD-10-CM

## 2023-10-28 DIAGNOSIS — I4819 Other persistent atrial fibrillation: Secondary | ICD-10-CM | POA: Diagnosis not present

## 2023-10-28 DIAGNOSIS — D6869 Other thrombophilia: Secondary | ICD-10-CM

## 2023-10-28 DIAGNOSIS — Z79899 Other long term (current) drug therapy: Secondary | ICD-10-CM

## 2023-10-28 DIAGNOSIS — Z5181 Encounter for therapeutic drug level monitoring: Secondary | ICD-10-CM | POA: Diagnosis not present

## 2023-10-28 LAB — COMPREHENSIVE METABOLIC PANEL WITH GFR
ALT: 14 IU/L (ref 0–32)
AST: 14 IU/L (ref 0–40)
Albumin: 4 g/dL (ref 3.9–4.9)
Alkaline Phosphatase: 135 IU/L — ABNORMAL HIGH (ref 44–121)
BUN/Creatinine Ratio: 16 (ref 12–28)
BUN: 17 mg/dL (ref 8–27)
Bilirubin Total: 0.4 mg/dL (ref 0.0–1.2)
CO2: 21 mmol/L (ref 20–29)
Calcium: 9.2 mg/dL (ref 8.7–10.3)
Chloride: 105 mmol/L (ref 96–106)
Creatinine, Ser: 1.07 mg/dL — ABNORMAL HIGH (ref 0.57–1.00)
Globulin, Total: 2.3 g/dL (ref 1.5–4.5)
Glucose: 179 mg/dL — ABNORMAL HIGH (ref 70–99)
Potassium: 4.2 mmol/L (ref 3.5–5.2)
Sodium: 140 mmol/L (ref 134–144)
Total Protein: 6.3 g/dL (ref 6.0–8.5)
eGFR: 59 mL/min/{1.73_m2} — ABNORMAL LOW (ref >59)

## 2023-10-28 LAB — CBC
Hematocrit: 49.9 % — ABNORMAL HIGH (ref 34.0–46.6)
Hemoglobin: 16 g/dL — ABNORMAL HIGH (ref 11.1–15.9)
MCH: 28.8 pg (ref 26.6–33.0)
MCHC: 32.1 g/dL (ref 31.5–35.7)
MCV: 90 fL (ref 79–97)
Platelets: 243 10*3/uL (ref 150–450)
RBC: 5.55 x10E6/uL — ABNORMAL HIGH (ref 3.77–5.28)
RDW: 15.9 % — ABNORMAL HIGH (ref 11.7–15.4)
WBC: 7.1 10*3/uL (ref 3.4–10.8)

## 2023-10-28 NOTE — Patient Instructions (Addendum)
 Increase metoprolol  50mg  twice a day until day  of cardioversion then decrease to 50mg   daily after cardioversion   Increase Amiodarone  to 200mg  daily the day of cardioversion       Cardioversion scheduled for: June 24 Tuesday   - Arrive at the [facility] ([address]  and check in with ADMITTING at 9:30   - Do not eat or drink anything after midnight the night prior to your procedure.   - Take all your morning medication (except diabetic medications) with a sip of water  prior to arrival.  - Do NOT miss any doses of your blood thinner - if you should miss a dose or take a dose more than 4 hours late -- please notify our office immediately.  - You will not be able to drive home after your procedure. Please ensure you have a responsible adult to drive you home. You will need someone with you for 24 hours post procedure.     - Expect to be in the procedural area approximately 2 hours.   - If you feel as if you go back into normal rhythm prior to scheduled cardioversion, please notify our office immediately.   If your procedure is canceled in the cardioversion suite you will be charged a cancellation fee.   Follow up Dr. Marven Slimmer  the office will call

## 2023-10-28 NOTE — Progress Notes (Signed)
 Primary Care Physician: Patient, No Pcp Per Referring Physician: Vin Bhagat Cardiologist: Dr. Alda Amas Primary EP: Dr Norva Beecham is a 63 y.o. 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  She was found to have persistent afib/flutter and was on Cardizem  IV, transitioned to oral prior to d/c. She was on heparin  for a while perioperatively but transitioned back to eliquis  prior to d/c. She was also positive for Covid on admission but asymptomatic and has had all vaccines.   She was seen by Vin Bhagat, PA, and still had RVR in the 120's and he added low dose BB to her daily Cardizem . However, pt misunderstood and stopped her CCB, still in atrial flutter(typical) today at 127 bpm. She states that she does not feel her heart rhythm and is tolerating this well. She expects to have a reverse colostomy in 6 months.   F/u in the afib clinic, 07/26/20. At time of cardioversion , she went from atrail flutter with 3-4 AVB, to afib with RVR at 160 bpm. She eas cardioverted 2 additional times but did not convert. She was given esmolol  per anesthesiology and  was d/c in afib in the 90's. The ekg shows today that she remains in afib at 71 bpm.she states that the afib is not bothering her and she feels well. She is pending having her colonoscopy reversed in June. She continues on BB/CCB for heart rate control.   F/u in afib clinic, 03/04/21. Initially, she thought that she may have  colostomy reversed in June, however this did not occur. She was admitted in June with abdominal pain and terminal ileitis. She asked for this appointment as she has been feeling more short of breath. In SR with controlled v rate today. She mentions now that reversal may not be until January if at all, as the surgeon told her she has a lot of scar tissue.  She would like to try to restore SR. She has not noted any fluid retention. Her weight is up 12 lbs. She feels this represents true weight gain. She has cut back smoking to one pack a week. When she was in the hospital in June, she was told that she needed a sleep study as she had snoring and  desaturation in her O2 with sleep.   04/16/21, Pt is here to f/u up on echo that showed moderately reduced EF at 30-35%. I  discussed with Dr. Felipe Horton and he thought probably [facility] and would try to restore sinus rhythm to improve EF, medication adjustment to address EFdysfunction and after restoring SR, he will consider further if ischemic w/u is needed. Weight is up a few lbs, but she states not fluid. I cannot appreciate any fluid and pt states that her diet is terrible. She has cut back but continues to smoke. She actually states that her breathing is improved as compared when she asked to be seen in October. She is agreeable to coming into hospital for Tikosyn  but wants to wait until early January.   Follow up in the AF clinic 05/26/21. Patient presents for dofetilide  admission. She denies any missed doses of anticoagulation in the last 3 weeks.   Follow up in the AF clinic 06/05/21. Patient is s/p dofetilide  loading 1/9-1/12/23 with DCCV on 05/28/21. She reports that she felt great for 3 days after leaving the  hospital. Unfortunately, she started feeling dyspnea on exertion on the fourth day and ECG shows afib today. She is very disappointed she is back in afib. There were no specific triggers that she could identify.   Follow up in the AF clinic 08/05/21. Patient is s/p DCCV x 2 on 06/13/21 and 07/15/21. She was admitted 08/02/21 with recorrence of her rapid afib. Dofetilide  was discontinued and she was loaded on amiodarone  which converted her to SR. She is scheduled for an afib and flutter ablation with Dr Marven Slimmer on 09/29/21. She reports that she feels well today, no CP or increased SOB. She is on CPAP now.   Follow up  in the AF clinic 08/13/22. Patient was scheduled for ablation with Dr Marven Slimmer but she was found to have a mass around the RCA origin. Ablation was cancelled. 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 She was seen by Dr Marven Slimmer 07/28/22 and her amiodarone  was decreased. She presented to the ED 08/01/22 with dizziness and SOB. ECG demonstrated atrial flutter and she underwent DCCV at that time. There were no specific triggers that she could identify. She is in SR today and feeling well.   Follow up in the AF clinic 02/09/23. Patient reports that she has done well since her last visit. She has not had any interim symptoms of afib. No bleeding issues on anticoagulation.   Follow up 10/28/23. Patient returns for follow up for atrial fibrillation and amiodarone  monitoring. Patient was found to be back in afib at her follow up on 6/3. She reports that she had noticed increased fatigue and dyspnea with exertion for the past two months. Unfortunately, she changed insurance companies and had only been taking Eliquis  once daily due to a gap in coverage. She has been taking twice daily since 6/3.  Today, she  denies symptoms of palpitations, chest pain, orthopnea, PND, lower extremity edema, dizziness, presyncope, syncope, snoring, daytime somnolence, bleeding, or neurologic sequela. The patient is tolerating medications without difficulties and is otherwise without complaint today.    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     amiodarone  (PACERONE ) 200 MG tablet Take 0.5 tablets (100 mg total) by mouth daily.     apixaban  (ELIQUIS ) 5 MG TABS tablet Take 1 tablet (5 mg total) by mouth 2 (two) times daily. 180 tablet 1   ENTRESTO  24-26 MG Take 1 tablet by mouth twice daily 180 tablet 3   furosemide  (LASIX ) 20 MG tablet Take 1 tablet (20 mg total) by mouth daily as needed for fluid or edema (As needed for weight gain of 3 lb overnight or 5 lb in one week).  90 tablet 2   Magnesium  250 MG TABS Take 250 mg by mouth daily.     metoprolol  succinate (TOPROL -XL) 50 MG 24 hr tablet Take 1 tablet (50 mg total) by mouth daily. 90 tablet 3   potassium chloride  (KLOR-CON ) 10 MEQ tablet Take 1 tablet by mouth once daily 30 tablet 5   rosuvastatin (CRESTOR) 5 MG tablet Take 5 mg by mouth daily.     Vitamin D , Ergocalciferol , (DRISDOL ) 1.25 MG (50000 UNIT) CAPS capsule Take 50,000 Units by mouth once a week.     apixaban  (ELIQUIS ) 5 MG TABS tablet Take 1 tablet (5 mg total) by mouth 2 (two) times daily. 28 tablet 0   No current facility-administered medications for this encounter.    ROS- All systems are reviewed and negative except as per the HPI above  Physical Exam: Vitals:   10/28/23 0956  BP: (!) 150/100  Pulse: (!) 130  Weight: 121.9 kg  Height: 5' 8 (1.727 m)    Wt Readings from Last 3 Encounters:  10/28/23 121.9 kg  10/19/23 123.4 kg  09/04/23 116 kg    GEN: Well nourished, well developed in no acute distress NECK: No JVD CARDIAC: Irregularly irregular rate and rhythm, no murmurs, rubs, gallops RESPIRATORY:  Clear to auscultation without rales, wheezing or rhonchi  ABDOMEN:  Soft, non-tender, non-distended EXTREMITIES:  No edema; No deformity     EKG today demonstrates Afib with RVR, LBBB Vent. rate 130 BPM PR interval * ms QRS duration 134 ms QT/QTcB 342/503 ms   Echo 12/09/22  1. Left ventricular ejection fraction, by estimation, is 50 to 55%. The  left ventricle has low normal function. The left ventricle has no regional  wall motion abnormalities. There is mild concentric left ventricular  hypertrophy. Left ventricular diastolic parameters are consistent with Grade I diastolic dysfunction (impaired relaxation).   2. Right ventricular systolic function is normal. The right ventricular  size is normal. Tricuspid regurgitation signal is inadequate for assessing  PA pressure.   3. The mitral valve is grossly normal. Trivial mitral valve  regurgitation. No evidence of mitral stenosis.   4. The aortic valve is grossly normal. Aortic valve regurgitation is not  visualized. No aortic stenosis is present.   5. The inferior vena cava is normal in size with greater than 50%  respiratory variability, suggesting right atrial pressure of 3 mmHg.   Comparison(s): Changes from prior study are noted. The left ventricular  function has improved.    CHA2DS2-VASc Score = 3  The patient's score is based upon: CHF History: 1 HTN History: 1 Diabetes History: 0 Stroke History: 0 Vascular Disease History: 0 Age Score: 0 Gender Score: 1       ASSESSMENT AND PLAN: Persistent Atrial Fibrillation/atrial flutter The patient's CHA2DS2-VASc score is 3, indicating a 3.2% annual risk of stroke.   Patient in persistent afib, symptomatic Previously failed dofetilide .  We discussed rhythm control options. Will plan for DCCV after 3 weeks of uninterrupted anticoagulation (6/24). Check cmet/TSH/cbc today Will increase amiodarone  to 200 mg on the day of DCCV to avoid premature chemical conversion.  Increase Toprol  to 50 mg BID until DCCV, then decrease back to once daily.   ? If she would be a candidate for ablation with advent of PFA, patient interested in discussing with Dr Marven Slimmer.   Secondary Hypercoagulable State (ICD10:  D68.69) The patient is at significant risk for stroke/thromboembolism based upon her CHA2DS2-VASc Score of 3.  Continue Apixaban  (Eliquis ). No bleeding issues.  High Risk Medication Monitoring (ICD 10: Z79.899) Intervals on ECG acceptable for amiodarone  monitoring. Check cmet/TSH today.     HFrecEF EF 50-55% GDMT per primary cardiology team Fluid status appears stable today  HTN Elevated today, increase BB as above. Will reassess in SR.   Obesity Body mass index is 40.87 kg/m.  Encouraged lifestyle modification  OSA  Encouraged nightly CPAP    Follow up with Dr Marven Slimmer post DCCV.    Informed Consent   Shared Decision Making/Informed Consent The risks (stroke, cardiac arrhythmias rarely resulting in the need for a temporary or permanent pacemaker, skin irritation or burns and complications associated with conscious sedation including aspiration, arrhythmia, respiratory failure and death), benefits (restoration of normal sinus rhythm) and alternatives of a direct current cardioversion were explained in detail to Ms. Serano and she agrees to proceed.       Myrtha Ates PA-C [facility] [address] [place], [place] [address] [phone]

## 2023-10-28 NOTE — Telephone Encounter (Unsigned)
 Copied from CRM [id number]. Topic: Clinical - Medication Refill >> Oct 28, 2023 12:44 PM Marlan Silva wrote: Medication: Vitamin D , Ergocalciferol , (DRISDOL ) 1.25 MG (50000 UNIT) CAPS capsule  Has the patient contacted their pharmacy? Yes (Agent: If no, request that the patient contact the pharmacy for the refill. If patient does not wish to contact the pharmacy document the reason why and proceed with request.) (Agent: If yes, when and what did the pharmacy advise?)  This is the patient's preferred pharmacy:  [facility] [address] [place] - [address] [place] [address] Phone: [phone] Fax: [phone]  Is this the correct pharmacy for this prescription? Yes If no, delete pharmacy and type the correct one.   Has the prescription been filled recently? Yes  Is the patient out of the medication? Yes  Has the patient been seen for an appointment in the last year OR does the patient have an upcoming appointment? Yes  Can we respond through MyChart? Yes  Agent: Please be advised that Rx refills may take up to 3 business days. We ask that you follow-up with your pharmacy.

## 2023-10-28 NOTE — Telephone Encounter (Signed)
 Pt has submitted a refill request for the Vitamin D  (Drisdol ) 50,000 units/weekly.   She has a new pt appt. On 12/13/2023 at 8:20 with Gavin Kast, NP at [facility] at [facility].    She is requesting a refill until that upcoming appt.  I attempted to call her twice to let her know they would not be able to refill her rx until she was established as a pt.  I left her a voicemail to call the office at [phone] if she had any questions.

## 2023-10-28 NOTE — H&P (View-Only) (Signed)
 Primary Care Physician: Patient, No Pcp Per Referring Physician: Vin Bhagat Cardiologist: Dr. Alda Amas Primary EP: Dr Norva Beecham is a 63 y.o. 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  She was found to have persistent afib/flutter and was on Cardizem  IV, transitioned to oral prior to d/c. She was on heparin  for a while perioperatively but transitioned back to eliquis  prior to d/c. She was also positive for Covid on admission but asymptomatic and has had all vaccines.   She was seen by Vin Bhagat, PA, and still had RVR in the 120's and he added low dose BB to her daily Cardizem . However, pt misunderstood and stopped her CCB, still in atrial flutter(typical) today at 127 bpm. She states that she does not feel her heart rhythm and is tolerating this well. She expects to have a reverse colostomy in 6 months.   F/u in the afib clinic, 07/26/20. At time of cardioversion , she went from atrail flutter with 3-4 AVB, to afib with RVR at 160 bpm. She eas cardioverted 2 additional times but did not convert. She was given esmolol  per anesthesiology and  was d/c in afib in the 90's. The ekg shows today that she remains in afib at 71 bpm.she states that the afib is not bothering her and she feels well. She is pending having her colonoscopy reversed in June. She continues on BB/CCB for heart rate control.   F/u in afib clinic, 03/04/21. Initially, she thought that she may have  colostomy reversed in June, however this did not occur. She was admitted in June with abdominal pain and terminal ileitis. She asked for this appointment as she has been feeling more short of breath. In SR with controlled v rate today. She mentions now that reversal may not be until January if at all, as the surgeon told her she has a lot of scar tissue.  She would like to try to restore SR. She has not noted any fluid retention. Her weight is up 12 lbs. She feels this represents true weight gain. She has cut back smoking to one pack a week. When she was in the hospital in June, she was told that she needed a sleep study as she had snoring and  desaturation in her O2 with sleep.   04/16/21, Pt is here to f/u up on echo that showed moderately reduced EF at 30-35%. I  discussed with Dr. Felipe Horton and he thought probably [facility] and would try to restore sinus rhythm to improve EF, medication adjustment to address EFdysfunction and after restoring SR, he will consider further if ischemic w/u is needed. Weight is up a few lbs, but she states not fluid. I cannot appreciate any fluid and pt states that her diet is terrible. She has cut back but continues to smoke. She actually states that her breathing is improved as compared when she asked to be seen in October. She is agreeable to coming into hospital for Tikosyn  but wants to wait until early January.   Follow up in the AF clinic 05/26/21. Patient presents for dofetilide  admission. She denies any missed doses of anticoagulation in the last 3 weeks.   Follow up in the AF clinic 06/05/21. Patient is s/p dofetilide  loading 1/9-1/12/23 with DCCV on 05/28/21. She reports that she felt great for 3 days after leaving the  hospital. Unfortunately, she started feeling dyspnea on exertion on the fourth day and ECG shows afib today. She is very disappointed she is back in afib. There were no specific triggers that she could identify.   Follow up in the AF clinic 08/05/21. Patient is s/p DCCV x 2 on 06/13/21 and 07/15/21. She was admitted 08/02/21 with recorrence of her rapid afib. Dofetilide  was discontinued and she was loaded on amiodarone  which converted her to SR. She is scheduled for an afib and flutter ablation with Dr Marven Slimmer on 09/29/21. She reports that she feels well today, no CP or increased SOB. She is on CPAP now.   Follow up  in the AF clinic 08/13/22. Patient was scheduled for ablation with Dr Marven Slimmer but she was found to have a mass around the RCA origin. Ablation was cancelled. 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 She was seen by Dr Marven Slimmer 07/28/22 and her amiodarone  was decreased. She presented to the ED 08/01/22 with dizziness and SOB. ECG demonstrated atrial flutter and she underwent DCCV at that time. There were no specific triggers that she could identify. She is in SR today and feeling well.   Follow up in the AF clinic 02/09/23. Patient reports that she has done well since her last visit. She has not had any interim symptoms of afib. No bleeding issues on anticoagulation.   Follow up 10/28/23. Patient returns for follow up for atrial fibrillation and amiodarone  monitoring. Patient was found to be back in afib at her follow up on 6/3. She reports that she had noticed increased fatigue and dyspnea with exertion for the past two months. Unfortunately, she changed insurance companies and had only been taking Eliquis  once daily due to a gap in coverage. She has been taking twice daily since 6/3.  Today, she  denies symptoms of palpitations, chest pain, orthopnea, PND, lower extremity edema, dizziness, presyncope, syncope, snoring, daytime somnolence, bleeding, or neurologic sequela. The patient is tolerating medications without difficulties and is otherwise without complaint today.    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     amiodarone  (PACERONE ) 200 MG tablet Take 0.5 tablets (100 mg total) by mouth daily.     apixaban  (ELIQUIS ) 5 MG TABS tablet Take 1 tablet (5 mg total) by mouth 2 (two) times daily. 180 tablet 1   ENTRESTO  24-26 MG Take 1 tablet by mouth twice daily 180 tablet 3   furosemide  (LASIX ) 20 MG tablet Take 1 tablet (20 mg total) by mouth daily as needed for fluid or edema (As needed for weight gain of 3 lb overnight or 5 lb in one week).  90 tablet 2   Magnesium  250 MG TABS Take 250 mg by mouth daily.     metoprolol  succinate (TOPROL -XL) 50 MG 24 hr tablet Take 1 tablet (50 mg total) by mouth daily. 90 tablet 3   potassium chloride  (KLOR-CON ) 10 MEQ tablet Take 1 tablet by mouth once daily 30 tablet 5   rosuvastatin (CRESTOR) 5 MG tablet Take 5 mg by mouth daily.     Vitamin D , Ergocalciferol , (DRISDOL ) 1.25 MG (50000 UNIT) CAPS capsule Take 50,000 Units by mouth once a week.     apixaban  (ELIQUIS ) 5 MG TABS tablet Take 1 tablet (5 mg total) by mouth 2 (two) times daily. 28 tablet 0   No current facility-administered medications for this encounter.    ROS- All systems are reviewed and negative except as per the HPI above  Physical Exam: Vitals:   10/28/23 0956  BP: (!) 150/100  Pulse: (!) 130  Weight: 121.9 kg  Height: 5' 8 (1.727 m)    Wt Readings from Last 3 Encounters:  10/28/23 121.9 kg  10/19/23 123.4 kg  09/04/23 116 kg    GEN: Well nourished, well developed in no acute distress NECK: No JVD CARDIAC: Irregularly irregular rate and rhythm, no murmurs, rubs, gallops RESPIRATORY:  Clear to auscultation without rales, wheezing or rhonchi  ABDOMEN:  Soft, non-tender, non-distended EXTREMITIES:  No edema; No deformity     EKG today demonstrates Afib with RVR, LBBB Vent. rate 130 BPM PR interval * ms QRS duration 134 ms QT/QTcB 342/503 ms   Echo 12/09/22  1. Left ventricular ejection fraction, by estimation, is 50 to 55%. The  left ventricle has low normal function. The left ventricle has no regional  wall motion abnormalities. There is mild concentric left ventricular  hypertrophy. Left ventricular diastolic parameters are consistent with Grade I diastolic dysfunction (impaired relaxation).   2. Right ventricular systolic function is normal. The right ventricular  size is normal. Tricuspid regurgitation signal is inadequate for assessing  PA pressure.   3. The mitral valve is grossly normal. Trivial mitral valve  regurgitation. No evidence of mitral stenosis.   4. The aortic valve is grossly normal. Aortic valve regurgitation is not  visualized. No aortic stenosis is present.   5. The inferior vena cava is normal in size with greater than 50%  respiratory variability, suggesting right atrial pressure of 3 mmHg.   Comparison(s): Changes from prior study are noted. The left ventricular  function has improved.    CHA2DS2-VASc Score = 3  The patient's score is based upon: CHF History: 1 HTN History: 1 Diabetes History: 0 Stroke History: 0 Vascular Disease History: 0 Age Score: 0 Gender Score: 1       ASSESSMENT AND PLAN: Persistent Atrial Fibrillation/atrial flutter The patient's CHA2DS2-VASc score is 3, indicating a 3.2% annual risk of stroke.   Patient in persistent afib, symptomatic Previously failed dofetilide .  We discussed rhythm control options. Will plan for DCCV after 3 weeks of uninterrupted anticoagulation (6/24). Check cmet/TSH/cbc today Will increase amiodarone  to 200 mg on the day of DCCV to avoid premature chemical conversion.  Increase Toprol  to 50 mg BID until DCCV, then decrease back to once daily.   ? If she would be a candidate for ablation with advent of PFA, patient interested in discussing with Dr Marven Slimmer.   Secondary Hypercoagulable State (ICD10:  D68.69) The patient is at significant risk for stroke/thromboembolism based upon her CHA2DS2-VASc Score of 3.  Continue Apixaban  (Eliquis ). No bleeding issues.  High Risk Medication Monitoring (ICD 10: Z79.899) Intervals on ECG acceptable for amiodarone  monitoring. Check cmet/TSH today.     HFrecEF EF 50-55% GDMT per primary cardiology team Fluid status appears stable today  HTN Elevated today, increase BB as above. Will reassess in SR.   Obesity Body mass index is 40.87 kg/m.  Encouraged lifestyle modification  OSA  Encouraged nightly CPAP    Follow up with Dr Marven Slimmer post DCCV.    Informed Consent   Shared Decision Making/Informed Consent The risks (stroke, cardiac arrhythmias rarely resulting in the need for a temporary or permanent pacemaker, skin irritation or burns and complications associated with conscious sedation including aspiration, arrhythmia, respiratory failure and death), benefits (restoration of normal sinus rhythm) and alternatives of a direct current cardioversion were explained in detail to Ms. Serano and she agrees to proceed.       Myrtha Ates PA-C [facility] [address] [place], [place] [address] [phone]

## 2023-10-29 LAB — TSH: TSH: 3.5 u[IU]/mL (ref 0.450–4.500)

## 2023-11-08 ENCOUNTER — Ambulatory Visit (HOSPITAL_COMMUNITY)
Admission: RE | Admit: 2023-11-08 | Discharge: 2023-11-08 | Disposition: A | Discharge status: Home / Self Care | Source: Ambulatory Visit | Attending: Nurse Practitioner | Admitting: Nurse Practitioner

## 2023-11-08 DIAGNOSIS — Z933 Colostomy status: Secondary | ICD-10-CM | POA: Diagnosis present

## 2023-11-08 DIAGNOSIS — Z433 Encounter for attention to colostomy: Secondary | ICD-10-CM | POA: Diagnosis not present

## 2023-11-08 NOTE — Progress Notes (Signed)
 [facility]   Reason for visit:  LLQ colostomy, has thickened to soft consistency.  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) Echocardiogram 1/19: EF 60-65   History of COVID-19    Hypertension 06/08/2017   OSA (obstructive sleep apnea)    Persistent atrial fibrillation (HCC)    Persistent since admx w diverticulitis in 1/22  //  failed DCCV in 3/22  //  Dofetilide  load in 1/23 >> failed DCCV x 2   Tobacco abuse    Family History  Problem Relation Age of Onset   Heart failure Mother    Breast cancer Mother    Atrial fibrillation Mother    Atrial fibrillation Father    Heart failure Father    Diabetes Father    Colon cancer Neg Hx    Stomach cancer Neg Hx    Esophageal cancer Neg Hx    Pancreatic cancer Neg Hx    Liver disease Neg Hx    Allergies  Allergen Reactions   Heparin  Other (See Comments)    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      amiodarone  (PACERONE ) 200 MG tablet Take 0.5 tablets (100 mg total) by mouth daily.      apixaban  (ELIQUIS ) 5 MG TABS tablet Take 1 tablet (5 mg total) by mouth 2 (two) times daily. 180 tablet 1    apixaban  (ELIQUIS ) 5 MG TABS tablet Take 1 tablet (5 mg total) by mouth 2 (two) times daily. 28 tablet 0    ENTRESTO  24-26 MG Take 1 tablet by mouth twice daily 180 tablet 3    furosemide  (LASIX ) 20 MG tablet Take 1 tablet (20  mg total) by mouth daily as needed for fluid or edema (As needed for weight gain of 3 lb overnight or 5 lb in one week). 90 tablet 2    Magnesium  250 MG TABS Take 250 mg by mouth daily.      metoprolol  succinate (TOPROL -XL) 50 MG 24 hr tablet Take 1 tablet (50 mg total) by mouth daily. 90 tablet 3    potassium chloride  (KLOR-CON ) 10 MEQ tablet Take 1 tablet by mouth once daily 30 tablet 5    rosuvastatin (CRESTOR) 5 MG tablet Take 5 mg by mouth daily.      Vitamin D , Ergocalciferol , (DRISDOL ) 1.25 MG (50000 UNIT) CAPS capsule Take 50,000 Units by mouth once a week.      No current facility-administered medications for this encounter.   ROS  Review of Systems  Constitutional:  Positive for fatigue.  Gastrointestinal:        LLQ colostomy  Skin:  Positive for color change.  Psychiatric/Behavioral: Negative.    All other systems reviewed and are negative.  Vital signs:  BP (!) (P) 140/108 (BP Location: Right Arm)   Pulse (!) (P) 50   Temp (P) 98 F (36.7 C) (Oral)   Resp (P) 18  SpO2 (P) 93%  Exam:  Physical Exam Vitals reviewed.  Constitutional:      Appearance: Normal appearance.   Cardiovascular:     Rate and Rhythm: Normal rate and regular rhythm.     Comments: Rechecked BP at conclusion of visit,  Was 136/94.  Feels fine.  Its usually high  Pulmonary:     Breath sounds: Normal breath sounds.  Abdominal:     Palpations: Abdomen is soft.   Musculoskeletal:        General: Normal range of motion.   Skin:    General: Skin is warm and dry.     Findings: Erythema present.   Neurological:     Mental Status: She is alert and oriented to person, place, and time.   Psychiatric:        Mood and Affect: Mood normal.        Behavior: Behavior normal.     Stoma type/location:  LLQ colostomy Stomal assessment/size:  1 3/8 slightly budded  Peristomal assessment:  intact Treatment options for stomal/peristomal skin: barrier ring and 1piece flexible convex  skin prep   Output: soft brown stool  Ostomy pouching: 1pc.convex  Education provided:  she is needing supply order updated with [facility].     Impression/dx  colostomy Discussion  Will update prescription  see back as needed Plan  Follow up as needed.     Visit time: 45 minutes.   Darice Cooley FNP-BC

## 2023-11-08 NOTE — Discharge Instructions (Signed)
 Supply order to be sent to [facility]  1 piece convex with filter (BEIGE) Item # [id number] Skin prep ITEM # [id number] M9 spray  ITEM # [id number] Barrier ring [phone]

## 2023-11-08 NOTE — Anesthesia Preprocedure Evaluation (Addendum)
 Anesthesia Evaluation  Patient identified by MRN, date of birth, ID band Patient awake    Reviewed: Allergy & Precautions, H&P , NPO status , Patient's Chart, lab work & pertinent test results  Airway Mallampati: II  TM Distance: >3 FB Neck ROM: Full    Dental  (+) Poor Dentition, Chipped, Missing,    Pulmonary sleep apnea , Current Smoker   Pulmonary exam normal breath sounds clear to auscultation       Cardiovascular hypertension, Pt. on medications and Pt. on home beta blockers + dysrhythmias Atrial Fibrillation  Rhythm:Irregular Rate:Normal  Echo 11/2022  1. Left ventricular ejection fraction, by estimation, is 50 to 55%. The left ventricle has low normal function. The left ventricle has no regional wall motion abnormalities. There is mild concentric left ventricular hypertrophy. Left ventricular diastolic parameters are consistent with Grade I diastolic dysfunction (impaired relaxation).   2. Right ventricular systolic function is normal. The right ventricular size is normal. Tricuspid regurgitation signal is inadequate for assessing PA pressure.   3. The mitral valve is grossly normal. Trivial mitral valve regurgitation. No evidence of mitral stenosis.   4. The aortic valve is grossly normal. Aortic valve regurgitation is not visualized. No aortic stenosis is present.   5. The inferior vena cava is normal in size with greater than 50% respiratory variability, suggesting right atrial pressure of 3 mmHg.   Comparison(s): Changes from prior study are noted. The left ventricular function has improved.    LHC 12/2021  Normal right coronary with no connection to a mass or evidence of tumor blush.  Normal left main  Normal large circumflex  LAD is relatively small and has diffuse atherosclerosis but no high-grade obstruction.  Mild global hypokinesis with EF 45%.  EDP 13 mmHg       Neuro/Psych    GI/Hepatic   Endo/Other     Class 3 obesity  Renal/GU      Musculoskeletal   Abdominal  (+) + obese  Peds  Hematology   Anesthesia Other Findings   Reproductive/Obstetrics                             Anesthesia Physical Anesthesia Plan  ASA: 3  Anesthesia Plan: General   Post-op Pain Management: Minimal or no pain anticipated   Induction: Intravenous  PONV Risk Score and Plan: 2 and Propofol  infusion and TIVA  Airway Management Planned: Mask  Additional Equipment:   Intra-op Plan:   Post-operative Plan:   Informed Consent: I have reviewed the patients History and Physical, chart, labs and discussed the procedure including the risks, benefits and alternatives for the proposed anesthesia with the patient or authorized representative who has indicated his/her understanding and acceptance.     Dental advisory given  Plan Discussed with: CRNA  Anesthesia Plan Comments:         Anesthesia Quick Evaluation

## 2023-11-08 NOTE — Progress Notes (Signed)
 Spoke to patient and instructed them to come at 0930  and to be NPO after 0000.  Medications reviewed.    Confirmed that patient will have a ride home and someone to stay with them for 24 hours after the procedure.

## 2023-11-09 ENCOUNTER — Other Ambulatory Visit: Payer: Self-pay

## 2023-11-09 ENCOUNTER — Encounter (HOSPITAL_COMMUNITY): Payer: Self-pay | Admitting: Internal Medicine

## 2023-11-09 ENCOUNTER — Ambulatory Visit (HOSPITAL_COMMUNITY): Payer: Self-pay | Admitting: Anesthesiology

## 2023-11-09 ENCOUNTER — Ambulatory Visit (HOSPITAL_COMMUNITY)
Admission: RE | Admit: 2023-11-09 | Discharge: 2023-11-09 | Disposition: A | Discharge status: Home / Self Care | Source: Ambulatory Visit | Attending: Internal Medicine | Admitting: Internal Medicine

## 2023-11-09 ENCOUNTER — Encounter (HOSPITAL_COMMUNITY)
Admission: RE | Disposition: A | Discharge status: Home / Self Care | Payer: Self-pay | Source: Ambulatory Visit | Attending: Internal Medicine

## 2023-11-09 DIAGNOSIS — F17200 Nicotine dependence, unspecified, uncomplicated: Secondary | ICD-10-CM | POA: Insufficient documentation

## 2023-11-09 DIAGNOSIS — I110 Hypertensive heart disease with heart failure: Secondary | ICD-10-CM | POA: Insufficient documentation

## 2023-11-09 DIAGNOSIS — Z6841 Body Mass Index (BMI) 40.0 and over, adult: Secondary | ICD-10-CM | POA: Insufficient documentation

## 2023-11-09 DIAGNOSIS — I10 Essential (primary) hypertension: Secondary | ICD-10-CM

## 2023-11-09 DIAGNOSIS — Z79899 Other long term (current) drug therapy: Secondary | ICD-10-CM | POA: Diagnosis not present

## 2023-11-09 DIAGNOSIS — G4733 Obstructive sleep apnea (adult) (pediatric): Secondary | ICD-10-CM | POA: Diagnosis not present

## 2023-11-09 DIAGNOSIS — I4891 Unspecified atrial fibrillation: Secondary | ICD-10-CM | POA: Diagnosis not present

## 2023-11-09 DIAGNOSIS — I5040 Unspecified combined systolic (congestive) and diastolic (congestive) heart failure: Secondary | ICD-10-CM | POA: Insufficient documentation

## 2023-11-09 DIAGNOSIS — I4819 Other persistent atrial fibrillation: Secondary | ICD-10-CM | POA: Insufficient documentation

## 2023-11-09 DIAGNOSIS — E66813 Obesity, class 3: Secondary | ICD-10-CM | POA: Insufficient documentation

## 2023-11-09 DIAGNOSIS — D6869 Other thrombophilia: Secondary | ICD-10-CM | POA: Insufficient documentation

## 2023-11-09 DIAGNOSIS — Z7901 Long term (current) use of anticoagulants: Secondary | ICD-10-CM | POA: Diagnosis not present

## 2023-11-09 HISTORY — PX: CARDIOVERSION: EP1203

## 2023-11-09 SURGERY — CARDIOVERSION (CATH LAB)
Anesthesia: General

## 2023-11-09 MED ORDER — SODIUM CHLORIDE 0.9% FLUSH: 3.0000 mL | INTRAVENOUS | Status: DC | PRN

## 2023-11-09 MED ORDER — PROPOFOL 10 MG/ML IV BOLUS
INTRAVENOUS | Status: DC | PRN
  Administered 2023-11-09: 80 mg via INTRAVENOUS
  Administered 2023-11-09: 20 mg via INTRAVENOUS

## 2023-11-09 MED ORDER — SODIUM CHLORIDE 0.9% FLUSH: 3.0000 mL | Freq: Two times a day (BID) | INTRAVENOUS | Status: DC

## 2023-11-09 MED ORDER — LIDOCAINE 2% (20 MG/ML) 5 ML SYRINGE
INTRAMUSCULAR | Status: DC | PRN
  Administered 2023-11-09: 100 mg via INTRAVENOUS

## 2023-11-09 SURGICAL SUPPLY — 1 items: PAD DEFIB RADIO PHYSIO CONN (PAD) ×1 IMPLANT

## 2023-11-09 NOTE — Transfer of Care (Signed)
 Immediate Anesthesia Transfer of Care Note  Patient: Tammy Davies  Procedure(s) Performed: CARDIOVERSION  Patient Location: PACU  Anesthesia Type:MAC  Level of Consciousness: sedated  Airway & Oxygen  Therapy: Patient Spontanous Breathing and Patient connected to nasal cannula oxygen   Post-op Assessment: Report given to RN and Post -op Vital signs reviewed and stable  Post vital signs: Reviewed and stable  Last Vitals:  Vitals Value Taken Time  BP    Temp    Pulse    Resp    SpO2      Last Pain:  Vitals:   11/09/23 1004  TempSrc:   PainSc: 0-No pain         Complications: No notable events documented.

## 2023-11-09 NOTE — Interval H&P Note (Signed)
 History and Physical Interval Note:  11/09/2023 10:48 AM  Tammy Davies  has presented today for surgery, with the diagnosis of afib.  The various methods of treatment have been discussed with the patient and family. After consideration of risks, benefits and other options for treatment, the patient has consented to  Procedure(s): CARDIOVERSION (N/A) as a surgical intervention.  The patient's history has been reviewed, patient examined, no change in status, stable for surgery.  I have reviewed the patient's chart and labs.  Questions were answered to the patient's satisfaction.     [name] A Rebeka Kimble

## 2023-11-09 NOTE — CV Procedure (Signed)
 Procedure: Electrical Cardioversion Indications:  Atrial Fibrillation  Procedure Details:  Consent: Risks of procedure as well as the alternatives and risks of each were explained to the (patient/caregiver).  Consent for procedure obtained.  Time Out: Verified patient identification, verified procedure, site/side was marked, verified correct patient position, special equipment/implants available, medications/allergies/relevent history reviewed, required imaging and test results available. PERFORMED.  Patient placed on cardiac monitor, pulse oximetry, supplemental oxygen  as necessary.  Sedation given: propofol  per anesthesia Pacer pads placed anterior and posterior chest.  Cardioverted 4 time(s).  Cardioversion with synchronized biphasic 200J, 300J, 360J, 360J shock.  Evaluation: Findings: Post procedure EKG shows: NSR Complications: None Patient did tolerate procedure well.  Time Spent Directly with the Patient:  20 minutes   Cyan Moultrie A Naylee Frankowski 11/09/2023, 11:00 AM

## 2023-11-09 NOTE — Anesthesia Postprocedure Evaluation (Signed)
 Anesthesia Post Note  Patient: Tammy Davies  Procedure(s) Performed: CARDIOVERSION     Patient location during evaluation: Cath Lab Anesthesia Type: General Level of consciousness: sedated and patient cooperative Pain management: pain level controlled Vital Signs Assessment: post-procedure vital signs reviewed and stable Respiratory status: spontaneous breathing Cardiovascular status: stable Anesthetic complications: no   No notable events documented.  Last Vitals:  Vitals:   11/09/23 1130 11/09/23 1135  BP: (!) 111/93 99/67  Pulse: 63 63  Resp: 17 20  Temp:    SpO2: 95% 97%    Last Pain:  Vitals:   11/09/23 1130  TempSrc:   PainSc: 0-No pain                 Norleen Pope

## 2023-11-11 ENCOUNTER — Encounter (HOSPITAL_BASED_OUTPATIENT_CLINIC_OR_DEPARTMENT_OTHER): Payer: Self-pay | Admitting: *Deleted

## 2023-11-11 ENCOUNTER — Emergency Department (HOSPITAL_BASED_OUTPATIENT_CLINIC_OR_DEPARTMENT_OTHER)

## 2023-11-11 ENCOUNTER — Other Ambulatory Visit: Payer: Self-pay

## 2023-11-11 ENCOUNTER — Emergency Department (HOSPITAL_BASED_OUTPATIENT_CLINIC_OR_DEPARTMENT_OTHER)
Admission: EM | Admit: 2023-11-11 | Discharge: 2023-11-12 | Disposition: A | Discharge status: Home / Self Care | Attending: Emergency Medicine | Admitting: Emergency Medicine

## 2023-11-11 DIAGNOSIS — Z79899 Other long term (current) drug therapy: Secondary | ICD-10-CM | POA: Diagnosis not present

## 2023-11-11 DIAGNOSIS — I4891 Unspecified atrial fibrillation: Secondary | ICD-10-CM | POA: Diagnosis not present

## 2023-11-11 DIAGNOSIS — Z7901 Long term (current) use of anticoagulants: Secondary | ICD-10-CM | POA: Diagnosis not present

## 2023-11-11 DIAGNOSIS — R000 Tachycardia, unspecified: Secondary | ICD-10-CM | POA: Diagnosis present

## 2023-11-11 LAB — CBC
HCT: 49.1 % — ABNORMAL HIGH (ref 36.0–46.0)
Hemoglobin: 16 g/dL — ABNORMAL HIGH (ref 12.0–15.0)
MCH: 29 pg (ref 26.0–34.0)
MCHC: 32.6 g/dL (ref 30.0–36.0)
MCV: 88.9 fL (ref 80.0–100.0)
Platelets: 209 10*3/uL (ref 150–400)
RBC: 5.52 MIL/uL — ABNORMAL HIGH (ref 3.87–5.11)
RDW: 15.4 % (ref 11.5–15.5)
WBC: 10.5 10*3/uL (ref 4.0–10.5)
nRBC: 0 % (ref 0.0–0.2)

## 2023-11-11 LAB — BASIC METABOLIC PANEL WITH GFR
Anion gap: 12 (ref 5–15)
BUN: 15 mg/dL (ref 8–23)
CO2: 22 mmol/L (ref 22–32)
Calcium: 9.9 mg/dL (ref 8.9–10.3)
Chloride: 104 mmol/L (ref 98–111)
Creatinine, Ser: 1.24 mg/dL — ABNORMAL HIGH (ref 0.44–1.00)
GFR, Estimated: 49 mL/min — ABNORMAL LOW (ref >60)
Glucose, Bld: 126 mg/dL — ABNORMAL HIGH (ref 70–99)
Potassium: 4.3 mmol/L (ref 3.5–5.1)
Sodium: 138 mmol/L (ref 135–145)

## 2023-11-11 LAB — TROPONIN T, HIGH SENSITIVITY: Troponin T High Sensitivity: <15 ng/L (ref <19)

## 2023-11-11 MED ORDER — ETOMIDATE 2 MG/ML IV SOLN
10.0000 mg | Freq: Once | INTRAVENOUS | Status: AC
  Administered 2023-11-11: 10 mg via INTRAVENOUS
  Filled 2023-11-11: qty 10

## 2023-11-11 MED ORDER — FENTANYL CITRATE PF 50 MCG/ML IJ SOSY
50.0000 ug | PREFILLED_SYRINGE | Freq: Once | INTRAMUSCULAR | Status: AC
  Administered 2023-11-11: 50 ug via INTRAVENOUS
  Filled 2023-11-11: qty 1

## 2023-11-11 NOTE — ED Notes (Signed)
 Pt unresponsive after med admin. Cannot verbally address pain at this time. Shock administered 150J @ 2329 by Armenta MD.

## 2023-11-11 NOTE — ED Notes (Signed)
 RT at bedside for conscious/procedural sedation for cardioversion. Pt respiratory status remained stable throughout procedure. Pt remains on ETCO2 [place] 4Lpm at this time. RT will continue to monitor while at [facility].    11/11/23 2335  Therapy Vitals  Pulse Rate 61  Resp 19  BP 114/66  MEWS Score/Color  MEWS Score 0  MEWS Score Color Green  Respiratory Assessment  Assessment Type Assess only  Respiratory Pattern Regular;Unlabored;Dyspnea at rest;Symmetrical;Tachypnea  Chest Assessment Chest expansion symmetrical  Cough Strong;Congested;Non-productive  Bilateral Breath Sounds Rhonchi;Diminished  R Upper  Breath Sounds Rhonchi  L Upper Breath Sounds Rhonchi  R Lower Breath Sounds Diminished  L Lower Breath Sounds Diminished  Oxygen  Therapy/Pulse Ox  O2 Device ETCO2 Nasal Cannula  O2 Therapy Oxygen   O2 Flow Rate (L/min) 4 L/min  SpO2 93 %  ETCO2 (mmHg) 27 mmHg

## 2023-11-11 NOTE — Progress Notes (Signed)
 Electrophysiology Office Follow up Visit Note:    Date:  11/12/2023   ID:  Tammy Davies, DOB Aug 11, 1960, MRN [id number]  PCP:  Patient, No Pcp Per  [facility] Cardiologist:  Lonni LITTIE Nanas, MD  [facility] Electrophysiologist:  OLE ONEIDA HOLTS, MD    Interval History:     Tammy Davies is a 63 y.o. female who presents for a follow up visit.   I last saw the patient July 28, 2022.  She has a history of persistent atrial fibrillation.  She was previously scheduled for an ablation but preop imaging demonstrated a mediastinal soft tissue mass.  This was followed up with a PET which showed no FDG avidity.  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  She saw a [place] in the A-fib clinic October 28, 2023.  She was on amiodarone .  She was back in atrial fibrillation.  Cardioversion was scheduled and performed on November 09, 2023.  She presents today to discuss possible catheter ablation.  She is doing well although can continues to have episodes of atrial fibrillation.  She is very interested in stopping amiodarone .  She continues to take Eliquis  for stroke prophylaxis.  She is interested in proceeding with catheter ablation.      Past medical, surgical, social and family history were reviewed.  ROS:   Please see the history of present illness.    All other systems reviewed and are negative.  EKGs/Labs/Other Studies Reviewed:    The following studies were reviewed today:  December 09, 2022 echo EF normal, RV normal, trivial MR   EKG Interpretation Date/Time:  Friday November 12 2023 09:02:55 EDT Ventricular Rate:  65 PR Interval:  202 QRS Duration:  120 QT Interval:  444 QTC Calculation: 461 R Axis:   -59  Text Interpretation: Normal sinus rhythm Confirmed by HOLTS OLE [phone]) on 11/12/2023 9:08:21 AM    Physical Exam:    VS:  BP (!) 100/58   Pulse 65   Ht 5' 7 (1.702 m)   Wt 265 lb (120.2 kg)   SpO2 93%   BMI 41.50 kg/m     Wt  Readings from Last 3 Encounters:  11/12/23 265 lb (120.2 kg)  11/09/23 260 lb (117.9 kg)  10/28/23 268 lb 12.8 oz (121.9 kg)     GEN: no distress CARD: RRR, No MRG RESP: No IWOB. CTAB.      ASSESSMENT:    1. Persistent atrial fibrillation (HCC)   2. Encounter for monitoring amiodarone  therapy    PLAN:    In order of problems listed above:  #Persistent atrial fibrillation #High risk drug monitoring-amiodarone  Recent lab work in June showed stable liver and thyroid  function. I discussed treatment options for her atrial fibrillation today including continuing with amiodarone  versus pursuing catheter ablation.  I think she is a reasonable candidate to pursue catheter ablation.  I have discussed the procedure and she wishes to proceed.  Continue Eliquis   Discussed treatment options today for AF including antiarrhythmic drug therapy and ablation. Discussed risks, recovery and likelihood of success with each treatment strategy. Risk, benefits, and alternatives to EP study and ablation for afib were discussed. These risks include but are not limited to stroke, bleeding, vascular damage, tamponade, perforation, damage to the esophagus, lungs, phrenic nerve and other structures, pulmonary vein stenosis, worsening renal function, coronary vasospasm and death.  Discussed potential need for repeat ablation procedures and antiarrhythmic drugs after an initial ablation. The patient understands these risk and wishes to  proceed.  We will therefore proceed with catheter ablation at the next available time.  Carto, ICE, anesthesia are requested for the procedure.      Signed, Ole Holts, MD, [facility], [facility] 11/12/2023 9:14 AM    Electrophysiology  [facility]

## 2023-11-11 NOTE — ED Notes (Signed)
 RT placed pt on ETCO2 [place] 4Lpm prior to conscious/procedural sedation.BVM at bedside w/oxygen , airway cart outside room, pt on monitor w/RT at bedside. Pt respiratory status stable on ETCO2 [place] 4 Lpm w/BLBS rhonchi/dim w/no distress noted. Pt has hx of OSA and is a smoker. RT will continue to monitor while at [facility].    11/11/23 2327  Therapy Vitals  Patient Position (if appropriate) Lying  MEWS Score/Color  MEWS Score 3  MEWS Score Color Yellow  Respiratory Assessment  $ RT Protocol Assessment  Yes  Respiratory Pattern Regular;Unlabored;Dyspnea at rest;Symmetrical;Tachypnea  Chest Assessment Chest expansion symmetrical  Cough Strong;Congested;Non-productive  Bilateral Breath Sounds Rhonchi;Diminished  R Upper  Breath Sounds Rhonchi  L Upper Breath Sounds Rhonchi  R Lower Breath Sounds Diminished  L Lower Breath Sounds Diminished  Oxygen  Therapy/Pulse Ox  O2 Device ETCO2 Nasal Cannula  O2 Therapy Oxygen   O2 Flow Rate (L/min) 4 L/min  SpO2 100 %  ETCO2 (mmHg) 24 mmHg

## 2023-11-11 NOTE — ED Triage Notes (Signed)
 Patient to ED POV with chest pain, shortness of breath, dizziness and cough since Tuesday. No fevers. Pateint concerned for pneumonia reporting 7 people at work have had it recently.   Patient also had a scheduled cardioversion this week for preexisting Afib.

## 2023-11-11 NOTE — ED Provider Notes (Addendum)
 [place] EMERGENCY DEPARTMENT AT [facility] Provider Note   CSN: [id number] Arrival date & time: 11/11/23  2137     Patient presents with: Chest Pain and Shortness of Breath   Tammy Davies is a 63 y.o. female.   HPI Patient has had recurrent episodes of atrial fibrillation.  She has had prior cardioversions.  Patient is compliant with Eliquis  and is taken her morning dose.  Patient reports that she started to have racing heart and dizziness.  She has been in atrial fibrillation multiple times before and recognized that.  She reports that it might have been started by some cough.  She has had no fever.  People at work have had diagnosis of pneumonia.    Prior to Admission medications   Medication Sig Start Date End Date Taking? Authorizing Provider  acetaminophen  (TYLENOL ) 500 MG tablet Take 1,000 mg by mouth every 6 (six) hours as needed for mild pain, fever or headache.    [provider]  amiodarone  (PACERONE ) 200 MG tablet Take 0.5 tablets (100 mg total) by mouth daily. 02/09/23   Fenton, Clint R, PA  apixaban  (ELIQUIS ) 5 MG TABS tablet Take 1 tablet (5 mg total) by mouth 2 (two) times daily. 05/17/23   Kate Lonni CROME, MD  apixaban  (ELIQUIS ) 5 MG TABS tablet Take 1 tablet (5 mg total) by mouth 2 (two) times daily. 10/19/23   Rana Lum CROME, NP  ENTRESTO  24-26 MG Take 1 tablet by mouth twice daily 01/12/23   Lelon Hamilton T, PA-C  furosemide  (LASIX ) 20 MG tablet Take 1 tablet (20 mg total) by mouth daily as needed for fluid or edema (As needed for weight gain of 3 lb overnight or 5 lb in one week). 11/10/22   Kate Lonni CROME, MD  Magnesium  250 MG TABS Take 250 mg by mouth daily.    [provider]  metoprolol  succinate (TOPROL -XL) 50 MG 24 hr tablet Take 1 tablet (50 mg total) by mouth daily. 11/10/22   Kate Lonni CROME, MD  potassium chloride  (KLOR-CON ) 10 MEQ tablet Take 1 tablet by mouth once daily 09/27/23   Kate Lonni CROME, MD  rosuvastatin (CRESTOR) 5 MG tablet Take 5 mg by mouth daily. 10/21/22   [provider]  Vitamin D , Ergocalciferol , (DRISDOL ) 1.25 MG (50000 UNIT) CAPS capsule Take 50,000 Units by mouth once a week. 10/21/22   [provider]    Allergies: Heparin     Review of Systems  Updated Vital Signs BP 114/66   Pulse 61   Temp 97.6 F (36.4 C)   Resp 19   SpO2 93%   Physical Exam Constitutional:      Comments: Patient is alert nontoxic no distress.  HENT:     Mouth/Throat:     Mouth: Mucous membranes are moist.     Pharynx: Oropharynx is clear.   Eyes:     Extraocular Movements: Extraocular movements intact.    Cardiovascular:     Rate and Rhythm: Tachycardia present. Rhythm irregular.  Pulmonary:     Effort: Pulmonary effort is normal.     Breath sounds: Normal breath sounds.  Abdominal:     General: There is no distension.     Palpations: Abdomen is soft.     Tenderness: There is no abdominal tenderness. There is no guarding.   Musculoskeletal:        General: No swelling or tenderness. Normal range of motion.     Right lower leg: No edema.  Left lower leg: No edema.   Skin:    General: Skin is warm and dry.   Neurological:     General: No focal deficit present.     Mental Status: She is oriented to person, place, and time.     Motor: No weakness.     Coordination: Coordination normal.   Psychiatric:        Mood and Affect: Mood normal.     (all labs ordered are listed, but only abnormal results are displayed) Labs Reviewed  BASIC METABOLIC PANEL WITH GFR - Abnormal; Notable for the following components:      Result Value   Glucose, Bld 126 (*)    Creatinine, Ser 1.24 (*)    GFR, Estimated 49 (*)    All other components within normal limits  CBC - Abnormal; Notable for the following components:   RBC 5.52 (*)    Hemoglobin 16.0 (*)    HCT 49.1 (*)    All other components within normal limits  TROPONIN T, HIGH  SENSITIVITY  TROPONIN T, HIGH SENSITIVITY    EKG: EKG Interpretation Date/Time:  Thursday November 11 2023 21:50:01 EDT Ventricular Rate:  127 PR Interval:    QRS Duration:  140 QT Interval:  368 QTC Calculation: 535 R Axis:   -68  Text Interpretation: Atrial fibrillation Left bundle branch block Baseline wander in lead(s) II III aVF V1 V4 old LBBB. Afib new since previous tracing Confirmed by Armenta Canning [phone]) on 11/11/2023 10:00:59 PM  Radiology: [facility] Chest Port 1 View Result Date: 11/11/2023 CLINICAL DATA:  Shortness of breath EXAM: PORTABLE CHEST 1 VIEW COMPARISON:  Chest x-ray 09/04/2023 FINDINGS: Heart is mildly enlarged, unchanged. There is no focal lung infiltrate trait, pleural effusion or pneumothorax. No acute fractures are seen. IMPRESSION: 1. No active disease. 2. Mild cardiomegaly. Electronically Signed   By: Greig Pique M.D.   On: 11/11/2023 23:02     .Cardioversion  Date/Time: 11/11/2023 11:34 PM  Performed by: Armenta Canning, MD Authorized by: Armenta Canning, MD   Consent:    Consent obtained:  Verbal   Consent given by:  Patient   Risks discussed:  Cutaneous burn, death, induced arrhythmia and pain Pre-procedure details:    Cardioversion basis:  Elective   Rhythm:  Atrial fibrillation   Electrode placement:  Anterior-posterior Patient sedated: Yes. Refer to sedation procedure documentation for details of sedation.  Attempt one:    Cardioversion mode:  Synchronous   Waveform:  Biphasic   Shock (Joules):  150   Shock outcome:  Conversion to normal sinus rhythm Post-procedure details:    Patient status:  Awake   Patient tolerance of procedure:  Tolerated well, no immediate complications .Sedation  Date/Time: 11/11/2023 11:34 PM  Performed by: Armenta Canning, MD Authorized by: Armenta Canning, MD   Consent:    Consent obtained:  Verbal   Consent given by:  Patient   Risks discussed:  Allergic reaction, dysrhythmia, inadequate sedation, nausea,  vomiting, respiratory compromise necessitating ventilatory assistance and intubation, prolonged sedation necessitating reversal and prolonged hypoxia resulting in organ damage Universal protocol:    Immediately prior to procedure, a time out was called: yes     Patient identity confirmed:  Verbally with patient Indications:    Procedure performed:  Cardioversion   Procedure necessitating sedation performed by:  Physician performing sedation Pre-sedation assessment:    Time since last food or drink:  12   ASA classification: class 4 - patient with severe systemic disease that is a  constant threat to life     Mouth opening:  3 or more finger widths   Thyromental distance:  3 finger widths   Mallampati score:  II - soft palate, uvula, fauces visible   Neck mobility: normal     Pre-sedation assessments completed and reviewed: airway patency, cardiovascular function, hydration status, mental status, nausea/vomiting, pain level, respiratory function and temperature   A pre-sedation assessment was completed prior to the start of the procedure Immediate pre-procedure details:    Reassessment: Patient reassessed immediately prior to procedure     Reviewed: vital signs, relevant labs/tests and NPO status     Verified: bag valve mask available, emergency equipment available, intubation equipment available, IV patency confirmed, oxygen  available, reversal medications available and suction available   Procedure details (see MAR for exact dosages):    Preoxygenation:  Nasal cannula   Sedation:  Etomidate    Intended level of sedation: deep   Analgesia:  Fentanyl    Intra-procedure monitoring:  Blood pressure monitoring, cardiac monitor, continuous pulse oximetry, continuous capnometry, frequent LOC assessments and frequent vital sign checks   Intra-procedure events: none     Total Provider sedation time (minutes):  20 Post-procedure details:   A post-sedation assessment was completed following the  completion of the procedure.   Attendance: Constant attendance by certified staff until patient recovered     Recovery: Patient returned to pre-procedure baseline     Post-sedation assessments completed and reviewed: airway patency, cardiovascular function, hydration status, mental status, nausea/vomiting, pain level, respiratory function and temperature     Procedure completion:  Tolerated well, no immediate complications    Medications Ordered in the ED  etomidate  (AMIDATE ) injection 10 mg (10 mg Intravenous Given 11/11/23 2328)  fentaNYL  (SUBLIMAZE ) injection 50 mcg (50 mcg Intravenous Given 11/11/23 2327)                                    Medical Decision Making Amount and/or Complexity of Data Reviewed Labs: ordered. Radiology: ordered.  Risk Prescription drug management.   Patient presents as outlined.  She has had frequent episodes of atrial fibrillation and cardioversions.  Patient presents today wishing to have a cardioversion.  She is compliant with Eliquis .  She reports she is due to see her cardiologist tomorrow to talk about doing an ablation.  On exam patient is alert no respiratory distress nontoxic.  Normotensive.  She is an appropriate candidate for elective cardioversion, will proceed with chest x-ray and lab work to rule out any electrolyte derangement, pneumonia or other contributing factor.  Troponin less than 15 potassium 4.3 GFR 49 white count 10 hemoglobin 16  Portable chest x-ray interpreted by radiology no active disease mild cardiomegaly.  Patient was electively cardioverted with successful conversion to sinus rhythm.  Patient tolerated well.  At this time no signs of other immediate complications.  Vital signs are stable.  No metabolic derangement.  No signs of active pneumonia.  Patient will be stable for discharge for observation..  She has follow-up with cardiology tomorrow.  Post cardioversion EKG shows sinus rhythm with some inferior ST depression.    00: 21 patient reports she feels 100% better but she feels nauseated.  Will administer Zofran  4 mg IV and observe.  Dr. Geroldine to observe and follow up second troponin.  If patient remains well and symptoms resolved, and troponin remains flat, will be appropriate for discharge.      Final  diagnoses:  Atrial fibrillation with rapid ventricular response [facility])    ED Discharge Orders     None          Armenta Canning, MD 11/11/23 [id number]    Armenta Canning, MD 11/12/23 ARMENTA Armenta Canning, MD 11/12/23 [phone]

## 2023-11-11 NOTE — Discharge Instructions (Signed)
 1.  Follow-up with your cardiologist as planned tomorrow. 2.  Continue all of your regular medications. 3.  Return to the emergency department if you have any problems or concerns.

## 2023-11-12 ENCOUNTER — Other Ambulatory Visit (HOSPITAL_COMMUNITY): Payer: Self-pay | Admitting: Nurse Practitioner

## 2023-11-12 ENCOUNTER — Encounter: Payer: Self-pay | Admitting: Cardiology

## 2023-11-12 ENCOUNTER — Other Ambulatory Visit: Payer: Self-pay

## 2023-11-12 ENCOUNTER — Ambulatory Visit: Attending: Cardiology | Admitting: Cardiology

## 2023-11-12 VITALS — BP 100/58 | HR 65 | Ht 67.0 in | Wt 265.0 lb

## 2023-11-12 DIAGNOSIS — I4819 Other persistent atrial fibrillation: Secondary | ICD-10-CM | POA: Diagnosis not present

## 2023-11-12 DIAGNOSIS — L24B3 Irritant contact dermatitis related to fecal or urinary stoma or fistula: Secondary | ICD-10-CM

## 2023-11-12 DIAGNOSIS — Z79899 Other long term (current) drug therapy: Secondary | ICD-10-CM

## 2023-11-12 DIAGNOSIS — Z433 Encounter for attention to colostomy: Secondary | ICD-10-CM

## 2023-11-12 DIAGNOSIS — Z5181 Encounter for therapeutic drug level monitoring: Secondary | ICD-10-CM

## 2023-11-12 LAB — TROPONIN T, HIGH SENSITIVITY: Troponin T High Sensitivity: <15 ng/L (ref <19)

## 2023-11-12 MED ORDER — ONDANSETRON HCL 4 MG/2ML IJ SOLN
4.0000 mg | Freq: Once | INTRAMUSCULAR | Status: AC
  Administered 2023-11-12: 4 mg via INTRAVENOUS

## 2023-11-12 NOTE — ED Provider Notes (Signed)
  Physical Exam  BP 94/68   Pulse 65   Temp 97.6 F (36.4 C)   Resp 20   SpO2 98%   Physical Exam Vitals and nursing note reviewed.  Constitutional:      Appearance: She is well-developed.  HENT:     Head: Normocephalic and atraumatic.   Cardiovascular:     Rate and Rhythm: Normal rate.   Musculoskeletal:     Cervical back: Normal range of motion.   Neurological:     Mental Status: She is alert.     Procedures  Procedures  ED Course / MDM    Medical Decision Making Amount and/or Complexity of Data Reviewed Labs: ordered. Radiology: ordered.  Risk Prescription drug management.   Care assumed from Dr. Ismael at shift change.  Patient awaiting results of repeat troponin and observation following cardioversion.  Her troponin has resulted and is normal.  She has been reassessed and patient states that she feels considerably better and is requesting to go home.  Patient to be discharged with as needed return.       Geroldine Berg, MD 11/12/23 0200

## 2023-11-12 NOTE — Addendum Note (Signed)
 Addended by: CHAUVIGNE, Inita Uram on: 11/12/2023 09:18 AM   Modules accepted: Orders

## 2023-11-12 NOTE — ED Notes (Signed)
 RT assessed pt at this time w/ETCO2 titration from 4Lpm to 2 Lpm post conscious/procedural sedation. Pt respiratory status remains stable on current liter flow w/no distress noted. RT will continue to monitor while @ [facility].   11/12/23 0003  Therapy Vitals  Pulse Rate 63  Resp 16  Patient Position (if appropriate) Sitting  MEWS Score/Color  MEWS Score 0  MEWS Score Color Green  Respiratory Assessment  Assessment Type Assess only  Respiratory Pattern Regular;Unlabored;Dyspnea at rest;Symmetrical;Tachypnea  Chest Assessment Chest expansion symmetrical  Cough Strong;Congested;Non-productive  Bilateral Breath Sounds Rhonchi;Diminished  R Upper  Breath Sounds Rhonchi  L Upper Breath Sounds Rhonchi  R Lower Breath Sounds Diminished  L Lower Breath Sounds Diminished  Oxygen  Therapy/Pulse Ox  O2 Device ETCO2 Nasal Cannula  O2 Therapy Oxygen   O2 Flow Rate (L/min) (S)  2 L/min  SpO2 97 %  ETCO2 (mmHg) 33 mmHg

## 2023-11-12 NOTE — ED Notes (Signed)
 RT titrated pt ETCO2 [place] to RA but remains in place for CO2 monitoring. Pt respiratory status stable on RA w/no distress at this time.    11/12/23 0102  Therapy Vitals  Pulse Rate 65  Resp 20  BP 94/68  Patient Position (if appropriate) Sitting  MEWS Score/Color  MEWS Score 1  MEWS Score Color Green  Respiratory Assessment  Assessment Type Assess only  Respiratory Pattern Regular;Unlabored;Dyspnea at rest;Symmetrical;Tachypnea  Chest Assessment Chest expansion symmetrical  Cough Strong;Congested;Non-productive  Bilateral Breath Sounds Rhonchi;Diminished  R Upper  Breath Sounds Rhonchi  L Upper Breath Sounds Rhonchi  R Lower Breath Sounds Diminished  L Lower Breath Sounds Diminished  Oxygen  Therapy/Pulse Ox  O2 Device ETCO2 Nasal Cannula  O2 Therapy (S)  Room air  SpO2 98 %  ETCO2 (mmHg) 36 mmHg

## 2023-11-12 NOTE — Patient Instructions (Addendum)
 Medication Instructions:  Your physician recommends that you continue on your current medications as directed. Please refer to the Current Medication list given to you today.  *If you need a refill on your cardiac medications before your next appointment, please call your pharmacy*  Testing/Procedures: Ablation Your physician has recommended that you have an ablation. Catheter ablation is a medical procedure used to treat some cardiac arrhythmias (irregular heartbeats). During catheter ablation, a long, thin, flexible tube is put into a blood vessel in your groin (upper thigh), or neck. This tube is called an ablation catheter. It is then guided to your heart through the blood vessel. Radio frequency waves destroy small areas of heart tissue where abnormal heartbeats may cause an arrhythmia to start.   You are scheduled for Atrial Fibrillation Ablation on Tuesday, September 16th  with Dr. Ole Holts.Please arrive at the Main Entrance A at [facility]: [address] [place], [place] [address] at 5:30am  What To Expect:  Labs: you will need to have lab work drawn within 30 days of your procedure. Please go to any [facility] location to have these drawn - no appointment is needed. You will receive procedure instructions either through MyChart or in the mail 4-6 week prior to your procedure.  After your procedure we recommend no driving for 3 days, no lifting over 10 lbs for 5 days, and no work or strenuous activity for 7 days.  Please contact our office at [phone] if you have any questions.   Follow-Up: We will contact you to schedule your post-procedure appointments.

## 2023-11-15 DIAGNOSIS — Z433 Encounter for attention to colostomy: Secondary | ICD-10-CM | POA: Insufficient documentation

## 2023-12-02 ENCOUNTER — Ambulatory Visit (HOSPITAL_COMMUNITY)
Admission: RE | Admit: 2023-12-02 | Discharge: 2023-12-02 | Disposition: A | Discharge status: Home / Self Care | Source: Ambulatory Visit | Attending: Cardiology | Admitting: Cardiology

## 2023-12-02 DIAGNOSIS — I5022 Chronic systolic (congestive) heart failure: Secondary | ICD-10-CM | POA: Insufficient documentation

## 2023-12-02 LAB — ECHOCARDIOGRAM COMPLETE
Area-P 1/2: 3.79 cm2
S' Lateral: 4.25 cm

## 2023-12-13 ENCOUNTER — Ambulatory Visit: Admitting: Internal Medicine

## 2023-12-14 ENCOUNTER — Other Ambulatory Visit: Payer: Self-pay | Admitting: Physician Assistant

## 2023-12-14 ENCOUNTER — Ambulatory Visit: Attending: Internal Medicine | Admitting: Emergency Medicine

## 2023-12-14 ENCOUNTER — Other Ambulatory Visit: Payer: Self-pay | Admitting: Cardiology

## 2023-12-14 ENCOUNTER — Encounter: Payer: Self-pay | Admitting: Emergency Medicine

## 2023-12-14 VITALS — BP 102/74 | HR 98 | Ht 67.0 in | Wt 265.0 lb

## 2023-12-14 DIAGNOSIS — I5022 Chronic systolic (congestive) heart failure: Secondary | ICD-10-CM

## 2023-12-14 DIAGNOSIS — I4819 Other persistent atrial fibrillation: Secondary | ICD-10-CM | POA: Diagnosis not present

## 2023-12-14 DIAGNOSIS — I2541 Coronary artery aneurysm: Secondary | ICD-10-CM | POA: Diagnosis not present

## 2023-12-14 DIAGNOSIS — E785 Hyperlipidemia, unspecified: Secondary | ICD-10-CM

## 2023-12-14 DIAGNOSIS — I10 Essential (primary) hypertension: Secondary | ICD-10-CM

## 2023-12-14 DIAGNOSIS — I2510 Atherosclerotic heart disease of native coronary artery without angina pectoris: Secondary | ICD-10-CM

## 2023-12-14 DIAGNOSIS — G4733 Obstructive sleep apnea (adult) (pediatric): Secondary | ICD-10-CM

## 2023-12-14 LAB — BASIC METABOLIC PANEL WITH GFR
BUN/Creatinine Ratio: 17 (ref 12–28)
BUN: 18 mg/dL (ref 8–27)
CO2: 19 mmol/L — ABNORMAL LOW (ref 20–29)
Calcium: 9.6 mg/dL (ref 8.7–10.3)
Chloride: 106 mmol/L (ref 96–106)
Creatinine, Ser: 1.06 mg/dL — ABNORMAL HIGH (ref 0.57–1.00)
Glucose: 116 mg/dL — ABNORMAL HIGH (ref 70–99)
Potassium: 4.7 mmol/L (ref 3.5–5.2)
Sodium: 139 mmol/L (ref 134–144)
eGFR: 59 mL/min/1.73 — ABNORMAL LOW (ref >59)

## 2023-12-14 MED ORDER — EMPAGLIFLOZIN 10 MG PO TABS: 10.0000 mg | ORAL_TABLET | Freq: Every day | ORAL | 2 refills | Status: AC

## 2023-12-14 NOTE — Patient Instructions (Addendum)
 Medication Instructions:  START TAKING JARDIANCE  10 MG DAILY.   Lab Work: BMET TO BE DONE TODAY. BMET, FASTING LIPID PANEL, AND LFTs TO BE DONE 2 WEEKS AFTER TAKING THE JARDIANCE .   Testing/Procedures: NONE  Follow-Up: At [facility], you and your health needs are our priority.  As part of our continuing mission to provide you with exceptional heart care, our providers are all part of one team.  This team includes your primary Cardiologist (physician) and Advanced Practice Providers or APPs (Physician Assistants and Nurse Practitioners) who all work together to provide you with the care you need, when you need it.  Your next appointment:   3 MONTHS  Provider:   Lonni LITTIE Nanas, MD OR [place], [place]

## 2023-12-14 NOTE — Progress Notes (Signed)
 Cardiology Office Note:    Date:  12/14/2023  ID:  Tammy Davies, DOB 03-20-1961, MRN [id number] PCP: Patient, No Pcp Per  [facility] Providers Cardiologist:  Lonni LITTIE Nanas, MD Electrophysiologist:  OLE ONEIDA HOLTS, MD       Patient Profile:       Chief Complaint: 6-week follow-up History of Present Illness:  Tammy Davies is a 63 y.o. 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  Course complicated by persistent atrial fibrillation/flutter.  She was referred to atrial fibrillation clinic and ultimately underwent DCCV x 2 on 06/13/2021 and 07/07/2021.  She was subsequently started on Tikosyn .  She was admitted 08/02/2021 with recurrent rapid atrial fibrillation.  Tikosyn  was discontinued and she was loaded with amiodarone .  Atrial fibrillation/flutter ablation was planned with Dr. HOLTS on 09/29/2021.  However preablation CT was notable for a 2.2 x 1.8 cm mass adjacent to the right coronary ostium with evidence of vascularity.  Differential includes thrombosed coronary aneurysm, saccular thrombosed aortic aneurysm, mediastinal soft tissue mass.  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  Cardiac catheterization on 12/25/2021 showed normal coronary arteries, no connection to mass seen.  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  Echocardiogram 11/2022 showed EF 50 to 55%, normal RV function, no valvular disease.   Patient  presented to the ED on 08/21/2023 in atrial fibrillation.  Her rates were controlled between 100-110.  Patient was given metoprolol  tartrate with improvement in HR.  Patient was discharged home in rate controlled A-fib.   Patient was again seen in the ED on 09/04/2023.  She presented with palpitations, shortness of breath, lightheadedness, dizziness.  Patient was in rapid atrial fibrillation on arrival.  Patient underwent successful DCCV with conversion to sinus rhythm.  Her creatinine was found to be 1.64 and GFR 35.  Patient was seen in clinic on 10/19/2023.  EKG showed she was back in atrial fibrillation.  She had only been taking her Eliquis  and Entresto  once daily due to financial concerns.  She was referred to A-fib clinic.  She was seen by A-fib clinic on 10/28/2023.  She underwent successful cardioversion on 11/09/2023.    She was then seen in the ED on 11/11/2023 with early return of atrial fibrillation. She underwent successful DCCV while in the ED.  She followed up with Dr. HOLTS on 11/12/2023 and is now pending ablation for 02/01/2024.  Echocardiogram on 12/02/2023 showed LVEF 40 to 45%, global hypokinesis, mild LVH, indeterminate diastolic parameters, RV function and size normal, trivial MR.   Discussed the use of AI scribe software for clinical note transcription with the patient, who gave verbal consent to proceed.  History of Present Illness Tammy Davies is a 63 year old female with atrial fibrillation who presents for follow-up.  Today patient tells me she is doing well overall.  She is without acute cardiovascular concerns or complaints at this time.  She denies any chest pains and is without any shortness of breath today.  She denies any lightheadedness, dizziness, syncope, presyncope.  She is currently without cardiac awareness  of her arrhythmia.  She remains adherent to CPAP therapy.  She does not regularly monitor her heart rate at home but is considering purchasing a pulse  oximeter for this purpose.  Review of systems:  Please see the history of present illness. All other systems are reviewed and otherwise negative.      Studies Reviewed:    EKG Interpretation Date/Time:  Tuesday December 14 2023 08:12:58 EDT Ventricular Rate:  98 PR Interval:    QRS Duration:  140 QT Interval:  390 QTC Calculation: 497 R Axis:   -49  Text Interpretation: Atrial fibrillation Left axis deviation Left ventricular hypertrophy with QRS widening ( R in aVL , Cornell product ) Cannot rule out Septal infarct (cited on or before 14-Dec-2023) T wave abnormality, consider lateral ischemia When compared with ECG of 12-Nov-2023 09:02, Atrial fibrillation has replaced Sinus rhythm QRS duration has increased Confirmed by Rana Dixon [phone]) on 12/14/2023 10:09:14 PM    Echocardiogram 12/02/2023 1. Left ventricular ejection fraction, by estimation, is 40 to 45%. The  left ventricle has mildly decreased function. The left ventricle  demonstrates global hypokinesis. The left ventricular internal cavity size  was mildly dilated. There is mild  concentric left ventricular hypertrophy. Left ventricular diastolic  parameters are indeterminate.   2. Right ventricular systolic function is normal. The right ventricular  size is normal. Tricuspid regurgitation signal is inadequate for assessing  PA pressure.   3. Left atrial size was mildly dilated.   4. The mitral valve is normal in structure. Trivial mitral valve  regurgitation. No evidence of mitral stenosis.   5. The aortic valve is tricuspid. Aortic valve regurgitation is not  visualized. No aortic stenosis is present.   6. The inferior vena cava is normal in size with greater than 50%  respiratory variability, suggesting right atrial pressure of 3 mmHg.   7. The patient was in atrial fibrillation.   Echocardiogram 12/09/2022 1. Left ventricular ejection fraction, by estimation, is 50 to 55%. The  left ventricle has low normal  function. The left ventricle has no regional  wall motion abnormalities. There is mild concentric left ventricular  hypertrophy. Left ventricular  diastolic parameters are consistent with Grade I diastolic dysfunction  (impaired relaxation).   2. Right ventricular systolic function is normal. The right ventricular  size is normal. Tricuspid regurgitation signal is inadequate for assessing  PA pressure.   3. The mitral valve is grossly normal. Trivial mitral valve  regurgitation. No evidence of mitral stenosis.   4. The aortic valve is grossly normal. Aortic valve regurgitation is not  visualized. No aortic stenosis is present.   5. The inferior vena cava is normal in size with greater than 50%  respiratory variability, suggesting right atrial pressure of 3 mmHg.   Coronary CTA 09/23/2022 1. Similar appearance of the probable proximal RCA thrombosed aneurysm. Measurements are grossly unchanged, 2.2 x 2.1 x 1.9 cm, compared to CT pulmonary vein study 09/23/21.   2. Minimal CAD, <25% stenosis, CADRADS 1.   3. Coronary calcium  score of 0.   4. Normal coronary origins with right dominance.   RECOMMENDATIONS: CAD-RADS 1. Minimal non-obstructive CAD (0-24%). Consider non-atherosclerotic causes of chest pain. Consider preventive therapy and risk factor modification.  Cardiac catheterization 12/25/2021 Normal right coronary with no connection to a mass or evidence of tumor blush. Normal left main Normal large circumflex LAD is relatively small and has diffuse atherosclerosis but no high-grade obstruction. Mild global hypokinesis with EF 45%.  EDP 13 mmHg  Risk Assessment/Calculations:    CHA2DS2-VASc Score = 3   This indicates a 3.2% annual risk of stroke. The patient's score is based upon: CHF History: 1 HTN History: 1 Diabetes History: 0 Stroke History: 0 Vascular Disease History: 0 Age Score: 0 Gender Score: 1             Physical Exam:   VS:  BP 102/74 (BP Location: Left  Arm, Patient Position: Sitting, Cuff Size: Normal)   Pulse 98   Ht 5' 7 (1.702 m)   Wt 265 lb (120.2 kg)   BMI 41.50 kg/m    Wt Readings from Last 3 Encounters:  12/14/23 265 lb (120.2 kg)  11/12/23 265 lb (120.2 kg)  11/09/23 260 lb (117.9 kg)    GEN: Well nourished, well developed in no acute distress NECK: No JVD; No carotid bruits CARDIAC: Irregular irregular rhythm.  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  LHC on 12/2021 showed normal coronary arteries, no connection to mass seen.  PET scan 01/2022 showed no abnormal FDG avidity and mass consistent with benign etiology, suspect thrombosed aneurysm.  Coronary CTA 09/2022 showed similar appearance of suspected RCA thrombosed aneurysm, measuring 2.1 x 2.2 x 1.9 cm grossly unchanged from prior study - Today she denies any anginal symptoms - The suspected aneurysm is stable in size per most recent coronary CTA 09/2022, we will continue to clinically monitor   Persistent atrial fibrillation/flutter Diagnosed after admission with diverticulitis on 05/2020 and failed DCCV on 07/2020.  Loaded with Tikosyn  and underwent DCCV on 05/2021 but had early return of A-fib.  Successful DCCV 07/2021 also complicated by early return of A-fib.  Ultimately switched to amiodarone  and underwent DCCV on 07/2022 that was successful. Found to be back in A-fib on 08/21/2023 with elevated heart rate, given metoprolol  with improvement in heart rate.  Found in atrial flutter on 09/04/2023 and underwent successful DCCV in the ED Found to be back in A-fib on office  visit 10/19/2023.  Underwent successful DCCV on 11/09/2023 ED visit 11/11/2023 with early return of atrial fibrillation.  Underwent successful DCCV - EKG today shows patient in rate controlled atrial fibrillation - Given her recent DCCVs with early return of atrial fibrillation, another DCCV would not be warranted at this time - Scheduled for ablation 02/01/2024 with Dr. Cindie - Continue amiodarone  100 mg daily and metoprolol  XL 50 mg daily - Continue Eliquis  5 mg twice daily - Management per EP/A-fib clinic   Chronic systolic heart failure Echocardiogram 03/2021 with LVEF 30 to 35% Echocardiogram 08/2021 with LVEF 40 to 45% cMRI 10/2021 with LVEF 46%, septal lateral dyssynchrony consistent with LBBB Echocardiogram 11/2022 with improved LVEF of 50 to 55% Echocardiogram 11/2023 with LVEF 40 to 45% - She has had recent reduction in her LVEF likely in the setting of persistent atrial fibrillation.  She endorses mild dyspnea without leg swelling, orthopnea, PND.  States she is euvolemic and well compensated on exam with no signs of volume overload.  Plan to repeat echocardiogram once patient maintains NSR post ablation - Plan to start Jardiance  10 mg daily to further optimize GDMT -  Further optimization limited due to low normal BP - Current GDMT: Entresto  24-26 mg twice daily, metoprolol  XL 50 mg daily, Jardiance  10 mg daily, and Lasix  20 mg as needed - BMET today   Obstructive sleep apnea - Currently adherent to CPAP therapy   Hypertension Blood pressure today is 102/74 and well-controlled - Continue Entresto  24-26 mg twice daily and metoprolol  XL 50 mg daily  Coronary artery disease Hyperlipidemia Coronary CT 09/2022 showed minimal atherosclerotic plaque in the mid LAD (<25% stenosis) - Today patient is without any anginal symptoms, no indication for ischemic evaluation at this time - No recent lipid panel on file.  Will update fasting lipid panel/LFTs today - Continue rosuvastatin 5 mg  daily       Dispo:  Return in about 3 months (around 03/15/2024).  Signed, Lum LITTIE Louis, NP

## 2023-12-15 ENCOUNTER — Ambulatory Visit: Payer: Self-pay | Admitting: Internal Medicine

## 2023-12-27 ENCOUNTER — Telehealth (HOSPITAL_COMMUNITY): Payer: Self-pay

## 2023-12-27 NOTE — Telephone Encounter (Signed)
 Attempted to reach patient to discuss upcoming procedure, no answer. Unable to leave message, VM full.

## 2023-12-28 LAB — CBC
Hematocrit: 47.7 % — ABNORMAL HIGH (ref 34.0–46.6)
Hemoglobin: 15.3 g/dL (ref 11.1–15.9)
MCH: 29 pg (ref 26.6–33.0)
MCHC: 32.1 g/dL (ref 31.5–35.7)
MCV: 91 fL (ref 79–97)
Platelets: 225 x10E3/uL (ref 150–450)
RBC: 5.27 x10E6/uL (ref 3.77–5.28)
RDW: 14.8 % (ref 11.7–15.4)
WBC: 7.4 x10E3/uL (ref 3.4–10.8)

## 2023-12-28 LAB — BASIC METABOLIC PANEL WITH GFR
BUN/Creatinine Ratio: 19 (ref 12–28)
BUN: 21 mg/dL (ref 8–27)
CO2: 16 mmol/L — ABNORMAL LOW (ref 20–29)
Calcium: 9.5 mg/dL (ref 8.7–10.3)
Chloride: 106 mmol/L (ref 96–106)
Creatinine, Ser: 1.13 mg/dL — ABNORMAL HIGH (ref 0.57–1.00)
Glucose: 98 mg/dL (ref 70–99)
Potassium: 4.5 mmol/L (ref 3.5–5.2)
Sodium: 139 mmol/L (ref 134–144)
eGFR: 55 mL/min/1.73 — ABNORMAL LOW (ref >59)

## 2023-12-29 NOTE — Telephone Encounter (Signed)
 Reached patient to discuss upcoming procedure.   Labs: completed.   Any recent signs of acute illness or been started on antibiotics? Reports she had a toothache and was placed on Amoxicillin  on 7/30- last dose was on 8/9. She reports pain has resolved and denies any active s/o infection. Dr. Cindie made aware. Any medications to hold?  Hold Jardiance  for 3 days prior to the procedure. Last dose on August 11.  Any missed doses of blood thinner? No Advised patient to continue taking ANTICOAGULANT: Eliquis  (Apixaban ) twice daily without missing any doses.  Medication instructions:  On the morning of your procedure DO NOT take any medication., including Eliquis  or the procedure may be rescheduled. Nothing to eat or drink after midnight prior to your procedure.  Confirmed patient is scheduled for Atrial Fibrillation Ablation on Friday, August 15 with Dr. Ole Cindie. Instructed patient to arrive at the Main Entrance A at [facility]: [address] [place], [place] [address] and check in at Admitting at 5:30 AM.  Advised of plan to go home the same day and will only stay overnight if medically necessary. You MUST have a responsible adult to drive you home and MUST be with you the first 24 hours after you arrive home or your procedure could be cancelled.  Patient verbalized understanding to all instructions provided and agreed to proceed with procedure.

## 2023-12-30 NOTE — Anesthesia Preprocedure Evaluation (Addendum)
 Anesthesia Evaluation  Patient identified by MRN, date of birth, ID band Patient awake    Reviewed: Allergy & Precautions, NPO status , Patient's Chart, lab work & pertinent test results, reviewed documented beta blocker date and time   History of Anesthesia Complications Negative for: history of anesthetic complications  Airway Mallampati: III  TM Distance: >3 FB Neck ROM: Full    Dental  (+) Edentulous Upper, Missing, Poor Dentition, Dental Advisory Given   Pulmonary neg shortness of breath, sleep apnea and Continuous Positive Airway Pressure Ventilation , neg COPD, neg recent URI, Current Smoker and Patient abstained from smoking.   breath sounds clear to auscultation       Cardiovascular hypertension, Pt. on medications and Pt. on home beta blockers (-) angina +CHF (LVEF 40-45%)  (-) Past MI + dysrhythmias (eliquis ) Atrial Fibrillation  Rhythm:Regular  Echo 27974  1. Left ventricular ejection fraction, by estimation, is 40 to 45%. The  left ventricle has mildly decreased function. The left ventricle  demonstrates global hypokinesis. The left ventricular internal cavity size  was mildly dilated. There is mild  concentric left ventricular hypertrophy. Left ventricular diastolic  parameters are indeterminate.   2. Right ventricular systolic function is normal. The right ventricular  size is normal. Tricuspid regurgitation signal is inadequate for assessing  PA pressure.   3. Left atrial size was mildly dilated.   4. The mitral valve is normal in structure. Trivial mitral valve  regurgitation. No evidence of mitral stenosis.   5. The aortic valve is tricuspid. Aortic valve regurgitation is not  visualized. No aortic stenosis is present.   6. The inferior vena cava is normal in size with greater than 50%  respiratory variability, suggesting right atrial pressure of 3 mmHg.   7. The patient was in atrial fibrillation.      Neuro/Psych negative neurological ROS     GI/Hepatic negative GI ROS, Neg liver ROS,,,  Endo/Other    Class 3 obesity (BMI 42)  Renal/GU Renal InsufficiencyRenal disease (cr 1.13)Lab Results      Component                Value               Date                      NA                       139                 12/27/2023                K                        4.5                 12/27/2023                CO2                      16 (L)              12/27/2023                GLUCOSE                  98  12/27/2023                BUN                      21                  12/27/2023                CREATININE               1.13 (H)            12/27/2023                CALCIUM                   9.5                 12/27/2023                EGFR                     55 (L)              12/27/2023                GFRNONAA                 49 (L)              11/11/2023                Musculoskeletal negative musculoskeletal ROS (+)    Abdominal   Peds  Hematology negative hematology ROS (+) Lab Results      Component                Value               Date                      WBC                      7.4                 12/27/2023                HGB                      15.3                12/27/2023                HCT                      47.7 (H)            12/27/2023                MCV                      91                  12/27/2023                PLT                      225  12/27/2023              Anesthesia Other Findings   Reproductive/Obstetrics                              Anesthesia Physical Anesthesia Plan  ASA: 3  Anesthesia Plan: General   Post-op Pain Management: Minimal or no pain anticipated   Induction: Intravenous  PONV Risk Score and Plan: 2 and Dexamethasone , Treatment may vary due to age or medical condition, Ondansetron , Propofol  infusion and TIVA  Airway Management Planned: Oral  ETT  Additional Equipment: None  Intra-op Plan:   Post-operative Plan: Extubation in OR  Informed Consent: I have reviewed the patients History and Physical, chart, labs and discussed the procedure including the risks, benefits and alternatives for the proposed anesthesia with the patient or authorized representative who has indicated his/her understanding and acceptance.     Dental advisory given  Plan Discussed with: CRNA  Anesthesia Plan Comments: (HIT w/ heparin - bivalrudin  Bivalrudin dosing:  0.75mg /kg bolus then 1.75 mg/kg/h  ACT monitoring)         Anesthesia Quick Evaluation

## 2023-12-31 ENCOUNTER — Ambulatory Visit (HOSPITAL_COMMUNITY)
Admission: RE | Disposition: A | Discharge status: Home / Self Care | Payer: Self-pay | Source: Home / Self Care | Attending: Cardiology

## 2023-12-31 ENCOUNTER — Other Ambulatory Visit (HOSPITAL_COMMUNITY): Payer: Self-pay

## 2023-12-31 ENCOUNTER — Other Ambulatory Visit: Payer: Self-pay

## 2023-12-31 ENCOUNTER — Ambulatory Visit (HOSPITAL_COMMUNITY): Admitting: Anesthesiology

## 2023-12-31 ENCOUNTER — Ambulatory Visit (HOSPITAL_COMMUNITY)
Admission: RE | Admit: 2023-12-31 | Discharge: 2023-12-31 | Disposition: A | Discharge status: Home / Self Care | Attending: Cardiology | Admitting: Cardiology

## 2023-12-31 DIAGNOSIS — I5020 Unspecified systolic (congestive) heart failure: Secondary | ICD-10-CM | POA: Diagnosis not present

## 2023-12-31 DIAGNOSIS — I110 Hypertensive heart disease with heart failure: Secondary | ICD-10-CM

## 2023-12-31 DIAGNOSIS — Z6841 Body Mass Index (BMI) 40.0 and over, adult: Secondary | ICD-10-CM | POA: Insufficient documentation

## 2023-12-31 DIAGNOSIS — I4819 Other persistent atrial fibrillation: Secondary | ICD-10-CM | POA: Diagnosis present

## 2023-12-31 DIAGNOSIS — Z7901 Long term (current) use of anticoagulants: Secondary | ICD-10-CM | POA: Insufficient documentation

## 2023-12-31 DIAGNOSIS — E66813 Obesity, class 3: Secondary | ICD-10-CM | POA: Insufficient documentation

## 2023-12-31 DIAGNOSIS — I4891 Unspecified atrial fibrillation: Secondary | ICD-10-CM | POA: Diagnosis not present

## 2023-12-31 DIAGNOSIS — I509 Heart failure, unspecified: Secondary | ICD-10-CM | POA: Diagnosis not present

## 2023-12-31 HISTORY — PX: ATRIAL FIBRILLATION ABLATION: EP1191

## 2023-12-31 LAB — POCT ACTIVATED CLOTTING TIME: Activated Clotting Time: 314 s

## 2023-12-31 MED ORDER — SODIUM CHLORIDE 0.9 % IV SOLN: INTRAVENOUS | Status: DC

## 2023-12-31 MED ORDER — ALBUTEROL SULFATE HFA 108 (90 BASE) MCG/ACT IN AERS
INHALATION_SPRAY | RESPIRATORY_TRACT | Status: DC | PRN
  Administered 2023-12-31: 2 via RESPIRATORY_TRACT

## 2023-12-31 MED ORDER — ACETAMINOPHEN 325 MG PO TABS
650.0000 mg | ORAL_TABLET | ORAL | Status: DC | PRN
  Administered 2023-12-31 (×2): 650 mg via ORAL
  Filled 2023-12-31: qty 2

## 2023-12-31 MED ORDER — COLCHICINE 0.6 MG PO TABS
0.6000 mg | ORAL_TABLET | Freq: Two times a day (BID) | ORAL | 0 refills | Status: DC
  Filled 2023-12-31: qty 10, 5d supply, fill #0

## 2023-12-31 MED ORDER — SODIUM CHLORIDE 0.9 % IV SOLN
Freq: Once | INTRAVENOUS | Status: DC
  Filled 2023-12-31: qty 10

## 2023-12-31 MED ORDER — FENTANYL CITRATE (PF) 250 MCG/5ML IJ SOLN
INTRAMUSCULAR | Status: DC | PRN
  Administered 2023-12-31 (×2): 50 ug via INTRAVENOUS

## 2023-12-31 MED ORDER — TRAMADOL HCL 50 MG PO TABS
ORAL_TABLET | ORAL | Status: AC
Start: 2023-12-31 — End: 2023-12-31
  Filled 2023-12-31: qty 1

## 2023-12-31 MED ORDER — ATROPINE SULFATE 1 MG/ML IV SOLN
INTRAVENOUS | Status: DC | PRN
Start: 2023-12-31 — End: 2023-12-31
  Administered 2023-12-31: 1 mg via INTRAVENOUS

## 2023-12-31 MED ORDER — BIVALIRUDIN BOLUS VIA INFUSION
0.7500 mg/kg | Freq: Once | INTRAVENOUS | Status: AC
  Administered 2023-12-31: 85.1 mg via INTRAVENOUS
  Filled 2023-12-31: qty 86

## 2023-12-31 MED ORDER — VASOPRESSIN 20 UNIT/ML IV SOLN
INTRAVENOUS | Status: DC | PRN
  Administered 2023-12-31 (×3): .5 [IU] via INTRAVENOUS

## 2023-12-31 MED ORDER — SODIUM CHLORIDE 0.9 % IV SOLN
Freq: Once | INTRAVENOUS | Status: DC
  Filled 2023-12-31 (×2): qty 10

## 2023-12-31 MED ORDER — SODIUM CHLORIDE 0.9% FLUSH
3.0000 mL | INTRAVENOUS | Status: DC | PRN
Start: 2023-12-31 — End: 2023-12-31

## 2023-12-31 MED ORDER — SUCCINYLCHOLINE CHLORIDE 200 MG/10ML IV SOSY
PREFILLED_SYRINGE | INTRAVENOUS | Status: DC | PRN
  Administered 2023-12-31: 160 mg via INTRAVENOUS

## 2023-12-31 MED ORDER — PHENYLEPHRINE HCL-NACL 20-0.9 MG/250ML-% IV SOLN
INTRAVENOUS | Status: DC | PRN
  Administered 2023-12-31: 40 ug/min via INTRAVENOUS
  Administered 2023-12-31: 75 ug/min via INTRAVENOUS
  Administered 2023-12-31: 50 ug/min via INTRAVENOUS

## 2023-12-31 MED ORDER — ALBUTEROL SULFATE (2.5 MG/3ML) 0.083% IN NEBU
INHALATION_SOLUTION | RESPIRATORY_TRACT | Status: AC
  Administered 2023-12-31: 2.5 mg
  Filled 2023-12-31: qty 3

## 2023-12-31 MED ORDER — PHENYLEPHRINE 80 MCG/ML (10ML) SYRINGE FOR IV PUSH (FOR BLOOD PRESSURE SUPPORT)
PREFILLED_SYRINGE | INTRAVENOUS | Status: DC | PRN
  Administered 2023-12-31 (×2): 160 ug via INTRAVENOUS
  Administered 2023-12-31: 80 ug via INTRAVENOUS

## 2023-12-31 MED ORDER — ROCURONIUM BROMIDE 10 MG/ML (PF) SYRINGE
PREFILLED_SYRINGE | INTRAVENOUS | Status: DC | PRN
  Administered 2023-12-31: 50 mg via INTRAVENOUS

## 2023-12-31 MED ORDER — PROPOFOL 500 MG/50ML IV EMUL
INTRAVENOUS | Status: DC | PRN
  Administered 2023-12-31: 100 ug/kg/min via INTRAVENOUS

## 2023-12-31 MED ORDER — SUGAMMADEX SODIUM 200 MG/2ML IV SOLN
INTRAVENOUS | Status: DC | PRN
  Administered 2023-12-31: 200 mg via INTRAVENOUS
  Administered 2023-12-31: 100 mg via INTRAVENOUS

## 2023-12-31 MED ORDER — MIDAZOLAM HCL 2 MG/2ML IJ SOLN
INTRAMUSCULAR | Status: DC | PRN
  Administered 2023-12-31 (×2): 1 mg via INTRAVENOUS

## 2023-12-31 MED ORDER — FENTANYL CITRATE (PF) 100 MCG/2ML IJ SOLN
INTRAMUSCULAR | Status: AC
Start: 2023-12-31 — End: 2023-12-31
  Filled 2023-12-31: qty 2

## 2023-12-31 MED ORDER — ACETAMINOPHEN 325 MG PO TABS
ORAL_TABLET | ORAL | Status: AC
  Filled 2023-12-31: qty 2

## 2023-12-31 MED ORDER — PROPOFOL 10 MG/ML IV BOLUS
INTRAVENOUS | Status: DC | PRN
  Administered 2023-12-31: 40 mg via INTRAVENOUS
  Administered 2023-12-31: 130 mg via INTRAVENOUS
  Administered 2023-12-31: 30 mg via INTRAVENOUS

## 2023-12-31 MED ORDER — TRAMADOL HCL 50 MG PO TABS
50.0000 mg | ORAL_TABLET | Freq: Once | ORAL | Status: AC
  Administered 2023-12-31: 50 mg via ORAL

## 2023-12-31 MED ORDER — PHENYLEPHRINE HCL-NACL 20-0.9 MG/250ML-% IV SOLN
INTRAVENOUS | Status: AC
  Filled 2023-12-31: qty 500

## 2023-12-31 MED ORDER — LIDOCAINE 2% (20 MG/ML) 5 ML SYRINGE
INTRAMUSCULAR | Status: DC | PRN
Start: 2023-12-31 — End: 2023-12-31
  Administered 2023-12-31: 60 mg via INTRAVENOUS

## 2023-12-31 MED ORDER — SODIUM CHLORIDE 0.9 % IV SOLN
1.7500 mg/kg/h | Freq: Once | INTRAVENOUS | Status: AC
  Administered 2023-12-31: 1.75 mg/kg/h via INTRAVENOUS
  Filled 2023-12-31: qty 250

## 2023-12-31 MED ORDER — ONDANSETRON HCL 4 MG/2ML IJ SOLN: 4.0000 mg | Freq: Four times a day (QID) | INTRAMUSCULAR | Status: DC | PRN

## 2023-12-31 MED ORDER — ONDANSETRON HCL 4 MG/2ML IJ SOLN
INTRAMUSCULAR | Status: DC | PRN
  Administered 2023-12-31: 4 mg via INTRAVENOUS

## 2023-12-31 MED ORDER — PANTOPRAZOLE SODIUM 40 MG PO TBEC
40.0000 mg | DELAYED_RELEASE_TABLET | Freq: Every day | ORAL | 0 refills | Status: DC
  Filled 2023-12-31: qty 30, 30d supply, fill #0

## 2023-12-31 MED ORDER — ALBUTEROL SULFATE (2.5 MG/3ML) 0.083% IN NEBU
2.5000 mg | INHALATION_SOLUTION | RESPIRATORY_TRACT | Status: DC | PRN
  Filled 2023-12-31: qty 3

## 2023-12-31 MED ORDER — COLCHICINE 0.6 MG PO TABS
0.6000 mg | ORAL_TABLET | Freq: Two times a day (BID) | ORAL | Status: DC
  Administered 2023-12-31: 0.6 mg via ORAL
  Filled 2023-12-31: qty 1

## 2023-12-31 MED ORDER — SODIUM CHLORIDE 0.9% FLUSH: 3.0000 mL | Freq: Two times a day (BID) | INTRAVENOUS | Status: DC

## 2023-12-31 MED ORDER — APIXABAN 5 MG PO TABS
5.0000 mg | ORAL_TABLET | Freq: Two times a day (BID) | ORAL | Status: DC
  Administered 2023-12-31: 5 mg via ORAL
  Filled 2023-12-31: qty 1

## 2023-12-31 MED ORDER — MIDAZOLAM HCL 2 MG/2ML IJ SOLN
INTRAMUSCULAR | Status: AC
  Filled 2023-12-31: qty 2

## 2023-12-31 MED ORDER — SODIUM CHLORIDE 0.9 % IV SOLN
250.0000 mL | INTRAVENOUS | Status: DC | PRN
Start: 2023-12-31 — End: 2023-12-31

## 2023-12-31 MED ORDER — PANTOPRAZOLE SODIUM 40 MG PO TBEC
40.0000 mg | DELAYED_RELEASE_TABLET | Freq: Every day | ORAL | Status: DC
  Administered 2023-12-31: 40 mg via ORAL
  Filled 2023-12-31: qty 1

## 2023-12-31 MED ORDER — DEXAMETHASONE SODIUM PHOSPHATE 10 MG/ML IJ SOLN
INTRAMUSCULAR | Status: DC | PRN
  Administered 2023-12-31: 10 mg via INTRAVENOUS

## 2023-12-31 NOTE — H&P (Signed)
 Electrophysiology Office Follow up Visit Note:     Date:  12/31/2023    ID:  Tammy Davies, DOB 1961/03/16, MRN [id number]   PCP:  Patient, No Pcp Per      [facility] Cardiologist:  Lonni LITTIE Nanas, MD  [facility] Electrophysiologist:  OLE ONEIDA HOLTS, MD      Interval History:       Tammy Davies is a 63 y.o. female who presents for a follow up visit.    I last saw the patient July 28, 2022.  She has a history of persistent atrial fibrillation.  She was previously scheduled for an ablation but preop imaging demonstrated a mediastinal soft tissue mass.   This was followed up with a PET which showed no FDG avidity.  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   She saw a [place] in the A-fib clinic October 28, 2023.  She was on amiodarone .  She was back in atrial fibrillation.  Cardioversion was scheduled and performed on November 09, 2023.  She presents today to discuss possible catheter ablation.   She is doing well although can continues to have episodes of atrial fibrillation.  She is very interested in stopping amiodarone .  She continues to take Eliquis  for stroke prophylaxis.  She is interested in proceeding with catheter ablation.  Presents for AF ablation. Procedure reviewed.   Objective Past medical, surgical, social and family history were reviewed.   ROS:   Please see the history of present illness.    All other systems reviewed and are negative.   EKGs/Labs/Other Studies Reviewed:     The following studies were reviewed today:   December 09, 2022 echo EF normal, RV normal, trivial MR     EKG Interpretation Date/Time:                  Friday November 12 2023 09:02:55 EDT Ventricular Rate:         65 PR Interval:                 202 QRS Duration:             120 QT Interval:                 444 QTC Calculation:461 R Axis:                         -59   Text Interpretation:Normal sinus rhythm Confirmed by HOLTS OLE [phone]) on  11/12/2023 9:08:21 AM     Physical Exam:     VS:  BP 124/87   Pulse 94   Ht 5' 7 (1.702 m)   Wt 265 lb (120.2 kg)   SpO2 93%   BMI 41.50 kg/m         Wt Readings from Last 3 Encounters:  11/12/23 265 lb (120.2 kg)  11/09/23 260 lb (117.9 kg)  10/28/23 268 lb 12.8 oz (121.9 kg)      GEN: no distress CARD: RRR, No MRG RESP: No IWOB. CTAB.     Assessment ASSESSMENT:     1. Persistent atrial fibrillation (HCC)   2. Encounter for monitoring amiodarone  therapy     PLAN:     In order of problems listed above:   #Persistent atrial fibrillation #High risk drug monitoring-amiodarone  Recent lab work in June showed stable liver and thyroid  function. I discussed treatment options for her atrial fibrillation today including continuing with amiodarone  versus pursuing catheter  ablation.  I think she is a reasonable candidate to pursue catheter ablation.  I have discussed the procedure and she wishes to proceed.   Continue Eliquis    Discussed treatment options today for AF including antiarrhythmic drug therapy and ablation. Discussed risks, recovery and likelihood of success with each treatment strategy. Risk, benefits, and alternatives to EP study and ablation for afib were discussed. These risks include but are not limited to stroke, bleeding, vascular damage, tamponade, perforation, damage to the esophagus, lungs, phrenic nerve and other structures, pulmonary vein stenosis, worsening renal function, coronary vasospasm and death.  Discussed potential need for repeat ablation procedures and antiarrhythmic drugs after an initial ablation. The patient understands these risk and wishes to proceed.  We will therefore proceed with catheter ablation at the next available time.  Carto, ICE, anesthesia are requested for the procedure.     Presents for AF ablation today. Procedure reviewed.     Signed, Ole Holts, MD, [facility], [facility] 12/31/2023  Electrophysiology [facility]  [facility]

## 2023-12-31 NOTE — Progress Notes (Signed)
 Patient walked to the bathroom without difficulties. Bilateral groins level 0, clean, dry, and intact.

## 2023-12-31 NOTE — Anesthesia Procedure Notes (Signed)
 Procedure Name: Intubation Date/Time: 12/31/2023 7:55 AM  Performed by: Rhodia Debby MATSU, CRNAPre-anesthesia Checklist: Patient identified, Emergency Drugs available, Suction available, Patient being monitored and Timeout performed Patient Re-evaluated:Patient Re-evaluated prior to induction Oxygen  Delivery Method: Circle system utilized Preoxygenation: Pre-oxygenation with 100% oxygen  Induction Type: IV induction Ventilation: Mask ventilation without difficulty Laryngoscope Size: Miller and 2 Grade View: Grade I Tube type: Oral Tube size: 7.0 mm Number of attempts: 1 Airway Equipment and Method: Stylet Placement Confirmation: ETT inserted through vocal cords under direct vision, positive ETCO2 and breath sounds checked- equal and bilateral Secured at: 21 cm Tube secured with: Tape Dental Injury: Dental damage  Comments: Note patient with poor dentition preoperatively front lower incision black gray appearance loose noted with small amount of blood when placing oral airway 90 mm too assist with ventilation noted to be loose and slight blood just prior to DVL just after placing 90 mm oral airway and administering PPV

## 2023-12-31 NOTE — Transfer of Care (Signed)
 Immediate Anesthesia Transfer of Care Note  Patient: Tammy Davies  Procedure(s) Performed: ATRIAL FIBRILLATION ABLATION  Patient Location: cath lab  Anesthesia Type:General  Level of Consciousness: awake, alert , and oriented  Airway & Oxygen  Therapy: Patient Spontanous Breathing and Patient connected to nasal cannula oxygen   Post-op Assessment: Report given to RN and Post -op Vital signs reviewed and stable  Post vital signs: Reviewed and stable  Last Vitals:  Vitals Value Taken Time  BP 92/63 12/31/23 09:50  Temp    Pulse 81 12/31/23 09:51  Resp 14 12/31/23 09:51  SpO2 94 % 12/31/23 09:51  Vitals shown include unfiled device data.  Last Pain:  Vitals:   12/31/23 0547  TempSrc:   PainSc: 0-No pain      Patients Stated Pain Goal: 4 (12/31/23 0547)  Complications: There were no known notable events for this encounter.

## 2023-12-31 NOTE — Discharge Instructions (Signed)

## 2024-01-01 ENCOUNTER — Encounter (HOSPITAL_COMMUNITY): Payer: Self-pay | Admitting: Cardiology

## 2024-01-03 ENCOUNTER — Telehealth (HOSPITAL_COMMUNITY): Payer: Self-pay

## 2024-01-03 NOTE — Telephone Encounter (Signed)
 Spoke with patient to complete post procedure follow up call.  Patient reports no complications with groin sites.   Instructions reviewed with patient:  It is normal to have bruising, tenderness, mild swelling, and a pea or marble sized lump/knot at the groin site which can take up to three months to resolve.  Get help right away if you notice sudden swelling at the puncture site.  Check your puncture site every day for signs of infection: fever, redness, swelling, pus drainage, warmth, foul odor or excessive pain. If this occurs, please call the office at [phone], to speak with the nurse. Get help right away if your puncture site is bleeding and the bleeding does not stop after applying firm pressure to the area.  You may continue to have skipped beats/ atrial fibrillation during the first several months after your procedure.  It is very important not to miss any doses of your blood thinner Eliquis .  You will follow up with the Afib clinic on 01/28/24 and follow up with the APP on 04/03/24.   Patient verbalized understanding to all instructions provided.

## 2024-01-03 NOTE — Anesthesia Postprocedure Evaluation (Signed)
 Anesthesia Post Note  Patient: Tammy Davies  Procedure(s) Performed: ATRIAL FIBRILLATION ABLATION     Patient location during evaluation: Cath Lab Anesthesia Type: General Level of consciousness: awake and alert Pain management: pain level controlled Vital Signs Assessment: post-procedure vital signs reviewed and stable Respiratory status: spontaneous breathing, nonlabored ventilation and respiratory function stable Cardiovascular status: blood pressure returned to baseline and stable Postop Assessment: no apparent nausea or vomiting Anesthetic complications: no   There were no known notable events for this encounter.                  Shakila Mak

## 2024-01-06 MED FILL — Fentanyl Citrate Preservative Free (PF) Inj 100 MCG/2ML: INTRAMUSCULAR | Qty: 2 | Status: AC

## 2024-01-11 ENCOUNTER — Encounter: Payer: Self-pay | Admitting: Internal Medicine

## 2024-01-11 ENCOUNTER — Ambulatory Visit (INDEPENDENT_AMBULATORY_CARE_PROVIDER_SITE_OTHER): Admitting: Internal Medicine

## 2024-01-11 VITALS — BP 110/66 | HR 60 | Temp 97.9°F | Ht 68.0 in | Wt 262.4 lb

## 2024-01-11 DIAGNOSIS — Z7901 Long term (current) use of anticoagulants: Secondary | ICD-10-CM

## 2024-01-11 DIAGNOSIS — E559 Vitamin D deficiency, unspecified: Secondary | ICD-10-CM | POA: Insufficient documentation

## 2024-01-11 DIAGNOSIS — Z9889 Other specified postprocedural states: Secondary | ICD-10-CM | POA: Insufficient documentation

## 2024-01-11 DIAGNOSIS — I4891 Unspecified atrial fibrillation: Secondary | ICD-10-CM

## 2024-01-11 DIAGNOSIS — E785 Hyperlipidemia, unspecified: Secondary | ICD-10-CM | POA: Insufficient documentation

## 2024-01-11 DIAGNOSIS — Z933 Colostomy status: Secondary | ICD-10-CM

## 2024-01-11 DIAGNOSIS — Z1231 Encounter for screening mammogram for malignant neoplasm of breast: Secondary | ICD-10-CM | POA: Diagnosis not present

## 2024-01-11 DIAGNOSIS — H0260 Xanthelasma of unspecified eye, unspecified eyelid: Secondary | ICD-10-CM | POA: Insufficient documentation

## 2024-01-11 DIAGNOSIS — I4819 Other persistent atrial fibrillation: Secondary | ICD-10-CM

## 2024-01-11 DIAGNOSIS — L821 Other seborrheic keratosis: Secondary | ICD-10-CM | POA: Insufficient documentation

## 2024-01-11 DIAGNOSIS — G4733 Obstructive sleep apnea (adult) (pediatric): Secondary | ICD-10-CM

## 2024-01-11 MED ORDER — VITAMIN D (ERGOCALCIFEROL) 1.25 MG (50000 UNIT) PO CAPS: 50000.0000 [IU] | ORAL_CAPSULE | ORAL | 1 refills | Status: DC

## 2024-01-11 NOTE — Progress Notes (Signed)
 [facility] [address] [place] [address] Dept: [phone] Dept Fax: [phone]  New Patient Office Visit  Subjective:   Tammy Davies 10-02-1960 01/11/2024  Chief Complaint  Patient presents with   Establish Care    Spot on face and eye   New Med Request    HPI: Tammy Davies presents today to establish care at [facility]. Introduced to Publishing rights manager role and practice setting.  All questions answered.  Concerns: See below   Discussed the use of AI scribe software for clinical note transcription with the patient, who gave verbal consent to proceed.  History of Present Illness   Tammy Davies is a 63 year old female with atrial fibrillation and heart failure who presents for a new patient visit following a recent hospital admission for an ablation procedure.  She underwent an ablation procedure for atrial fibrillation approximately one to two weeks ago. She has not yet had a follow-up appointment to confirm the success of the procedure, but she believes it has been effective as her vitals have been stable. She is currently managed by her cardiology team and is taking amiodarone  and Eliquis  for her condition.  She has a history of heart failure and is currently taking Entresto  and Jardiance . She also uses Lasix  as needed for fluid buildup and metoprolol  for heart rate control. She mentions that she has been in atrial fibrillation for years prior to the ablation.  She has a history of sleep apnea and uses a CPAP machine, although she often wakes up without it on. She tries to remember to use it but is unsure how long it stays on during the night.  In 2022, she underwent a colectomy and colostomy due to diverticulitis with an abscess. She was informed that reversing the colostomy would require extensive surgery and recovery, so she has opted to keep it as is.  She has a  history of low vitamin D  and high cholesterol. She takes vitamin D  once a week and is on medication for cholesterol management. She has not had her vitamin D  levels checked in years and has not been taking the supplement regularly. She works third shift, which may contribute to her vitamin D  deficiency.  She reports a new skin lesion on her right cheek that appeared about a month ago and a white deposit above her eye that has been present for seven months. The cheek lesion does not itch or cause discomfort, and the eye deposit has not changed in size.      The following portions of the patient's history were reviewed and updated as appropriate: past medical history, past surgical history, family history, social history, allergies, medications, and problem list.   Patient Active Problem List   Diagnosis Date Noted   Xanthelasma 01/11/2024   Vitamin D  deficiency 01/11/2024   History of cardiac ablation for atrial fibrillation (HCC) 01/11/2024   Hyperlipidemia 01/11/2024   Seborrheic keratosis 01/11/2024   Colostomy care (HCC) 11/15/2023   Loose stools 07/17/2022   Irritant contact dermatitis associated with fecal stoma 03/25/2022   Colostomy complication, unspecified (HCC) 03/25/2022   Cardiac mass    A-fib (HCC) 08/02/2021   Atrial fibrillation (HCC) 08/02/2021   OSA (obstructive sleep apnea) 06/10/2021   HFrEF (heart failure with reduced ejection fraction) (HCC) 06/09/2021   Hypercoagulable state due to persistent atrial fibrillation (HCC) 05/26/2021   Ileitis 10/29/2020   Persistent atrial fibrillation (HCC)    Obesity (  BMI 30-39.9) 06/18/2020   Chronic anticoagulation 06/18/2020   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   Hypoxia 06/18/2020   Protein-calorie malnutrition, moderate (HCC) 06/18/2020   Status post cystoscopy with ureteral stent placement 06/18/2020   Hematuria due to cystitis from perforated diverticulitis 06/18/2020   History of COVID-19    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 06/19/2020 06/06/2020   Hyperglycemia 06/08/2017   Hypertension 06/08/2017   S/P laparoscopic cholecystectomy 08/20/2016   Tobacco abuse 08/20/2016   Pancreatitis 08/15/2016   Liver mass, 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) Echocardiogram 1/19: EF 60-65   History of COVID-19    Hypertension 06/08/2017   OSA (obstructive sleep apnea)    Persistent atrial fibrillation (HCC)    Persistent since admx w diverticulitis in 1/22  //  failed DCCV in 3/22  //  Dofetilide  load in 1/23 >> failed DCCV x 2   Tobacco abuse    Past Surgical History:  Procedure Laterality Date   ABDOMINAL HYSTERECTOMY     ATRIAL FIBRILLATION ABLATION N/A 12/31/2023   Procedure: ATRIAL FIBRILLATION ABLATION;  Surgeon: Cindie Ole DASEN, MD;  Location: [facility] INVASIVE CV LAB;  Service: Cardiovascular;  Laterality: N/A;   CARDIOVERSION N/A 07/22/2020   Procedure: CARDIOVERSION;  Surgeon: Mona Vinie BROCKS, MD;  Location: [facility] ENDOSCOPY;  Service: Cardiovascular;  Laterality: N/A;   CARDIOVERSION N/A 05/28/2021   Procedure: CARDIOVERSION;  Surgeon: Barbaraann Darryle Ned, MD;  Location: [facility] ENDOSCOPY;  Service: Cardiovascular;  Laterality: N/A;   CARDIOVERSION N/A 06/13/2021   Procedure: CARDIOVERSION;  Surgeon: Sheena Pugh, DO;  Location: [facility] ENDOSCOPY;  Service: Cardiovascular;  Laterality: N/A;   CARDIOVERSION N/A 07/15/2021   Procedure: CARDIOVERSION;  Surgeon: Cindie Ole DASEN, MD;  Location: [facility] INVASIVE CV LAB;  Service: Cardiovascular;  Laterality: N/A;   CARDIOVERSION N/A 11/09/2023   Procedure: CARDIOVERSION;  Surgeon: Loni Soyla LABOR, MD;   Location: [facility] INVASIVE CV LAB;  Service: Cardiovascular;  Laterality: N/A;   CHOLECYSTECTOMY N/A 08/18/2016   Procedure: LAPAROSCOPIC CHOLECYSTECTOMY WITH  INTRAOPERATIVE CHOLANGIOGRAM;  Surgeon: Donnice Lunger, MD;  Location: [facility] ORS;  Service: General;  Laterality: N/A;   COLECTOMY WITH COLOSTOMY CREATION/HARTMANN PROCEDURE N/A 06/18/2020   Procedure: SIGMOID COLECTOMY WITH COLOSTOMY CREATION; DRAINAGE OF PELVIC ABSCESS;  Surgeon: Ebbie Donnice, MD;  Location: [facility] OR;  Service: General;  Laterality: N/A;   CYSTOSCOPY WITH STENT PLACEMENT Bilateral 06/18/2020   Procedure: CYSTOSCOPY WITH STENT PLACEMENT;  Surgeon: Alvaro Hummer, MD;  Location: [facility] OR;  Service: Urology;  Laterality: Bilateral;   LAPAROTOMY N/A 06/18/2020   Procedure: EXPLORATORY LAPAROTOMY;  Surgeon: Ebbie Donnice, MD;  Location: [facility] OR;  Service: General;  Laterality: N/A;   LEFT HEART CATH AND CORONARY ANGIOGRAPHY N/A 12/25/2021   Procedure: LEFT HEART CATH AND CORONARY ANGIOGRAPHY;  Surgeon: Claudene Victory ORN, MD;  Location: [facility] INVASIVE CV LAB;  Service: Cardiovascular;  Laterality: N/A;   Family History  Problem Relation Age of Onset   Heart failure Mother    Breast cancer Mother    Atrial fibrillation Mother    Atrial fibrillation Father    Heart failure Father    Diabetes Father    Colon cancer Neg Hx  Stomach cancer Neg Hx    Esophageal cancer Neg Hx    Pancreatic cancer Neg Hx    Liver disease Neg Hx     Current Outpatient Medications:    acetaminophen  (TYLENOL ) 500 MG tablet, Take 1,000 mg by mouth every 6 (six) hours as needed for mild pain, fever or headache., Disp: , Rfl:    amiodarone  (PACERONE ) 200 MG tablet, Take 1 tablet by mouth once daily, Disp: 30 tablet, Rfl: 11   apixaban  (ELIQUIS ) 5 MG TABS tablet, Take 1 tablet (5 mg total) by mouth 2 (two) times daily., Disp: 180 tablet, Rfl: 1   empagliflozin  (JARDIANCE ) 10 MG TABS tablet, Take 1 tablet (10 mg total) by mouth daily before breakfast., Disp: 90  tablet, Rfl: 2   ENTRESTO  24-26 MG, Take 1 tablet by mouth twice daily, Disp: 180 tablet, Rfl: 3   furosemide  (LASIX ) 20 MG tablet, Take 1 tablet (20 mg total) by mouth daily as needed for fluid or edema (As needed for weight gain of 3 lb overnight or 5 lb in one week)., Disp: 90 tablet, Rfl: 2   Magnesium  250 MG TABS, Take 250 mg by mouth daily., Disp: , Rfl:    metoprolol  succinate (TOPROL -XL) 50 MG 24 hr tablet, Take 1 tablet by mouth once daily, Disp: 90 tablet, Rfl: 3   pantoprazole  (PROTONIX ) 40 MG tablet, Take 1 tablet (40 mg total) by mouth daily. Post procedure medication, does not need refills., Disp: 45 tablet, Rfl: 0   potassium chloride  (KLOR-CON ) 10 MEQ tablet, Take 1 tablet by mouth once daily, Disp: 30 tablet, Rfl: 5   rosuvastatin (CRESTOR) 5 MG tablet, Take 5 mg by mouth daily., Disp: , Rfl:    apixaban  (ELIQUIS ) 5 MG TABS tablet, Take 1 tablet (5 mg total) by mouth 2 (two) times daily., Disp: 28 tablet, Rfl: 0   colchicine  0.6 MG tablet, Take 1 tablet (0.6 mg total) by mouth 2 (two) times daily for 5 days. (Patient not taking: Reported on 01/11/2024), Disp: 10 tablet, Rfl: 0   Vitamin D , Ergocalciferol , (DRISDOL ) 1.25 MG (50000 UNIT) CAPS capsule, Take 1 capsule (50,000 Units total) by mouth once a week., Disp: 12 capsule, Rfl: 1 Allergies  Allergen Reactions   Heparin  Other (See Comments)    06/23/20 HIT AB ODT =3 very high likelihood of true HIT; 2/7 SRA negative    ROS: A complete ROS was performed with pertinent positives/negatives noted in the HPI. The remainder of the ROS are negative.   Objective:   Today's Vitals   01/11/24 1434  BP: 110/66  Pulse: 60  Temp: 97.9 F (36.6 C)  TempSrc: Temporal  SpO2: 97%  Weight: 262 lb 6.4 oz (119 kg)  Height: 5' 8 (1.727 m)    GENERAL: Well-appearing, in NAD. Well nourished.  SKIN: Pink, warm and dry. < 0.5cm waxy raised brown lesion to right cheek. Raised non-tender white-yellow lesion above left eyelid NECK: Trachea  midline. Full ROM w/o pain or tenderness. No lymphadenopathy.  RESPIRATORY: Chest wall symmetrical. Respirations even and non-labored. Breath sounds clear to auscultation bilaterally.  CARDIAC: S1, S2 present, regular rate and rhythm. Peripheral pulses 2+ bilaterally.  EXTREMITIES: Without clubbing, cyanosis, or edema.  NEUROLOGIC: No motor or sensory deficits. Steady, even gait.  PSYCH/MENTAL STATUS: Alert, oriented x 3. Cooperative, appropriate mood and affect.   Health Maintenance Due  Topic Date Due   MAMMOGRAM  Never done   Pneumococcal Vaccine: 50+ Years (1 of 2 - PCV) Never done  Cervical Cancer Screening (HPV/Pap Cotest)  Never done   COVID-19 Vaccine (4 - Moderna risk 2024-25 season) 07/19/2023   INFLUENZA VACCINE  12/17/2023    No results found for any visits on 01/11/24.  Assessment & Plan:  Assessment and Plan    Atrial fibrillation, post-ablation, on amiodarone  and anticoagulation Status post-ablation for atrial fibrillation, managed with amiodarone  and Eliquis . No recurrence of symptoms, indicating effective ablation. - Continue amiodarone  and Eliquis . - Follow up with cardiology next week or the week after as scheduled.  Heart failure, on Entresto  and Jardiance  Chronic heart failure managed with Entresto  and Jardiance . No symptoms of fluid overload or exacerbation. - Continue Entresto  and Jardiance . - Monitor for symptoms of fluid overload. 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 Reversal not pursued due to extensive damage and potential prolonged recovery. - Continue current colostomy care.  Obstructive sleep apnea, suboptimally treated with CPAP Obstructive sleep apnea with suboptimal CPAP use. Difficulty keeping CPAP on throughout the night. - Encourage consistent CPAP use.  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  Xanthelasma is a cholesterol deposit. - Continue current lipid-lowering therapy. - Monitor cholesterol levels.  Seborrheic keratosis, right cheek Seborrheic keratosis on the right cheek. Non-cancerous, no treatment necessary unless bothersome. - Provide information on seborrheic keratosis.  Vitamin D  deficiency Vitamin D  deficiency with increased risk due to night shift work. Has not taken supplements recently. - Prescribe vitamin D  supplementation once a week. - Check vitamin D  levels in three months.       Orders Placed This Encounter  Procedures   MM 3D SCREENING MAMMOGRAM BILATERAL BREAST    Standing Status:   Future    Expiration Date:   01/10/2025    Reason for Exam (SYMPTOM  OR DIAGNOSIS REQUIRED):   screening for breast cancer    Preferred imaging location?:   [facility]   Meds ordered this encounter  Medications   Vitamin D , Ergocalciferol , (DRISDOL ) 1.25 MG (50000 UNIT) CAPS capsule    Sig: Take 1 capsule (50,000 Units total) by mouth once a week.    Dispense:  12 capsule    Refill:  1    Return in about 3 months (around 04/12/2024) for Chronic Condition follow up - fasting blood work at appointment.   Rosina Senters, FNP

## 2024-01-11 NOTE — Patient Instructions (Signed)
  VISIT SUMMARY: You had a follow-up visit today after your recent hospital admission for an ablation procedure to treat your atrial fibrillation. We discussed your current medications and overall health, including your heart failure, sleep apnea, colostomy, cholesterol levels, and vitamin D  deficiency. You also mentioned a new skin lesion on your right cheek and a white deposit above your eye.  YOUR PLAN: -ATRIAL FIBRILLATION, POST-ABLATION: Atrial fibrillation is an irregular and often rapid heart rate. You recently had an ablation procedure to treat this condition, and it seems to be effective as you have had no recurrence of symptoms. Continue taking amiodarone  and Eliquis  as prescribed. Please follow up with your cardiology team next week or the week after.  -HEART FAILURE: Heart failure means your heart doesn't pump blood as well as it should. Your condition is being managed with Entresto  and Jardiance , and you have no symptoms of fluid overload. Continue taking these medications and monitor for any signs of fluid buildup.  -COLOSTOMY STATUS POST COLECTOMY FOR DIVERTICULITIS WITH ABSCESS: A colostomy is an opening in your belly made during surgery to remove part of your colon. You had this surgery in 2022 due to diverticulitis with an abscess. Continue with your current colostomy care routine.  -OBSTRUCTIVE SLEEP APNEA: Obstructive sleep apnea is a condition where your breathing stops and starts during sleep. You are using a CPAP machine, but it often comes off during the night. Try to use the CPAP machine consistently to improve your sleep quality.  -HYPERLIPIDEMIA WITH XANTHELASMA: Hyperlipidemia means you have high cholesterol levels, and xanthelasma are cholesterol deposits that appear on your skin. Continue your current cholesterol-lowering medications and monitor your cholesterol levels.  -SEBORRHEIC KERATOSIS, RIGHT CHEEK: Seborrheic keratosis is a non-cancerous skin growth. The lesion on  your right cheek does not require treatment unless it becomes bothersome. Information on seborrheic keratosis will be provided.  -VITAMIN D  DEFICIENCY: Vitamin D  deficiency means you have low levels of vitamin D , which is important for bone health. 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 Start taking vitamin D  supplements once a week and we will check your vitamin D  levels in three months.  INSTRUCTIONS: Please follow up with your cardiology team next week or the week after to confirm the success of your ablation procedure. Additionally, we will check your vitamin D  levels in three months to ensure they are improving with the supplementation.                      Contains text generated by [employer]bridge.                                 Contains text generated by [employer]bridge.

## 2024-01-13 ENCOUNTER — Other Ambulatory Visit: Payer: Self-pay | Admitting: Physician Assistant

## 2024-01-28 ENCOUNTER — Ambulatory Visit (HOSPITAL_COMMUNITY)
Admit: 2024-01-28 | Discharge: 2024-01-28 | Disposition: A | Discharge status: Home / Self Care | Attending: Physician Assistant | Admitting: Physician Assistant

## 2024-01-28 VITALS — BP 140/70 | HR 58 | Ht 68.0 in | Wt 266.2 lb

## 2024-01-28 DIAGNOSIS — I4891 Unspecified atrial fibrillation: Secondary | ICD-10-CM | POA: Diagnosis not present

## 2024-01-28 DIAGNOSIS — I4819 Other persistent atrial fibrillation: Secondary | ICD-10-CM

## 2024-01-28 DIAGNOSIS — Z79899 Other long term (current) drug therapy: Secondary | ICD-10-CM | POA: Diagnosis not present

## 2024-01-28 DIAGNOSIS — Z5181 Encounter for therapeutic drug level monitoring: Secondary | ICD-10-CM

## 2024-01-28 DIAGNOSIS — D6869 Other thrombophilia: Secondary | ICD-10-CM

## 2024-01-28 NOTE — Progress Notes (Signed)
 Primary Care Physician: Billy Knee, FNP Referring Physician: Vin Bhagat Cardiologist: Dr. Kate Primary EP: Dr Cindie Almarie Tammy Davies is a 63 y.o. 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  She was found to have persistent afib/flutter and was on Cardizem  IV, transitioned to oral prior to d/c. She was on heparin  for a while perioperatively but transitioned back to eliquis  prior to d/c. She was also positive for Covid on admission but asymptomatic and has had all vaccines.   She was seen by Vin Bhagat, PA, and still had RVR in the 120's and he added low dose BB to her daily Cardizem . However, pt misunderstood and stopped her CCB, still in atrial flutter(typical) today at 127 bpm. She states that she does not feel her heart rhythm and is tolerating this well. She expects to have a reverse colostomy in 6 months.   F/u in the afib clinic, 07/26/20. At time of cardioversion , she went from atrail flutter with 3-4 AVB, to afib with RVR at 160 bpm. She eas cardioverted 2 additional times but did not convert. She was given esmolol  per anesthesiology and  was d/c in afib in the 90's. The ekg shows today that she remains in afib at 62 bpm.she states that the afib is not bothering her and she feels well. She is pending having her colonoscopy reversed in June. She continues on BB/CCB for heart rate control.   F/u in afib clinic, 03/04/21. Initially, she thought that she may have  colostomy reversed in June, however this did not occur. She was admitted in June with abdominal pain and terminal ileitis. She asked for this appointment as she has been feeling more short of breath. In SR with controlled v rate today. She mentions now that reversal may not be until January if at all, as the surgeon told her she has a lot of scar tissue.  She would like to try to restore SR. She has not noted any fluid retention. Her weight is up 12 lbs. She feels this represents true weight gain. She has cut back smoking to one pack a week. When she was in the hospital in June, she was told that she needed a sleep study as she had snoring and  desaturation in her O2 with sleep.   04/16/21, Pt is here to f/u up on echo that showed moderately reduced EF at 30-35%. I  discussed with Dr. Claudene and he thought probably [facility] and would try to restore sinus rhythm to improve EF, medication adjustment to address EFdysfunction and after restoring SR, he will consider further if ischemic w/u is needed. Weight is up a few lbs, but she states not fluid. I cannot appreciate any fluid and pt states that her diet is terrible. She has cut back but continues to smoke. She actually states that her breathing is improved as compared when she asked to be seen in October. She is agreeable to coming into hospital for Tikosyn  but wants to wait until early January.   Follow up in the AF clinic 05/26/21. Patient presents for dofetilide  admission. She denies any missed doses of anticoagulation in the last 3 weeks.   Follow up in the AF clinic 06/05/21. Patient is s/p dofetilide  loading 1/9-1/12/23 with DCCV on 05/28/21. She reports that she felt great for 3 days after leaving the hospital.  Unfortunately, she started feeling dyspnea on exertion on the fourth day and ECG shows afib today. She is very disappointed she is back in afib. There were no specific triggers that she could identify.   Follow up in the AF clinic 08/05/21. Patient is s/p DCCV x 2 on 06/13/21 and 07/15/21. She was admitted 08/02/21 with recorrence of her rapid afib. Dofetilide  was discontinued and she was loaded on amiodarone  which converted her to SR. She is scheduled for an afib and flutter ablation with Dr Cindie on 09/29/21. She reports that she feels well today, no CP or increased SOB. She is on CPAP now.   Follow up  in the AF clinic 08/13/22. Patient was scheduled for ablation with Dr Cindie but she was found to have a mass around the RCA origin. Ablation was cancelled. 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 She was seen by Dr Cindie 07/28/22 and her amiodarone  was decreased. She presented to the ED 08/01/22 with dizziness and SOB. ECG demonstrated atrial flutter and she underwent DCCV at that time. There were no specific triggers that she could identify. She is in SR today and feeling well.   Follow up in the AF clinic 02/09/23. Patient reports that she has done well since her last visit. She has not had any interim symptoms of afib. No bleeding issues on anticoagulation.   Follow up 10/28/23. Patient returns for follow up for atrial fibrillation and amiodarone  monitoring. Patient was found to be back in afib at her follow up on 6/3. She reports that she had noticed increased fatigue and dyspnea with exertion for the past two months. Unfortunately, she changed insurance companies and had only been taking Eliquis  once daily due to a gap in coverage. She has been taking twice daily since 6/3.  Follow up 01/28/24. Patient returns for follow up for atrial fibrillation and amiodarone  monitoring. She is s/p afib ablation on 12/31/23. She states that her SOB and fatigue have improved, she is able to walk farther. She denies chest pain or groin issues. No bleeding issues on anticoagulation.   Today, she  denies symptoms of palpitations, chest pain, shortness of breath, orthopnea, PND, lower extremity edema, dizziness, presyncope, syncope, snoring, daytime somnolence, bleeding, or neurologic sequela. The patient is tolerating medications without difficulties and is otherwise without complaint today.    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 (Patient taking differently: Take 1,000 mg by mouth as needed for mild pain (pain score 1-3), fever or headache.)     amiodarone  (PACERONE ) 200 MG tablet Take 1 tablet by mouth once daily (Patient taking differently: Take 100 mg by mouth  daily.) 30 tablet 11   apixaban  (ELIQUIS ) 5 MG TABS tablet Take 1 tablet (5 mg total) by mouth 2 (two) times daily. 180 tablet 1   apixaban  (ELIQUIS ) 5 MG TABS tablet Take 1 tablet (5 mg total) by mouth 2 (two) times daily. 28 tablet 0   empagliflozin  (JARDIANCE ) 10 MG TABS tablet Take 1 tablet (10 mg total) by mouth daily before breakfast. 90 tablet 2   ENTRESTO  24-26 MG Take 1 tablet by mouth twice daily 180 tablet 3   furosemide  (LASIX ) 20 MG tablet Take 1 tablet (20 mg total) by mouth daily as needed for fluid or edema (As needed for weight gain of 3 lb overnight or 5 lb in one week). (Patient taking differently: Take 20 mg by mouth as needed for fluid or edema (As needed for weight gain of 3 lb overnight or 5 lb in one week).) 90 tablet 2   Magnesium  250 MG TABS Take 250 mg by mouth daily.     metoprolol  succinate (TOPROL -XL) 50 MG 24 hr tablet Take 1 tablet by mouth once daily 90 tablet 3   pantoprazole  (PROTONIX ) 40 MG tablet Take 1 tablet (40 mg total) by mouth daily. Post procedure medication, does not need refills. 45  tablet 0   potassium chloride  (KLOR-CON ) 10 MEQ tablet Take 1 tablet by mouth once daily 30 tablet 5   rosuvastatin (CRESTOR) 5 MG tablet Take 5 mg by mouth daily.     Vitamin D , Ergocalciferol , (DRISDOL ) 1.25 MG (50000 UNIT) CAPS capsule Take 1 capsule (50,000 Units total) by mouth once a week. 12 capsule 1   No current facility-administered medications for this encounter.    ROS- All systems are reviewed and negative except as per the HPI above  Physical Exam: Vitals:   01/28/24 0813  BP: (!) 140/70  Pulse: (!) 58  Weight: 120.7 kg  Height: 5' 8 (1.727 m)    Wt Readings from Last 3 Encounters:  01/28/24 120.7 kg  01/11/24 119 kg  12/31/23 113.4 kg    GEN: Well nourished, well developed in no acute distress CARDIAC: Regular rate and rhythm, no murmurs, rubs, gallops RESPIRATORY:  Clear to auscultation without rales, wheezing or rhonchi  ABDOMEN: Soft, non-tender, non-distended EXTREMITIES:  No edema; No deformity     EKG today demonstrates SB, LBBB Vent. rate 58 BPM PR interval 174 ms QRS duration 152 ms QT/QTcB 476/467 ms   Echo 12/09/22  1. Left ventricular ejection fraction, by estimation, is 50 to 55%. The  left ventricle has low normal function. The left ventricle has no regional  wall motion abnormalities. There is mild concentric left ventricular  hypertrophy. Left ventricular diastolic parameters are consistent with Grade I diastolic dysfunction (impaired relaxation).   2. Right ventricular systolic function is normal. The right ventricular  size is normal. Tricuspid regurgitation signal is inadequate for assessing  PA pressure.   3. The mitral valve is grossly normal. Trivial mitral valve  regurgitation. No evidence of mitral stenosis.   4. The aortic valve is grossly normal. Aortic valve regurgitation is not  visualized. No aortic stenosis is present.   5. The inferior vena cava is normal in size with greater than 50%  respiratory variability,  suggesting right atrial pressure of 3 mmHg.   Comparison(s): Changes from prior study are noted. The left ventricular  function has improved.    CHA2DS2-VASc Score = 3  The patient's score is based upon: CHF History: 1 HTN History: 1 Diabetes History: 0  Stroke History: 0 Vascular Disease History: 0 Age Score: 0 Gender Score: 1       ASSESSMENT AND PLAN: Persistent Atrial Fibrillation/atrial flutter (ICD10:  I48.19) The patient's CHA2DS2-VASc score is 3, indicating a 3.2% annual risk of stroke.   Previously failed dofetilide . S/p afib ablation 12/31/23 Patient appears to be maintaining SR Continue amiodarone  200 mg daily for now. Continue Eliquis  5 mg BID with no missed doses for 3 months post ablation.  Continue Toprol  50 mg daily  Secondary Hypercoagulable State (ICD10:  D68.69) The patient is at significant risk for stroke/thromboembolism based upon her CHA2DS2-VASc Score of 3.  Continue Apixaban  (Eliquis ). No bleeding issues.   High Risk Medication Monitoring (ICD 10: [id number]) Patient requires ongoing monitoring for anti-arrhythmic medication which has the potential to cause life threatening arrhythmias. Intervals on ECG acceptable for amiodarone  monitoring.   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  Encouraged lifestyle modification  OSA  Encouraged nightly CPAP   Follow up in the AF clinic in 2 months.     Daril Kicks PA-C [facility] [address] [place], [place] [address] [phone]

## 2024-03-14 NOTE — Progress Notes (Unsigned)
 Cardiology Office Note:    Date:  03/15/2024   ID:  Tammy Davies, DOB [birth year], MRN [id number]  PCP:  Billy Knee, FNP  Cardiologist:  Lonni LITTIE Nanas, MD  Electrophysiologist:  OLE ONEIDA HOLTS, MD   Referring MD: No ref. provider found   Chief Complaint  Patient presents with   Atrial Fibrillation    History of Present Illness:    Tammy Davies is a 63 y.o. female with a hx of persistent atrial fibrillation, hypertension, tobacco use.  She was admitted 06/05/2020 through 06/28/2020 with sigmoid diverticulitis complicated by abscess, underwent Hartmann colectomy/colostomy.  Course complicated by persistent atrial fibrillation/flutter.  She was referred to A-fib clinic and ultimately underwent DCCV x2 on 06/13/2021 and 06/2021.  She was started on Tikosyn .  She was admitted 08/02/2021 with recurrent rapid A-fib.  Tikosyn  was discontinued and she was loaded with amiodarone .  A-fib/flutter ablation was planned with Dr. Holts on 09/29/2021.  However preablation CT was notable for 2.2 x 1.8 cm mass adjacent to right coronary ostium with evidence of vascularity.  Differential included thrombosed coronary aneurysm, saccular thrombosed aortic aneurysm, or mediastinal soft tissue mass.  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  LHC 12/25/2021 showed normal coronary arteries, no connection to mass seen.  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  Echocardiogram 12/09/2022 showed EF 50 to 55%, normal RV function, no significant valvular disease.  Underwent A-fib ablation with Dr. Holts 12/31/2023.  Echocardiogram 12/02/2023 showed EF 40 to 45%.  Since last clinic visit, she reports she is doing well.   Denies any recent chest pain.  Reports some dyspnea but stable.  Denies any lightheadedness, syncope, lower extremity edema.  Reports has had some brief palpitations but has not felt like she has had any more A-fib since her ablation.  She has not needed Lasix  in months.  She is smoking 1 to 2 cigarettes/day.  She denies any bleeding on Eliquis .   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) Echocardiogram 1/19: EF 60-65   History of COVID-19    Hypertension 06/08/2017   OSA (obstructive sleep apnea)    Persistent atrial fibrillation (HCC)    Persistent since admx w diverticulitis in 1/22  //  failed DCCV in 3/22  //  Dofetilide  load in 1/23 >> failed DCCV x 2   Tobacco abuse     Past Surgical History:  Procedure Laterality Date   ABDOMINAL HYSTERECTOMY     ATRIAL FIBRILLATION ABLATION N/A 12/31/2023   Procedure: ATRIAL FIBRILLATION ABLATION;  Surgeon: Holts Ole ONEIDA, MD;  Location: [facility] INVASIVE CV LAB;  Service: Cardiovascular;  Laterality: N/A;   CARDIOVERSION N/A 07/22/2020   Procedure: CARDIOVERSION;  Surgeon: Mona Vinie BROCKS, MD;  Location: [facility] ENDOSCOPY;  Service: Cardiovascular;  Laterality: N/A;   CARDIOVERSION N/A 05/28/2021   Procedure: CARDIOVERSION;  Surgeon: Barbaraann Darryle Ned, MD;  Location: [facility] ENDOSCOPY;  Service: Cardiovascular;  Laterality: N/A;   CARDIOVERSION N/A 06/13/2021   Procedure: CARDIOVERSION;  Surgeon: Sheena Pugh, DO;  Location: [facility] ENDOSCOPY;  Service: Cardiovascular;  Laterality: N/A;   CARDIOVERSION  N/A 07/15/2021   Procedure: CARDIOVERSION;  Surgeon: Cindie Ole DASEN, MD;  Location: [facility] INVASIVE CV LAB;  Service: Cardiovascular;  Laterality: N/A;   CARDIOVERSION N/A 11/09/2023   Procedure: CARDIOVERSION;  Surgeon: Loni Soyla LABOR, MD;  Location: [facility] INVASIVE CV LAB;  Service: Cardiovascular;  Laterality: N/A;   CHOLECYSTECTOMY N/A 08/18/2016   Procedure: LAPAROSCOPIC CHOLECYSTECTOMY WITH  INTRAOPERATIVE CHOLANGIOGRAM;  Surgeon: Donnice Lunger, MD;  Location: [facility] ORS;  Service: General;  Laterality: N/A;   COLECTOMY WITH COLOSTOMY CREATION/HARTMANN PROCEDURE N/A 06/18/2020   Procedure: SIGMOID COLECTOMY WITH COLOSTOMY CREATION; DRAINAGE OF PELVIC ABSCESS;  Surgeon: Ebbie Donnice, MD;  Location: [facility] OR;  Service: General;  Laterality: N/A;   CYSTOSCOPY WITH STENT PLACEMENT Bilateral 06/18/2020   Procedure: CYSTOSCOPY WITH STENT PLACEMENT;  Surgeon: Alvaro Hummer, MD;  Location: [facility] OR;  Service: Urology;  Laterality: Bilateral;   LAPAROTOMY N/A 06/18/2020   Procedure: EXPLORATORY LAPAROTOMY;  Surgeon: Ebbie Donnice, MD;  Location: [facility] OR;  Service: General;  Laterality: N/A;   LEFT HEART CATH AND CORONARY ANGIOGRAPHY N/A 12/25/2021   Procedure: LEFT HEART CATH AND CORONARY ANGIOGRAPHY;  Surgeon: Claudene Victory ORN, MD;  Location: [facility] INVASIVE CV LAB;  Service: Cardiovascular;  Laterality: N/A;    Current Medications: Current Meds  Medication Sig   acetaminophen  (TYLENOL ) 500 MG tablet Take 1,000 mg by mouth every 6 (six) hours as needed for mild pain, fever or headache.   amiodarone  (PACERONE ) 200 MG tablet Take 1 tablet by mouth once daily   apixaban  (ELIQUIS ) 5 MG TABS tablet Take 1 tablet (5 mg total) by mouth 2 (two) times daily.   empagliflozin  (JARDIANCE ) 10 MG TABS tablet Take 1 tablet (10 mg total) by mouth daily before breakfast.   ENTRESTO  24-26 MG Take 1 tablet by mouth twice daily   furosemide  (LASIX ) 20 MG tablet Take 1 tablet (20 mg total) by mouth daily as needed for fluid or  edema (As needed for weight gain of 3 lb overnight or 5 lb in one week).   Magnesium  250 MG TABS Take 250 mg by mouth daily.   metoprolol  succinate (TOPROL -XL) 50 MG 24 hr tablet Take 1 tablet by mouth once daily   rosuvastatin (CRESTOR) 5 MG tablet Take 5 mg by mouth daily.   spironolactone (ALDACTONE) 25 MG tablet Take 0.5 tablets (12.5 mg total) by mouth daily.   Vitamin D , Ergocalciferol , (DRISDOL ) 1.25 MG (50000 UNIT) CAPS capsule Take 1 capsule (50,000 Units total) by mouth once a week.   [DISCONTINUED] potassium chloride  (KLOR-CON ) 10 MEQ tablet Take 1 tablet by mouth once daily   [DISCONTINUED] spironolactone (ALDACTONE) 25 MG tablet Take 1 tablet (25 mg total) by mouth daily.   [DISCONTINUED] spironolactone (ALDACTONE) 25 MG tablet Take 1 tablet (25 mg total) by mouth daily.     Allergies:   Heparin    Social History   Socioeconomic History   Marital status: Single    Spouse name: Not on file   Number of children: Not on file   Years of education: Not on file   Highest education level: Not on file  Occupational History   Not on file  Tobacco Use   Smoking status: Every  Day    Types: Cigarettes    Start date: 1977   Smokeless tobacco: Never   Tobacco comments:    A Pack every 3 weeks 02/09/23  Vaping Use   Vaping status: Never Used  Substance and Sexual Activity   Alcohol use: No   Drug use: Yes    Types: Marijuana    Comment: last used Christmas   Sexual activity: Never  Other Topics Concern   Not on file  Social History Narrative   Not on file   Social Drivers of Health   Financial Resource Strain: Not on file  Food Insecurity: Not on file  Transportation Needs: Not on file  Physical Activity: Not on file  Stress: Not on file  Social Connections: Unknown (02/02/2023)   Received from [employer]   Social Network    Social Network: Not on file     Family History: The patient's family history includes Atrial fibrillation in her father and mother; Breast  cancer in her mother; Diabetes in her father; Heart failure in her father and mother. There is no history of Colon cancer, Stomach cancer, Esophageal cancer, Pancreatic cancer, or Liver disease.  ROS:   Please see the history of present illness.     All other systems reviewed and are negative.  EKGs/Labs/Other Studies Reviewed:    The following studies were reviewed today:   EKG:   12/18/21: sinus bradycardia, rate 52, LBBB 11/10/2022: Sinus bradycardia, rate 59, left axis deviation, LVH, 1 mm ST depressions in V4-6 03/15/2024: Sinus rhythm, rate 66, left bundle branch block  Recent Labs: 09/04/2023: Magnesium  2.1 10/28/2023: ALT 14; TSH 3.500 12/27/2023: BUN 21; Creatinine, Ser 1.13; Hemoglobin 15.3; Platelets 225; Potassium 4.5; Sodium 139  Recent Lipid Panel    Component Value Date/Time   CHOL 173 06/08/2017 0819   TRIG 124 06/22/2020 0935   HDL 40 (L) 06/08/2017 0819   CHOLHDL 4.3 06/08/2017 0819   VLDL 23 06/08/2017 0819   LDLCALC 110 (H) 06/08/2017 0819    Physical Exam:    VS:  BP 128/70 (BP Location: Right Arm, Patient Position: Sitting, Cuff Size: Normal)   Pulse 66   Ht 5' 8.4 (1.737 m)   Wt 266 lb 12.8 oz (121 kg)   BMI 40.09 kg/m     Wt Readings from Last 3 Encounters:  03/15/24 266 lb 12.8 oz (121 kg)  01/28/24 266 lb 3.2 oz (120.7 kg)  01/11/24 262 lb 6.4 oz (119 kg)     GEN:  Well nourished, well developed in no acute distress HEENT: Normal NECK: No JVD; No carotid bruits CARDIAC: RRR, no murmurs, rubs, gallops RESPIRATORY:  Clear to auscultation without rales, wheezing or rhonchi  ABDOMEN: Soft, non-tender, non-distended MUSCULOSKELETAL:  trace edema; No deformity  SKIN: Warm and dry NEUROLOGIC:  Alert and oriented x 3 PSYCHIATRIC:  Normal affect   ASSESSMENT:    1. Persistent atrial fibrillation (HCC)   2. Cardiac mass   3. Essential hypertension   4. Hyperlipidemia, unspecified hyperlipidemia type   5. Morbid obesity (HCC)   6. 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  Differential included thrombosed coronary aneurysm, saccular thrombosed aortic aneurysm, or mediastinal soft tissue mass.  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  LHC 12/25/2021 showed normal coronary arteries, no connection to mass  seen.  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  Failed DCCV 07/2020.  Loaded with Tikosyn  and underwent DCCV 05/2021 but had early return of A-fib.  DCCV 07/2021 also complicated by early return of A-fib.  Switched to amiodarone .  Underwent DCCV 08/01/2022.  Currently on amiodarone  200 mg daily, appears to be maintaining sinus rhythm.  On Eliquis  5 mg twice daily for anticoagulation.  On Toprol -XL 50 mg daily.  Follows with Dr. Cindie in EP, underwent ablation 12/31/23.  -Would consider stopping amiodarone  3 months post ablation  Chronic systolic heart failure: Echocardiogram 04/07/2021 with EF 30 to 35%, global hypokinesis.  Echocardiogram 09/10/2021 EF 40 to 45%, global hypokinesis.  Cardiac MRI 11/14/2021 EF 46%, septal lateral dyssynchrony consistent with LBBB, no myocardial LGE.  Suspect tachycardia induced versus due to left bundle branch block.  No CAD on cath 12/2021.  Echocardiogram 11/2022 showed EF had improved to 50 to 55%.  Echocardiogram 12/02/2023 showed EF 40 to 45%. -Continue Entresto  24-26 mg twice daily -Continue  Toprol -XL 50 mg daily -Continue Jardiance  10 mg daily -Add spironolactone 12.5 mg daily -Continue Lasix  20 mg as needed.  Check CMET, magnesium   OSA: On CPAP  Hypertension: On Entresto , Toprol -XL.  Add spironolactone as above  Tobacco use: Counseled of risk of tobacco use and cessation strongly encouraged.  She has significantly cut back on her smoking, down to about 1 to 2 cigarettes/day.  Encourage complete cessation  Obesity: Body mass index is 40.09 kg/m.  Refer to healthy weight and wellness   RTC in 6 months  Medication Adjustments/Labs and Tests Ordered: Current medicines are reviewed at length with the patient today.  Concerns regarding medicines are outlined above.  Orders Placed This Encounter  Procedures   MR CARDIAC MORPHOLOGY W WO CONTRAST   CBC   Comprehensive Metabolic Panel (CMET)   TSH   Magnesium    Amb Ref to Medical Weight Management   EKG 12-Lead   Meds ordered this encounter  Medications   DISCONTD: spironolactone (ALDACTONE) 25 MG tablet    Sig: Take 1 tablet (25 mg total) by mouth daily.    Dispense:  90 tablet    Refill:  3   DISCONTD: spironolactone (ALDACTONE) 25 MG tablet    Sig: Take 1 tablet (25 mg total) by mouth daily.    Dispense:  90 tablet    Refill:  3   spironolactone (ALDACTONE) 25 MG tablet    Sig: Take 0.5 tablets (12.5 mg total) by mouth daily.    Dispense:  90 tablet    Refill:  3    Patient Instructions  Medication Instructions:  Stop potassium as directed by your provider Start Spirolactone 25 mg daily Your physician recommends that you continue on your current medications as directed. Please refer to the Current Medication list given to you today.  *If you need a refill on your cardiac medications before your next appointment, please call your pharmacy*  Lab Work: Cmet, tsh, mg, cbc today If you have labs (blood work) drawn today and your tests are completely normal, you will receive your results only by: MyChart  Message (if you have MyChart)  OR A paper copy in the mail If you have any lab test that is abnormal or we need to change your treatment, we will call you to review the results.  Testing/Procedures: Your physician has requested that you have a cardiac MRI. Cardiac MRI uses a computer to create images of your heart as its beating, producing both still and moving pictures of your heart and major blood vessels. For further information please visit [email or web address]. Please follow the instruction sheet given to you today for more information.   Follow-Up: At [facility], you and your health needs are our priority.  As part of our continuing mission to provide you with exceptional heart care, our providers are all part of one team.  This team includes your primary Cardiologist (physician) and Advanced Practice Providers or APPs (Physician Assistants and Nurse Practitioners) who all work together to provide you with the care you need, when you need it.  Your next appointment:   3 months  Provider:   Dr. Kate   We recommend signing up for the patient portal called MyChart.  Sign up information is provided on this After Visit Summary.  MyChart is used to connect with patients for Virtual Visits (Telemedicine).  Patients are able to view lab/test results, encounter notes, upcoming appointments, etc.  Non-urgent messages can be sent to your provider as well.   To learn more about what you can do with MyChart, go to [email or web address].   Other Instructions Referral to Healthy Weight and wellness          Signed, Lonni LITTIE Kate, MD  03/15/2024 11:13 AM    [facility]

## 2024-03-15 ENCOUNTER — Other Ambulatory Visit (HOSPITAL_COMMUNITY): Payer: Self-pay

## 2024-03-15 ENCOUNTER — Encounter: Payer: Self-pay | Admitting: Cardiology

## 2024-03-15 ENCOUNTER — Telehealth: Payer: Self-pay | Admitting: Cardiology

## 2024-03-15 ENCOUNTER — Ambulatory Visit: Attending: Cardiology | Admitting: Cardiology

## 2024-03-15 VITALS — BP 128/70 | HR 66 | Ht 68.4 in | Wt 266.8 lb

## 2024-03-15 DIAGNOSIS — E785 Hyperlipidemia, unspecified: Secondary | ICD-10-CM | POA: Diagnosis not present

## 2024-03-15 DIAGNOSIS — I5189 Other ill-defined heart diseases: Secondary | ICD-10-CM | POA: Diagnosis not present

## 2024-03-15 DIAGNOSIS — I10 Essential (primary) hypertension: Secondary | ICD-10-CM | POA: Diagnosis not present

## 2024-03-15 DIAGNOSIS — I4819 Other persistent atrial fibrillation: Secondary | ICD-10-CM

## 2024-03-15 DIAGNOSIS — Z720 Tobacco use: Secondary | ICD-10-CM

## 2024-03-15 MED ORDER — SPIRONOLACTONE 25 MG PO TABS: 12.5000 mg | ORAL_TABLET | Freq: Every day | ORAL | 3 refills | Status: AC

## 2024-03-15 MED ORDER — SPIRONOLACTONE 25 MG PO TABS
25.0000 mg | ORAL_TABLET | Freq: Every day | ORAL | 3 refills | Status: DC
Start: 2024-03-15 — End: 2024-03-15

## 2024-03-15 MED ORDER — SPIRONOLACTONE 25 MG PO TABS
25.0000 mg | ORAL_TABLET | Freq: Every day | ORAL | 3 refills | Status: DC
  Filled 2024-03-15: qty 90, 90d supply, fill #0

## 2024-03-15 NOTE — Telephone Encounter (Signed)
 Pt c/o medication issue:  1. Name of Medication: ENTRESTO  24-26 MG .  spironolactone (ALDACTONE) 25 MG tablet    2. How are you currently taking this medication (dosage and times per day)? N/A  3. Are you having a reaction (difficulty breathing--STAT)? No  4. What is your medication issue? Pharmacist would like to know if the pt should take both medication.

## 2024-03-15 NOTE — Patient Instructions (Signed)
 Medication Instructions:  Stop potassium as directed by your provider Start Spirolactone 25 mg daily Your physician recommends that you continue on your current medications as directed. Please refer to the Current Medication list given to you today.  *If you need a refill on your cardiac medications before your next appointment, please call your pharmacy*  Lab Work: Cmet, tsh, mg, cbc today If you have labs (blood work) drawn today and your tests are completely normal, you will receive your results only by: MyChart Message (if you have MyChart) OR A paper copy in the mail If you have any lab test that is abnormal or we need to change your treatment, we will call you to review the results.  Testing/Procedures: Your physician has requested that you have a cardiac MRI. Cardiac MRI uses a computer to create images of your heart as its beating, producing both still and moving pictures of your heart and major blood vessels. For further information please visit [email or web address]. Please follow the instruction sheet given to you today for more information.   Follow-Up: At [facility], you and your health needs are our priority.  As part of our continuing mission to provide you with exceptional heart care, our providers are all part of one team.  This team includes your primary Cardiologist (physician) and Advanced Practice Providers or APPs (Physician Assistants and Nurse Practitioners) who all work together to provide you with the care you need, when you need it.  Your next appointment:   3 months  Provider:   Dr. Kate   We recommend signing up for the patient portal called MyChart.  Sign up information is provided on this After Visit Summary.  MyChart is used to connect with patients for Virtual Visits (Telemedicine).  Patients are able to view lab/test results, encounter notes, upcoming appointments, etc.  Non-urgent messages can be sent to your provider as well.   To learn more  about what you can do with MyChart, go to [email or web address].   Other Instructions Referral to Healthy Weight and wellness

## 2024-03-15 NOTE — Telephone Encounter (Signed)
 Left detailed message for the pharmacist, we are aware of the medications and labs will be checked.

## 2024-03-20 ENCOUNTER — Telehealth: Payer: Self-pay | Admitting: Cardiology

## 2024-03-20 LAB — CBC

## 2024-03-20 NOTE — Telephone Encounter (Signed)
 Fax rec'vd from [facility] stating risk of hyperkalemia taking entreston and spironolactone. Please advise pharmacy.Fax scanned in media

## 2024-03-20 NOTE — Telephone Encounter (Signed)
 This was handled last week by Adrien Conquest, RN.  Please see that phone note for further information.

## 2024-03-21 ENCOUNTER — Ambulatory Visit: Payer: Self-pay | Admitting: Cardiology

## 2024-03-21 ENCOUNTER — Encounter: Payer: Self-pay | Admitting: Cardiology

## 2024-03-21 LAB — MAGNESIUM: Magnesium: 2.2 mg/dL (ref 1.6–2.3)

## 2024-03-21 LAB — COMPREHENSIVE METABOLIC PANEL WITH GFR
ALT: 19 IU/L (ref 0–32)
AST: 16 IU/L (ref 0–40)
Albumin: 4.2 g/dL (ref 3.9–4.9)
Alkaline Phosphatase: 140 IU/L — ABNORMAL HIGH (ref 49–135)
BUN/Creatinine Ratio: 12 (ref 12–28)
BUN: 12 mg/dL (ref 8–27)
Bilirubin Total: 0.4 mg/dL (ref 0.0–1.2)
CO2: 23 mmol/L (ref 20–29)
Calcium: 9.6 mg/dL (ref 8.7–10.3)
Chloride: 105 mmol/L (ref 96–106)
Creatinine, Ser: 0.99 mg/dL (ref 0.57–1.00)
Globulin, Total: 2.3 g/dL (ref 1.5–4.5)
Glucose: 80 mg/dL (ref 70–99)
Potassium: 4.9 mmol/L (ref 3.5–5.2)
Sodium: 141 mmol/L (ref 134–144)
Total Protein: 6.5 g/dL (ref 6.0–8.5)
eGFR: 64 mL/min/1.73 (ref >59)

## 2024-03-21 LAB — CBC
Hematocrit: 47.5 % — AB (ref 34.0–46.6)
Hemoglobin: 15.7 g/dL (ref 11.1–15.9)
MCH: 29.7 pg (ref 26.6–33.0)
MCHC: 33.1 g/dL (ref 31.5–35.7)
MCV: 90 fL (ref 79–97)
Platelets: 227 x10E3/uL (ref 150–450)
RBC: 5.29 x10E6/uL — AB (ref 3.77–5.28)
RDW: 13.7 % (ref 11.7–15.4)
WBC: 7 x10E3/uL (ref 3.4–10.8)

## 2024-03-21 LAB — TSH: TSH: 3.3 u[IU]/mL (ref 0.450–4.500)

## 2024-03-21 NOTE — Telephone Encounter (Signed)
 This was handled last week by Adrien Conquest, RN.  Please see that phone note for further information.

## 2024-03-21 NOTE — Telephone Encounter (Signed)
 Pharmacy calling again. Referrenced phone note from Adrien Conquest stating  we are aware of the medications and labs will be checked.     Please advise.

## 2024-04-03 ENCOUNTER — Ambulatory Visit (HOSPITAL_COMMUNITY): Admitting: Physician Assistant

## 2024-04-03 ENCOUNTER — Ambulatory Visit: Admitting: Student

## 2024-04-04 ENCOUNTER — Ambulatory Visit (HOSPITAL_COMMUNITY)
Admission: RE | Admit: 2024-04-04 | Discharge: 2024-04-04 | Disposition: A | Discharge status: Home / Self Care | Source: Ambulatory Visit | Attending: Physician Assistant | Admitting: Physician Assistant

## 2024-04-04 VITALS — BP 142/80 | HR 65 | Ht 68.4 in | Wt 270.4 lb

## 2024-04-04 DIAGNOSIS — D6869 Other thrombophilia: Secondary | ICD-10-CM

## 2024-04-04 DIAGNOSIS — I4891 Unspecified atrial fibrillation: Secondary | ICD-10-CM | POA: Diagnosis not present

## 2024-04-04 DIAGNOSIS — I4819 Other persistent atrial fibrillation: Secondary | ICD-10-CM | POA: Diagnosis not present

## 2024-04-04 NOTE — Progress Notes (Signed)
 Primary Care Physician: Billy Knee, FNP Referring Physician: Vin Bhagat Cardiologist: Dr. Kate Primary EP: Dr Cindie Almarie Tammy Davies is a 63 y.o. 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  She was found to have persistent afib/flutter and was on Cardizem  IV, transitioned to oral prior to d/c. She was on heparin  for a while perioperatively but transitioned back to eliquis  prior to d/c. She was also positive for Covid on admission but asymptomatic and has had all vaccines.   She was seen by Vin Bhagat, PA, and still had RVR in the 120's and he added low dose BB to her daily Cardizem . However, pt misunderstood and stopped her CCB, still in atrial flutter(typical) today at 127 bpm. She states that she does not feel her heart rhythm and is tolerating this well. She expects to have a reverse colostomy in 6 months.   F/u in the afib clinic, 07/26/20. At time of cardioversion , she went from atrail flutter with 3-4 AVB, to afib with RVR at 160 bpm. She eas cardioverted 2 additional times but did not convert. She was given esmolol  per anesthesiology and  was d/c in afib in the 90's. The ekg shows today that she remains in afib at 53 bpm.she states that the afib is not bothering her and she feels well. She is pending having her colonoscopy reversed in June. She continues on BB/CCB for heart rate control.   F/u in afib clinic, 03/04/21. Initially, she thought that she may have  colostomy reversed in June, however this did not occur. She was admitted in June with abdominal pain and terminal ileitis. She asked for this appointment as she has been feeling more short of breath. In SR with controlled v rate today. She mentions now that reversal may not be until January if at all, as the surgeon told her she has a lot of scar tissue.  She would like to try to restore SR. She has not noted any fluid retention. Her weight is up 12 lbs. She feels this represents true weight gain. She has cut back smoking to one pack a week. When she was in the hospital in June, she was told that she needed a sleep study as she had snoring and  desaturation in her O2 with sleep.   04/16/21, Pt is here to f/u up on echo that showed moderately reduced EF at 30-35%. I  discussed with Dr. Claudene and he thought probably [facility] and would try to restore sinus rhythm to improve EF, medication adjustment to address EFdysfunction and after restoring SR, he will consider further if ischemic w/u is needed. Weight is up a few lbs, but she states not fluid. I cannot appreciate any fluid and pt states that her diet is terrible. She has cut back but continues to smoke. She actually states that her breathing is improved as compared when she asked to be seen in October. She is agreeable to coming into hospital for Tikosyn  but wants to wait until early January.   Follow up in the AF clinic 05/26/21. Patient presents for dofetilide  admission. She denies any missed doses of anticoagulation in the last 3 weeks.   Follow up in the AF clinic 06/05/21. Patient is s/p dofetilide  loading 1/9-1/12/23 with DCCV on 05/28/21. She reports that she felt great for 3 days after leaving the hospital.  Unfortunately, she started feeling dyspnea on exertion on the fourth day and ECG shows afib today. She is very disappointed she is back in afib. There were no specific triggers that she could identify.   Follow up in the AF clinic 08/05/21. Patient is s/p DCCV x 2 on 06/13/21 and 07/15/21. She was admitted 08/02/21 with recorrence of her rapid afib. Dofetilide  was discontinued and she was loaded on amiodarone  which converted her to SR. She is scheduled for an afib and flutter ablation with Dr Cindie on 09/29/21. She reports that she feels well today, no CP or increased SOB. She is on CPAP now.   Follow up  in the AF clinic 08/13/22. Patient was scheduled for ablation with Dr Cindie but she was found to have a mass around the RCA origin. Ablation was cancelled. 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 She was seen by Dr Cindie 07/28/22 and her amiodarone  was decreased. She presented to the ED 08/01/22 with dizziness and SOB. ECG demonstrated atrial flutter and she underwent DCCV at that time. There were no specific triggers that she could identify. She is in SR today and feeling well.   Follow up in the AF clinic 02/09/23. Patient reports that she has done well since her last visit. She has not had any interim symptoms of afib. No bleeding issues on anticoagulation.   Follow up 10/28/23. Patient returns for follow up for atrial fibrillation and amiodarone  monitoring. Patient was found to be back in afib at her follow up on 6/3. She reports that she had noticed increased fatigue and dyspnea with exertion for the past two months. Unfortunately, she changed insurance companies and had only been taking Eliquis  once daily due to a gap in coverage. She has been taking twice daily since 6/3.  Follow up 01/28/24. Patient returns for follow up for atrial fibrillation and amiodarone  monitoring. She is s/p afib ablation on 12/31/23. She states that her SOB and fatigue have improved, she is able to walk farther. She denies chest pain or groin issues. No bleeding issues on anticoagulation.   Follow up 04/04/24. Patient returns for follow up for atrial fibrillation. She reports that she has done well since her last visit with no interim symptoms of afib. No bleeding issues on anticoagulation.   Today, she  denies symptoms of palpitations, chest pain, shortness of breath, orthopnea, PND, lower extremity edema, dizziness, presyncope, syncope, bleeding, or neurologic sequela. The patient is tolerating medications without difficulties and is otherwise without complaint today.    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 180 tablet 1   empagliflozin  (JARDIANCE ) 10 MG TABS tablet Take 1 tablet (10  mg total) by mouth daily before breakfast. 90 tablet 2   ENTRESTO  24-26 MG Take 1 tablet by mouth twice daily 180 tablet 3   furosemide  (LASIX ) 20 MG tablet Take 1 tablet (20 mg total) by mouth daily as needed for fluid or edema (As needed for weight gain of 3 lb overnight or 5 lb in one week). (Patient taking differently: Take 20 mg by mouth as needed for fluid or edema (As needed for weight gain of 3 lb overnight or 5 lb in one week).) 90 tablet 2   Magnesium  250 MG TABS Take 250 mg by mouth daily.     metoprolol  succinate (TOPROL -XL) 50 MG 24 hr tablet Take 1 tablet by mouth once daily 90 tablet 3   rosuvastatin (CRESTOR) 5 MG tablet Take 5 mg by mouth daily.     spironolactone (ALDACTONE) 25 MG tablet Take 0.5 tablets (12.5 mg total) by mouth daily. 90 tablet 3   Vitamin D , Ergocalciferol , (DRISDOL ) 1.25 MG (50000 UNIT) CAPS capsule Take 1 capsule (50,000 Units total) by mouth once a week. 12 capsule 1   acetaminophen  (TYLENOL ) 500 MG tablet Take 1,000 mg by mouth every 6 (six) hours as needed for mild pain, fever or headache.      amiodarone  (PACERONE ) 200 MG tablet Take 1 tablet by mouth once daily 30 tablet 11   apixaban  (ELIQUIS ) 5 MG TABS tablet Take 1 tablet (5 mg total) by mouth 2 (two) times daily. 28 tablet 0   No current facility-administered medications for this encounter.    ROS- All systems are reviewed and negative except as per the HPI above  Physical Exam: Vitals:   04/04/24 0826  Weight: 122.7 kg  Height: 5' 8.4 (1.737 m)    Wt Readings from Last 3 Encounters:  04/04/24 122.7 kg  03/15/24 121 kg  01/28/24 120.7 kg    GEN: Well nourished, well developed in no acute distress CARDIAC: Regular rate and rhythm, no murmurs, rubs, gallops RESPIRATORY:  Clear to auscultation without rales, wheezing or rhonchi  ABDOMEN: Soft, non-tender, non-distended EXTREMITIES:  No edema; No deformity    EKG today demonstrates SR, LBBB Vent. rate 65 BPM PR interval 174 ms QRS duration 138 ms QT/QTcB 432/449 ms   Echo 12/09/22  1. Left ventricular ejection fraction, by estimation, is 50 to 55%. The  left ventricle has low normal function. The left ventricle has no regional  wall motion abnormalities. There is mild concentric left ventricular  hypertrophy. Left ventricular diastolic parameters are consistent with Grade I diastolic dysfunction (impaired relaxation).   2. Right ventricular systolic function is normal. The right ventricular  size is normal. Tricuspid regurgitation signal is inadequate for assessing  PA pressure.   3. The mitral valve is grossly normal. Trivial mitral valve  regurgitation. No evidence of mitral stenosis.   4. The aortic valve is grossly normal. Aortic valve regurgitation is not  visualized. No aortic stenosis is present.   5. The inferior vena cava is normal in size with greater than 50%  respiratory variability, suggesting right atrial pressure of 3 mmHg.   Comparison(s): Changes from prior study are noted. The left ventricular  function has improved.    CHA2DS2-VASc  Score = 3  The patient's score is based upon: CHF History: 1 HTN History: 1 Diabetes History: 0 Stroke History: 0 Vascular Disease History: 0 Age Score: 0 Gender Score: 1       ASSESSMENT AND PLAN: Persistent Atrial Fibrillation/atrial flutter (ICD10:  I48.19) The  patient's CHA2DS2-VASc score is 3, indicating a 3.2% annual risk of stroke.   Previously failed dofetilide  S/p afib ablation 12/31/23 Patient appears to be maintaining SR Will stop amiodarone  today.  Continue Eliquis  5 mg BID Continue Toprol  50 mg daily  Secondary Hypercoagulable State (ICD10:  D68.69) The patient is at significant risk for stroke/thromboembolism based upon her CHA2DS2-VASc Score of 3.  Continue Apixaban  (Eliquis ). No bleeding issues.   HFmrEF EF 40-45% GDMT per primary cardiology team Fluid status appears stable today She has had issues with frequent urination and urinary incontinence on spironolactone. She plans to discuss with Dr Kate about stopping this medication.   HTN Stable on current regimen  Obesity Body mass index is 40.63 kg/m.  Encouraged lifestyle modification  OSA  Encouraged nightly CPAP   Follow up with EP in 6 months.   Daril Kicks PA-C [facility] [address] [place], [place] [address] [phone]

## 2024-04-10 ENCOUNTER — Telehealth: Payer: Self-pay

## 2024-04-10 ENCOUNTER — Other Ambulatory Visit: Payer: Self-pay | Admitting: Cardiology

## 2024-04-10 MED ORDER — LORAZEPAM 0.5 MG PO TABS: ORAL_TABLET | ORAL | 0 refills | Status: AC

## 2024-04-10 MED ORDER — LORAZEPAM 0.5 MG PO TABS: ORAL_TABLET | ORAL | 0 refills | Status: DC

## 2024-04-10 NOTE — Telephone Encounter (Signed)
 Spoke to patient Dr.Schumann prescribed Ativan  0.5 mg to take 30 to 60 mins before cardiac mri.Prescription called in to your [place] pharmacy.

## 2024-04-10 NOTE — Progress Notes (Signed)
 Error

## 2024-04-11 ENCOUNTER — Ambulatory Visit: Admitting: Internal Medicine

## 2024-04-11 ENCOUNTER — Encounter: Payer: Self-pay | Admitting: Internal Medicine

## 2024-04-11 VITALS — BP 132/80 | HR 72 | Temp 97.8°F | Ht 68.0 in | Wt 268.4 lb

## 2024-04-11 DIAGNOSIS — R0981 Nasal congestion: Secondary | ICD-10-CM | POA: Diagnosis not present

## 2024-04-11 DIAGNOSIS — E559 Vitamin D deficiency, unspecified: Secondary | ICD-10-CM

## 2024-04-11 DIAGNOSIS — Z1231 Encounter for screening mammogram for malignant neoplasm of breast: Secondary | ICD-10-CM

## 2024-04-11 DIAGNOSIS — I10 Essential (primary) hypertension: Secondary | ICD-10-CM | POA: Diagnosis not present

## 2024-04-11 DIAGNOSIS — G4733 Obstructive sleep apnea (adult) (pediatric): Secondary | ICD-10-CM

## 2024-04-11 LAB — VITAMIN D 25 HYDROXY (VIT D DEFICIENCY, FRACTURES): VITD: 24.8 ng/mL — ABNORMAL LOW (ref 30.00–100.00)

## 2024-04-11 MED ORDER — VITAMIN D (ERGOCALCIFEROL) 1.25 MG (50000 UNIT) PO CAPS: 50000.0000 [IU] | ORAL_CAPSULE | ORAL | 1 refills | Status: AC

## 2024-04-11 NOTE — Patient Instructions (Signed)
 Coricidin HBP - cold and cough . Decongestant that won't raise blood pressure

## 2024-04-11 NOTE — Progress Notes (Signed)
 [facility] [address] [place] [address] Dept: [phone] Dept Fax: [phone]    Subjective:   Tammy Davies 1961-01-01 04/11/2024  Chief Complaint  Patient presents with   Follow-up    3 months things are good, pt had coffee with cream    HPI: Tammy Davies presents today for re-assessment and management of chronic medical conditions.  Discussed the use of AI scribe software for clinical note transcription with the patient, who gave verbal consent to proceed.  History of Present Illness   Tammy Davies is a 63 year old female who presents for a routine follow-up visit.  She attributes her recent nasal congestion to taking Sudafed, which she believes may have affected her blood pressure. She has experienced congestion for two days. No fever, chills, cough, CP, SHOB.  She has a history of vitamin D  deficiency and requires a refill of her vitamin D  prescription.   She has a history of atrial fibrillation and reports significant improvement in her symptoms following an ablation procedure. She is currently not taking spironolactone  due to concerns about incontinence, which she attributes to her current medications, including Jardiance  and Lasix  (as needed).  She uses a CPAP machine for sleep apnea but finds it difficult to keep it on throughout the night. She sometimes wakes up with it off.  She received her flu and pneumococcal vaccines at a pharmacy. She has not yet scheduled her mammogram.       The following portions of the patient's history were reviewed and updated as appropriate: past medical history, past surgical history, family history, social history, allergies, medications, and problem list.   Patient Active Problem List   Diagnosis Date Noted   Xanthelasma 01/11/2024   Vitamin D  deficiency 01/11/2024   History of cardiac ablation for atrial fibrillation (HCC) 01/11/2024    Hyperlipidemia 01/11/2024   Seborrheic keratosis 01/11/2024   Colostomy care (HCC) 11/15/2023   Loose stools 07/17/2022   Irritant contact dermatitis associated with fecal stoma 03/25/2022   Colostomy complication, unspecified (HCC) 03/25/2022   Cardiac mass    A-fib (HCC) 08/02/2021   Atrial fibrillation (HCC) 08/02/2021   OSA (obstructive sleep apnea) 06/10/2021   HFrEF (heart failure with reduced ejection fraction) (HCC) 06/09/2021   Hypercoagulable state due to persistent atrial fibrillation (HCC) 05/26/2021   Ileitis 10/29/2020   Persistent atrial fibrillation (HCC)    Obesity (BMI 30-39.9) 06/18/2020   Chronic anticoagulation 06/18/2020   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   Hypoxia 06/18/2020   Protein-calorie malnutrition, moderate 06/18/2020   Status post cystoscopy with ureteral stent placement 06/18/2020   Hematuria due to cystitis from perforated diverticulitis 06/18/2020   History of COVID-19    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 06/19/2020 06/06/2020   Hyperglycemia 06/08/2017   Hypertension 06/08/2017   S/P laparoscopic cholecystectomy 08/20/2016   Tobacco abuse 08/20/2016   Pancreatitis 08/15/2016   Liver mass, 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) Echocardiogram 1/19: EF 60-65   History of COVID-19    Hypertension 06/08/2017   OSA (obstructive sleep apnea)    Persistent atrial fibrillation (HCC)    Persistent  since admx w diverticulitis in 1/22  //  failed DCCV in 3/22  //  Dofetilide  load in 1/23 >> failed DCCV x 2   Tobacco abuse    Past Surgical History:  Procedure Laterality Date    ABDOMINAL HYSTERECTOMY     ATRIAL FIBRILLATION ABLATION N/A 12/31/2023   Procedure: ATRIAL FIBRILLATION ABLATION;  Surgeon: Cindie Ole DASEN, MD;  Location: [facility] INVASIVE CV LAB;  Service: Cardiovascular;  Laterality: N/A;   CARDIOVERSION N/A 07/22/2020   Procedure: CARDIOVERSION;  Surgeon: Mona Vinie BROCKS, MD;  Location: [facility] ENDOSCOPY;  Service: Cardiovascular;  Laterality: N/A;   CARDIOVERSION N/A 05/28/2021   Procedure: CARDIOVERSION;  Surgeon: Barbaraann Darryle Ned, MD;  Location: [facility] ENDOSCOPY;  Service: Cardiovascular;  Laterality: N/A;   CARDIOVERSION N/A 06/13/2021   Procedure: CARDIOVERSION;  Surgeon: Sheena Pugh, DO;  Location: [facility] ENDOSCOPY;  Service: Cardiovascular;  Laterality: N/A;   CARDIOVERSION N/A 07/15/2021   Procedure: CARDIOVERSION;  Surgeon: Cindie Ole DASEN, MD;  Location: [facility] INVASIVE CV LAB;  Service: Cardiovascular;  Laterality: N/A;   CARDIOVERSION N/A 11/09/2023   Procedure: CARDIOVERSION;  Surgeon: Loni Soyla LABOR, MD;  Location: [facility] INVASIVE CV LAB;  Service: Cardiovascular;  Laterality: N/A;   CHOLECYSTECTOMY N/A 08/18/2016   Procedure: LAPAROSCOPIC CHOLECYSTECTOMY WITH  INTRAOPERATIVE CHOLANGIOGRAM;  Surgeon: Donnice Lunger, MD;  Location: [facility] ORS;  Service: General;  Laterality: N/A;   COLECTOMY WITH COLOSTOMY CREATION/HARTMANN PROCEDURE N/A 06/18/2020   Procedure: SIGMOID COLECTOMY WITH COLOSTOMY CREATION; DRAINAGE OF PELVIC ABSCESS;  Surgeon: Ebbie Donnice, MD;  Location: [facility] OR;  Service: General;  Laterality: N/A;   CYSTOSCOPY WITH STENT PLACEMENT Bilateral 06/18/2020   Procedure: CYSTOSCOPY WITH STENT PLACEMENT;  Surgeon: Alvaro Hummer, MD;  Location: [facility] OR;  Service: Urology;  Laterality: Bilateral;   LAPAROTOMY N/A 06/18/2020   Procedure: EXPLORATORY LAPAROTOMY;  Surgeon: Ebbie Donnice, MD;  Location: [facility] OR;  Service: General;  Laterality: N/A;   LEFT HEART CATH AND CORONARY ANGIOGRAPHY N/A 12/25/2021   Procedure: LEFT HEART CATH AND CORONARY ANGIOGRAPHY;   Surgeon: Claudene Victory ORN, MD;  Location: [facility] INVASIVE CV LAB;  Service: Cardiovascular;  Laterality: N/A;   Family History  Problem Relation Age of Onset   Heart failure Mother    Breast cancer Mother    Atrial fibrillation Mother    Atrial fibrillation Father    Heart failure Father    Diabetes Father    Colon cancer Neg Hx    Stomach cancer Neg Hx    Esophageal cancer Neg Hx    Pancreatic cancer Neg Hx    Liver disease Neg Hx     Current Outpatient Medications:    apixaban  (ELIQUIS ) 5 MG TABS tablet, Take 1 tablet (5 mg total) by mouth 2 (two) times daily., Disp: 180 tablet, Rfl: 1   empagliflozin  (JARDIANCE ) 10 MG TABS tablet, Take 1 tablet (10 mg total) by mouth daily before breakfast., Disp: 90 tablet, Rfl: 2   ENTRESTO  24-26 MG, Take 1 tablet by mouth twice daily, Disp: 180 tablet, Rfl: 3   Magnesium  250 MG TABS, Take 250 mg by mouth daily., Disp: , Rfl:    metoprolol  succinate (TOPROL -XL) 50 MG 24 hr tablet, Take 1 tablet by mouth once daily, Disp: 90 tablet, Rfl: 3   rosuvastatin (CRESTOR) 5 MG tablet, Take 5 mg by mouth daily., Disp: , Rfl:    acetaminophen  (TYLENOL ) 500 MG tablet, Take 1,000 mg by mouth every 6 (six) hours as needed for mild pain, fever or headache. (Patient taking differently: Take 1,000  mg by mouth as needed for mild pain (pain score 1-3), fever or headache.), Disp: , Rfl:    apixaban  (ELIQUIS ) 5 MG TABS tablet, Take 1 tablet (5 mg total) by mouth 2 (two) times daily. (Patient not taking: Reported on 04/11/2024), Disp: 28 tablet, Rfl: 0   furosemide  (LASIX ) 20 MG tablet, Take 1 tablet (20 mg total) by mouth daily as needed for fluid or edema (As needed for weight gain of 3 lb overnight or 5 lb in one week). (Patient not taking: Reported on 04/11/2024), Disp: 90 tablet, Rfl: 2   LORazepam  (ATIVAN ) 0.5 MG tablet, Take 0.5 mg 30 to 60 min before Cardiac MRI (Patient not taking: Reported on 04/11/2024), Disp: 1 tablet, Rfl: 0   spironolactone  (ALDACTONE ) 25 MG  tablet, Take 0.5 tablets (12.5 mg total) by mouth daily. (Patient not taking: Reported on 04/11/2024), Disp: 90 tablet, Rfl: 3   Vitamin D , Ergocalciferol , (DRISDOL ) 1.25 MG (50000 UNIT) CAPS capsule, Take 1 capsule (50,000 Units total) by mouth once a week., Disp: 12 capsule, Rfl: 1 Allergies  Allergen Reactions   Heparin  Other (See Comments)    06/23/20 HIT AB ODT =3 very high likelihood of true HIT; 2/7 SRA negative     ROS: A complete ROS was performed with pertinent positives/negatives noted in the HPI. The remainder of the ROS are negative.    Objective:   Today's Vitals   04/11/24 0841  BP: 132/80  Pulse: 72  Temp: 97.8 F (36.6 C)  TempSrc: Temporal  SpO2: 97%  Weight: 268 lb 6.4 oz (121.7 kg)  Height: 5' 8 (1.727 m)    GENERAL: Well-appearing, in NAD. Well nourished.  SKIN: Pink, warm and dry. No rash, lesion, ulceration, or ecchymoses.  HEENT:    HEAD: Normocephalic, non-traumatic.  NOSE: Septum midline w/o deformity. Nares patent, mucosa pink and inflamed w/o drainage. No sinus tenderness.  NECK: Trachea midline. Full ROM w/o pain or tenderness. No lymphadenopathy.  RESPIRATORY: Chest wall symmetrical. Respirations even and non-labored. Breath sounds clear to auscultation bilaterally.  CARDIAC: S1, S2 present, regular rate and rhythm. Peripheral pulses 2+ bilaterally.  EXTREMITIES: Without clubbing, cyanosis, or edema.  NEUROLOGIC: Steady, even gait.  PSYCH/MENTAL STATUS: Alert, oriented x 3. Cooperative, appropriate mood and affect.   Health Maintenance Due  Topic Date Due   Mammogram  Never done   Pneumococcal Vaccine: 50+ Years (1 of 2 - PCV) Never done   Cervical Cancer Screening (HPV/Pap Cotest)  Never done   Influenza Vaccine  12/17/2023     The ASCVD Risk score (Arnett DK, et al., 2019) failed to calculate for the following reasons:   Cannot find a previous HDL lab   Cannot find a previous total cholesterol lab     Assessment & Plan:  Assessment  and Plan    Essential hypertension Blood pressure slightly elevated due to recent Sudafed use. ] - Continue current antihypertensive regimen.  Nasal congestion - Consider Coricidin HBP for congestion.  Vitamin D  deficiency Vitamin D  levels require monitoring to maintain safe range. - Refilled vitamin D  prescription. - Checked vitamin D  level today.  Obstructive sleep apnea Inconsistent CPAP use - Encouraged consistent CPAP use.  Screening mammogram for breast cancer Mammogram not completed; mobile unit available for scheduling. - Ordered mobile mammogram      Orders Placed This Encounter  Procedures   MM 3D SCREENING MAMMOGRAM BILATERAL BREAST    INS: [employer] NEXT PF: 2002?    Standing Status:   Future  Expiration Date:   04/11/2025    Reason for Exam (SYMPTOM  OR DIAGNOSIS REQUIRED):   screening for breast cancer    Preferred imaging location?:   [facility]   VITAMIN D  25 Hydroxy (Vit-D Deficiency, Fractures)   No images are attached to the encounter or orders placed in the encounter. Meds ordered this encounter  Medications   Vitamin D , Ergocalciferol , (DRISDOL ) 1.25 MG (50000 UNIT) CAPS capsule    Sig: Take 1 capsule (50,000 Units total) by mouth once a week.    Dispense:  12 capsule    Refill:  1    Return in about 6 months (around 10/09/2024) for Chronic Condition follow up - fasting lab work .   Rosina Senters, FNP

## 2024-04-12 ENCOUNTER — Encounter (HOSPITAL_COMMUNITY): Payer: Self-pay

## 2024-04-18 ENCOUNTER — Ambulatory Visit (HOSPITAL_COMMUNITY): Admission: RE | Admit: 2024-04-18 | Source: Ambulatory Visit

## 2024-04-19 ENCOUNTER — Ambulatory Visit: Payer: Self-pay | Admitting: Family

## 2024-04-19 NOTE — Telephone Encounter (Signed)
 Patient has been notified directly; all questions, if any, were answered. Patient voiced understanding.  Started taking vitamin D 

## 2024-04-20 ENCOUNTER — Telehealth: Payer: Self-pay | Admitting: Pharmacy Technician

## 2024-04-20 NOTE — Telephone Encounter (Signed)
   Pharmacy Patient Advocate Encounter   Received notification from CoverMyMeds that prior authorization for entresto  is required/requested.   Insurance verification completed.   The patient is insured through [facility] MEDICAID.   Per test claim: PA required; PA submitted to above mentioned insurance via Latent Key/confirmation #/[id number] [facility] Status is pending

## 2024-04-24 ENCOUNTER — Other Ambulatory Visit (HOSPITAL_COMMUNITY): Payer: Self-pay

## 2024-04-24 MED ORDER — SACUBITRIL-VALSARTAN 24-26 MG PO TABS: 1.0000 | ORAL_TABLET | Freq: Two times a day (BID) | ORAL | 3 refills | Status: DC

## 2024-04-24 NOTE — Addendum Note (Signed)
 Addended by: VICCI ROXIE CROME on: 04/24/2024 09:26 AM   Modules accepted: Orders

## 2024-04-24 NOTE — Telephone Encounter (Signed)
 Spoke with patient, shared the message below from our pharmacy tech regarding Entresto  prescription.  Generic prescription sent to [facility]. Patient verbalized understanding and expressed appreciation for call.

## 2024-04-24 NOTE — Telephone Encounter (Signed)
 Insurance denied the brand entresto  prior authorization. The prescription is currently written as dispense as written brand. The insurance will pay for generic, the generic would be free on her insurance. Can she be switched to generic? If so, can the generic prescription please be sent to her pharmacy? Thank you

## 2024-05-15 ENCOUNTER — Other Ambulatory Visit: Payer: Self-pay

## 2024-05-15 MED ORDER — APIXABAN 5 MG PO TABS: 5.0000 mg | ORAL_TABLET | Freq: Two times a day (BID) | ORAL | 1 refills | Status: AC

## 2024-05-15 NOTE — Telephone Encounter (Signed)
 Prescription refill request for Eliquis  received. Indication:AFIB Last office visit:11/25 Scr: 0.99  11/25 Age:63 Weight:121.7  kg  Prescription refilled

## 2024-05-23 ENCOUNTER — Telehealth: Payer: Self-pay | Admitting: Cardiology

## 2024-05-23 NOTE — Telephone Encounter (Signed)
 Yes okay to use generic version

## 2024-05-23 NOTE — Telephone Encounter (Signed)
 Called pharmacy. Pharmacy staff verified the prescription states no substitutions per doctor's request. I was unable to see that comment on our end. Staff requested a new prescription be sent in for the generic if okay with provider.

## 2024-05-23 NOTE — Telephone Encounter (Signed)
 Pt c/o medication issue:  1. Name of Medication:   sacubitril -valsartan  (ENTRESTO ) 24-26 MG   2. How are you currently taking this medication (dosage and times per day)?   3. Are you having a reaction (difficulty breathing--STAT)?   4. What is your medication issue?   Caller (Drai) stated patient's insurance prefers the generic brand and doctor's prescription states brand only.  Caller wants to know if they can dispense generic brand.

## 2024-05-24 ENCOUNTER — Other Ambulatory Visit (HOSPITAL_COMMUNITY): Payer: Self-pay

## 2024-05-24 ENCOUNTER — Telehealth: Payer: Self-pay | Admitting: Pharmacy Technician

## 2024-05-24 NOTE — Telephone Encounter (Signed)
" ° °  Insurance will pay for generic 50.00 for 1  month. Another note where office to send generic again to [facility]  "

## 2024-05-25 MED ORDER — SACUBITRIL-VALSARTAN 24-26 MG PO TABS: 1.0000 | ORAL_TABLET | Freq: Two times a day (BID) | ORAL | 3 refills | Status: AC

## 2024-05-25 NOTE — Telephone Encounter (Signed)
 Resent the prescription to pt's pharmacy.  Called pharmacy, prescription went through. Pt has a $50 copay for the medication.

## 2024-06-15 ENCOUNTER — Ambulatory Visit: Payer: Self-pay | Admitting: Cardiology

## 2024-09-19 ENCOUNTER — Ambulatory Visit: Payer: Self-pay | Admitting: Cardiology

## 2024-10-10 ENCOUNTER — Ambulatory Visit: Admitting: Internal Medicine
